# Patient Record
Sex: Male | Born: 1935
Health system: Southern US, Community
[De-identification: ages and names within clinical notes are randomized; demographics above are authoritative.]

## PROBLEM LIST (undated history)

## (undated) ENCOUNTER — Emergency Department (HOSPITAL_BASED_OUTPATIENT_CLINIC_OR_DEPARTMENT_OTHER): Admission: EM | Payer: Medicare PPO | Source: Home / Self Care

## (undated) DIAGNOSIS — E7800 Pure hypercholesterolemia, unspecified: Secondary | ICD-10-CM

## (undated) DIAGNOSIS — M8580 Other specified disorders of bone density and structure, unspecified site: Secondary | ICD-10-CM

## (undated) DIAGNOSIS — D6859 Other primary thrombophilia: Secondary | ICD-10-CM

## (undated) DIAGNOSIS — I4891 Unspecified atrial fibrillation: Secondary | ICD-10-CM

## (undated) DIAGNOSIS — M316 Other giant cell arteritis: Secondary | ICD-10-CM

## (undated) DIAGNOSIS — I712 Thoracic aortic aneurysm, without rupture, unspecified: Secondary | ICD-10-CM

## (undated) DIAGNOSIS — I700 Atherosclerosis of aorta: Secondary | ICD-10-CM

## (undated) HISTORY — PX: APPENDECTOMY: SHX54

## (undated) HISTORY — PX: HERNIA REPAIR: SHX51

## (undated) HISTORY — DX: Thoracic aortic aneurysm, without rupture: I71.2

---

## 1999-06-16 ENCOUNTER — Encounter (INDEPENDENT_AMBULATORY_CARE_PROVIDER_SITE_OTHER): Payer: Self-pay | Admitting: Specialist

## 1999-06-16 ENCOUNTER — Ambulatory Visit (HOSPITAL_COMMUNITY): Admission: RE | Admit: 1999-06-16 | Discharge: 1999-06-16 | Payer: Self-pay | Admitting: Gastroenterology

## 1999-12-22 ENCOUNTER — Emergency Department (HOSPITAL_COMMUNITY): Admission: EM | Admit: 1999-12-22 | Discharge: 1999-12-22 | Payer: Self-pay | Admitting: Emergency Medicine

## 1999-12-22 ENCOUNTER — Encounter: Payer: Self-pay | Admitting: Emergency Medicine

## 2007-11-13 ENCOUNTER — Emergency Department (HOSPITAL_COMMUNITY): Admission: EM | Admit: 2007-11-13 | Discharge: 2007-11-13 | Payer: Self-pay | Admitting: Emergency Medicine

## 2009-05-29 ENCOUNTER — Ambulatory Visit (HOSPITAL_COMMUNITY): Admission: RE | Admit: 2009-05-29 | Discharge: 2009-05-29 | Payer: Self-pay | Admitting: Cardiology

## 2010-09-17 NOTE — Op Note (Signed)
[place]. [facility]  Patient:    Nathan Mitchell, Nathan Mitchell                     MRN: [id number] Proc. Date: 12/22/99 Adm. Date:  [id number] Attending:  Sandi Raveling CC:         Nicki Reaper, M.D.   Operative Report  PREOPERATIVE DIAGNOSIS:  POSTOPERATIVE DIAGNOSIS:  OPERATION PERFORMED:  SURGEON:  Nicki Reaper, M.D.  ASSISTANT:  ANESTHESIA:  INDICATIONS FOR PROCEDURE:   The patient is a 77 year old male who suffered a laceration over the dorsal aspect of his index, middle and ring fingers of his right hand when a piece of pottery broke.  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  DESCRIPTION OF PROCEDURE:  The patient was given metacarpal block with 2% Xylocaine without epinephrine.  10 cc was used.  He was prepped and draped using Betadine solution.  Penrose drain was used for tourniquet control at the base of each finger.  The index was an avulsion of skin.  This was ____________ debridement.  The middle finger was attended to next.  There was a slight abrasion of the extensor tendon which lay in good position with approximately 50 to 60% intact.  This was irrigated.  Skin closed with interrupted 5-0 nylon sutures.  A 3-5 K-wire was placed into the ring finger after irrigation and debridement of the joint of several loose pieces of pottery.  The 3-5 K-wire was inserted pinning the distal interphalangeal joint full extension.  The extensor tendon was then repaired with figure-of-eight 5-0 M[place]il[place] sutures.  The skin was repaired with interrupted 5-0 nylon suture.  A small incision was made on the midlateral line to extend the incision for better visualization.  The entire wound was closed with interrupted 5-0 nylon.  Sterile compressive dressing and splints were applied. The patient tolerated the procedure well and was discharged home to return to the [facility] in one week on  Tylenol 3 and Keflex.  He was given a tetanus prior to discharge. DD:  12/22/99 TD:  12/23/99 Job: [id number]

## 2011-05-25 ENCOUNTER — Other Ambulatory Visit (HOSPITAL_COMMUNITY): Payer: Self-pay | Admitting: Geriatric Medicine

## 2011-05-25 DIAGNOSIS — R0600 Dyspnea, unspecified: Secondary | ICD-10-CM

## 2011-05-31 ENCOUNTER — Ambulatory Visit (HOSPITAL_COMMUNITY)
Admission: RE | Admit: 2011-05-31 | Discharge: 2011-05-31 | Disposition: A | Discharge status: Home / Self Care | Payer: Medicare Other | Source: Ambulatory Visit | Attending: Geriatric Medicine | Admitting: Geriatric Medicine

## 2011-05-31 DIAGNOSIS — R0989 Other specified symptoms and signs involving the circulatory and respiratory systems: Secondary | ICD-10-CM | POA: Insufficient documentation

## 2011-05-31 DIAGNOSIS — R0609 Other forms of dyspnea: Secondary | ICD-10-CM | POA: Insufficient documentation

## 2011-05-31 MED ORDER — ALBUTEROL SULFATE (5 MG/ML) 0.5% IN NEBU
2.5000 mg | INHALATION_SOLUTION | Freq: Once | RESPIRATORY_TRACT | Status: AC
  Administered 2011-05-31: 2.5 mg via RESPIRATORY_TRACT

## 2012-04-29 ENCOUNTER — Emergency Department (HOSPITAL_COMMUNITY): Admission: EM | Admit: 2012-04-29 | Discharge: 2012-04-29 | Payer: BC Managed Care – PPO | Source: Home / Self Care

## 2012-04-29 DIAGNOSIS — H547 Unspecified visual loss: Secondary | ICD-10-CM | POA: Insufficient documentation

## 2012-04-29 NOTE — ED Notes (Signed)
Patient came in today with verbal orders from Dr Eulah Pont at [facility] for a ESR a C-reactive protein.  I called Dr Eulah Pont.  He stated patient did not need to see a physician.  I explained to physician if he could fax me a written order patient could go directly to lab instead of seeing one of our physicians.  Order was faxed to [phone].  I then faxed order to lab at [phone] and sent patient with copy to lab.

## 2012-06-07 DIAGNOSIS — M316 Other giant cell arteritis: Secondary | ICD-10-CM | POA: Insufficient documentation

## 2012-07-03 ENCOUNTER — Other Ambulatory Visit: Payer: Self-pay | Admitting: Geriatric Medicine

## 2012-07-03 ENCOUNTER — Ambulatory Visit
Admission: RE | Admit: 2012-07-03 | Discharge: 2012-07-03 | Disposition: A | Discharge status: Home / Self Care | Payer: Medicare Other | Source: Ambulatory Visit | Attending: Geriatric Medicine | Admitting: Geriatric Medicine

## 2012-07-03 DIAGNOSIS — R609 Edema, unspecified: Secondary | ICD-10-CM

## 2013-01-15 DIAGNOSIS — E785 Hyperlipidemia, unspecified: Secondary | ICD-10-CM | POA: Insufficient documentation

## 2013-05-28 DIAGNOSIS — Z79899 Other long term (current) drug therapy: Secondary | ICD-10-CM | POA: Insufficient documentation

## 2013-06-03 DIAGNOSIS — H26499 Other secondary cataract, unspecified eye: Secondary | ICD-10-CM | POA: Insufficient documentation

## 2013-06-03 DIAGNOSIS — H47013 Ischemic optic neuropathy, bilateral: Secondary | ICD-10-CM | POA: Insufficient documentation

## 2013-07-01 ENCOUNTER — Other Ambulatory Visit (HOSPITAL_COMMUNITY): Payer: Self-pay | Admitting: Geriatric Medicine

## 2013-07-01 ENCOUNTER — Encounter: Payer: Self-pay | Admitting: Cardiovascular Disease

## 2013-07-01 ENCOUNTER — Ambulatory Visit (HOSPITAL_COMMUNITY): Payer: Medicare Other | Attending: Cardiovascular Disease | Admitting: Radiology

## 2013-07-01 DIAGNOSIS — R0602 Shortness of breath: Secondary | ICD-10-CM

## 2013-07-01 DIAGNOSIS — R0609 Other forms of dyspnea: Secondary | ICD-10-CM | POA: Insufficient documentation

## 2013-07-01 DIAGNOSIS — R0989 Other specified symptoms and signs involving the circulatory and respiratory systems: Principal | ICD-10-CM | POA: Insufficient documentation

## 2013-07-01 NOTE — Progress Notes (Signed)
Echocardiogram performed.  

## 2014-02-25 DIAGNOSIS — Z79899 Other long term (current) drug therapy: Secondary | ICD-10-CM | POA: Insufficient documentation

## 2014-07-01 DIAGNOSIS — H02005 Unspecified entropion of left lower eyelid: Secondary | ICD-10-CM | POA: Insufficient documentation

## 2015-06-07 ENCOUNTER — Emergency Department (EMERGENCY_DEPARTMENT_HOSPITAL): Admit: 2015-06-07 | Discharge: 2015-06-07 | Disposition: A | Discharge status: Home / Self Care | Payer: Medicare Other

## 2015-06-07 ENCOUNTER — Emergency Department (HOSPITAL_COMMUNITY): Payer: Medicare Other

## 2015-06-07 ENCOUNTER — Encounter (HOSPITAL_COMMUNITY): Payer: Self-pay | Admitting: Emergency Medicine

## 2015-06-07 ENCOUNTER — Emergency Department (HOSPITAL_COMMUNITY)
Admission: EM | Admit: 2015-06-07 | Discharge: 2015-06-07 | Disposition: A | Discharge status: Home / Self Care | Payer: Medicare Other | Attending: Emergency Medicine | Admitting: Emergency Medicine

## 2015-06-07 DIAGNOSIS — Z79899 Other long term (current) drug therapy: Secondary | ICD-10-CM | POA: Diagnosis not present

## 2015-06-07 DIAGNOSIS — M79609 Pain in unspecified limb: Secondary | ICD-10-CM

## 2015-06-07 DIAGNOSIS — Z87891 Personal history of nicotine dependence: Secondary | ICD-10-CM | POA: Insufficient documentation

## 2015-06-07 DIAGNOSIS — M7989 Other specified soft tissue disorders: Secondary | ICD-10-CM

## 2015-06-07 DIAGNOSIS — M79661 Pain in right lower leg: Secondary | ICD-10-CM | POA: Diagnosis not present

## 2015-06-07 DIAGNOSIS — Z7982 Long term (current) use of aspirin: Secondary | ICD-10-CM | POA: Diagnosis not present

## 2015-06-07 DIAGNOSIS — M316 Other giant cell arteritis: Secondary | ICD-10-CM | POA: Insufficient documentation

## 2015-06-07 DIAGNOSIS — M25561 Pain in right knee: Secondary | ICD-10-CM | POA: Diagnosis present

## 2015-06-07 HISTORY — DX: Other giant cell arteritis: M31.6

## 2015-06-07 MED ORDER — HYDROCODONE-ACETAMINOPHEN 5-325 MG PO TABS: 1.0000 | ORAL_TABLET | Freq: Four times a day (QID) | ORAL | Status: DC | PRN

## 2015-06-07 NOTE — ED Notes (Signed)
Patient transported to X-ray 

## 2015-06-07 NOTE — ED Notes (Signed)
Pt found fully clothes and ready to go upon DC.  Family and Pt refused DC vital signs.

## 2015-06-07 NOTE — ED Notes (Signed)
Bed: WA21 Expected date:  Expected time:  Means of arrival:  Comments: TR 8 

## 2015-06-07 NOTE — ED Notes (Addendum)
Pt with Hx of giant cell arteritis c/o right lower leg pain x several days, worse last night. No SOB, CP. Pt worried about blood clot. Positive Homan's right posterior knee.

## 2015-06-07 NOTE — Discharge Instructions (Signed)
Pain Without a Known Cause °WHAT IS PAIN WITHOUT A KNOWN CAUSE? °Pain can occur in any part of the body and can range from mild to severe. Sometimes no cause can be found for why you are having pain. Some types of pain that can occur without a known cause include:  °· Headache. °· Back pain. °· Abdominal pain. °· Neck pain. °HOW IS PAIN WITHOUT A KNOWN CAUSE DIAGNOSED?  °Your health care provider will try to find the cause of your pain. This may include: °· Physical exam. °· Medical history. °· Blood tests. °· Urine tests. °· X-rays. °If no cause is found, your health care provider may diagnose you with pain without a known cause.  °IS THERE TREATMENT FOR PAIN WITHOUT A CAUSE?  °Treatment depends on the kind of pain you have. Your health care provider may prescribe medicines to help relieve your pain.  °WHAT CAN I DO AT HOME FOR MY PAIN?  °· Take medicines only as directed by your health care provider. °· Stop any activities that cause pain. During periods of severe pain, bed rest may help. °· Try to reduce your stress with activities such as yoga or meditation. Talk to your health care provider for other stress-reducing activity recommendations. °· Exercise regularly, if approved by your health care provider. °· Eat a healthy diet that includes fruits and vegetables. This may improve pain. Talk to your health care provider if you have any questions about your diet. °WHAT IF MY PAIN DOES NOT GET BETTER?  °If you have a painful condition and no reason can be found for the pain or the pain gets worse, it is important to follow up with your health care provider. It may be necessary to repeat tests and look further for a possible cause.  °  °This information is not intended to replace advice given to you by your health care provider. Make sure you discuss any questions you have with your health care provider. °  °Document Released: 01/11/2001 Document Revised: 05/09/2014 Document Reviewed: 09/03/2013 °Elsevier  Interactive Patient Education ©2016 [employer]vier Inc. ° °

## 2015-06-07 NOTE — ED Provider Notes (Signed)
CSN: [id number]     Arrival date & time 06/07/15  1619 History   First MD Initiated Contact with Patient 06/07/15 1809     Chief Complaint  Patient presents with  . Leg Pain     (Consider location/radiation/quality/duration/timing/severity/associated sxs/prior Treatment) Patient is a 82 y.o. male presenting with leg pain. The history is provided by the patient.  Leg Pain Location:  Knee and leg Time since incident:  3 days Injury: no   Leg location:  R lower leg Knee location:  R knee Pain details:    Quality:  Aching, shooting and throbbing   Radiates to:  Does not radiate   Severity:  Moderate   Onset quality:  Gradual   Duration:  3 days   Timing:  Constant   Progression:  Worsening Chronicity:  New Prior injury to area:  No Relieved by:  None tried Worsened by:  Bearing weight Ineffective treatments:  None tried Associated symptoms: no fatigue, no fever, no numbness and no swelling   Risk factors comment:  History of giant cell arteritis. Currently taking methotrexate but no longer on prednisone as of the last month   Past Medical History  Diagnosis Date  . Giant cell arteritis (HCC)    History reviewed. No pertinent past surgical history. History reviewed. No pertinent family history. Social History  Substance Use Topics  . Smoking status: Former Games developer  . Smokeless tobacco: None  . Alcohol Use: 1.8 oz/week    3 Glasses of wine per week    Review of Systems  Constitutional: Negative for fever and fatigue.  All other systems reviewed and are negative.     Allergies  Review of patient's allergies indicates no known allergies.  Home Medications   Prior to Admission medications   Medication Sig Start Date End Date Taking? Authorizing Provider  aspirin EC 81 MG tablet Take 81 mg by mouth daily.   Yes Historical Provider, MD  atorvastatin (LIPITOR) 10 MG tablet Take 10 mg by mouth daily.   Yes Historical Provider, MD  Cholecalciferol (VITAMIN D-1000 MAX  ST) 1000 units tablet Take 1,000 Units by mouth daily.   Yes Historical Provider, MD  cyanocobalamin ([employer] VITAMIN B12) 2000 MCG tablet Take 2,000 mcg by mouth daily.   Yes Historical Provider, MD  escitalopram (LEXAPRO) 20 MG tablet Take 20 mg by mouth daily.   Yes Historical Provider, MD  ferrous sulfate 325 (65 FE) MG tablet Take 325 mg by mouth 3 (three) times a week. Monday, Wednesday, Friday   Yes Historical Provider, MD  folic acid (FOLVITE) 800 MCG tablet Take 400 mcg by mouth daily. 12/02/14  Yes Historical Provider, MD  methotrexate (RHEUMATREX) 2.5 MG tablet Take 20 mg by mouth once a week. Friday 04/24/15  Yes Historical Provider, MD  omeprazole (PRILOSEC) 20 MG capsule Take 20 mg by mouth daily.   Yes Historical Provider, MD  HYDROcodone-acetaminophen (NORCO/VICODIN) 5-325 MG tablet Take 1 tablet by mouth every 6 (six) hours as needed. 06/07/15   Gwyneth Sprout, MD   BP 145/80 mmHg  Pulse 69  Temp(Src) 97.4 F (36.3 C) (Oral)  Resp 16  SpO2 96% Physical Exam  Constitutional: He is oriented to person, place, and time. He appears well-developed and well-nourished. No distress.  HENT:  Head: Normocephalic and atraumatic.  Cardiovascular: Normal rate and intact distal pulses.   Pulmonary/Chest: Effort normal.  Musculoskeletal: Normal range of motion. He exhibits tenderness. He exhibits no edema.       Right knee: He  exhibits normal range of motion, no swelling, no effusion and no erythema.       Legs: Neurological: He is alert and oriented to person, place, and time.  Skin: Skin is warm and dry. No rash noted. No erythema.  Psychiatric: He has a normal mood and affect. His behavior is normal.  Nursing note and vitals reviewed.   ED Course  Procedures (including critical care time) Labs Review Labs Reviewed - No data to display  Imaging Review Dg Knee Complete 4 Views Right  06/07/2015  CLINICAL DATA:  Right knee pain for several days.  Worsening today. EXAM: RIGHT KNEE -  COMPLETE 4+ VIEW COMPARISON:  None. FINDINGS: There is no evidence of fracture, dislocation, or joint effusion. 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 There is peripheral vascular atherosclerotic disease. Soft tissues are unremarkable. IMPRESSION: No acute osseous injury of the right knee. Electronically Signed   By: Elige Ko   On: 06/07/2015 20:16   I have personally reviewed and evaluated these images and lab results as part of my medical decision-making.  PRELIMINARY PRELIMINARY PRELIMINARY PRELIMINARY  Right lower extremity venous duplex completed.   Preliminary report: There is no acute DVT or SVT noted in the right lower extremity. There is a large hypoechoic area noted in the proximal to mid calf, possibly consistent with a muscle tear.   MDM   Final diagnoses:  Right calf pain    Patient presenting with worsening pain in the right proximal calf and knee that started 3 days ago. Patient has a history of giant cell arteritis and is currently on methotrexate. He denies any recent fevers, redness or drainage from the leg. No prior history of DVT or recent immobilization.  On exam patient has no dilated veins or cords palpated. No induration or concern for abscess. Full range of motion of the knee without evidence of a septic joint.  X-rays within normal limits. Venous duplex to rule out DVT is negative for DVT but shows subcutaneous fluid and signs consistent with a muscle tear. Patient cannot recall any injury however he has been on long-term prednisone and may have done something minor that caused the issue.    Gwyneth Sprout, MD 06/07/15 2136

## 2015-06-07 NOTE — Progress Notes (Signed)
VASCULAR LAB PRELIMINARY  PRELIMINARY  PRELIMINARY  PRELIMINARY  Right lower extremity venous duplex completed.    Preliminary report:  There is no acute DVT or SVT noted in the right lower extremity.  There is a large hypoechoic area noted in the proximal to mid calf, possibly consistent with a muscle tear.   Tanessa Tidd, RVT 06/07/2015, 7:22 PM

## 2015-07-28 ENCOUNTER — Other Ambulatory Visit: Payer: Self-pay | Admitting: Geriatric Medicine

## 2015-07-28 ENCOUNTER — Ambulatory Visit
Admission: RE | Admit: 2015-07-28 | Discharge: 2015-07-28 | Disposition: A | Discharge status: Home / Self Care | Payer: Medicare Other | Source: Ambulatory Visit | Attending: Geriatric Medicine | Admitting: Geriatric Medicine

## 2015-07-28 DIAGNOSIS — J440 Chronic obstructive pulmonary disease with acute lower respiratory infection: Principal | ICD-10-CM

## 2015-07-28 DIAGNOSIS — J209 Acute bronchitis, unspecified: Secondary | ICD-10-CM

## 2017-12-16 ENCOUNTER — Other Ambulatory Visit: Payer: Self-pay

## 2017-12-16 ENCOUNTER — Encounter (HOSPITAL_COMMUNITY): Payer: Self-pay | Admitting: Emergency Medicine

## 2017-12-16 ENCOUNTER — Emergency Department (HOSPITAL_COMMUNITY)
Admission: EM | Admit: 2017-12-16 | Discharge: 2017-12-16 | Disposition: A | Discharge status: Home / Self Care | Payer: Medicare Other | Attending: Emergency Medicine | Admitting: Emergency Medicine

## 2017-12-16 DIAGNOSIS — I4891 Unspecified atrial fibrillation: Secondary | ICD-10-CM | POA: Insufficient documentation

## 2017-12-16 DIAGNOSIS — Z87891 Personal history of nicotine dependence: Secondary | ICD-10-CM | POA: Insufficient documentation

## 2017-12-16 DIAGNOSIS — Z79899 Other long term (current) drug therapy: Secondary | ICD-10-CM | POA: Insufficient documentation

## 2017-12-16 DIAGNOSIS — T50901A Poisoning by unspecified drugs, medicaments and biological substances, accidental (unintentional), initial encounter: Secondary | ICD-10-CM

## 2017-12-16 LAB — CBC
HCT: 41.2 % (ref 39.0–52.0)
Hemoglobin: 12.8 g/dL — ABNORMAL LOW (ref 13.0–17.0)
MCH: 30.8 pg (ref 26.0–34.0)
MCHC: 31.1 g/dL (ref 30.0–36.0)
MCV: 99.3 fL (ref 78.0–100.0)
Platelets: 213 10*3/uL (ref 150–400)
RBC: 4.15 MIL/uL — ABNORMAL LOW (ref 4.22–5.81)
RDW: 15.8 % — ABNORMAL HIGH (ref 11.5–15.5)
WBC: 5.7 10*3/uL (ref 4.0–10.5)

## 2017-12-16 LAB — COMPREHENSIVE METABOLIC PANEL
ALT: 16 U/L (ref 0–44)
AST: 15 U/L (ref 15–41)
Albumin: 3.8 g/dL (ref 3.5–5.0)
Alkaline Phosphatase: 44 U/L (ref 38–126)
Anion gap: 6 (ref 5–15)
BUN: 19 mg/dL (ref 8–23)
CO2: 27 mmol/L (ref 22–32)
Calcium: 9 mg/dL (ref 8.9–10.3)
Chloride: 108 mmol/L (ref 98–111)
Creatinine, Ser: 1.38 mg/dL — ABNORMAL HIGH (ref 0.61–1.24)
GFR calc Af Amer: 52 mL/min — ABNORMAL LOW (ref >60)
GFR calc non Af Amer: 45 mL/min — ABNORMAL LOW (ref >60)
Glucose, Bld: 127 mg/dL — ABNORMAL HIGH (ref 70–99)
Potassium: 4.3 mmol/L (ref 3.5–5.1)
Sodium: 141 mmol/L (ref 135–145)
Total Bilirubin: 1 mg/dL (ref 0.3–1.2)
Total Protein: 6.6 g/dL (ref 6.5–8.1)

## 2017-12-16 LAB — CBG MONITORING, ED: Glucose-Capillary: 73 mg/dL (ref 70–99)

## 2017-12-16 LAB — SALICYLATE LEVEL: Salicylate Lvl: <7 mg/dL (ref 2.8–30.0)

## 2017-12-16 LAB — ACETAMINOPHEN LEVEL: Acetaminophen (Tylenol), Serum: <10 ug/mL — ABNORMAL LOW (ref 10–30)

## 2017-12-16 LAB — RAPID URINE DRUG SCREEN, HOSP PERFORMED
Amphetamines: NOT DETECTED
Barbiturates: NOT DETECTED
Benzodiazepines: NOT DETECTED
Cocaine: NOT DETECTED
Opiates: NOT DETECTED
Tetrahydrocannabinol: NOT DETECTED

## 2017-12-16 LAB — ETHANOL: Alcohol, Ethyl (B): <10 mg/dL (ref <10)

## 2017-12-16 LAB — METHOTREXATE: Methotrexate: 1.5

## 2017-12-16 MED ORDER — RIVAROXABAN 20 MG PO TABS: 20.0000 mg | ORAL_TABLET | Freq: Every day | ORAL | 0 refills | Status: DC

## 2017-12-16 MED ORDER — LEUCOVORIN CALCIUM INJECTION 200 MG
50.0000 mg | Freq: Once | INTRAMUSCULAR | Status: AC
  Administered 2017-12-16: 50 mg via INTRAVENOUS
  Filled 2017-12-16: qty 2.5

## 2017-12-16 MED ORDER — SODIUM CHLORIDE 0.9 % IV BOLUS
1000.0000 mL | Freq: Once | INTRAVENOUS | Status: AC
  Administered 2017-12-16: 1000 mL via INTRAVENOUS

## 2017-12-16 MED ORDER — ONDANSETRON 4 MG PO TBDP
4.0000 mg | ORAL_TABLET | Freq: Once | ORAL | Status: AC
  Administered 2017-12-16: 4 mg via ORAL
  Filled 2017-12-16: qty 1

## 2017-12-16 MED ORDER — RIVAROXABAN 20 MG PO TABS
20.0000 mg | ORAL_TABLET | Freq: Once | ORAL | Status: AC
  Administered 2017-12-16: 20 mg via ORAL
  Filled 2017-12-16: qty 1

## 2017-12-16 MED ORDER — LEUCOVORIN CALCIUM INJECTION 350 MG: 100.0000 mg/m2 | Freq: Once | INTRAVENOUS | Status: DC

## 2017-12-16 MED ORDER — CHARCOAL ACTIVATED PO LIQD
50.0000 g | Freq: Once | ORAL | Status: AC
  Administered 2017-12-16: 50 g via ORAL
  Filled 2017-12-16: qty 240

## 2017-12-16 NOTE — ED Triage Notes (Signed)
Poison Control called regarding patient coming in POV after accidentally taking twice as much of his normal weekly methotrexate dose today at 9:15am. They reported that a new health aide filled his medication container, putting 16 tablets instead of 8 tablets per his usual. Their MD recommended EKG and methotrexate level be done as well as need for specific medications. Will consult with provider r/e medications when patient moved to treatment room.

## 2017-12-16 NOTE — ED Notes (Signed)
Gave pt urine specimen pack and he is now gone to restroom to fill

## 2017-12-16 NOTE — ED Notes (Signed)
Pt CBG 73.

## 2017-12-16 NOTE — ED Notes (Signed)
D/c reviewed with patient and daughter. PT verbalized understanding

## 2017-12-16 NOTE — ED Provider Notes (Signed)
[facility]   CSN: [id number] Arrival date & time: 12/16/17  1032     History   Chief Complaint Chief Complaint  Patient presents with  . Drug Overdose    HPI Nathan Mitchell is a 84 y.o. male with history of giant cell arteritis on weekly methotrexate who presents following accidental methotrexate overdose. He and family report that a new tech prepared his medications and her took 16 tablets instead of 8. His wife called poison control who advised EKG and methotrexate level. Patient is asymptomatic. 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  He denies any chest pain, shortness of breath, abdominal pain, nausea, vomiting, urinary symptoms.  HPI  Past Medical History:  Diagnosis Date  . Giant cell arteritis (HCC)     There are no active problems to display for this patient.   History reviewed. No pertinent surgical history.      Home Medications    Prior to Admission medications   Medication Sig Start Date End Date Taking? Authorizing Provider  atorvastatin (LIPITOR) 10 MG tablet Take 10 mg by mouth daily.   Yes [provider]  cholecalciferol (VITAMIN D) 1000 units tablet Take 1,000 Units by mouth daily.   Yes [provider]  escitalopram (LEXAPRO) 20 MG tablet Take 20 mg by mouth daily.   Yes [provider]  ferrous gluconate (FERGON) 240 (27 FE) MG tablet Take 240 mg by mouth every Monday, Wednesday, and Friday.   Yes [provider]  folic acid (FOLVITE) 400 MCG tablet Take 400 mcg by mouth daily.  12/02/14  Yes [provider]  methotrexate (RHEUMATREX) 2.5 MG tablet Take 20 mg by mouth every Saturday.  04/24/15  Yes [provider]  Omeprazole 20 MG TBEC Take 20 mg by mouth daily.    Yes [provider]  vitamin B-12 (CYANOCOBALAMIN) 1000 MCG tablet Take 1,000 mcg by mouth daily.   Yes [provider]  HYDROcodone-acetaminophen  (NORCO/VICODIN) 5-325 MG tablet Take 1 tablet by mouth every 6 (six) hours as needed. Patient not taking: Reported on 12/16/2017 06/07/15   Gwyneth SproutPlunkett, Whitney, MD  rivaroxaban (XARELTO) 20 MG TABS tablet Take 1 tablet (20 mg total) by mouth daily with supper. 12/16/17   Emi HolesLaw, Bion Todorov M, PA-C    Family History No family history on file.  Social History Social History   Tobacco Use  . Smoking status: Former Smoker  Substance Use Topics  . Alcohol use: Yes    Alcohol/week: 3.0 standard drinks    Types: 3 Glasses of wine per week  . Drug use: No     Allergies   Patient has no known allergies.   Review of Systems Review of Systems  Constitutional: Negative for chills and fever.  HENT: Negative for facial swelling and sore throat.   Respiratory: Negative for shortness of breath.   Cardiovascular: Negative for chest pain.  Gastrointestinal: Negative for abdominal pain, nausea and vomiting.  Genitourinary: Negative for dysuria.  Musculoskeletal: Negative for back pain.  Skin: Negative for rash and wound.  Neurological: Negative for headaches.  Psychiatric/Behavioral: The patient is not nervous/anxious.      Physical Exam Updated Vital Signs BP (!) 153/88   Pulse 61   Temp 97.7 F (36.5 C) (Oral)   Resp 19   Ht 6\' 2"  (1.88 m)   Wt 89 kg   SpO2 96%   BMI 25.19 kg/m   Physical Exam  Constitutional: He appears  well-developed and well-nourished. No distress.  HENT:  Head: Normocephalic and atraumatic.  Mouth/Throat: Oropharynx is clear and moist. No oropharyngeal exudate.  Eyes: Pupils are equal, round, and reactive to light. Conjunctivae are normal. Right eye exhibits no discharge. Left eye exhibits no discharge. No scleral icterus.  Neck: Normal range of motion. Neck supple. No thyromegaly present.  Cardiovascular: Normal rate, regular rhythm, normal heart sounds and intact distal pulses. Exam reveals no gallop and no friction rub.  No murmur heard. Pulmonary/Chest:  Effort normal and breath sounds normal. No stridor. No respiratory distress. He has no wheezes. He has no rales.  Abdominal: Soft. Bowel sounds are normal. He exhibits no distension. There is no tenderness. There is no rebound and no guarding.  Musculoskeletal: He exhibits no edema.  Lymphadenopathy:    He has no cervical adenopathy.  Neurological: He is alert. Coordination normal.  Skin: Skin is warm and dry. No rash noted. He is not diaphoretic. No pallor.  Psychiatric: He has a normal mood and affect.  Nursing note and vitals reviewed.    ED Treatments / Results  Labs (all labs ordered are listed, but only abnormal results are displayed) Labs Reviewed  COMPREHENSIVE METABOLIC PANEL - Abnormal; Notable for the following components:      Result Value   Glucose, Bld 127 (*)    Creatinine, Ser 1.38 (*)    GFR calc non Af Amer 45 (*)    GFR calc Af Amer 52 (*)    All other components within normal limits  ACETAMINOPHEN LEVEL - Abnormal; Notable for the following components:   Acetaminophen (Tylenol), Serum <10 (*)    All other components within normal limits  CBC - Abnormal; Notable for the following components:   RBC 4.15 (*)    Hemoglobin 12.8 (*)    RDW 15.8 (*)    All other components within normal limits  ETHANOL  SALICYLATE LEVEL  RAPID URINE DRUG SCREEN, HOSP PERFORMED  METHOTREXATE  CBG MONITORING, ED    EKG EKG Interpretation  Date/Time:  Saturday December 16 2017 13:27:16 EDT Ventricular Rate:  74 PR Interval:    QRS Duration: 158 QT Interval:  482 QTC Calculation: 535 R Axis:   40 Text Interpretation:  Atrial fibrillation Right bundle branch block simialr to earlier today Confirmed by Meridee ScoreButler, Michael [phone]([id number]) on 12/16/2017 1:33:16 PM   Radiology No results found.  Procedures Procedures (including critical care time)  Medications Ordered in ED Medications  charcoal activated (NO SORBITOL) (ACTIDOSE-AQUA) suspension 50 g (50 g Oral Given 12/16/17 1312)    sodium chloride 0.9 % bolus 1,000 mL (0 mLs Intravenous Stopped 12/16/17 1438)  leucovorin calcium injection 50 mg (50 mg Intravenous Given 12/16/17 1408)  ondansetron (ZOFRAN-ODT) disintegrating tablet 4 mg (4 mg Oral Given 12/16/17 1323)  rivaroxaban (XARELTO) tablet 20 mg (20 mg Oral Given 12/16/17 1549)     Initial Impression / Assessment and Plan / ED Course  I have reviewed the triage vital signs and the nursing notes.  Pertinent labs & imaging results that were available during my care of the patient were reviewed by me and considered in my medical decision making (see chart for details).     Patient asymptomatic after accidental methotrexate overdose, taking 40 mg instead of 20mg .  Hemoglobin 12.8.  Creatinine 1.38.  EKG shows atrial fibrillation.  Patient has no known history.  He has no chest pain or shortness of breath.  He has no symptoms and states he feels normal.  Regarding patient's  overdose, I discussed patient case with poison control and pharmacist, Harrold Donath, at length who advised to activated charcoal and leucovorin, as well as basic labs and EKG.  These medications given to the patient.  Methotrexate level is pending and is a send out test and will not return during patient's stay.  Patient will be advised strict return precautions if he develops any GI symptoms or other new symptoms.  Patient advised to follow-up with his doctor on Monday to follow-up with this level and to manage the patient further.  Regarding patient's new atrial fibrillation with chads vasc score of 2, I discussed patient case with cardiologist on-call, Dr. Darl Householder, who advised outpatient follow-up with cardiology and starting the patient on Xarelto 20 mg daily.  Patient and daughter understand and agree with plan.  Patient vitals stable throughout ED course and discharged in satisfactory condition.  Final Clinical Impressions(s) / ED Diagnoses   Final diagnoses:  Accidental drug overdose, initial  encounter  Atrial fibrillation, unspecified type [facility])    ED Discharge Orders         Ordered    rivaroxaban (XARELTO) 20 MG TABS tablet  Daily with supper     12/16/17 [address], PA-C 12/16/17 1556    Terrilee Files, MD 12/16/17 1840

## 2017-12-16 NOTE — Discharge Instructions (Addendum)
Begin taking Xarelto as prescribed until told otherwise by your doctor.  Do not take aspirin or NSAIDs such as ibuprofen, Advil, Naprosyn, Aleve with this medication.  Your atrial fibrillation may resolve on its own.  Please follow-up with your doctor on Monday to follow-up your methotrexate level and dictate your further management.  Please return the emergency department or see her doctor immediately if you develop any new or worsening symptoms.

## 2017-12-16 NOTE — ED Notes (Signed)
Dr. Charm BargesButler made aware of patient and recommendations by poison control.

## 2017-12-16 NOTE — ED Notes (Signed)
Need u/a re-collected per lab.  Had wrong patient label on cup.

## 2017-12-16 NOTE — ED Triage Notes (Signed)
Pt reports taking 16 methotrexate 30 mins ago, typically takes 8 instead of 16, states that someone set his pills out and he did not realize there was double the dose. Denies any symptoms at this time. A/ox4. Poison controlled notified by patient's wife pta, poison controll contacted ED staff and advised pt needs methotrexate level and ekg.

## 2017-12-16 NOTE — ED Notes (Signed)
Pt wt 197.3 Ib

## 2017-12-18 ENCOUNTER — Telehealth (HOSPITAL_COMMUNITY): Payer: Self-pay | Admitting: *Deleted

## 2017-12-18 NOTE — Telephone Encounter (Signed)
Cld to offer pt follow up appt on new onset afib in the ED.  Pt wife stated they will not schedule as of yet because they are seeing Hal [facility] tomorrow and will come if he refers them to us.  She voiced some concern over seeing a NP for the patient instead of a cards MD.  She stated she would be in contact with us tomorrow or someone from [facility] would

## 2017-12-21 ENCOUNTER — Other Ambulatory Visit (HOSPITAL_COMMUNITY): Payer: Self-pay | Admitting: Geriatric Medicine

## 2017-12-21 DIAGNOSIS — I482 Chronic atrial fibrillation, unspecified: Secondary | ICD-10-CM

## 2017-12-22 ENCOUNTER — Other Ambulatory Visit: Payer: Self-pay

## 2017-12-22 ENCOUNTER — Ambulatory Visit (HOSPITAL_COMMUNITY): Payer: Medicare Other | Attending: Cardiology

## 2017-12-22 DIAGNOSIS — I083 Combined rheumatic disorders of mitral, aortic and tricuspid valves: Secondary | ICD-10-CM | POA: Insufficient documentation

## 2017-12-22 DIAGNOSIS — I482 Chronic atrial fibrillation, unspecified: Secondary | ICD-10-CM

## 2018-01-24 ENCOUNTER — Encounter (HOSPITAL_COMMUNITY): Payer: Self-pay | Admitting: Nurse Practitioner

## 2018-01-24 ENCOUNTER — Ambulatory Visit (HOSPITAL_COMMUNITY)
Admission: RE | Admit: 2018-01-24 | Discharge: 2018-01-24 | Disposition: A | Discharge status: Home / Self Care | Payer: Medicare Other | Source: Ambulatory Visit | Attending: Nurse Practitioner | Admitting: Nurse Practitioner

## 2018-01-24 VITALS — BP 126/74 | HR 77 | Ht 74.0 in | Wt 202.0 lb

## 2018-01-24 DIAGNOSIS — I4891 Unspecified atrial fibrillation: Secondary | ICD-10-CM | POA: Insufficient documentation

## 2018-01-24 DIAGNOSIS — Z79899 Other long term (current) drug therapy: Secondary | ICD-10-CM | POA: Insufficient documentation

## 2018-01-24 DIAGNOSIS — I083 Combined rheumatic disorders of mitral, aortic and tricuspid valves: Secondary | ICD-10-CM | POA: Insufficient documentation

## 2018-01-24 DIAGNOSIS — I4819 Other persistent atrial fibrillation: Secondary | ICD-10-CM

## 2018-01-24 DIAGNOSIS — Z7901 Long term (current) use of anticoagulants: Secondary | ICD-10-CM | POA: Diagnosis not present

## 2018-01-24 DIAGNOSIS — I4510 Unspecified right bundle-branch block: Secondary | ICD-10-CM | POA: Diagnosis not present

## 2018-01-24 DIAGNOSIS — I481 Persistent atrial fibrillation: Secondary | ICD-10-CM | POA: Diagnosis not present

## 2018-01-24 DIAGNOSIS — Z87891 Personal history of nicotine dependence: Secondary | ICD-10-CM | POA: Insufficient documentation

## 2018-01-25 NOTE — Progress Notes (Signed)
Primary Care Physician: Merlene Laughter, MD Referring Physician: Same   Nathan Mitchell is a 84 y.o. male with a h/o giant cell arteritis, who was in there recently for accidental overdose of methotrexate and found incidentally to be in rate controlled afib. Pt was started on xarelto 20 mg for chadsvasc score of 2. HE was given charccaol and leucovorin for his accidental overdose with no long term issues.   He is in the afib clinic to further discuss. Pt does  not think he is in afib, states he feels no different that he has in years. Thinks this is a lot to do about nothing. Had a monitor placed by Dr. Larina Bras king which showed persistent afib, reasonable rate control with an average HR of 81 bpm, HR in the range of 48-169 bpm. Dr. Pete Glatter was hesitate of starting rate control, for his slow v response at times and I agree as he is not symptomatic with either end of the spectrum. He states no issues with the start of the blood thinner.   Today, he denies symptoms of palpitations, chest pain, shortness of breath, orthopnea, PND, lower extremity edema, dizziness, presyncope, syncope, or neurologic sequela. The patient is tolerating medications without difficulties and is otherwise without complaint today.   Past Medical History:  Diagnosis Date  . Giant cell arteritis (HCC)    No past surgical history on file.  Current Outpatient Medications  Medication Sig Dispense Refill  . atorvastatin (LIPITOR) 10 MG tablet Take 10 mg by mouth daily.    . cholecalciferol (VITAMIN D) 1000 units tablet Take 1,000 Units by mouth daily.    Marland Kitchen escitalopram (LEXAPRO) 20 MG tablet Take 20 mg by mouth daily.    . ferrous gluconate (FERGON) 240 (27 FE) MG tablet Take 240 mg by mouth every Monday, Wednesday, and Friday.    . folic acid (FOLVITE) 400 MCG tablet Take 400 mcg by mouth daily.     Marland Kitchen HYDROcodone-acetaminophen (NORCO/VICODIN) 5-325 MG tablet Take 1 tablet by mouth every 6 (six) hours as needed. 15  tablet 0  . methotrexate (RHEUMATREX) 2.5 MG tablet Take 20 mg by mouth every Saturday.     . Omeprazole 20 MG TBEC Take 20 mg by mouth daily.     . rivaroxaban (XARELTO) 20 MG TABS tablet Take 1 tablet (20 mg total) by mouth daily with supper. 30 tablet 0  . vitamin B-12 (CYANOCOBALAMIN) 1000 MCG tablet Take 1,000 mcg by mouth daily.     No current facility-administered medications for this encounter.     No Known Allergies  Social History   Socioeconomic History  . Marital status: Married    Spouse name: Not on file  . Number of children: Not on file  . Years of education: Not on file  . Highest education level: Not on file  Occupational History  . Not on file  Social Needs  . Financial resource strain: Not on file  . Food insecurity:    Worry: Not on file    Inability: Not on file  . Transportation needs:    Medical: Not on file    Non-medical: Not on file  Tobacco Use  . Smoking status: Former Games developer  . Smokeless tobacco: Never Used  Substance and Sexual Activity  . Alcohol use: Yes    Alcohol/week: 3.0 standard drinks    Types: 3 Glasses of wine per week  . Drug use: No  . Sexual activity: Not on file  Lifestyle  .  Physical activity:    Days per week: Not on file    Minutes per session: Not on file  . Stress: Not on file  Relationships  . Social connections:    Talks on phone: Not on file    Gets together: Not on file    Attends religious service: Not on file    Active member of club or organization: Not on file    Attends meetings of clubs or organizations: Not on file    Relationship status: Not on file  . Intimate partner violence:    Fear of current or ex partner: Not on file    Emotionally abused: Not on file    Physically abused: Not on file    Forced sexual activity: Not on file  Other Topics Concern  . Not on file  Social History Narrative  . Not on file    No family history on file.  ROS- All systems are reviewed and negative except as per  the HPI above  Physical Exam: Vitals:   01/24/18 0935  BP: 126/74  Pulse: 77  Weight: 91.6 kg  Height: 6\' 2"  (1.88 m)   Wt Readings from Last 3 Encounters:  01/24/18 91.6 kg  12/16/17 89 kg    Labs: Lab Results  Component Value Date   NA 141 12/16/2017   K 4.3 12/16/2017   CL 108 12/16/2017   CO2 27 12/16/2017   GLUCOSE 127 (H) 12/16/2017   BUN 19 12/16/2017   CREATININE 1.38 (H) 12/16/2017   CALCIUM 9.0 12/16/2017   No results found for: INR No results found for: CHOL, HDL, LDLCALC, TRIG   GEN- The patient is well appearing, alert and oriented x 3 today.   Head- normocephalic, atraumatic Eyes-  Sclera clear, conjunctiva pink Ears- hearing intact Oropharynx- clear Neck- supple, no JVP Lymph- no cervical lymphadenopathy Lungs- Clear to ausculation bilaterally, normal work of breathing Heart- Regular rate and rhythm, no murmurs, rubs or gallops, PMI not laterally displaced GI- soft, NT, ND, + BS Extremities- no clubbing, cyanosis, or edema MS- no significant deformity or atrophy Skin- no rash or lesion Psych- euthymic mood, full affect Neuro- strength and sensation are intact  EKG-afib at 77 bpm, RBBB Echo-Study Conclusions  - Left ventricle: The cavity size was normal. There was moderate   concentric hypertrophy. Systolic function was vigorous. The   estimated ejection fraction was in the range of 65% to 70%. Wall   motion was normal; there were no regional wall motion   abnormalities. The study was not technically sufficient to allow   evaluation of LV diastolic dysfunction due to atrial   fibrillation. - Aortic valve: There was moderate regurgitation. - Ascending aorta: The ascending aorta was moderately dilated   measuring 47 mm. - Mitral valve: There was mild regurgitation. - Left atrium: The atrium was moderately dilated. - Right ventricle: The cavity size was normal. Wall thickness was   normal. Systolic function was normal. - Right atrium: The  atrium was moderately dilated. - Tricuspid valve: There was mild regurgitation. - Pulmonary arteries: Systolic pressure was within the normal   range. - Pericardium, extracardiac: There was no pericardial effusion.  Impressions:  - Ascending aorta is moderately dilated measuring 47 mm, previously   45 mm in 07/01/2013.  Monitor reviewed from Dr. Laverle Hobby office  Assessment and Plan: 1. New onset of Afib, but exact onset unknown Pt is completely asymptomatic and does not understand all the fuss about this I do not believe rate  control is needed at this time as pt is rate controlled, not symptomatic and may worsen his occasional bradycardia I do not believe  cardioversion is indicated as he is asymptomatic and with left atrium size of 50 may be very difficult to restore SR long term  2. CHA2DS2VASc score is 2(age) He is appropriately dosed on 20 mg xarelto with a crcl cal at 74.86 Bleeding precautions discussed   I will see him back in 3-4 weeks and repeat a cbc/bmet If he is still asymptomatic and has good rate control, will leave approach  unchanged  but refer him on to establish with  general cardiology for afib as well as abnormalities on echo  Lupita Leash C. [facility] [address] [place], [place] [address] [phone]

## 2018-02-26 ENCOUNTER — Ambulatory Visit (HOSPITAL_COMMUNITY)
Admission: RE | Admit: 2018-02-26 | Discharge: 2018-02-26 | Disposition: A | Discharge status: Home / Self Care | Payer: Medicare Other | Source: Ambulatory Visit | Attending: Nurse Practitioner | Admitting: Nurse Practitioner

## 2018-02-26 ENCOUNTER — Encounter (HOSPITAL_COMMUNITY): Payer: Self-pay | Admitting: Nurse Practitioner

## 2018-02-26 VITALS — BP 120/74 | HR 74 | Ht 74.0 in | Wt 203.0 lb

## 2018-02-26 DIAGNOSIS — Z7901 Long term (current) use of anticoagulants: Secondary | ICD-10-CM | POA: Diagnosis not present

## 2018-02-26 DIAGNOSIS — M316 Other giant cell arteritis: Secondary | ICD-10-CM | POA: Insufficient documentation

## 2018-02-26 DIAGNOSIS — Z87891 Personal history of nicotine dependence: Secondary | ICD-10-CM | POA: Insufficient documentation

## 2018-02-26 DIAGNOSIS — I4891 Unspecified atrial fibrillation: Secondary | ICD-10-CM | POA: Diagnosis not present

## 2018-02-26 DIAGNOSIS — Z79899 Other long term (current) drug therapy: Secondary | ICD-10-CM | POA: Insufficient documentation

## 2018-02-26 DIAGNOSIS — I4811 Longstanding persistent atrial fibrillation: Secondary | ICD-10-CM

## 2018-02-26 DIAGNOSIS — I4819 Other persistent atrial fibrillation: Secondary | ICD-10-CM | POA: Diagnosis not present

## 2018-02-26 LAB — CBC
HCT: 38.5 % — ABNORMAL LOW (ref 39.0–52.0)
Hemoglobin: 12 g/dL — ABNORMAL LOW (ref 13.0–17.0)
MCH: 30.2 pg (ref 26.0–34.0)
MCHC: 31.2 g/dL (ref 30.0–36.0)
MCV: 96.7 fL (ref 80.0–100.0)
Platelets: 215 10*3/uL (ref 150–400)
RBC: 3.98 MIL/uL — ABNORMAL LOW (ref 4.22–5.81)
RDW: 15.3 % (ref 11.5–15.5)
WBC: 5 10*3/uL (ref 4.0–10.5)
nRBC: 0 % (ref 0.0–0.2)

## 2018-02-26 LAB — BASIC METABOLIC PANEL
Anion gap: 6 (ref 5–15)
BUN: 16 mg/dL (ref 8–23)
CO2: 27 mmol/L (ref 22–32)
Calcium: 8.8 mg/dL — ABNORMAL LOW (ref 8.9–10.3)
Chloride: 109 mmol/L (ref 98–111)
Creatinine, Ser: 1.04 mg/dL (ref 0.61–1.24)
GFR calc Af Amer: >60 mL/min (ref >60)
GFR calc non Af Amer: >60 mL/min (ref >60)
Glucose, Bld: 100 mg/dL — ABNORMAL HIGH (ref 70–99)
Potassium: 3.8 mmol/L (ref 3.5–5.1)
Sodium: 142 mmol/L (ref 135–145)

## 2018-02-26 NOTE — Progress Notes (Addendum)
Primary Care Physician: Merlene Laughter, MD Referring Physician: Same   Nathan Mitchell is a 84 y.o. male with a h/o giant cell arteritis, who was in the ER recently for accidental overdose of methotrexate and found incidentally to be in rate controlled afib. Pt was started on xarelto 20 mg for chadsvasc score of 2. He was given charccaol and leucovorin for his accidental overdose with no long term issues.   He is in the afib clinic to further discuss. Pt does  not think he is in afib, states he feels no different that he has in years. Thinks this is a lot to do about nothing. Had a monitor placed by Dr. Larina Bras king which showed persistent afib, reasonable rate control with an average HR of 81 bpm, HR in the range of 48-169 bpm. Dr. Pete Glatter was hesitate of starting rate control, for his slow v response at times and I agree as he is not symptomatic with either end of the spectrum. He states no issues with the start of the blood thinner.   F/u in afib clinic, 10/28. He remains asymptomatic. He is rate controlled. No issues with bleeding since starting anticoagulation. His wife states that he is drinking wine more than she  would like. Advised no  more than 2 drinks a week.  Today, he denies symptoms of palpitations, chest pain, shortness of breath, orthopnea, PND, lower extremity edema, dizziness, presyncope, syncope, or neurologic sequela. The patient is tolerating medications without difficulties and is otherwise without complaint today.   Past Medical History:  Diagnosis Date  . Giant cell arteritis (HCC)    No past surgical history on file.  Current Outpatient Medications  Medication Sig Dispense Refill  . atorvastatin (LIPITOR) 10 MG tablet Take 10 mg by mouth daily.    . cholecalciferol (VITAMIN D) 1000 units tablet Take 1,000 Units by mouth daily.    Marland Kitchen escitalopram (LEXAPRO) 20 MG tablet Take 20 mg by mouth daily.    . ferrous gluconate (FERGON) 240 (27 FE) MG tablet Take 240 mg  by mouth every Monday, Wednesday, and Friday.    . folic acid (FOLVITE) 400 MCG tablet Take 400 mcg by mouth daily.     Marland Kitchen HYDROcodone-acetaminophen (NORCO/VICODIN) 5-325 MG tablet Take 1 tablet by mouth every 6 (six) hours as needed. 15 tablet 0  . methotrexate (RHEUMATREX) 2.5 MG tablet Take 20 mg by mouth every Saturday.     . Omeprazole 20 MG TBEC Take 20 mg by mouth daily.     . rivaroxaban (XARELTO) 20 MG TABS tablet Take 1 tablet (20 mg total) by mouth daily with supper. 30 tablet 0  . vitamin B-12 (CYANOCOBALAMIN) 1000 MCG tablet Take 1,000 mcg by mouth daily.     No current facility-administered medications for this encounter.     No Known Allergies  Social History   Socioeconomic History  . Marital status: Married    Spouse name: Not on file  . Number of children: Not on file  . Years of education: Not on file  . Highest education level: Not on file  Occupational History  . Not on file  Social Needs  . Financial resource strain: Not on file  . Food insecurity:    Worry: Not on file    Inability: Not on file  . Transportation needs:    Medical: Not on file    Non-medical: Not on file  Tobacco Use  . Smoking status: Former Games developer  . Smokeless tobacco: Never  Used  Substance and Sexual Activity  . Alcohol use: Yes    Alcohol/week: 3.0 standard drinks    Types: 3 Glasses of wine per week  . Drug use: No  . Sexual activity: Not on file  Lifestyle  . Physical activity:    Days per week: Not on file    Minutes per session: Not on file  . Stress: Not on file  Relationships  . Social connections:    Talks on phone: Not on file    Gets together: Not on file    Attends religious service: Not on file    Active member of club or organization: Not on file    Attends meetings of clubs or organizations: Not on file    Relationship status: Not on file  . Intimate partner violence:    Fear of current or ex partner: Not on file    Emotionally abused: Not on file     Physically abused: Not on file    Forced sexual activity: Not on file  Other Topics Concern  . Not on file  Social History Narrative  . Not on file    No family history on file.  ROS- All systems are reviewed and negative except as per the HPI above  Physical Exam: Vitals:   02/26/18 1015  BP: 120/74  Pulse: 74  Weight: 92.1 kg  Height: 6\' 2"  (1.88 m)   Wt Readings from Last 3 Encounters:  02/26/18 92.1 kg  01/24/18 91.6 kg  12/16/17 89 kg    Labs: Lab Results  Component Value Date   NA 141 12/16/2017   K 4.3 12/16/2017   CL 108 12/16/2017   CO2 27 12/16/2017   GLUCOSE 127 (H) 12/16/2017   BUN 19 12/16/2017   CREATININE 1.38 (H) 12/16/2017   CALCIUM 9.0 12/16/2017   No results found for: INR No results found for: CHOL, HDL, LDLCALC, TRIG   GEN- The patient is well appearing, alert and oriented x 3 today.   Head- normocephalic, atraumatic Eyes-  Sclera clear, conjunctiva pink Ears- hearing intact Oropharynx- clear Neck- supple, no JVP Lymph- no cervical lymphadenopathy Lungs- Clear to ausculation bilaterally, normal work of breathing Heart- irregular rate and rhythm, no murmurs, rubs or gallops, PMI not laterally displaced GI- soft, NT, ND, + BS Extremities- no clubbing, cyanosis, or edema MS- no significant deformity or atrophy Skin- no rash or lesion Psych- euthymic mood, full affect Neuro- strength and sensation are intact  EKG-afib at 74 bpm, RBBB Echo-Study Conclusions  - Left ventricle: The cavity size was normal. There was moderate   concentric hypertrophy. Systolic function was vigorous. The   estimated ejection fraction was in the range of 65% to 70%. Wall   motion was normal; there were no regional wall motion   abnormalities. The study was not technically sufficient to allow   evaluation of LV diastolic dysfunction due to atrial   fibrillation. - Aortic valve: There was moderate regurgitation. - Ascending aorta: The ascending aorta was  moderately dilated   measuring 47 mm. - Mitral valve: There was mild regurgitation. - Left atrium: The atrium was moderately dilated. - Right ventricle: The cavity size was normal. Wall thickness was   normal. Systolic function was normal. - Right atrium: The atrium was moderately dilated. - Tricuspid valve: There was mild regurgitation. - Pulmonary arteries: Systolic pressure was within the normal   range. - Pericardium, extracardiac: There was no pericardial effusion.  Impressions:  - Ascending aorta is moderately dilated  measuring 47 mm, previously   45 mm in 07/01/2013.  Monitor reviewed from Dr. Laverle Hobby office  Assessment and Plan: 1. New onset of Afib, but exact onset unknown Pt is completely asymptomatic and does not understand all the fuss about this I do not believe rate control is needed at this time as pt is rate controlled, not symptomatic and may worsen his occasional bradycardia I do not believe  cardioversion is indicated as he is asymptomatic and with left atrium size of 50 may be very difficult to restore SR long term  2. CHA2DS2VASc score is 2(age) He is appropriately dosed on 20 mg xarelto with a crcl cal at 74.86 Bleeding precautions discussed  Denies any bleeding issues since starting xarelto Cbc/bmet today  Will refer him on to establish with  general cardiology for afib as well as abnormalities on echo, including ascending aorta dilation  Lupita Leash C. [facility] [address] [place], [place] [address] [phone]

## 2018-03-05 ENCOUNTER — Encounter (HOSPITAL_COMMUNITY): Payer: Self-pay | Admitting: *Deleted

## 2018-03-21 ENCOUNTER — Ambulatory Visit (HOSPITAL_COMMUNITY)
Admission: RE | Admit: 2018-03-21 | Discharge: 2018-03-21 | Disposition: A | Discharge status: Home / Self Care | Payer: Medicare Other | Source: Ambulatory Visit | Attending: Nurse Practitioner | Admitting: Nurse Practitioner

## 2018-03-21 DIAGNOSIS — I4891 Unspecified atrial fibrillation: Secondary | ICD-10-CM | POA: Diagnosis present

## 2018-03-21 LAB — CBC
HCT: 38.9 % — ABNORMAL LOW (ref 39.0–52.0)
Hemoglobin: 11.9 g/dL — ABNORMAL LOW (ref 13.0–17.0)
MCH: 29.8 pg (ref 26.0–34.0)
MCHC: 30.6 g/dL (ref 30.0–36.0)
MCV: 97.5 fL (ref 80.0–100.0)
Platelets: 227 10*3/uL (ref 150–400)
RBC: 3.99 MIL/uL — ABNORMAL LOW (ref 4.22–5.81)
RDW: 14.6 % (ref 11.5–15.5)
WBC: 6.1 10*3/uL (ref 4.0–10.5)
nRBC: 0 % (ref 0.0–0.2)

## 2018-04-20 ENCOUNTER — Other Ambulatory Visit: Payer: Self-pay | Admitting: Geriatric Medicine

## 2018-04-20 DIAGNOSIS — R109 Unspecified abdominal pain: Secondary | ICD-10-CM

## 2018-04-27 ENCOUNTER — Ambulatory Visit
Admission: RE | Admit: 2018-04-27 | Discharge: 2018-04-27 | Disposition: A | Discharge status: Home / Self Care | Payer: Medicare Other | Source: Ambulatory Visit | Attending: Geriatric Medicine | Admitting: Geriatric Medicine

## 2018-04-27 DIAGNOSIS — R109 Unspecified abdominal pain: Secondary | ICD-10-CM

## 2018-04-27 MED ORDER — IOPAMIDOL (ISOVUE-300) INJECTION 61%
100.0000 mL | Freq: Once | INTRAVENOUS | Status: AC | PRN
  Administered 2018-04-27: 100 mL via INTRAVENOUS

## 2018-11-12 ENCOUNTER — Other Ambulatory Visit: Payer: Self-pay | Admitting: Geriatric Medicine

## 2018-11-12 DIAGNOSIS — D7389 Other diseases of spleen: Secondary | ICD-10-CM

## 2018-11-21 ENCOUNTER — Other Ambulatory Visit: Payer: Medicare Other

## 2018-11-26 ENCOUNTER — Ambulatory Visit
Admission: RE | Admit: 2018-11-26 | Discharge: 2018-11-26 | Disposition: A | Discharge status: Home / Self Care | Payer: Medicare Other | Source: Ambulatory Visit | Attending: Geriatric Medicine | Admitting: Geriatric Medicine

## 2018-11-26 DIAGNOSIS — D7389 Other diseases of spleen: Secondary | ICD-10-CM

## 2018-11-26 MED ORDER — IOPAMIDOL (ISOVUE-300) INJECTION 61%
100.0000 mL | Freq: Once | INTRAVENOUS | Status: AC | PRN
  Administered 2018-11-26: 100 mL via INTRAVENOUS

## 2018-11-29 ENCOUNTER — Other Ambulatory Visit: Payer: Self-pay | Admitting: Geriatric Medicine

## 2018-11-29 DIAGNOSIS — R911 Solitary pulmonary nodule: Secondary | ICD-10-CM

## 2018-12-11 ENCOUNTER — Ambulatory Visit
Admission: RE | Admit: 2018-12-11 | Discharge: 2018-12-11 | Disposition: A | Discharge status: Home / Self Care | Payer: Medicare Other | Source: Ambulatory Visit | Attending: Geriatric Medicine | Admitting: Geriatric Medicine

## 2018-12-11 DIAGNOSIS — R911 Solitary pulmonary nodule: Secondary | ICD-10-CM

## 2019-05-18 ENCOUNTER — Ambulatory Visit: Payer: Medicare Other | Attending: Internal Medicine

## 2019-05-18 DIAGNOSIS — Z23 Encounter for immunization: Secondary | ICD-10-CM | POA: Insufficient documentation

## 2019-05-18 NOTE — Progress Notes (Signed)
   Covid-19 Vaccination Clinic  Name:  Nathan Mitchell    MRN: [id number] DOB: 03-22-1931  05/18/2019  [name] was observed post Covid-19 immunization for 15 minutes without incidence. He was provided with Vaccine Information Sheet and instruction to access the V-Safe system.   [name] was instructed to call 911 with any severe reactions post vaccine: Marland Kitchen Difficulty breathing  . Swelling of your face and throat  . A fast heartbeat  . A bad rash all over your body  . Dizziness and weakness    Immunizations Administered    Name Date Dose VIS Date Route   Pfizer COVID-19 Vaccine 05/18/2019 11:46 AM 0.3 mL 04/12/2019 Intramuscular   Manufacturer: [employer], [employer]   Lot: [id number]   NDC: [address]-6

## 2019-06-08 ENCOUNTER — Ambulatory Visit: Payer: Medicare Other | Attending: Internal Medicine

## 2019-06-08 DIAGNOSIS — Z23 Encounter for immunization: Secondary | ICD-10-CM

## 2019-06-08 NOTE — Progress Notes (Signed)
   Covid-19 Vaccination Clinic  Name:  Nathan Mitchell    MRN: [id number] DOB: 1931-03-10  06/08/2019  [name] was observed post Covid-19 immunization for 15 minutes without incidence. He was provided with Vaccine Information Sheet and instruction to access the V-Safe system.   [name] was instructed to call 911 with any severe reactions post vaccine: Marland Kitchen Difficulty breathing  . Swelling of your face and throat  . A fast heartbeat  . A bad rash all over your body  . Dizziness and weakness    Immunizations Administered    Name Date Dose VIS Date Route   Pfizer COVID-19 Vaccine 06/08/2019 11:23 AM 0.3 mL 04/12/2019 Intramuscular   Manufacturer: [employer], [employer]   Lot: [id number]   NDC: [address]-4

## 2019-06-11 ENCOUNTER — Other Ambulatory Visit: Payer: Self-pay | Admitting: Geriatric Medicine

## 2019-06-11 DIAGNOSIS — R911 Solitary pulmonary nodule: Secondary | ICD-10-CM

## 2019-06-21 ENCOUNTER — Other Ambulatory Visit: Payer: Medicare Other

## 2019-06-25 ENCOUNTER — Ambulatory Visit
Admission: RE | Admit: 2019-06-25 | Discharge: 2019-06-25 | Disposition: A | Discharge status: Home / Self Care | Payer: Medicare Other | Source: Ambulatory Visit | Attending: Geriatric Medicine | Admitting: Geriatric Medicine

## 2019-06-25 DIAGNOSIS — R911 Solitary pulmonary nodule: Secondary | ICD-10-CM

## 2019-10-15 DIAGNOSIS — M2550 Pain in unspecified joint: Secondary | ICD-10-CM | POA: Diagnosis not present

## 2019-10-15 DIAGNOSIS — M316 Other giant cell arteritis: Secondary | ICD-10-CM | POA: Diagnosis not present

## 2019-10-15 DIAGNOSIS — Z6826 Body mass index (BMI) 26.0-26.9, adult: Secondary | ICD-10-CM | POA: Diagnosis not present

## 2019-10-15 DIAGNOSIS — E663 Overweight: Secondary | ICD-10-CM | POA: Diagnosis not present

## 2019-10-15 DIAGNOSIS — H542X22 Low vision right eye category 2, low vision left eye category 2: Secondary | ICD-10-CM | POA: Diagnosis not present

## 2019-10-15 DIAGNOSIS — M150 Primary generalized (osteo)arthritis: Secondary | ICD-10-CM | POA: Diagnosis not present

## 2019-12-17 ENCOUNTER — Other Ambulatory Visit: Payer: Self-pay | Admitting: Geriatric Medicine

## 2019-12-17 DIAGNOSIS — I712 Thoracic aortic aneurysm, without rupture, unspecified: Secondary | ICD-10-CM

## 2019-12-17 DIAGNOSIS — R911 Solitary pulmonary nodule: Secondary | ICD-10-CM

## 2019-12-25 ENCOUNTER — Ambulatory Visit
Admission: RE | Admit: 2019-12-25 | Discharge: 2019-12-25 | Disposition: A | Discharge status: Home / Self Care | Payer: Medicare PPO | Source: Ambulatory Visit | Attending: Geriatric Medicine | Admitting: Geriatric Medicine

## 2019-12-25 DIAGNOSIS — I712 Thoracic aortic aneurysm, without rupture, unspecified: Secondary | ICD-10-CM

## 2019-12-25 DIAGNOSIS — R911 Solitary pulmonary nodule: Secondary | ICD-10-CM

## 2019-12-26 DIAGNOSIS — Z85828 Personal history of other malignant neoplasm of skin: Secondary | ICD-10-CM | POA: Diagnosis not present

## 2019-12-26 DIAGNOSIS — D045 Carcinoma in situ of skin of trunk: Secondary | ICD-10-CM | POA: Diagnosis not present

## 2019-12-26 DIAGNOSIS — L570 Actinic keratosis: Secondary | ICD-10-CM | POA: Diagnosis not present

## 2019-12-26 DIAGNOSIS — L821 Other seborrheic keratosis: Secondary | ICD-10-CM | POA: Diagnosis not present

## 2019-12-26 DIAGNOSIS — D0472 Carcinoma in situ of skin of left lower limb, including hip: Secondary | ICD-10-CM | POA: Diagnosis not present

## 2019-12-26 DIAGNOSIS — C44722 Squamous cell carcinoma of skin of right lower limb, including hip: Secondary | ICD-10-CM | POA: Diagnosis not present

## 2019-12-26 DIAGNOSIS — D225 Melanocytic nevi of trunk: Secondary | ICD-10-CM | POA: Diagnosis not present

## 2019-12-26 DIAGNOSIS — L814 Other melanin hyperpigmentation: Secondary | ICD-10-CM | POA: Diagnosis not present

## 2019-12-26 DIAGNOSIS — D1801 Hemangioma of skin and subcutaneous tissue: Secondary | ICD-10-CM | POA: Diagnosis not present

## 2019-12-31 ENCOUNTER — Ambulatory Visit: Payer: Self-pay | Attending: Internal Medicine

## 2019-12-31 DIAGNOSIS — Z23 Encounter for immunization: Secondary | ICD-10-CM

## 2019-12-31 NOTE — Progress Notes (Signed)
   Covid-19 Vaccination Clinic  Name:  WALLY SHEVCHENKO    MRN: [id number] DOB: 10/13/1930  12/31/2019  [name] III was observed post Covid-19 immunization for 15 minutes without incident. He was provided with Vaccine Information Sheet and instruction to access the V-Safe system.   Mr. Carolyn Stare III was instructed to call 911 with any severe reactions post vaccine: Marland Kitchen Difficulty breathing  . Swelling of face and throat  . A fast heartbeat  . A bad rash all over body  . Dizziness and weakness

## 2020-01-15 DIAGNOSIS — M316 Other giant cell arteritis: Secondary | ICD-10-CM | POA: Diagnosis not present

## 2020-01-20 DIAGNOSIS — Z85828 Personal history of other malignant neoplasm of skin: Secondary | ICD-10-CM | POA: Diagnosis not present

## 2020-01-20 DIAGNOSIS — C44729 Squamous cell carcinoma of skin of left lower limb, including hip: Secondary | ICD-10-CM | POA: Diagnosis not present

## 2020-02-14 DIAGNOSIS — I482 Chronic atrial fibrillation, unspecified: Secondary | ICD-10-CM | POA: Diagnosis not present

## 2020-02-14 DIAGNOSIS — Z1389 Encounter for screening for other disorder: Secondary | ICD-10-CM | POA: Diagnosis not present

## 2020-02-14 DIAGNOSIS — Z23 Encounter for immunization: Secondary | ICD-10-CM | POA: Diagnosis not present

## 2020-02-14 DIAGNOSIS — J449 Chronic obstructive pulmonary disease, unspecified: Secondary | ICD-10-CM | POA: Diagnosis not present

## 2020-02-14 DIAGNOSIS — Z0000 Encounter for general adult medical examination without abnormal findings: Secondary | ICD-10-CM | POA: Diagnosis not present

## 2020-02-14 DIAGNOSIS — I351 Nonrheumatic aortic (valve) insufficiency: Secondary | ICD-10-CM | POA: Diagnosis not present

## 2020-02-14 DIAGNOSIS — H539 Unspecified visual disturbance: Secondary | ICD-10-CM | POA: Diagnosis not present

## 2020-02-14 DIAGNOSIS — I77810 Thoracic aortic ectasia: Secondary | ICD-10-CM | POA: Diagnosis not present

## 2020-02-14 DIAGNOSIS — M316 Other giant cell arteritis: Secondary | ICD-10-CM | POA: Diagnosis not present

## 2020-02-14 DIAGNOSIS — E7800 Pure hypercholesterolemia, unspecified: Secondary | ICD-10-CM | POA: Diagnosis not present

## 2020-02-14 DIAGNOSIS — Z79899 Other long term (current) drug therapy: Secondary | ICD-10-CM | POA: Diagnosis not present

## 2020-02-18 DIAGNOSIS — L0889 Other specified local infections of the skin and subcutaneous tissue: Secondary | ICD-10-CM | POA: Diagnosis not present

## 2020-02-19 DIAGNOSIS — I341 Nonrheumatic mitral (valve) prolapse: Secondary | ICD-10-CM | POA: Diagnosis not present

## 2020-02-19 DIAGNOSIS — I351 Nonrheumatic aortic (valve) insufficiency: Secondary | ICD-10-CM | POA: Diagnosis not present

## 2020-03-31 DIAGNOSIS — L570 Actinic keratosis: Secondary | ICD-10-CM | POA: Diagnosis not present

## 2020-03-31 DIAGNOSIS — D1801 Hemangioma of skin and subcutaneous tissue: Secondary | ICD-10-CM | POA: Diagnosis not present

## 2020-03-31 DIAGNOSIS — L820 Inflamed seborrheic keratosis: Secondary | ICD-10-CM | POA: Diagnosis not present

## 2020-03-31 DIAGNOSIS — Z85828 Personal history of other malignant neoplasm of skin: Secondary | ICD-10-CM | POA: Diagnosis not present

## 2020-03-31 DIAGNOSIS — D0461 Carcinoma in situ of skin of right upper limb, including shoulder: Secondary | ICD-10-CM | POA: Diagnosis not present

## 2020-03-31 DIAGNOSIS — L821 Other seborrheic keratosis: Secondary | ICD-10-CM | POA: Diagnosis not present

## 2020-03-31 DIAGNOSIS — L814 Other melanin hyperpigmentation: Secondary | ICD-10-CM | POA: Diagnosis not present

## 2020-03-31 DIAGNOSIS — C44729 Squamous cell carcinoma of skin of left lower limb, including hip: Secondary | ICD-10-CM | POA: Diagnosis not present

## 2020-03-31 DIAGNOSIS — H61021 Chronic perichondritis of right external ear: Secondary | ICD-10-CM | POA: Diagnosis not present

## 2020-03-31 DIAGNOSIS — C44319 Basal cell carcinoma of skin of other parts of face: Secondary | ICD-10-CM | POA: Diagnosis not present

## 2020-04-14 DIAGNOSIS — M2550 Pain in unspecified joint: Secondary | ICD-10-CM | POA: Diagnosis not present

## 2020-04-14 DIAGNOSIS — M316 Other giant cell arteritis: Secondary | ICD-10-CM | POA: Diagnosis not present

## 2020-04-14 DIAGNOSIS — Z79899 Other long term (current) drug therapy: Secondary | ICD-10-CM | POA: Diagnosis not present

## 2020-04-14 DIAGNOSIS — H542X22 Low vision right eye category 2, low vision left eye category 2: Secondary | ICD-10-CM | POA: Diagnosis not present

## 2020-04-14 DIAGNOSIS — E663 Overweight: Secondary | ICD-10-CM | POA: Diagnosis not present

## 2020-04-14 DIAGNOSIS — Z6827 Body mass index (BMI) 27.0-27.9, adult: Secondary | ICD-10-CM | POA: Diagnosis not present

## 2020-04-14 DIAGNOSIS — M150 Primary generalized (osteo)arthritis: Secondary | ICD-10-CM | POA: Diagnosis not present

## 2020-04-14 DIAGNOSIS — R5383 Other fatigue: Secondary | ICD-10-CM | POA: Diagnosis not present

## 2020-04-15 DIAGNOSIS — Z85828 Personal history of other malignant neoplasm of skin: Secondary | ICD-10-CM | POA: Diagnosis not present

## 2020-04-15 DIAGNOSIS — C44319 Basal cell carcinoma of skin of other parts of face: Secondary | ICD-10-CM | POA: Diagnosis not present

## 2020-04-15 DIAGNOSIS — L0889 Other specified local infections of the skin and subcutaneous tissue: Secondary | ICD-10-CM | POA: Diagnosis not present

## 2020-05-14 DIAGNOSIS — J029 Acute pharyngitis, unspecified: Secondary | ICD-10-CM | POA: Diagnosis not present

## 2020-06-29 DIAGNOSIS — D1801 Hemangioma of skin and subcutaneous tissue: Secondary | ICD-10-CM | POA: Diagnosis not present

## 2020-06-29 DIAGNOSIS — L821 Other seborrheic keratosis: Secondary | ICD-10-CM | POA: Diagnosis not present

## 2020-06-29 DIAGNOSIS — L820 Inflamed seborrheic keratosis: Secondary | ICD-10-CM | POA: Diagnosis not present

## 2020-06-29 DIAGNOSIS — Z85828 Personal history of other malignant neoplasm of skin: Secondary | ICD-10-CM | POA: Diagnosis not present

## 2020-06-29 DIAGNOSIS — L570 Actinic keratosis: Secondary | ICD-10-CM | POA: Diagnosis not present

## 2020-06-29 DIAGNOSIS — C44529 Squamous cell carcinoma of skin of other part of trunk: Secondary | ICD-10-CM | POA: Diagnosis not present

## 2020-06-29 DIAGNOSIS — L814 Other melanin hyperpigmentation: Secondary | ICD-10-CM | POA: Diagnosis not present

## 2020-06-29 DIAGNOSIS — D045 Carcinoma in situ of skin of trunk: Secondary | ICD-10-CM | POA: Diagnosis not present

## 2020-07-09 DIAGNOSIS — H47013 Ischemic optic neuropathy, bilateral: Secondary | ICD-10-CM | POA: Diagnosis not present

## 2020-07-09 DIAGNOSIS — H04121 Dry eye syndrome of right lacrimal gland: Secondary | ICD-10-CM | POA: Diagnosis not present

## 2020-07-20 DIAGNOSIS — M316 Other giant cell arteritis: Secondary | ICD-10-CM | POA: Diagnosis not present

## 2020-07-20 DIAGNOSIS — M150 Primary generalized (osteo)arthritis: Secondary | ICD-10-CM | POA: Diagnosis not present

## 2020-07-20 DIAGNOSIS — Z79899 Other long term (current) drug therapy: Secondary | ICD-10-CM | POA: Diagnosis not present

## 2020-07-20 DIAGNOSIS — Z6826 Body mass index (BMI) 26.0-26.9, adult: Secondary | ICD-10-CM | POA: Diagnosis not present

## 2020-07-20 DIAGNOSIS — E663 Overweight: Secondary | ICD-10-CM | POA: Diagnosis not present

## 2020-07-20 DIAGNOSIS — H542X22 Low vision right eye category 2, low vision left eye category 2: Secondary | ICD-10-CM | POA: Diagnosis not present

## 2020-07-20 DIAGNOSIS — M2550 Pain in unspecified joint: Secondary | ICD-10-CM | POA: Diagnosis not present

## 2020-08-11 DIAGNOSIS — R350 Frequency of micturition: Secondary | ICD-10-CM | POA: Diagnosis not present

## 2020-08-11 DIAGNOSIS — R39198 Other difficulties with micturition: Secondary | ICD-10-CM | POA: Diagnosis not present

## 2020-08-31 DIAGNOSIS — M8589 Other specified disorders of bone density and structure, multiple sites: Secondary | ICD-10-CM | POA: Diagnosis not present

## 2020-08-31 DIAGNOSIS — I712 Thoracic aortic aneurysm, without rupture: Secondary | ICD-10-CM | POA: Diagnosis not present

## 2020-08-31 DIAGNOSIS — D6869 Other thrombophilia: Secondary | ICD-10-CM | POA: Diagnosis not present

## 2020-08-31 DIAGNOSIS — Z23 Encounter for immunization: Secondary | ICD-10-CM | POA: Diagnosis not present

## 2020-08-31 DIAGNOSIS — E7800 Pure hypercholesterolemia, unspecified: Secondary | ICD-10-CM | POA: Diagnosis not present

## 2020-08-31 DIAGNOSIS — Z1389 Encounter for screening for other disorder: Secondary | ICD-10-CM | POA: Diagnosis not present

## 2020-08-31 DIAGNOSIS — I700 Atherosclerosis of aorta: Secondary | ICD-10-CM | POA: Diagnosis not present

## 2020-08-31 DIAGNOSIS — I482 Chronic atrial fibrillation, unspecified: Secondary | ICD-10-CM | POA: Diagnosis not present

## 2020-08-31 DIAGNOSIS — M316 Other giant cell arteritis: Secondary | ICD-10-CM | POA: Diagnosis not present

## 2020-08-31 DIAGNOSIS — Z0000 Encounter for general adult medical examination without abnormal findings: Secondary | ICD-10-CM | POA: Diagnosis not present

## 2020-09-05 ENCOUNTER — Emergency Department (HOSPITAL_BASED_OUTPATIENT_CLINIC_OR_DEPARTMENT_OTHER): Payer: Medicare PPO

## 2020-09-05 ENCOUNTER — Emergency Department (HOSPITAL_BASED_OUTPATIENT_CLINIC_OR_DEPARTMENT_OTHER)
Admission: EM | Admit: 2020-09-05 | Discharge: 2020-09-05 | Disposition: A | Discharge status: Home / Self Care | Payer: Medicare PPO | Attending: Emergency Medicine | Admitting: Emergency Medicine

## 2020-09-05 ENCOUNTER — Encounter (HOSPITAL_BASED_OUTPATIENT_CLINIC_OR_DEPARTMENT_OTHER): Payer: Self-pay

## 2020-09-05 ENCOUNTER — Other Ambulatory Visit: Payer: Self-pay

## 2020-09-05 DIAGNOSIS — Z7901 Long term (current) use of anticoagulants: Secondary | ICD-10-CM | POA: Diagnosis not present

## 2020-09-05 DIAGNOSIS — N401 Enlarged prostate with lower urinary tract symptoms: Secondary | ICD-10-CM

## 2020-09-05 DIAGNOSIS — R339 Retention of urine, unspecified: Secondary | ICD-10-CM | POA: Diagnosis not present

## 2020-09-05 DIAGNOSIS — R109 Unspecified abdominal pain: Secondary | ICD-10-CM | POA: Diagnosis not present

## 2020-09-05 DIAGNOSIS — N179 Acute kidney failure, unspecified: Secondary | ICD-10-CM | POA: Diagnosis not present

## 2020-09-05 DIAGNOSIS — I728 Aneurysm of other specified arteries: Secondary | ICD-10-CM | POA: Diagnosis not present

## 2020-09-05 DIAGNOSIS — Z87891 Personal history of nicotine dependence: Secondary | ICD-10-CM | POA: Diagnosis not present

## 2020-09-05 DIAGNOSIS — N3289 Other specified disorders of bladder: Secondary | ICD-10-CM | POA: Diagnosis not present

## 2020-09-05 DIAGNOSIS — R338 Other retention of urine: Secondary | ICD-10-CM

## 2020-09-05 LAB — BASIC METABOLIC PANEL
Anion gap: 10 (ref 5–15)
BUN: 17 mg/dL (ref 8–23)
CO2: 24 mmol/L (ref 22–32)
Calcium: 8.6 mg/dL — ABNORMAL LOW (ref 8.9–10.3)
Chloride: 106 mmol/L (ref 98–111)
Creatinine, Ser: 1.98 mg/dL — ABNORMAL HIGH (ref 0.61–1.24)
GFR, Estimated: 32 mL/min — ABNORMAL LOW (ref >60)
Glucose, Bld: 120 mg/dL — ABNORMAL HIGH (ref 70–99)
Potassium: 4.3 mmol/L (ref 3.5–5.1)
Sodium: 140 mmol/L (ref 135–145)

## 2020-09-05 LAB — CBC WITH DIFFERENTIAL/PLATELET
Abs Immature Granulocytes: 0.01 10*3/uL (ref 0.00–0.07)
Basophils Absolute: 0 10*3/uL (ref 0.0–0.1)
Basophils Relative: 0 %
Eosinophils Absolute: 0 10*3/uL (ref 0.0–0.5)
Eosinophils Relative: 0 %
HCT: 35.5 % — ABNORMAL LOW (ref 39.0–52.0)
Hemoglobin: 11.2 g/dL — ABNORMAL LOW (ref 13.0–17.0)
Immature Granulocytes: 0 %
Lymphocytes Relative: 3 %
Lymphs Abs: 0.3 10*3/uL — ABNORMAL LOW (ref 0.7–4.0)
MCH: 29.9 pg (ref 26.0–34.0)
MCHC: 31.5 g/dL (ref 30.0–36.0)
MCV: 94.9 fL (ref 80.0–100.0)
Monocytes Absolute: 0.9 10*3/uL (ref 0.1–1.0)
Monocytes Relative: 10 %
Neutro Abs: 8.5 10*3/uL — ABNORMAL HIGH (ref 1.7–7.7)
Neutrophils Relative %: 87 %
Platelets: 190 10*3/uL (ref 150–400)
RBC: 3.74 MIL/uL — ABNORMAL LOW (ref 4.22–5.81)
RDW: 16.3 % — ABNORMAL HIGH (ref 11.5–15.5)
WBC: 9.8 10*3/uL (ref 4.0–10.5)
nRBC: 0 % (ref 0.0–0.2)

## 2020-09-05 LAB — URINALYSIS, ROUTINE W REFLEX MICROSCOPIC
Bilirubin Urine: NEGATIVE
Glucose, UA: NEGATIVE mg/dL
Ketones, ur: NEGATIVE mg/dL
Leukocytes,Ua: NEGATIVE
Nitrite: NEGATIVE
RBC / HPF: >50 RBC/hpf — ABNORMAL HIGH (ref 0–5)
Specific Gravity, Urine: 1.009 (ref 1.005–1.030)
pH: 5.5 (ref 5.0–8.0)

## 2020-09-05 NOTE — ED Triage Notes (Signed)
Urinary retention since yesterday.  Recurring problem starting a few days ago.

## 2020-09-05 NOTE — ED Provider Notes (Signed)
[facility] EMERGENCY DEPT Provider Note   CSN: [id number] Arrival date & time: 09/05/20  1045     History Chief Complaint  Patient presents with  . Urinary Retention    Nathan Mitchell is a 87 y.o. male.  Reports he has not urinated in almost 24 hours.  He was having some urinary frequency for which he saw his doctor and was prescribed Flomax.  The history is provided by the patient and the spouse.  Male GU Problem Presenting symptoms: no dysuria   Presenting symptoms comment:  Urinary retention Context: spontaneously   Relieved by:  Nothing Worsened by:  Nothing Ineffective treatments:  None tried Associated symptoms: abdominal pain   Associated symptoms: no fever, no flank pain, no hematuria, no nausea and no vomiting   Risk factors: no recent infection and no urinary catheter        Past Medical History:  Diagnosis Date  . Giant cell arteritis (HCC)     There are no problems to display for this patient.   History reviewed. No pertinent surgical history.     History reviewed. No pertinent family history.  Social History   Tobacco Use  . Smoking status: Former Games developer  . Smokeless tobacco: Never Used  Vaping Use  . Vaping Use: Never used  Substance Use Topics  . Alcohol use: Yes    Alcohol/week: 3.0 standard drinks    Types: 3 Glasses of wine per week  . Drug use: No    Home Medications Prior to Admission medications   Medication Sig Start Date End Date Taking? Authorizing Provider  atorvastatin (LIPITOR) 10 MG tablet Take 10 mg by mouth daily.    [provider]  cholecalciferol (VITAMIN D) 1000 units tablet Take 1,000 Units by mouth daily.    [provider]  escitalopram (LEXAPRO) 20 MG tablet Take 20 mg by mouth daily.    [provider]  ferrous gluconate (FERGON) 240 (27 FE) MG tablet Take 240 mg by mouth every Monday, Wednesday, and Friday.    [provider]  folic acid (FOLVITE) 400 MCG  tablet Take 400 mcg by mouth daily.  12/02/14   [provider]  HYDROcodone-acetaminophen (NORCO/VICODIN) 5-325 MG tablet Take 1 tablet by mouth every 6 (six) hours as needed. 06/07/15   Gwyneth Sprout, MD  methotrexate (RHEUMATREX) 2.5 MG tablet Take 20 mg by mouth every Saturday.  04/24/15   [provider]  Omeprazole 20 MG TBEC Take 20 mg by mouth daily.     [provider]  rivaroxaban (XARELTO) 20 MG TABS tablet Take 1 tablet (20 mg total) by mouth daily with supper. 12/16/17   Law, Waylan Boga, PA-C  vitamin B-12 (CYANOCOBALAMIN) 1000 MCG tablet Take 1,000 mcg by mouth daily.    [provider]    Allergies    Patient has no known allergies.  Review of Systems   Review of Systems  Constitutional: Negative for chills and fever.  HENT: Negative for ear pain and sore throat.   Eyes: Negative for pain and visual disturbance.  Respiratory: Negative for cough and shortness of breath.   Cardiovascular: Negative for chest pain and palpitations.  Gastrointestinal: Positive for abdominal pain. Negative for nausea and vomiting.  Genitourinary: Negative for dysuria, flank pain and hematuria.  Musculoskeletal: Negative for arthralgias and back pain.  Skin: Negative for color change and rash.  Neurological: Negative for seizures and syncope.  All other systems reviewed and are negative.   Physical Exam  Updated Vital Signs BP (!) 189/104 (BP Location: Right Arm)   Pulse 82   Temp 98.1 F (36.7 C) (Oral)   Resp 18   Ht 6\' 2"  (1.88 m)   Wt 92 kg   SpO2 98%   BMI 26.04 kg/m   Physical Exam Vitals and nursing note reviewed.  Constitutional:      Appearance: Normal appearance.  HENT:     Head: Normocephalic and atraumatic.  Eyes:     Conjunctiva/sclera: Conjunctivae normal.  Pulmonary:     Effort: Pulmonary effort is normal. No respiratory distress.  Abdominal:     General: There is no distension.     Palpations: Abdomen is soft.      Tenderness: There is no abdominal tenderness. There is no right CVA tenderness or left CVA tenderness.  Musculoskeletal:        General: No deformity. Normal range of motion.     Cervical back: Normal range of motion.  Skin:    General: Skin is warm and dry.  Neurological:     General: No focal deficit present.     Mental Status: He is alert and oriented to person, place, and time. Mental status is at baseline.  Psychiatric:        Mood and Affect: Mood normal.     ED Results / Procedures / Treatments   Labs (all labs ordered are listed, but only abnormal results are displayed) Labs Reviewed  BASIC METABOLIC PANEL - Abnormal; Notable for the following components:      Result Value   Glucose, Bld 120 (*)    Creatinine, Ser 1.98 (*)    Calcium 8.6 (*)    GFR, Estimated 32 (*)    All other components within normal limits  CBC WITH DIFFERENTIAL/PLATELET - Abnormal; Notable for the following components:   RBC 3.74 (*)    Hemoglobin 11.2 (*)    HCT 35.5 (*)    RDW 16.3 (*)    Neutro Abs 8.5 (*)    Lymphs Abs 0.3 (*)    All other components within normal limits  URINALYSIS, ROUTINE W REFLEX MICROSCOPIC - Abnormal; Notable for the following components:   Color, Urine ORANGE (*)    APPearance HAZY (*)    Hgb urine dipstick LARGE (*)    Protein, ur TRACE (*)    RBC / HPF >50 (*)    Bacteria, UA RARE (*)    Crystals PRESENT (*)    All other components within normal limits  URINE CULTURE    EKG None  Radiology CT Renal Stone Study  Result Date: 09/05/2020 CLINICAL DATA:  Difficulty urinating since yesterday. EXAM: CT ABDOMEN AND PELVIS WITHOUT CONTRAST TECHNIQUE: Multidetector CT imaging of the abdomen and pelvis was performed following the standard protocol without IV contrast. COMPARISON:  Abdomen CT dated 11/26/2018 and abdomen and pelvis CT dated 04/27/2018. FINDINGS: Lower chest: Stable enlarged heart. Clear lung bases. The previously demonstrated patchy nodular appearance  to the in the left lower lobe is not currently included. Stable large hiatal hernia. Hepatobiliary: No focal liver abnormality is seen. No gallstones, gallbladder wall thickening, or biliary dilatation. Pancreas: Unremarkable. No pancreatic ductal dilatation or surrounding inflammatory changes. Spleen: Normal in size without focal abnormality. Adrenals/Urinary Tract: Stable low density right adrenal nodule. Normal appearing left adrenal gland. Unremarkable kidneys and ureters. Foley catheter in the urinary bladder with a small amount of air and urine in the bladder. Moderate diffuse bladder wall thickening, similar to that seen on 04/27/2018. No  urinary tract calculi seen. Stomach/Bowel: Large hiatal hernia. Multiple sigmoid and descending colon diverticula. No evidence of diverticulitis or appendicitis. Unremarkable small bowel. Vascular/Lymphatic: Atheromatous arterial calcifications. 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 No enlarged lymph nodes. Reproductive: Moderately enlarged prostate gland protruding into the base of the urinary bladder. Other: Small umbilical and left inguinal hernias containing fat. There is also a small portion of a small bowel loop extending into the umbilical hernia without signs of obstruction. Musculoskeletal: Lumbar and lower thoracic spine degenerative changes. IMPRESSION: 1. No urinary tract calculi or obstruction. 2. Moderate prostatic hypertrophy. 3. Moderate chronic bladder wall thickening, compatible with chronic bladder outlet obstruction by the enlarged prostate gland. 4. Stable large hiatal hernia. 5. Sigmoid and descending colon diverticulosis. 6. Stable right adrenal adenoma. 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 Electronically Signed   By: Beckie Salts M.D.   On: 09/05/2020 11:57    Procedures Procedures   Medications Ordered in ED Medications - No data to display  ED Course  I have reviewed the triage vital signs and the nursing  notes.  Pertinent labs & imaging results that were available during my care of the patient were reviewed by me and considered in my medical decision making (see chart for details).    MDM Rules/Calculators/A&P                          Reather Converse Mitchell presented with urinary retention.  Foley catheter was placed with good emptying of his bladder.  Urinalysis is somewhat equivocal, and urine culture is pending.  Antibiotics will be held until culture results are back.  It looks like his obstructive uropathy has created some mild acute kidney injury.  This should resolve with full emptying of his bladder.  He will be discharged home with his Foley catheter and recommendations to follow-up immediately with urology.  CT scan did not reveal other reasons for obstruction aside from prostatic hypertrophy. Final Clinical Impression(s) / ED Diagnoses Final diagnoses:  Urinary retention due to benign prostatic hyperplasia  Acute kidney injury [facility])    Rx / DC Orders ED Discharge Orders    None       Koleen Distance, MD 09/05/20 1239

## 2020-09-06 LAB — URINE CULTURE: Culture: NO GROWTH

## 2020-09-08 ENCOUNTER — Other Ambulatory Visit: Payer: Self-pay

## 2020-09-08 ENCOUNTER — Emergency Department (HOSPITAL_BASED_OUTPATIENT_CLINIC_OR_DEPARTMENT_OTHER)
Admission: EM | Admit: 2020-09-08 | Discharge: 2020-09-08 | Disposition: A | Discharge status: Home / Self Care | Payer: Medicare PPO | Attending: Emergency Medicine | Admitting: Emergency Medicine

## 2020-09-08 ENCOUNTER — Encounter (HOSPITAL_BASED_OUTPATIENT_CLINIC_OR_DEPARTMENT_OTHER): Payer: Self-pay | Admitting: Emergency Medicine

## 2020-09-08 DIAGNOSIS — T839XXA Unspecified complication of genitourinary prosthetic device, implant and graft, initial encounter: Secondary | ICD-10-CM

## 2020-09-08 DIAGNOSIS — Z87891 Personal history of nicotine dependence: Secondary | ICD-10-CM | POA: Insufficient documentation

## 2020-09-08 DIAGNOSIS — Z7901 Long term (current) use of anticoagulants: Secondary | ICD-10-CM | POA: Diagnosis not present

## 2020-09-08 DIAGNOSIS — R339 Retention of urine, unspecified: Secondary | ICD-10-CM | POA: Diagnosis present

## 2020-09-08 DIAGNOSIS — T83098A Other mechanical complication of other indwelling urethral catheter, initial encounter: Secondary | ICD-10-CM | POA: Diagnosis not present

## 2020-09-08 NOTE — ED Notes (Signed)
Patient verbalizes understanding of discharge instructions. Opportunity for questioning and answers were provided. Armband removed by staff, pt discharged from ED.  

## 2020-09-08 NOTE — ED Provider Notes (Signed)
[facility]ENTER [facility] EMERGENCY DEPT Provider Note   CSN: [id number] Arrival date & time: 09/08/20  8416     History Chief Complaint  Patient presents with  . foley cath leak    Nathan Mitchell is a 87 y.o. male.  He was seen 3 days ago for urinary retention and had a Foley catheter placed.  He is brought in by his wife for evaluation of leaking from the Foley bag that began last evening.  He denies any abdominal pain.  No fevers or chills.  No vomiting or diarrhea.  The history is provided by the patient.  Male GU Problem Presenting symptoms comment:  Leaking catheter Context: spontaneously   Relieved by:  Nothing Worsened by:  Nothing Ineffective treatments:  None tried Associated symptoms: urinary retention   Associated symptoms: no abdominal pain, no fever, no hematuria, no nausea and no vomiting   Risk factors: urinary catheter        Past Medical History:  Diagnosis Date  . Giant cell arteritis (HCC)     There are no problems to display for this patient.   History reviewed. No pertinent surgical history.     No family history on file.  Social History   Tobacco Use  . Smoking status: Former Games developer  . Smokeless tobacco: Never Used  Vaping Use  . Vaping Use: Never used  Substance Use Topics  . Alcohol use: Yes    Alcohol/week: 3.0 standard drinks    Types: 3 Glasses of wine per week  . Drug use: No    Home Medications Prior to Admission medications   Medication Sig Start Date End Date Taking? Authorizing Provider  atorvastatin (LIPITOR) 10 MG tablet Take 10 mg by mouth daily.    [provider]  cholecalciferol (VITAMIN D) 1000 units tablet Take 1,000 Units by mouth daily.    [provider]  escitalopram (LEXAPRO) 20 MG tablet Take 20 mg by mouth daily.    [provider]  ferrous gluconate (FERGON) 240 (27 FE) MG tablet Take 240 mg by mouth every Monday, Wednesday, and Friday.    [provider]  folic  acid (FOLVITE) 400 MCG tablet Take 400 mcg by mouth daily.  12/02/14   [provider]  HYDROcodone-acetaminophen (NORCO/VICODIN) 5-325 MG tablet Take 1 tablet by mouth every 6 (six) hours as needed. 06/07/15   Gwyneth Sprout, MD  methotrexate (RHEUMATREX) 2.5 MG tablet Take 20 mg by mouth every Saturday.  04/24/15   [provider]  Omeprazole 20 MG TBEC Take 20 mg by mouth daily.     [provider]  rivaroxaban (XARELTO) 20 MG TABS tablet Take 1 tablet (20 mg total) by mouth daily with supper. 12/16/17   Law, Waylan Boga, PA-C  vitamin B-12 (CYANOCOBALAMIN) 1000 MCG tablet Take 1,000 mcg by mouth daily.    [provider]    Allergies    Patient has no known allergies.  Review of Systems   Review of Systems  Constitutional: Negative for fever.  Gastrointestinal: Negative for abdominal pain, nausea and vomiting.  Genitourinary: Negative for hematuria.    Physical Exam Updated Vital Signs BP (!) 141/90 (BP Location: Right Arm)   Pulse 87   Temp 98.1 F (36.7 C) (Oral)   Resp 20   SpO2 99%   Physical Exam Vitals and nursing note reviewed.  Constitutional:      Appearance: Normal appearance. He is well-developed.  HENT:     Head: Normocephalic and atraumatic.  Eyes:     Conjunctiva/sclera: Conjunctivae normal.  Pulmonary:     Effort: Pulmonary effort is normal.  Genitourinary:    Comments: Uncircumcised male with urinary catheter in place. Musculoskeletal:     Cervical back: Neck supple.  Skin:    General: Skin is warm and dry.  Neurological:     General: No focal deficit present.     Mental Status: He is alert.     GCS: GCS eye subscore is 4. GCS verbal subscore is 5. GCS motor subscore is 6.     ED Results / Procedures / Treatments   Labs (all labs ordered are listed, but only abnormal results are displayed) Labs Reviewed - No data to display  EKG None  Radiology No results found.  Procedures Procedures   Medications  Ordered in ED Medications - No data to display  ED Course  I have reviewed the triage vital signs and the nursing notes.  Pertinent labs & imaging results that were available during my care of the patient were reviewed by me and considered in my medical decision making (see chart for details).  Clinical Course as of 09/08/20 1720  Tue Sep 08, 2020  1032 Nursing was able to change out his Foley leg bag with improvement in urine flow. [MB]    Clinical Course User Index [MB] Terrilee Files, MD   MDM Rules/Calculators/A&P                           Final Clinical Impression(s) / ED Diagnoses Final diagnoses:  Foley catheter problem, initial encounter [facility])    Rx / DC Orders ED Discharge Orders    None       Terrilee Files, MD 09/08/20 1720

## 2020-09-08 NOTE — ED Triage Notes (Signed)
Foley catheter leak x 1 day. cathetre placed on 09/05/2020. denies pain.

## 2020-09-11 ENCOUNTER — Encounter (HOSPITAL_BASED_OUTPATIENT_CLINIC_OR_DEPARTMENT_OTHER): Payer: Self-pay | Admitting: Emergency Medicine

## 2020-09-11 ENCOUNTER — Other Ambulatory Visit: Payer: Self-pay

## 2020-09-11 ENCOUNTER — Emergency Department (HOSPITAL_BASED_OUTPATIENT_CLINIC_OR_DEPARTMENT_OTHER)
Admission: EM | Admit: 2020-09-11 | Discharge: 2020-09-11 | Disposition: A | Discharge status: Home / Self Care | Payer: Medicare PPO | Attending: Emergency Medicine | Admitting: Emergency Medicine

## 2020-09-11 DIAGNOSIS — R338 Other retention of urine: Secondary | ICD-10-CM | POA: Insufficient documentation

## 2020-09-11 DIAGNOSIS — T83038A Leakage of other indwelling urethral catheter, initial encounter: Secondary | ICD-10-CM | POA: Diagnosis not present

## 2020-09-11 DIAGNOSIS — Z87891 Personal history of nicotine dependence: Secondary | ICD-10-CM | POA: Diagnosis not present

## 2020-09-11 DIAGNOSIS — Y846 Urinary catheterization as the cause of abnormal reaction of the patient, or of later complication, without mention of misadventure at the time of the procedure: Secondary | ICD-10-CM | POA: Insufficient documentation

## 2020-09-11 DIAGNOSIS — T839XXA Unspecified complication of genitourinary prosthetic device, implant and graft, initial encounter: Secondary | ICD-10-CM

## 2020-09-11 DIAGNOSIS — T83098A Other mechanical complication of other indwelling urethral catheter, initial encounter: Secondary | ICD-10-CM | POA: Diagnosis not present

## 2020-09-11 NOTE — ED Triage Notes (Signed)
Pt had foley placed last Saturday for urinary retention and was to follow up with urology. Urology cant see patient for another week. Pt states his leg bag is leaking. Pt denies any urine leakage around penis/foley,denies any pain or other symptoms.

## 2020-09-11 NOTE — ED Notes (Signed)
Extensive teaching regarding foley care, foley bag care/chainging foley bags. Teach back from patient and pts wife.

## 2020-09-11 NOTE — ED Provider Notes (Signed)
[facility] EMERGENCY DEPT Provider Note   CSN: [id number] Arrival date & time: 09/11/20  1536     History Chief Complaint  Patient presents with  . urethral catheter leaking    Urethral catheter cleaning    Nathan Mitchell is a 87 y.o. male.  Patient is a 87 year old male who complains of leaking from his Foley catheter bag.  He had a Foley catheter placed due to urinary retention 6 days ago.  He came back 3 days ago because his leg bag was leaking.  This was replaced.  He says that he noticed last night and today gets leaking again.  There is some concern that the bag is getting to full and leaking due to that.  He says it mostly leaks at night.  He does not have any pain to his penis.  No abdominal pain.  No fevers.  No vomiting.  [facility] appointment [facility] days.        Past Medical History:  Diagnosis Date  . Giant cell arteritis (HCC)     There are no problems to display for this patient.   History reviewed. No pertinent surgical history.     No family history on file.  Social History   Tobacco Use  . Smoking status: Former Games developer  . Smokeless tobacco: Never Used  Vaping Use  . Vaping Use: Never used  Substance Use Topics  . Alcohol use: Yes    Alcohol/week: 3.0 standard drinks    Types: 3 Glasses of wine per week  . Drug use: No    Home Medications Prior to Admission medications   Medication Sig Start Date End Date Taking? Authorizing Provider  atorvastatin (LIPITOR) 10 MG tablet Take 10 mg by mouth daily.   Yes [provider]  cholecalciferol (VITAMIN D) 1000 units tablet Take 1,000 Units by mouth daily.   Yes [provider]  escitalopram (LEXAPRO) 20 MG tablet Take 20 mg by mouth daily.   Yes [provider]  ferrous gluconate (FERGON) 240 (27 FE) MG tablet Take 240 mg by mouth every Monday, Wednesday, and Friday.   Yes [provider]  folic acid (FOLVITE) 400 MCG tablet  Take 400 mcg by mouth daily.  12/02/14  Yes [provider]  HYDROcodone-acetaminophen (NORCO/VICODIN) 5-325 MG tablet Take 1 tablet by mouth every 6 (six) hours as needed. 06/07/15  Yes Gwyneth Sprout, MD  methotrexate (RHEUMATREX) 2.5 MG tablet Take 20 mg by mouth every Saturday.  04/24/15  Yes [provider]  Omeprazole 20 MG TBEC Take 20 mg by mouth daily.    Yes [provider]  rivaroxaban (XARELTO) 20 MG TABS tablet Take 1 tablet (20 mg total) by mouth daily with supper. 12/16/17  Yes Law, Waylan Boga, PA-C  tamsulosin (FLOMAX) 0.4 MG CAPS capsule Take 0.8 mg by mouth daily after supper.   Yes [provider]  vitamin B-12 (CYANOCOBALAMIN) 1000 MCG tablet Take 1,000 mcg by mouth daily.   Yes [provider]    Allergies    Patient has no known allergies.  Review of Systems   Review of Systems  Constitutional: Negative for chills, diaphoresis, fatigue and fever.  HENT: Negative for congestion, rhinorrhea and sneezing.   Eyes: Negative.   Respiratory: Negative for cough, chest tightness and shortness of breath.   Cardiovascular: Negative for chest pain and leg swelling.  Gastrointestinal: Negative for abdominal pain, blood in stool, diarrhea, nausea and vomiting.  Genitourinary:  Negative for difficulty urinating, flank pain, frequency and hematuria.       Leakage from Foley catheter bag  Musculoskeletal: Negative for arthralgias and back pain.  Skin: Negative for rash.  Neurological: Negative for dizziness, speech difficulty, weakness, numbness and headaches.    Physical Exam Updated Vital Signs BP 135/90   Pulse 72   SpO2 98%   Physical Exam Constitutional:      Appearance: He is well-developed.  HENT:     Head: Normocephalic and atraumatic.  Eyes:     Pupils: Pupils are equal, round, and reactive to light.  Cardiovascular:     Rate and Rhythm: Normal rate.  Pulmonary:     Effort: Pulmonary effort is normal.  Abdominal:      General: Bowel sounds are normal.     Palpations: Abdomen is soft.     Tenderness: There is no abdominal tenderness. There is no guarding or rebound.  Genitourinary:    Comments: Normal-appearing external male uncircumcised genitalia.  Foley catheter is in place.  I do not visualize any leakage around the urethral meatus. Musculoskeletal:        General: Normal range of motion.     Cervical back: Normal range of motion and neck supple.  Lymphadenopathy:     Cervical: No cervical adenopathy.  Skin:    General: Skin is warm and dry.     Findings: No rash.  Neurological:     Mental Status: He is alert and oriented to person, place, and time.     ED Results / Procedures / Treatments   Labs (all labs ordered are listed, but only abnormal results are displayed) Labs Reviewed - No data to display  EKG None  Radiology No results found.  Procedures Procedures   Medications Ordered in ED Medications - No data to display  ED Course  I have reviewed the triage vital signs and the nursing notes.  Pertinent labs & imaging results that were available during my care of the patient were reviewed by me and considered in my medical decision making (see chart for details).    MDM Rules/Calculators/A&P                          Patient's bag was changed out to a regular size Foley catheter bag.  He will follow-up with urology as previously planned.  Return precautions were given. Final Clinical Impression(s) / ED Diagnoses Final diagnoses:  Problem with Foley catheter, initial encounter [facility])    Rx / DC Orders ED Discharge Orders    None       Rolan Bucco, MD 09/11/20 1620

## 2020-09-16 DIAGNOSIS — N401 Enlarged prostate with lower urinary tract symptoms: Secondary | ICD-10-CM | POA: Diagnosis not present

## 2020-09-16 DIAGNOSIS — R338 Other retention of urine: Secondary | ICD-10-CM | POA: Diagnosis not present

## 2020-09-24 DIAGNOSIS — R338 Other retention of urine: Secondary | ICD-10-CM | POA: Diagnosis not present

## 2020-10-01 DIAGNOSIS — R338 Other retention of urine: Secondary | ICD-10-CM | POA: Diagnosis not present

## 2020-10-01 DIAGNOSIS — N401 Enlarged prostate with lower urinary tract symptoms: Secondary | ICD-10-CM | POA: Diagnosis not present

## 2020-10-05 ENCOUNTER — Encounter: Payer: Self-pay | Admitting: Neurology

## 2020-10-05 DIAGNOSIS — H539 Unspecified visual disturbance: Secondary | ICD-10-CM | POA: Diagnosis not present

## 2020-10-05 DIAGNOSIS — M316 Other giant cell arteritis: Secondary | ICD-10-CM | POA: Diagnosis not present

## 2020-10-05 DIAGNOSIS — R269 Unspecified abnormalities of gait and mobility: Secondary | ICD-10-CM | POA: Diagnosis not present

## 2020-10-05 DIAGNOSIS — J449 Chronic obstructive pulmonary disease, unspecified: Secondary | ICD-10-CM | POA: Diagnosis not present

## 2020-10-05 DIAGNOSIS — Z978 Presence of other specified devices: Secondary | ICD-10-CM | POA: Diagnosis not present

## 2020-10-05 DIAGNOSIS — I482 Chronic atrial fibrillation, unspecified: Secondary | ICD-10-CM | POA: Diagnosis not present

## 2020-10-05 DIAGNOSIS — Z79899 Other long term (current) drug therapy: Secondary | ICD-10-CM | POA: Diagnosis not present

## 2020-10-05 DIAGNOSIS — D649 Anemia, unspecified: Secondary | ICD-10-CM | POA: Diagnosis not present

## 2020-10-05 DIAGNOSIS — G629 Polyneuropathy, unspecified: Secondary | ICD-10-CM | POA: Diagnosis not present

## 2020-10-07 ENCOUNTER — Emergency Department (HOSPITAL_COMMUNITY): Payer: Medicare PPO

## 2020-10-07 ENCOUNTER — Inpatient Hospital Stay (HOSPITAL_COMMUNITY): Payer: Medicare PPO

## 2020-10-07 ENCOUNTER — Inpatient Hospital Stay (HOSPITAL_COMMUNITY)
Admission: EM | Admit: 2020-10-07 | Discharge: 2020-10-13 | DRG: 955 | Disposition: A | Discharge status: Rehabilitation Facility | Payer: Medicare PPO | Attending: Surgery | Admitting: Surgery

## 2020-10-07 ENCOUNTER — Encounter (HOSPITAL_COMMUNITY): Payer: Self-pay | Admitting: Emergency Medicine

## 2020-10-07 DIAGNOSIS — Y92009 Unspecified place in unspecified non-institutional (private) residence as the place of occurrence of the external cause: Secondary | ICD-10-CM

## 2020-10-07 DIAGNOSIS — Z7901 Long term (current) use of anticoagulants: Secondary | ICD-10-CM

## 2020-10-07 DIAGNOSIS — F32A Depression, unspecified: Secondary | ICD-10-CM | POA: Diagnosis not present

## 2020-10-07 DIAGNOSIS — Z79899 Other long term (current) drug therapy: Secondary | ICD-10-CM | POA: Diagnosis not present

## 2020-10-07 DIAGNOSIS — W19XXXA Unspecified fall, initial encounter: Secondary | ICD-10-CM

## 2020-10-07 DIAGNOSIS — K449 Diaphragmatic hernia without obstruction or gangrene: Secondary | ICD-10-CM | POA: Diagnosis not present

## 2020-10-07 DIAGNOSIS — R0902 Hypoxemia: Secondary | ICD-10-CM | POA: Diagnosis not present

## 2020-10-07 DIAGNOSIS — I712 Thoracic aortic aneurysm, without rupture, unspecified: Secondary | ICD-10-CM | POA: Insufficient documentation

## 2020-10-07 DIAGNOSIS — G629 Polyneuropathy, unspecified: Secondary | ICD-10-CM | POA: Insufficient documentation

## 2020-10-07 DIAGNOSIS — I4891 Unspecified atrial fibrillation: Secondary | ICD-10-CM | POA: Diagnosis not present

## 2020-10-07 DIAGNOSIS — S728X1A Other fracture of right femur, initial encounter for closed fracture: Secondary | ICD-10-CM | POA: Diagnosis not present

## 2020-10-07 DIAGNOSIS — S065XAA Traumatic subdural hemorrhage with loss of consciousness status unknown, initial encounter: Secondary | ICD-10-CM | POA: Diagnosis present

## 2020-10-07 DIAGNOSIS — K219 Gastro-esophageal reflux disease without esophagitis: Secondary | ICD-10-CM | POA: Insufficient documentation

## 2020-10-07 DIAGNOSIS — D62 Acute posthemorrhagic anemia: Secondary | ICD-10-CM | POA: Diagnosis present

## 2020-10-07 DIAGNOSIS — S0101XA Laceration without foreign body of scalp, initial encounter: Secondary | ICD-10-CM | POA: Diagnosis present

## 2020-10-07 DIAGNOSIS — R918 Other nonspecific abnormal finding of lung field: Secondary | ICD-10-CM | POA: Diagnosis not present

## 2020-10-07 DIAGNOSIS — S06A0XA Traumatic brain compression without herniation, initial encounter: Secondary | ICD-10-CM | POA: Diagnosis not present

## 2020-10-07 DIAGNOSIS — S72001A Fracture of unspecified part of neck of right femur, initial encounter for closed fracture: Secondary | ICD-10-CM | POA: Diagnosis present

## 2020-10-07 DIAGNOSIS — Z20822 Contact with and (suspected) exposure to covid-19: Secondary | ICD-10-CM | POA: Diagnosis not present

## 2020-10-07 DIAGNOSIS — M47814 Spondylosis without myelopathy or radiculopathy, thoracic region: Secondary | ICD-10-CM | POA: Diagnosis not present

## 2020-10-07 DIAGNOSIS — I77810 Thoracic aortic ectasia: Secondary | ICD-10-CM | POA: Insufficient documentation

## 2020-10-07 DIAGNOSIS — I959 Hypotension, unspecified: Secondary | ICD-10-CM | POA: Diagnosis not present

## 2020-10-07 DIAGNOSIS — Z9181 History of falling: Secondary | ICD-10-CM | POA: Diagnosis not present

## 2020-10-07 DIAGNOSIS — W07XXXA Fall from chair, initial encounter: Secondary | ICD-10-CM | POA: Diagnosis present

## 2020-10-07 DIAGNOSIS — H548 Legal blindness, as defined in USA: Secondary | ICD-10-CM | POA: Diagnosis present

## 2020-10-07 DIAGNOSIS — S0990XA Unspecified injury of head, initial encounter: Secondary | ICD-10-CM | POA: Diagnosis not present

## 2020-10-07 DIAGNOSIS — S72141A Displaced intertrochanteric fracture of right femur, initial encounter for closed fracture: Secondary | ICD-10-CM | POA: Diagnosis present

## 2020-10-07 DIAGNOSIS — J329 Chronic sinusitis, unspecified: Secondary | ICD-10-CM | POA: Diagnosis not present

## 2020-10-07 DIAGNOSIS — R410 Disorientation, unspecified: Secondary | ICD-10-CM | POA: Diagnosis present

## 2020-10-07 DIAGNOSIS — G8911 Acute pain due to trauma: Secondary | ICD-10-CM

## 2020-10-07 DIAGNOSIS — S065X9A Traumatic subdural hemorrhage with loss of consciousness of unspecified duration, initial encounter: Principal | ICD-10-CM | POA: Diagnosis present

## 2020-10-07 DIAGNOSIS — S069X3S Unspecified intracranial injury with loss of consciousness of 1 hour to 5 hours 59 minutes, sequela: Secondary | ICD-10-CM | POA: Diagnosis not present

## 2020-10-07 DIAGNOSIS — D6859 Other primary thrombophilia: Secondary | ICD-10-CM | POA: Insufficient documentation

## 2020-10-07 DIAGNOSIS — R58 Hemorrhage, not elsewhere classified: Secondary | ICD-10-CM | POA: Diagnosis not present

## 2020-10-07 DIAGNOSIS — R339 Retention of urine, unspecified: Secondary | ICD-10-CM | POA: Diagnosis not present

## 2020-10-07 DIAGNOSIS — M5030 Other cervical disc degeneration, unspecified cervical region: Secondary | ICD-10-CM | POA: Diagnosis not present

## 2020-10-07 DIAGNOSIS — D6832 Hemorrhagic disorder due to extrinsic circulating anticoagulants: Secondary | ICD-10-CM | POA: Diagnosis not present

## 2020-10-07 DIAGNOSIS — S069X2S Unspecified intracranial injury with loss of consciousness of 31 minutes to 59 minutes, sequela: Secondary | ICD-10-CM | POA: Diagnosis not present

## 2020-10-07 DIAGNOSIS — Z419 Encounter for procedure for purposes other than remedying health state, unspecified: Secondary | ICD-10-CM

## 2020-10-07 DIAGNOSIS — J90 Pleural effusion, not elsewhere classified: Secondary | ICD-10-CM | POA: Diagnosis not present

## 2020-10-07 DIAGNOSIS — F325 Major depressive disorder, single episode, in full remission: Secondary | ICD-10-CM | POA: Insufficient documentation

## 2020-10-07 DIAGNOSIS — S069X0D Unspecified intracranial injury without loss of consciousness, subsequent encounter: Secondary | ICD-10-CM | POA: Diagnosis not present

## 2020-10-07 DIAGNOSIS — M545 Low back pain, unspecified: Secondary | ICD-10-CM | POA: Diagnosis not present

## 2020-10-07 DIAGNOSIS — I739 Peripheral vascular disease, unspecified: Secondary | ICD-10-CM | POA: Diagnosis not present

## 2020-10-07 DIAGNOSIS — K5901 Slow transit constipation: Secondary | ICD-10-CM | POA: Diagnosis not present

## 2020-10-07 DIAGNOSIS — S22049A Unspecified fracture of fourth thoracic vertebra, initial encounter for closed fracture: Secondary | ICD-10-CM | POA: Diagnosis not present

## 2020-10-07 DIAGNOSIS — I700 Atherosclerosis of aorta: Secondary | ICD-10-CM | POA: Insufficient documentation

## 2020-10-07 DIAGNOSIS — S06360A Traumatic hemorrhage of cerebrum, unspecified, without loss of consciousness, initial encounter: Secondary | ICD-10-CM | POA: Diagnosis not present

## 2020-10-07 DIAGNOSIS — E7800 Pure hypercholesterolemia, unspecified: Secondary | ICD-10-CM | POA: Diagnosis not present

## 2020-10-07 DIAGNOSIS — R269 Unspecified abnormalities of gait and mobility: Secondary | ICD-10-CM | POA: Insufficient documentation

## 2020-10-07 DIAGNOSIS — I6200 Nontraumatic subdural hemorrhage, unspecified: Secondary | ICD-10-CM | POA: Diagnosis not present

## 2020-10-07 DIAGNOSIS — S065X9D Traumatic subdural hemorrhage with loss of consciousness of unspecified duration, subsequent encounter: Secondary | ICD-10-CM | POA: Diagnosis not present

## 2020-10-07 DIAGNOSIS — I351 Nonrheumatic aortic (valve) insufficiency: Secondary | ICD-10-CM | POA: Insufficient documentation

## 2020-10-07 DIAGNOSIS — S199XXA Unspecified injury of neck, initial encounter: Secondary | ICD-10-CM | POA: Diagnosis not present

## 2020-10-07 DIAGNOSIS — S7291XD Unspecified fracture of right femur, subsequent encounter for closed fracture with routine healing: Secondary | ICD-10-CM | POA: Diagnosis not present

## 2020-10-07 DIAGNOSIS — I629 Nontraumatic intracranial hemorrhage, unspecified: Secondary | ICD-10-CM

## 2020-10-07 DIAGNOSIS — Z87891 Personal history of nicotine dependence: Secondary | ICD-10-CM

## 2020-10-07 DIAGNOSIS — M316 Other giant cell arteritis: Secondary | ICD-10-CM | POA: Diagnosis not present

## 2020-10-07 DIAGNOSIS — R414 Neurologic neglect syndrome: Secondary | ICD-10-CM | POA: Diagnosis not present

## 2020-10-07 DIAGNOSIS — M4314 Spondylolisthesis, thoracic region: Secondary | ICD-10-CM | POA: Diagnosis not present

## 2020-10-07 DIAGNOSIS — E8809 Other disorders of plasma-protein metabolism, not elsewhere classified: Secondary | ICD-10-CM | POA: Diagnosis not present

## 2020-10-07 DIAGNOSIS — J449 Chronic obstructive pulmonary disease, unspecified: Secondary | ICD-10-CM | POA: Diagnosis not present

## 2020-10-07 DIAGNOSIS — S065X0A Traumatic subdural hemorrhage without loss of consciousness, initial encounter: Secondary | ICD-10-CM | POA: Diagnosis not present

## 2020-10-07 DIAGNOSIS — M47812 Spondylosis without myelopathy or radiculopathy, cervical region: Secondary | ICD-10-CM | POA: Diagnosis not present

## 2020-10-07 DIAGNOSIS — R911 Solitary pulmonary nodule: Secondary | ICD-10-CM | POA: Insufficient documentation

## 2020-10-07 DIAGNOSIS — M4134 Thoracogenic scoliosis, thoracic region: Secondary | ICD-10-CM | POA: Diagnosis not present

## 2020-10-07 DIAGNOSIS — I480 Paroxysmal atrial fibrillation: Secondary | ICD-10-CM | POA: Diagnosis present

## 2020-10-07 DIAGNOSIS — S069X9A Unspecified intracranial injury with loss of consciousness of unspecified duration, initial encounter: Secondary | ICD-10-CM | POA: Diagnosis not present

## 2020-10-07 DIAGNOSIS — K5900 Constipation, unspecified: Secondary | ICD-10-CM | POA: Diagnosis not present

## 2020-10-07 DIAGNOSIS — S069X0S Unspecified intracranial injury without loss of consciousness, sequela: Secondary | ICD-10-CM | POA: Diagnosis not present

## 2020-10-07 HISTORY — DX: Unspecified atrial fibrillation: I48.91

## 2020-10-07 HISTORY — DX: Other primary thrombophilia: D68.59

## 2020-10-07 HISTORY — DX: Thoracic aortic aneurysm, without rupture, unspecified: I71.20

## 2020-10-07 HISTORY — DX: Pure hypercholesterolemia, unspecified: E78.00

## 2020-10-07 HISTORY — DX: Other specified disorders of bone density and structure, unspecified site: M85.80

## 2020-10-07 HISTORY — DX: Atherosclerosis of aorta: I70.0

## 2020-10-07 LAB — BASIC METABOLIC PANEL
Anion gap: 7 (ref 5–15)
BUN: 23 mg/dL (ref 8–23)
CO2: 24 mmol/L (ref 22–32)
Calcium: 8.5 mg/dL — ABNORMAL LOW (ref 8.9–10.3)
Chloride: 108 mmol/L (ref 98–111)
Creatinine, Ser: 1.04 mg/dL (ref 0.61–1.24)
GFR, Estimated: >60 mL/min (ref >60)
Glucose, Bld: 113 mg/dL — ABNORMAL HIGH (ref 70–99)
Potassium: 4.5 mmol/L (ref 3.5–5.1)
Sodium: 139 mmol/L (ref 135–145)

## 2020-10-07 LAB — CBC WITH DIFFERENTIAL/PLATELET
Abs Immature Granulocytes: 0.04 10*3/uL (ref 0.00–0.07)
Basophils Absolute: 0 10*3/uL (ref 0.0–0.1)
Basophils Relative: 0 %
Eosinophils Absolute: 0.1 10*3/uL (ref 0.0–0.5)
Eosinophils Relative: 1 %
HCT: 31.6 % — ABNORMAL LOW (ref 39.0–52.0)
Hemoglobin: 9.7 g/dL — ABNORMAL LOW (ref 13.0–17.0)
Immature Granulocytes: 0 %
Lymphocytes Relative: 7 %
Lymphs Abs: 0.7 10*3/uL (ref 0.7–4.0)
MCH: 29.7 pg (ref 26.0–34.0)
MCHC: 30.7 g/dL (ref 30.0–36.0)
MCV: 96.6 fL (ref 80.0–100.0)
Monocytes Absolute: 0.5 10*3/uL (ref 0.1–1.0)
Monocytes Relative: 5 %
Neutro Abs: 8.2 10*3/uL — ABNORMAL HIGH (ref 1.7–7.7)
Neutrophils Relative %: 87 %
Platelets: 232 10*3/uL (ref 150–400)
RBC: 3.27 MIL/uL — ABNORMAL LOW (ref 4.22–5.81)
RDW: 15.9 % — ABNORMAL HIGH (ref 11.5–15.5)
WBC: 9.6 10*3/uL (ref 4.0–10.5)
nRBC: 0 % (ref 0.0–0.2)

## 2020-10-07 LAB — RESP PANEL BY RT-PCR (FLU A&B, COVID) ARPGX2
Influenza A by PCR: NEGATIVE
Influenza B by PCR: NEGATIVE
SARS Coronavirus 2 by RT PCR: NEGATIVE

## 2020-10-07 LAB — TYPE AND SCREEN
ABO/RH(D): A NEG
Antibody Screen: NEGATIVE

## 2020-10-07 MED ORDER — FOLIC ACID 1 MG PO TABS
500.0000 ug | ORAL_TABLET | Freq: Every day | ORAL | Status: DC
  Administered 2020-10-08 – 2020-10-13 (×6): 0.5 mg via ORAL
  Filled 2020-10-07 (×7): qty 1

## 2020-10-07 MED ORDER — FENTANYL CITRATE (PF) 100 MCG/2ML IJ SOLN
100.0000 ug | Freq: Once | INTRAMUSCULAR | Status: AC
  Administered 2020-10-07: 100 ug via INTRAVENOUS
  Filled 2020-10-07: qty 2

## 2020-10-07 MED ORDER — ATORVASTATIN CALCIUM 10 MG PO TABS
10.0000 mg | ORAL_TABLET | Freq: Every day | ORAL | Status: DC
  Administered 2020-10-08 – 2020-10-13 (×6): 10 mg via ORAL
  Filled 2020-10-07 (×6): qty 1

## 2020-10-07 MED ORDER — ESCITALOPRAM OXALATE 10 MG PO TABS
20.0000 mg | ORAL_TABLET | Freq: Every day | ORAL | Status: DC
  Administered 2020-10-08 – 2020-10-13 (×6): 20 mg via ORAL
  Filled 2020-10-07 (×6): qty 2

## 2020-10-07 MED ORDER — ONDANSETRON HCL 4 MG PO TABS: 4.0000 mg | ORAL_TABLET | Freq: Four times a day (QID) | ORAL | Status: DC | PRN

## 2020-10-07 MED ORDER — HYDROMORPHONE HCL 1 MG/ML IJ SOLN
0.5000 mg | INTRAMUSCULAR | Status: DC | PRN
  Administered 2020-10-07: 0.5 mg via INTRAVENOUS
  Filled 2020-10-07 (×2): qty 1

## 2020-10-07 MED ORDER — PROTHROMBIN COMPLEX CONC HUMAN 1000 UNITS IV KIT
4200.0000 [IU] | PACK | Status: AC
  Administered 2020-10-08: 4200 [IU] via INTRAVENOUS
  Filled 2020-10-07: qty 4200

## 2020-10-07 MED ORDER — OXYCODONE HCL 5 MG PO TABS
5.0000 mg | ORAL_TABLET | ORAL | Status: DC | PRN
  Administered 2020-10-08: 5 mg via ORAL
  Filled 2020-10-07: qty 1

## 2020-10-07 MED ORDER — HEPARIN SODIUM (PORCINE) 5000 UNIT/ML IJ SOLN: 5000.0000 [IU] | Freq: Three times a day (TID) | INTRAMUSCULAR | Status: DC

## 2020-10-07 MED ORDER — TAMSULOSIN HCL 0.4 MG PO CAPS
0.8000 mg | ORAL_CAPSULE | Freq: Every day | ORAL | Status: DC
  Administered 2020-10-09 – 2020-10-12 (×4): 0.8 mg via ORAL
  Filled 2020-10-07 (×4): qty 2

## 2020-10-07 MED ORDER — PANTOPRAZOLE SODIUM 40 MG PO TBEC
40.0000 mg | DELAYED_RELEASE_TABLET | Freq: Every day | ORAL | Status: DC
  Administered 2020-10-08 – 2020-10-13 (×6): 40 mg via ORAL
  Filled 2020-10-07 (×6): qty 1

## 2020-10-07 MED ORDER — SODIUM CHLORIDE 0.9 % IV SOLN: INTRAVENOUS | Status: DC

## 2020-10-07 MED ORDER — ACETAMINOPHEN 325 MG PO TABS
650.0000 mg | ORAL_TABLET | Freq: Four times a day (QID) | ORAL | Status: DC | PRN
  Administered 2020-10-08 – 2020-10-12 (×3): 650 mg via ORAL
  Filled 2020-10-07 (×5): qty 2

## 2020-10-07 MED ORDER — ONDANSETRON HCL 4 MG/2ML IJ SOLN
4.0000 mg | Freq: Four times a day (QID) | INTRAMUSCULAR | Status: DC | PRN
  Administered 2020-10-08 – 2020-10-09 (×2): 4 mg via INTRAVENOUS
  Filled 2020-10-07 (×2): qty 2

## 2020-10-07 MED ORDER — FENTANYL CITRATE (PF) 100 MCG/2ML IJ SOLN
50.0000 ug | Freq: Once | INTRAMUSCULAR | Status: AC
  Administered 2020-10-07: 50 ug via INTRAVENOUS
  Filled 2020-10-07: qty 2

## 2020-10-07 MED ORDER — ACETAMINOPHEN 650 MG RE SUPP: 650.0000 mg | Freq: Four times a day (QID) | RECTAL | Status: DC | PRN

## 2020-10-07 MED ORDER — POLYETHYLENE GLYCOL 3350 17 G PO PACK
17.0000 g | PACK | Freq: Every day | ORAL | Status: DC | PRN
  Administered 2020-10-10 – 2020-10-12 (×2): 17 g via ORAL
  Filled 2020-10-07 (×2): qty 1

## 2020-10-07 NOTE — Progress Notes (Signed)
Patient was seen an evaluated at bedside. Upon initial consult from ED, I rec'd CT head due to patient's age, known head trauma, and anticoagulated state. CT head shows large subdural, and neurosurgery was consulted by EDP. They recommend admission to ICU for close monitoring.   Brief history - Patient is nearly completely blind and experienced a mechanical fall when trying to to get out of a chair earlier today. It was an unwitnessed fall, but he did have a bleeding head wound. Patient denies LOC. He had sudden pain and was unable to get up on his own. ED workup revealed intertrochanteric proximal femur fracture on right. Ortho was consulted and rec'd transfer to [facility] for expedited surgical intervention. Head wound was repaired in ED. Patient denies any change in hearing, he has 20% of his vision in his right eye, and denies any change in that. Patient has equal sensation and strength in his upper extremities. Face is symmetric. Language is normal. 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 Sensation above the ankle is equal in the BL lower extremities. Patient is alert and oriented x 3. Wife is at bedside - we discussed transfer to [facility], and the fact that he will need evaluation with neuro surgery. Patient and wife had all their questions answered, and I attempted to talk to son by phone as well. Son, was not available to answer call.   Thank you for involving [facility] hospitalists in the care of this patient. Given admission to ICU, [facility] hospitalists will sign off for now. Please reach out if re-consult is needed.

## 2020-10-07 NOTE — Consult Note (Signed)
Neurosurgery Consultation  Reason for Consult: Subdural hematoma Referring Physician: Freida Busman  CC: R hip pain  HPI: This is a 87 y.o. man that presents after a mechanical fall. He has a history of GCA w/ resulting blindness, has an indwelling foley that his wife suspects he tripped over. Since the fall, he has been worse than his cognitive baseline, wife describes him as 'goofy' as well as severe right hip pain. He denies any headaches / N / V, he was on rivaroxaban for PAF, which was reversed w/ [facility] prior to transfer.   ROS: A 14 point ROS was performed and is negative except as noted in the HPI.   PMHx:  Past Medical History:  Diagnosis Date   Aortic atherosclerosis (HCC)    Atrial fibrillation (HCC)    Giant cell arteritis (HCC)    Osteopenia    Pure hypercholesterolemia    Thoracic aortic aneurysm (HCC)    Thrombophilia (HCC)    FamHx: No family history on file. SocHx:  reports that he has quit smoking. He has never used smokeless tobacco. He reports current alcohol use of about 3.0 standard drinks of alcohol per week. He reports that he does not use drugs.  Exam: Vital signs in last 24 hours: Temp:  [98.1 F (36.7 C)-99.5 F (37.5 C)] 99.5 F (37.5 C) (06/09 0418) Pulse Rate:  [74-123] 100 (06/09 0800) Resp:  [10-25] 15 (06/09 0800) BP: (84-155)/(66-141) 109/76 (06/09 0800) SpO2:  [93 %-100 %] 99 % (06/09 0800) Weight:  [78.1 kg] 78.1 kg (06/09 0418) General: Awake, alert, cooperative, lying in bed in NAD Head: Normocephalic, some cutaneous stigmata of cranial injury HEENT: Neck supple Pulmonary: breathing room air comfortably, no evidence of increased work of breathing Psych: affect flat Abdomen: S NT ND Extremities: Warm and well perfused x4, RLE externally rotated and painful Neuro: Aox1 (2017 and someone's house), PERRL, EOMI, FS Strength 5/5 on L, 4/5 in RUE w/ +RUE pronator drift, RLE pain limited but FC in RLE, stocking > glove diffuse symmetric  numbness   Assessment and Plan: 87 y.o. man s/p fall w/ SDH, hip frx. CTH personally reviewed, which shows a large mixed density left SDH of roughly 2.5cm thickness and 5-14mm of midline shift. Exam notable for some right sided upper extremity weakness and +pronator drift.  -Given the stable CTH, okay to go to the OR today for the fracture repair. Given the size of the subdural and his R sided weakness, will need a burr hole drainage of the subdural, which I think is better to be done tomorrow. Discussed w/ the patient's wife that we'll lose his exam during the procedure, but that the hip needs to be fixed. Doing the burr hole second will give a little bit more time for the rivaroxaban to clear. Additionally, much more likely to have symptomatic expansion / re-hemorrhage in the post-op period than preop period so I think doing the burr hole second is probably best. -please call with any concerns or questions  Jadene Pierini, MD 10/08/20 8:45 AM [facility]

## 2020-10-07 NOTE — Progress Notes (Signed)
Patient ID: Nathan Mitchell, male   DOB: Nov 12, 1930, 87 y.o.   MRN: [id number] I was called by Dr. Effie Shy to evaluate and treat this nice gentleman's right intertrochanteric hip fracture.  This is per request of the patient's family.  Unfortunately, my schedule is full in terms of not being able to expedite surgery on his right hip by me.  The first time I would be able to get him on my schedule at [facility] long would be late Friday toward the late afternoon or early evening.  I have talked to my partner Dr. Roda Shutters who has agreed to manage this patient's fracture from an orthopedic standpoint.  He has the potential of getting him on the schedule at [facility] tomorrow afternoon which would be better for the patient.  I have requested that Dr. Effie Shy facilitate transfer of this patient to the hospitalist service at [facility] in anticipation of surgery tomorrow.

## 2020-10-07 NOTE — ED Provider Notes (Addendum)
[facility]-EMERGENCY DEPT Provider Note   CSN: [id number] Arrival date & time: 10/07/20  1655     History Chief Complaint  Patient presents with  . Fall  . Hip Pain    [facility] is a 87 y.o. male.  HPI Patient presents by EMS for evaluation of right hip pain.  He states that he "tripped over my feet."  This occurred when he was at home.  His wife was with him and summoned EMS.  He was unable to get up because of right hip pain.  He bumped his head, but denies headache or neck pain.  He denies chest or back pain.  There was no other prodrome.  He denies recent illnesses.  Patient cannot give full history, level 5 caveat    Past Medical History:  Diagnosis Date  . Giant cell arteritis [facility])     Patient Active Problem List   Diagnosis Date Noted  . Closed right hip fracture (HCC) 10/07/2020  . Abnormal gait 10/07/2020  . Aortic regurgitation 10/07/2020  . Aortic root dilatation (HCC) 10/07/2020  . Atrial fibrillation (HCC) 10/07/2020  . Chronic obstructive pulmonary disease, unspecified (HCC) 10/07/2020  . Gastroesophageal reflux disease 10/07/2020  . Hardening of the aorta (main artery of the heart) (HCC) 10/07/2020  . Major depression in remission (HCC) 10/07/2020  . Peripheral neuropathy 10/07/2020  . Pure hypercholesterolemia 10/07/2020  . Solitary pulmonary nodule 10/07/2020  . Thoracic aortic aneurysm without rupture (HCC) 10/07/2020  . Thrombophilia (HCC) 10/07/2020  . Entropion of left lower eyelid 07/01/2014  . High risk medication use 02/25/2014  . 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 Posterior capsular opacification 06/03/2013  . Encounter for long-term (current) use of other medications 05/28/2013  . Dyslipidemia 01/15/2013  . Other giant cell arteritis (HCC) 06/07/2012  . Vision loss 04/29/2012    History reviewed. No pertinent surgical history.     No family history on  file.  Social History   Tobacco Use  . Smoking status: Former Games developer  . Smokeless tobacco: Never Used  Vaping Use  . Vaping Use: Never used  Substance Use Topics  . Alcohol use: Yes    Alcohol/week: 3.0 standard drinks    Types: 3 Glasses of wine per week  . Drug use: No    Home Medications Prior to Admission medications   Medication Sig Start Date End Date Taking? Authorizing Provider  atorvastatin (LIPITOR) 10 MG tablet Take 10 mg by mouth daily.    [provider]  cholecalciferol (VITAMIN D) 1000 units tablet Take 1,000 Units by mouth daily.    [provider]  escitalopram (LEXAPRO) 20 MG tablet Take 20 mg by mouth daily.    [provider]  ferrous gluconate (FERGON) 240 (27 FE) MG tablet Take 240 mg by mouth every Monday, Wednesday, and Friday.    [provider]  folic acid (FOLVITE) 400 MCG tablet Take 400 mcg by mouth daily.  12/02/14   [provider]  HYDROcodone-acetaminophen (NORCO/VICODIN) 5-325 MG tablet Take 1 tablet by mouth every 6 (six) hours as needed. 06/07/15   Gwyneth Sprout, MD  methotrexate (RHEUMATREX) 2.5 MG tablet Take 20 mg by mouth every Saturday.  04/24/15   [provider]  Omeprazole 20 MG TBEC Take 20 mg by mouth daily.     [provider]  rivaroxaban (XARELTO) 20 MG TABS tablet Take 1 tablet (20 mg total) by mouth daily with supper. 12/16/17  Law, Waylan Boga, PA-C  tamsulosin (FLOMAX) 0.4 MG CAPS capsule Take 0.8 mg by mouth daily after supper.    [provider]  vitamin B-12 (CYANOCOBALAMIN) 1000 MCG tablet Take 1,000 mcg by mouth daily.    [provider]    Allergies    Patient has no known allergies.  Review of Systems   Review of Systems  Unable to perform ROS: Mental status change    Physical Exam Updated Vital Signs BP 122/78   Pulse (!) 104   Temp 98.2 F (36.8 C) (Oral)   Resp 16   SpO2 98%   Physical Exam Vitals and nursing note reviewed.   Constitutional:      General: He is in acute distress.     Appearance: He is well-developed. He is not ill-appearing, toxic-appearing or diaphoretic.  HENT:     Head: Normocephalic.     Comments: Small midfrontal laceration, bleeding slightly.  No associated crepitation or deformity.    Right Ear: External ear normal.     Left Ear: External ear normal.     Mouth/Throat:     Mouth: Mucous membranes are moist.     Pharynx: No oropharyngeal exudate or posterior oropharyngeal erythema.  Eyes:     Conjunctiva/sclera: Conjunctivae normal.     Pupils: Pupils are equal, round, and reactive to light.  Neck:     Trachea: Phonation normal.  Cardiovascular:     Rate and Rhythm: Normal rate and regular rhythm.     Heart sounds: Normal heart sounds.  Pulmonary:     Effort: Pulmonary effort is normal. No respiratory distress.     Breath sounds: Normal breath sounds. No stridor.  Abdominal:     General: There is no distension.     Palpations: Abdomen is soft. There is no mass.     Tenderness: There is no abdominal tenderness.  Musculoskeletal:     Cervical back: Normal range of motion and neck supple.     Comments: Right leg is shortened, and externally rotated.  Right hip is tender to palpation.  No other extremity abnormality.  Skin:    General: Skin is warm and dry.  Neurological:     Mental Status: He is alert and oriented to person, place, and time.     Cranial Nerves: No cranial nerve deficit.     Sensory: No sensory deficit.     Motor: No abnormal muscle tone.     Coordination: Coordination normal.     Comments: No dysarthria or aphasia.  Psychiatric:        Mood and Affect: Mood normal.        Behavior: Behavior normal.        Thought Content: Thought content normal.        Judgment: Judgment normal.     ED Results / Procedures / Treatments   Labs (all labs ordered are listed, but only abnormal results are displayed) Labs Reviewed  CBC WITH DIFFERENTIAL/PLATELET -  Abnormal; Notable for the following components:      Result Value   RBC 3.27 (*)    Hemoglobin 9.7 (*)    HCT 31.6 (*)    RDW 15.9 (*)    Neutro Abs 8.2 (*)    All other components within normal limits  BASIC METABOLIC PANEL - Abnormal; Notable for the following components:   Glucose, Bld 113 (*)    Calcium 8.5 (*)    All other components within normal limits  URINALYSIS, ROUTINE W REFLEX MICROSCOPIC -  Abnormal; Notable for the following components:   APPearance HAZY (*)    Hgb urine dipstick LARGE (*)    Ketones, ur 20 (*)    Leukocytes,Ua LARGE (*)    RBC / HPF >50 (*)    Bacteria, UA RARE (*)    All other components within normal limits  RESP PANEL BY RT-PCR (FLU A&B, COVID) ARPGX2  MAGNESIUM  COMPREHENSIVE METABOLIC PANEL  CBC  CBC  PROTIME-INR  CBC  TYPE AND SCREEN  ABO/RH    EKG EKG Interpretation  Date/Time:  Wednesday October 07 2020 19:06:00 EDT Ventricular Rate:  84 PR Interval:    QRS Duration: 144 QT Interval:  436 QTC Calculation: 516 R Axis:   -29 Text Interpretation: Atrial fibrillation Right bundle branch block since last tracing no significant change Confirmed by Mancel Bale [phone]) on 10/08/2020 12:26:59 AM   Radiology DG Chest 1 View  Result Date: 10/07/2020 CLINICAL DATA:  Right hip fracture. EXAM: CHEST  1 VIEW COMPARISON:  July 28, 2015. FINDINGS: The heart size and mediastinal contours are within normal limits. Both lungs are clear. Moderate size hiatal hernia is noted. The visualized skeletal structures are unremarkable. IMPRESSION: No acute cardiopulmonary abnormality seen. Moderate size hiatal hernia. Aortic Atherosclerosis (ICD10-I70.0). Electronically Signed   By: Lupita Raider M.D.   On: 10/07/2020 18:52   CT Head Wo Contrast  Result Date: 10/07/2020 CLINICAL DATA:  Fall, head injury, cervical spine injury, chronic anticoagulation EXAM: CT HEAD WITHOUT CONTRAST CT CERVICAL SPINE WITHOUT CONTRAST TECHNIQUE: Multidetector CT imaging of the  head and cervical spine was performed following the standard protocol without intravenous contrast. Multiplanar CT image reconstructions of the cervical spine were also generated. COMPARISON:  None. FINDINGS: CT HEAD FINDINGS Brain: A large left subdural hematoma overlies the left cerebral convexity with mixed attenuation in keeping with hemorrhage of varying attenuation. This measures up to 26 mm in thickness on coronal image # 48/5. There is significant mass effect upon the left cerebral hemisphere. There is resultant effacement of the overlying sulci and 7 mm left-to-right midline shift. The left lateral ventricle is mildly effaced. The right lateral ventricle is of normal size there is no evidence of entrapment. The suprasellar cistern is preserved. There is moderate parenchymal volume loss commensurate with the patient's age. Remote lacunar infarct within the left thalamus. Mild periventricular white matter changes are present likely reflecting the sequela of small vessel ischemia. Ventricular size is normal. Cerebellum is unremarkable. Vascular: No hyperdense vessel or unexpected calcification. Skull: Intact Sinuses/Orbits: Moderate bubbly secretions within the right frontal sinus. No air-fluid levels. Remaining paranasal sinuses are clear. The orbits are unremarkable. Other: Mastoid air cells and middle ear cavities are clear. Mild frontal scalp soft tissue swelling noted at the vertex. CT CERVICAL SPINE FINDINGS Alignment: Normal cervical lordosis. 1-2 mm anterolisthesis of C5 upon C6 is likely degenerative in nature. Skull base and vertebrae: The craniocervical alignment is normal. Advanced degenerative changes are seen at the atlantodental articulation. No superimposed widening. No acute fracture of the cervical spine. Vertebral body height has been preserved. Soft tissues and spinal canal: No prevertebral fluid or swelling. No visible canal hematoma. Disc levels: There is degenerative calcification of the  intervertebral discs throughout the cervical spine in keeping with changes of mild degenerative disc disease. Intervertebral disc height has been preserved. The prevertebral soft tissues are not thickened on sagittal reformats. Review of the axial images demonstrates multilevel uncovertebral and facet arthrosis resulting in multilevel moderate to severe neuroforaminal narrowing, most  severe bilaterally at C3-4 and C4-5. Upper chest: Patchy ground-glass infiltrate within the lung apices is not fully assessed on this examination, but was better assessed on prior examination of 12/25/2019. Other: Extensive atherosclerotic calcification is noted within the carotid bifurcations bilaterally. IMPRESSION: Large left subdural hematoma overlying the left cerebral convexity demonstrating significant mass effect upon the left cerebral hemisphere with effacement of the left lateral ventricle, effacement of the overlying sulci, and resultant 7 mm left-to-right midline shift. Heterogeneous attenuation suggests hemorrhage at multiple points in time, as can be seen with chronic anticoagulation. Mild senescent change. Remote lacunar infarct within the left thalamus. No acute fracture or listhesis of the cervical spine. Multilevel degenerative disc and degenerative joint disease resulting in moderate to severe neuroforaminal narrowing at C3-4 and C4-5. Extensive atherosclerotic calcification within the carotid bifurcations bilaterally. The degree of stenosis would be better assessed with dedicated sonography if indicated. Patchy ground-glass pulmonary infiltrate within the lung apices bilaterally, not fully assessed on this exam. This was better assessed on CT examination of 12/25/2019. These results were called by telephone at the time of interpretation on 10/07/2020 at 9:43 pm to provider [facility] , who verbally acknowledged these results. Electronically Signed   By: Helyn Numbers MD   On: 10/07/2020 21:43   CT Cervical Spine  Wo Contrast  Result Date: 10/07/2020 CLINICAL DATA:  Fall, head injury, cervical spine injury, chronic anticoagulation EXAM: CT HEAD WITHOUT CONTRAST CT CERVICAL SPINE WITHOUT CONTRAST TECHNIQUE: Multidetector CT imaging of the head and cervical spine was performed following the standard protocol without intravenous contrast. Multiplanar CT image reconstructions of the cervical spine were also generated. COMPARISON:  None. FINDINGS: CT HEAD FINDINGS Brain: A large left subdural hematoma overlies the left cerebral convexity with mixed attenuation in keeping with hemorrhage of varying attenuation. This measures up to 26 mm in thickness on coronal image # 48/5. There is significant mass effect upon the left cerebral hemisphere. There is resultant effacement of the overlying sulci and 7 mm left-to-right midline shift. The left lateral ventricle is mildly effaced. The right lateral ventricle is of normal size there is no evidence of entrapment. The suprasellar cistern is preserved. There is moderate parenchymal volume loss commensurate with the patient's age. Remote lacunar infarct within the left thalamus. Mild periventricular white matter changes are present likely reflecting the sequela of small vessel ischemia. Ventricular size is normal. Cerebellum is unremarkable. Vascular: No hyperdense vessel or unexpected calcification. Skull: Intact Sinuses/Orbits: Moderate bubbly secretions within the right frontal sinus. No air-fluid levels. Remaining paranasal sinuses are clear. The orbits are unremarkable. Other: Mastoid air cells and middle ear cavities are clear. Mild frontal scalp soft tissue swelling noted at the vertex. CT CERVICAL SPINE FINDINGS Alignment: Normal cervical lordosis. 1-2 mm anterolisthesis of C5 upon C6 is likely degenerative in nature. Skull base and vertebrae: The craniocervical alignment is normal. Advanced degenerative changes are seen at the atlantodental articulation. No superimposed widening. No  acute fracture of the cervical spine. Vertebral body height has been preserved. Soft tissues and spinal canal: No prevertebral fluid or swelling. No visible canal hematoma. Disc levels: There is degenerative calcification of the intervertebral discs throughout the cervical spine in keeping with changes of mild degenerative disc disease. Intervertebral disc height has been preserved. The prevertebral soft tissues are not thickened on sagittal reformats. Review of the axial images demonstrates multilevel uncovertebral and facet arthrosis resulting in multilevel moderate to severe neuroforaminal narrowing, most severe bilaterally at C3-4 and C4-5. Upper chest: Patchy ground-glass  infiltrate within the lung apices is not fully assessed on this examination, but was better assessed on prior examination of 12/25/2019. Other: Extensive atherosclerotic calcification is noted within the carotid bifurcations bilaterally. IMPRESSION: Large left subdural hematoma overlying the left cerebral convexity demonstrating significant mass effect upon the left cerebral hemisphere with effacement of the left lateral ventricle, effacement of the overlying sulci, and resultant 7 mm left-to-right midline shift. Heterogeneous attenuation suggests hemorrhage at multiple points in time, as can be seen with chronic anticoagulation. Mild senescent change. Remote lacunar infarct within the left thalamus. No acute fracture or listhesis of the cervical spine. Multilevel degenerative disc and degenerative joint disease resulting in moderate to severe neuroforaminal narrowing at C3-4 and C4-5. Extensive atherosclerotic calcification within the carotid bifurcations bilaterally. The degree of stenosis would be better assessed with dedicated sonography if indicated. Patchy ground-glass pulmonary infiltrate within the lung apices bilaterally, not fully assessed on this exam. This was better assessed on CT examination of 12/25/2019. These results were  called by telephone at the time of interpretation on 10/07/2020 at 9:43 pm to provider [facility] , who verbally acknowledged these results. Electronically Signed   By: Helyn Numbers MD   On: 10/07/2020 21:43   DG HIP UNILAT WITH PELVIS 2-3 VIEWS RIGHT  Result Date: 10/07/2020 CLINICAL DATA:  Right hip deformity after fall. EXAM: DG HIP (WITH OR WITHOUT PELVIS) 2-3V RIGHT COMPARISON:  None. FINDINGS: Severely comminuted and displaced fracture is seen involving the intertrochanteric region of proximal right femur. IMPRESSION: Severely comminuted and displaced intertrochanteric fracture of proximal right femur. Electronically Signed   By: Lupita Raider M.D.   On: 10/07/2020 18:51    Procedures .Marland KitchenLaceration Repair  Date/Time: 10/07/2020 11:31 PM Performed by: Mancel Bale, MD Authorized by: Mancel Bale, MD   Consent:    Consent obtained:  Verbal   Consent given by:  Patient   Risks discussed:  Infection, pain and poor cosmetic result Universal protocol:    Immediately prior to procedure, a time out was called: yes     Patient identity confirmed:  Verbally with patient Anesthesia:    Anesthesia method:  None Laceration details:    Location:  Scalp   Length (cm):  3   Depth (mm):  8 Exploration:    Limited defect created (wound extended): no     Hemostasis achieved with:  Direct pressure Treatment:    Area cleansed with:  Saline   Amount of cleaning:  Standard   Debridement:  None Skin repair:    Repair method:  Tissue adhesive Approximation:    Approximation:  Loose Post-procedure details:    Dressing:  Open (no dressing)   Procedure completion:  Tolerated well, no immediate complications  .Critical Care Performed by: Mancel Bale, MD Authorized by: Mancel Bale, MD   Critical care provider statement:    Critical care time (minutes):  130   Critical care start time:  10/07/2020 5:29 PM   Critical care end time:  10/07/2020 11:37 PM   Critical care time was  exclusive of:  Separately billable procedures and treating other patients   Critical care was necessary to treat or prevent imminent or life-threatening deterioration of the following conditions:  Trauma   Critical care was time spent personally by me on the following activities:  Blood draw for specimens, development of treatment plan with patient or surrogate, discussions with consultants, evaluation of patient's response to treatment, examination of patient, obtaining history from patient or surrogate, ordering and performing treatments  and interventions, ordering and review of laboratory studies, pulse oximetry, re-evaluation of patient's condition, review of old charts and ordering and review of radiographic studies     Medications Ordered in ED Medications  0.9 %  sodium chloride infusion ( Intravenous New Bag/Given 10/07/20 1804)  atorvastatin (LIPITOR) tablet 10 mg (has no administration in time range)  escitalopram (LEXAPRO) tablet 20 mg (has no administration in time range)  pantoprazole (PROTONIX) EC tablet 40 mg (has no administration in time range)  tamsulosin (FLOMAX) capsule 0.8 mg (has no administration in time range)  folic acid (FOLVITE) tablet 400 mcg (has no administration in time range)  acetaminophen (TYLENOL) tablet 650 mg (has no administration in time range)    Or  acetaminophen (TYLENOL) suppository 650 mg (has no administration in time range)  oxyCODONE (Oxy IR/ROXICODONE) immediate release tablet 5 mg (5 mg Oral Given 10/08/20 0016)  HYDROmorphone (DILAUDID) injection 0.5 mg (0.5 mg Intravenous Given 10/07/20 2239)  ondansetron (ZOFRAN) tablet 4 mg (has no administration in time range)    Or  ondansetron (ZOFRAN) injection 4 mg (has no administration in time range)  polyethylene glycol (MIRALAX / GLYCOLAX) packet 17 g (has no administration in time range)  Prothrombin Complex Conc Human IVPB 4,200 Units (4,200 Units Intravenous New Bag/Given 10/08/20 0011)  fentaNYL  (SUBLIMAZE) injection 50 mcg (50 mcg Intravenous Given 10/07/20 1849)  fentaNYL (SUBLIMAZE) injection 100 mcg (100 mcg Intravenous Given 10/07/20 1954)    ED Course  I have reviewed the triage vital signs and the nursing notes.  Pertinent labs & imaging results that were available during my care of the patient were reviewed by me and considered in my medical decision making (see chart for details).  Clinical Course as of 10/08/20 0027  Wed Oct 07, 2020  2151 Discussed head CT results with radiologist.  Patient has mixed attenuation subdural, with shift.  Note that he is anticoagulated with Xarelto. [EW]  2157 Last dose of Xarelto was between 10 PM and 11 PM last night.  Kcentra ordered to reverse Xarelto anticoagulation. [EW]  2215 Case discussed with Dr. Freida BusmanAllen, intensivist[facility].  She request that the [facility], where she will see him and arrange for admission. [EW]  2334 I discussed the case with Dr. Audley HoseHong, emergency physician at [facility], who understands the patient will be coming there to see Dr. Freida BusmanAllen, traumatologist. [EW]    Clinical Course User Index [EW] Mancel BaleWentz, Gurpreet Mariani, MD   MDM Rules/Calculators/A&P                           Patient Vitals for the past 24 hrs:  BP Temp Temp src Pulse Resp SpO2  10/07/20 2330 122/78 -- -- (!) 104 16 98 %  10/07/20 2200 (!) 106/95 -- -- 92 20 99 %  10/07/20 2030 (!) 155/141 -- -- 92 15 100 %  10/07/20 2001 (!) 142/77 -- -- 87 14 100 %  10/07/20 2000 (!) 142/77 -- -- 87 12 100 %  10/07/20 1945 -- -- -- 88 15 100 %  10/07/20 1930 (!) 136/97 -- -- 90 12 100 %  10/07/20 1922 (!) 129/91 -- -- 93 18 100 %  10/07/20 1915 -- -- -- 81 13 93 %  10/07/20 1900 -- -- -- (!) 103 (!) 25 100 %  10/07/20 1850 (!) 153/76 -- -- 84 10 95 %  10/07/20 1845 -- -- -- 74 --  99 %  10/07/20 1801 135/66 -- -- 84 17 97 %  10/07/20 1800 -- -- -- 83 17 98 %  10/07/20 1745 -- -- -- 83 19 100 %  10/07/20  1730 (!) 140/110 -- -- 79 17 98 %  10/07/20 1715 -- -- -- 78 10 95 %  10/07/20 1710 -- 98.2 F (36.8 C) Oral 86 -- 99 %  10/07/20 1705 (!) 152/71 -- -- 84 16 97 %    9:57 PM Reevaluation with update and discussion. After initial assessment and treatment, an updated evaluation reveals he remains stable without additional complaints.  Findings discussed and questions answered. Mancel Bale   Medical Decision Making:  This patient is presenting for evaluation of mechanical fall with right hip fracture, which does require a range of treatment options, and is a complaint that involves a high risk of morbidity and mortality. The differential diagnoses include contusion, fracture, dislocation. I decided to review old records, and in summary elderly male, at home today when he tripped, causing a fall, whereupon he injured his right hip.  I do not require additional historical information from anyone.  Clinical Laboratory Tests Ordered, included CBC and Metabolic panel. Review indicates normal except hemoglobin low, glucose high, calcium low. Radiologic Tests Ordered, included pelvis and right hip, CT head and cervical spine.  I independently Visualized: Radiograph images, which show inotrope fracture, right hip, subdural hematoma with shift   Critical Interventions-clinical evaluation, laboratory testing, radiography, medication treatment, observation, discussion with hospitalist, discussion with neurosurgery, discussed with neurosurgery, discussed with intensivist to arrange admission.  Arranges made to transfer to [facility], emergency department to see the traumatologist.  After These Interventions, the Patient was reevaluated and was found with right hip fracture and intracranial bleeding, on Xarelto.  He requires transfer to [facility] to be admitted to the neurologic ICU by trauma service.  Patient remained stable in the ED, with supportive measures, including IV fluids, narcotic  analgesia, and Kcentra to reverse Xarelto.  Patient is critically ill and requires close management.  CRITICAL CARE-yes Performed by: Mancel Bale  Nursing Notes Reviewed/ Care Coordinated Applicable Imaging Reviewed Interpretation of Laboratory Data incorporated into ED treatment   Plan transfer for admission    Final Clinical Impression(s) / ED Diagnoses Final diagnoses:  Closed fracture of right hip, initial encounter (HCC)  Intracranial bleeding (HCC)  Current use of long term anticoagulation  Laceration of scalp, initial encounter    Rx / DC Orders ED Discharge Orders    None       Mancel Bale, MD 10/07/20 1610    Mancel Bale, MD 10/08/20 0028

## 2020-10-07 NOTE — ED Triage Notes (Signed)
Pt BIB EMS from home c/o mechanical fall with right hip injury. Pt did hit head on chair during the fall. Denies LOC. Pt on blood thinners. Deformity to right hip, also shortening and outward roationg of right leg. 18G LH. 100 mcg fentanyl given PTA.

## 2020-10-08 ENCOUNTER — Inpatient Hospital Stay (HOSPITAL_COMMUNITY): Payer: Medicare PPO

## 2020-10-08 ENCOUNTER — Inpatient Hospital Stay (HOSPITAL_COMMUNITY): Payer: Medicare PPO | Admitting: Certified Registered Nurse Anesthetist

## 2020-10-08 ENCOUNTER — Encounter (HOSPITAL_COMMUNITY): Payer: Self-pay

## 2020-10-08 ENCOUNTER — Other Ambulatory Visit: Payer: Self-pay

## 2020-10-08 ENCOUNTER — Encounter (HOSPITAL_COMMUNITY): Admission: EM | Disposition: A | Discharge status: Rehabilitation Facility | Payer: Self-pay | Source: Home / Self Care

## 2020-10-08 DIAGNOSIS — S065XAA Traumatic subdural hemorrhage with loss of consciousness status unknown, initial encounter: Secondary | ICD-10-CM | POA: Diagnosis present

## 2020-10-08 DIAGNOSIS — G8911 Acute pain due to trauma: Secondary | ICD-10-CM

## 2020-10-08 DIAGNOSIS — S72001A Fracture of unspecified part of neck of right femur, initial encounter for closed fracture: Secondary | ICD-10-CM

## 2020-10-08 HISTORY — PX: CRANIOTOMY: SHX93

## 2020-10-08 HISTORY — PX: INTRAMEDULLARY (IM) NAIL INTERTROCHANTERIC: SHX5875

## 2020-10-08 LAB — URINALYSIS, ROUTINE W REFLEX MICROSCOPIC
Bilirubin Urine: NEGATIVE
Glucose, UA: NEGATIVE mg/dL
Ketones, ur: 20 mg/dL — AB
Nitrite: NEGATIVE
Protein, ur: NEGATIVE mg/dL
RBC / HPF: >50 RBC/hpf — ABNORMAL HIGH (ref 0–5)
Specific Gravity, Urine: 1.021 (ref 1.005–1.030)
pH: 5 (ref 5.0–8.0)

## 2020-10-08 LAB — MRSA PCR SCREENING: MRSA by PCR: NEGATIVE

## 2020-10-08 SURGERY — FIXATION, FRACTURE, INTERTROCHANTERIC, WITH INTRAMEDULLARY ROD
Anesthesia: General | Site: Hip | Laterality: Right

## 2020-10-08 MED ORDER — OXYCODONE HCL 5 MG PO TABS
5.0000 mg | ORAL_TABLET | ORAL | Status: DC | PRN
Start: 2020-10-08 — End: 2020-10-13
  Administered 2020-10-09 – 2020-10-13 (×10): 5 mg via ORAL
  Filled 2020-10-08 (×10): qty 1

## 2020-10-08 MED ORDER — AMISULPRIDE (ANTIEMETIC) 5 MG/2ML IV SOLN: 10.0000 mg | Freq: Once | INTRAVENOUS | Status: DC | PRN

## 2020-10-08 MED ORDER — BACITRACIN ZINC 500 UNIT/GM EX OINT
TOPICAL_OINTMENT | CUTANEOUS | Status: AC
  Filled 2020-10-08: qty 28.35

## 2020-10-08 MED ORDER — ORAL CARE MOUTH RINSE: 15.0000 mL | Freq: Once | OROMUCOSAL | Status: AC

## 2020-10-08 MED ORDER — LACTATED RINGERS IV SOLN: INTRAVENOUS | Status: DC | PRN

## 2020-10-08 MED ORDER — SUGAMMADEX SODIUM 200 MG/2ML IV SOLN
INTRAVENOUS | Status: DC | PRN
  Administered 2020-10-08: 300 mg via INTRAVENOUS

## 2020-10-08 MED ORDER — POVIDONE-IODINE 10 % EX SWAB
2.0000 "application " | Freq: Once | CUTANEOUS | Status: AC
  Administered 2020-10-08: 2 via TOPICAL

## 2020-10-08 MED ORDER — METHOCARBAMOL 500 MG PO TABS
500.0000 mg | ORAL_TABLET | Freq: Three times a day (TID) | ORAL | Status: DC | PRN
  Administered 2020-10-08 – 2020-10-12 (×8): 500 mg via ORAL
  Filled 2020-10-08 (×8): qty 1

## 2020-10-08 MED ORDER — 0.9 % SODIUM CHLORIDE (POUR BTL) OPTIME
TOPICAL | Status: DC | PRN
  Administered 2020-10-08 (×3): 1000 mL

## 2020-10-08 MED ORDER — CHLORHEXIDINE GLUCONATE 4 % EX LIQD
60.0000 mL | Freq: Once | CUTANEOUS | Status: AC
  Administered 2020-10-08: 4 via TOPICAL
  Filled 2020-10-08: qty 60

## 2020-10-08 MED ORDER — ACETAMINOPHEN 10 MG/ML IV SOLN: 1000.0000 mg | Freq: Once | INTRAVENOUS | Status: DC | PRN

## 2020-10-08 MED ORDER — ROCURONIUM BROMIDE 10 MG/ML (PF) SYRINGE
PREFILLED_SYRINGE | INTRAVENOUS | Status: DC | PRN
  Administered 2020-10-08: 10 mg via INTRAVENOUS
  Administered 2020-10-08: 50 mg via INTRAVENOUS

## 2020-10-08 MED ORDER — PHENYLEPHRINE 40 MCG/ML (10ML) SYRINGE FOR IV PUSH (FOR BLOOD PRESSURE SUPPORT)
PREFILLED_SYRINGE | INTRAVENOUS | Status: DC | PRN
  Administered 2020-10-08: 80 ug via INTRAVENOUS

## 2020-10-08 MED ORDER — FENTANYL CITRATE (PF) 100 MCG/2ML IJ SOLN
25.0000 ug | INTRAMUSCULAR | Status: DC | PRN
  Administered 2020-10-08 (×2): 50 ug via INTRAVENOUS

## 2020-10-08 MED ORDER — SODIUM CHLORIDE 0.9 % IV SOLN: INTRAVENOUS | Status: DC | PRN

## 2020-10-08 MED ORDER — LIDOCAINE-EPINEPHRINE 1 %-1:100000 IJ SOLN
INTRAMUSCULAR | Status: DC | PRN
  Administered 2020-10-08: 3 mL

## 2020-10-08 MED ORDER — ROCURONIUM BROMIDE 10 MG/ML (PF) SYRINGE
PREFILLED_SYRINGE | INTRAVENOUS | Status: AC
  Filled 2020-10-08: qty 10

## 2020-10-08 MED ORDER — ONDANSETRON HCL 4 MG/2ML IJ SOLN: 4.0000 mg | Freq: Once | INTRAMUSCULAR | Status: DC | PRN

## 2020-10-08 MED ORDER — METHOCARBAMOL 1000 MG/10ML IJ SOLN
500.0000 mg | Freq: Once | INTRAVENOUS | Status: AC
  Administered 2020-10-08: 500 mg via INTRAVENOUS
  Filled 2020-10-08: qty 5

## 2020-10-08 MED ORDER — LEVETIRACETAM IN NACL 500 MG/100ML IV SOLN
500.0000 mg | Freq: Two times a day (BID) | INTRAVENOUS | Status: DC
  Administered 2020-10-08 – 2020-10-13 (×11): 500 mg via INTRAVENOUS
  Filled 2020-10-08 (×11): qty 100

## 2020-10-08 MED ORDER — LIDOCAINE 2% (20 MG/ML) 5 ML SYRINGE
INTRAMUSCULAR | Status: DC | PRN
  Administered 2020-10-08: 60 mg via INTRAVENOUS

## 2020-10-08 MED ORDER — PROPOFOL 10 MG/ML IV BOLUS
INTRAVENOUS | Status: DC | PRN
  Administered 2020-10-08: 50 mg via INTRAVENOUS

## 2020-10-08 MED ORDER — CEFAZOLIN SODIUM-DEXTROSE 2-4 GM/100ML-% IV SOLN
2.0000 g | INTRAVENOUS | Status: AC
  Administered 2020-10-08: 2 g via INTRAVENOUS
  Filled 2020-10-08 (×2): qty 100

## 2020-10-08 MED ORDER — CHLORHEXIDINE GLUCONATE 0.12 % MT SOLN
OROMUCOSAL | Status: AC
  Administered 2020-10-08: 15 mL
  Filled 2020-10-08: qty 15

## 2020-10-08 MED ORDER — THROMBIN 5000 UNITS EX SOLR: OROMUCOSAL | Status: DC | PRN

## 2020-10-08 MED ORDER — CHLORHEXIDINE GLUCONATE CLOTH 2 % EX PADS
6.0000 | MEDICATED_PAD | Freq: Every day | CUTANEOUS | Status: DC
  Administered 2020-10-08 – 2020-10-13 (×6): 6 via TOPICAL

## 2020-10-08 MED ORDER — LIDOCAINE HCL (PF) 2 % IJ SOLN
INTRAMUSCULAR | Status: AC
  Filled 2020-10-08: qty 5

## 2020-10-08 MED ORDER — ALBUMIN HUMAN 5 % IV SOLN: INTRAVENOUS | Status: DC | PRN

## 2020-10-08 MED ORDER — LABETALOL HCL 5 MG/ML IV SOLN
INTRAVENOUS | Status: DC | PRN
  Administered 2020-10-08: 10 mg via INTRAVENOUS

## 2020-10-08 MED ORDER — CHLORHEXIDINE GLUCONATE 0.12 % MT SOLN: 15.0000 mL | Freq: Once | OROMUCOSAL | Status: AC

## 2020-10-08 MED ORDER — HYDROMORPHONE HCL 1 MG/ML IJ SOLN
0.5000 mg | INTRAMUSCULAR | Status: DC | PRN
  Administered 2020-10-08 – 2020-10-09 (×7): 0.5 mg via INTRAVENOUS
  Filled 2020-10-08 (×6): qty 1

## 2020-10-08 MED ORDER — FENTANYL CITRATE (PF) 100 MCG/2ML IJ SOLN
INTRAMUSCULAR | Status: AC
  Filled 2020-10-08: qty 2

## 2020-10-08 MED ORDER — FENTANYL CITRATE (PF) 250 MCG/5ML IJ SOLN
INTRAMUSCULAR | Status: AC
  Filled 2020-10-08: qty 5

## 2020-10-08 MED ORDER — THROMBIN (RECOMBINANT) 5000 UNITS EX SOLR
CUTANEOUS | Status: AC
  Filled 2020-10-08: qty 5000

## 2020-10-08 MED ORDER — PROPOFOL 10 MG/ML IV BOLUS
INTRAVENOUS | Status: AC
  Filled 2020-10-08: qty 20

## 2020-10-08 MED ORDER — LIDOCAINE-EPINEPHRINE 1 %-1:100000 IJ SOLN
INTRAMUSCULAR | Status: AC
  Filled 2020-10-08: qty 1

## 2020-10-08 MED ORDER — FENTANYL CITRATE (PF) 250 MCG/5ML IJ SOLN
INTRAMUSCULAR | Status: DC | PRN
  Administered 2020-10-08: 150 ug via INTRAVENOUS

## 2020-10-08 MED ORDER — LACTATED RINGERS IV SOLN: INTRAVENOUS | Status: DC

## 2020-10-08 SURGICAL SUPPLY — 101 items
ADH SKN CLS APL DERMABOND .7 (GAUZE/BANDAGES/DRESSINGS) ×2
BIT DRILL INTERTAN LAG SCREW (BIT) ×2 IMPLANT
BIT DRILL SHORT 4.0 (BIT) IMPLANT
BLADE CLIPPER SURG (BLADE) ×4 IMPLANT
BNDG COHESIVE 4X5 TAN STRL (GAUZE/BANDAGES/DRESSINGS) ×4 IMPLANT
BNDG COHESIVE 6X5 TAN STRL LF (GAUZE/BANDAGES/DRESSINGS) ×4 IMPLANT
BNDG GAUZE ELAST 4 BULKY (GAUZE/BANDAGES/DRESSINGS) ×4 IMPLANT
BUR ACORN 9.0 PRECISION (BURR) ×3 IMPLANT
BUR ACORN 9.0MM PRECISION (BURR) ×1
BUR MATCHSTICK NEURO 3.0 LAGG (BURR) IMPLANT
BUR SPIRAL ROUTER 2.3 (BUR) ×2 IMPLANT
BUR SPIRAL ROUTER 2.3MM (BUR)
CANISTER SUCT 3000ML PPV (MISCELLANEOUS) ×4 IMPLANT
CATH VENTRIC 35X38 W/TROCAR LG (CATHETERS) ×2 IMPLANT
CLIP VESOCCLUDE MED 6/CT (CLIP) IMPLANT
COVER PERINEAL POST (MISCELLANEOUS) ×4 IMPLANT
COVER SURGICAL LIGHT HANDLE (MISCELLANEOUS) ×6 IMPLANT
COVER WAND RF STERILE (DRAPES) ×6 IMPLANT
DERMABOND ADVANCED (GAUZE/BANDAGES/DRESSINGS) ×2
DERMABOND ADVANCED .7 DNX12 (GAUZE/BANDAGES/DRESSINGS) IMPLANT
DRAPE NEUROLOGICAL W/INCISE (DRAPES) ×4 IMPLANT
DRAPE SHEET LG 3/4 BI-LAMINATE (DRAPES) ×4 IMPLANT
DRAPE STERI IOBAN 125X83 (DRAPES) ×4 IMPLANT
DRAPE SURG 17X23 STRL (DRAPES) IMPLANT
DRAPE WARM FLUID 44X44 (DRAPES) ×4 IMPLANT
DRESSING MEPILEX FLEX 4X4 (GAUZE/BANDAGES/DRESSINGS) IMPLANT
DRESSING PEEL AND PLC PRVNA 13 (GAUZE/BANDAGES/DRESSINGS) IMPLANT
DRILL BIT SHORT 4.0 (BIT)
DRSG MEPILEX BORDER 4X4 (GAUZE/BANDAGES/DRESSINGS) ×6 IMPLANT
DRSG MEPILEX FLEX 4X4 (GAUZE/BANDAGES/DRESSINGS) ×8
DRSG PEEL AND PLACE PREVENA 13 (GAUZE/BANDAGES/DRESSINGS) ×4
DURAPREP 26ML APPLICATOR (WOUND CARE) ×4 IMPLANT
DURAPREP 6ML APPLICATOR 50/CS (WOUND CARE) ×4 IMPLANT
ELECT REM PT RETURN 9FT ADLT (ELECTROSURGICAL) ×8
ELECTRODE REM PT RTRN 9FT ADLT (ELECTROSURGICAL) ×4 IMPLANT
EVACUATOR 1/8 PVC DRAIN (DRAIN) IMPLANT
EVACUATOR SILICONE 100CC (DRAIN) IMPLANT
GAUZE 4X4 16PLY RFD (DISPOSABLE) IMPLANT
GAUZE SPONGE 4X4 12PLY STRL (GAUZE/BANDAGES/DRESSINGS) ×4 IMPLANT
GLOVE BIOGEL PI IND STRL 7.5 (GLOVE) ×4 IMPLANT
GLOVE BIOGEL PI INDICATOR 7.5 (GLOVE) ×4
GLOVE ECLIPSE 7.0 STRL STRAW (GLOVE) ×4 IMPLANT
GLOVE ECLIPSE 7.5 STRL STRAW (GLOVE) ×8 IMPLANT
GLOVE EXAM NITRILE LRG STRL (GLOVE) IMPLANT
GLOVE EXAM NITRILE XL STR (GLOVE) IMPLANT
GLOVE EXAM NITRILE XS STR PU (GLOVE) IMPLANT
GLOVE SKINSENSE NS SZ7.5 (GLOVE) ×4
GLOVE SKINSENSE STRL SZ7.5 (GLOVE) ×4 IMPLANT
GLOVE SURG UNDER POLY LF SZ7 (GLOVE) ×4 IMPLANT
GOWN STRL REIN XL XLG (GOWN DISPOSABLE) ×6 IMPLANT
GOWN STRL REUS W/ TWL LRG LVL3 (GOWN DISPOSABLE) ×4 IMPLANT
GOWN STRL REUS W/ TWL XL LVL3 (GOWN DISPOSABLE) IMPLANT
GOWN STRL REUS W/TWL 2XL LVL3 (GOWN DISPOSABLE) IMPLANT
GOWN STRL REUS W/TWL LRG LVL3 (GOWN DISPOSABLE) ×8
GOWN STRL REUS W/TWL XL LVL3 (GOWN DISPOSABLE)
GUIDE PIN 3.2X343 (PIN) ×4
GUIDE PIN 3.2X343MM (PIN) ×8
HEMOSTAT POWDER KIT SURGIFOAM (HEMOSTASIS) ×4 IMPLANT
HEMOSTAT SURGICEL 2X14 (HEMOSTASIS) IMPLANT
KIT BASIN OR (CUSTOM PROCEDURE TRAY) ×8 IMPLANT
KIT DRAIN CSF ACCUDRAIN (MISCELLANEOUS) ×2 IMPLANT
KIT DRSG PREVENA PLUS 7DAY 125 (MISCELLANEOUS) ×2 IMPLANT
KIT TURNOVER KIT B (KITS) ×8 IMPLANT
MANIFOLD NEPTUNE II (INSTRUMENTS) ×4 IMPLANT
NAIL TRIGEN INTERT 11.5X40-125 (Nail) ×2 IMPLANT
NEEDLE HYPO 22GX1.5 SAFETY (NEEDLE) ×4 IMPLANT
NS IRRIG 1000ML POUR BTL (IV SOLUTION) ×10 IMPLANT
PACK CRANIOTOMY CUSTOM (CUSTOM PROCEDURE TRAY) ×4 IMPLANT
PACK GENERAL/GYN (CUSTOM PROCEDURE TRAY) ×4 IMPLANT
PAD ARMBOARD 7.5X6 YLW CONV (MISCELLANEOUS) ×8 IMPLANT
PATTIES SURGICAL .5 X.5 (GAUZE/BANDAGES/DRESSINGS) IMPLANT
PATTIES SURGICAL .5 X3 (DISPOSABLE) IMPLANT
PATTIES SURGICAL 1X1 (DISPOSABLE) IMPLANT
PIN GUIDE 3.2X343MM (PIN) IMPLANT
SCREW LAG COMPR KIT 110/105 (Screw) ×2 IMPLANT
SPONGE NEURO XRAY DETECT 1X3 (DISPOSABLE) IMPLANT
SPONGE SURGIFOAM ABS GEL 100 (HEMOSTASIS) ×2 IMPLANT
STAPLER VISISTAT 35W (STAPLE) ×8 IMPLANT
STOCKINETTE 6  STRL (DRAPES) ×4
STOCKINETTE 6 STRL (DRAPES) ×2 IMPLANT
SUT ETHILON 2 0 FS 18 (SUTURE) ×6 IMPLANT
SUT ETHILON 3 0 FSL (SUTURE) IMPLANT
SUT ETHILON 3 0 PS 1 (SUTURE) IMPLANT
SUT MON AB 3-0 SH 27 (SUTURE)
SUT MON AB 3-0 SH27 (SUTURE) IMPLANT
SUT NURALON 4 0 TR CR/8 (SUTURE) ×8 IMPLANT
SUT VIC AB 0 CT1 18XCR BRD8 (SUTURE) ×4 IMPLANT
SUT VIC AB 0 CT1 27 (SUTURE) ×4
SUT VIC AB 0 CT1 27XBRD ANBCTR (SUTURE) ×2 IMPLANT
SUT VIC AB 0 CT1 8-18 (SUTURE) ×4
SUT VIC AB 2-0 CP2 18 (SUTURE) ×2 IMPLANT
SUT VIC AB 2-0 CT1 27 (SUTURE) ×8
SUT VIC AB 2-0 CT1 TAPERPNT 27 (SUTURE) ×2 IMPLANT
SUT VIC AB 3-0 SH 8-18 (SUTURE) ×6 IMPLANT
TOWEL GREEN STERILE (TOWEL DISPOSABLE) ×8 IMPLANT
TOWEL GREEN STERILE FF (TOWEL DISPOSABLE) ×8 IMPLANT
TRAY FOLEY MTR SLVR 16FR STAT (SET/KITS/TRAYS/PACK) ×2 IMPLANT
TUBE CONNECTING 12'X1/4 (SUCTIONS)
TUBE CONNECTING 12X1/4 (SUCTIONS) ×2 IMPLANT
UNDERPAD 30X36 HEAVY ABSORB (UNDERPADS AND DIAPERS) ×2 IMPLANT
WATER STERILE IRR 1000ML POUR (IV SOLUTION) ×8 IMPLANT

## 2020-10-08 NOTE — Transfer of Care (Signed)
Immediate Anesthesia Transfer of Care Note  Patient: Nathan Mitchell  Procedure(s) Performed: RIGHT INTRAMEDULLARY (IM) NAIL INTERTROCHANTRIC WITH PREVENA WOUND VAC APPLICATION (Right: Hip) LEFT BURR HOLE DRAINAGE OF SUBDURAL HEMATOMA (Left: Head)  Patient Location: PACU  Anesthesia Type:General  Level of Consciousness: awake and alert   Airway & Oxygen Therapy: Patient Spontanous Breathing and Patient connected to nasal cannula oxygen  Post-op Assessment: Report given to RN and Post -op Vital signs reviewed and stable  Post vital signs: Reviewed and stable  Last Vitals:  Vitals Value Taken Time  BP 149/94 10/08/20 2010  Temp    Pulse 63 10/08/20 2012  Resp 25 10/08/20 2012  SpO2 74 % 10/08/20 2012  Vitals shown include unvalidated device data.  Last Pain:  Vitals:   10/08/20 1602  TempSrc: Oral  PainSc:       Patients Stated Pain Goal: 3 (10/08/20 0800)  Complications: No notable events documented.

## 2020-10-08 NOTE — Op Note (Signed)
PATIENT: Nathan Mitchell  DAY OF SURGERY: 10/08/20   PRE-OPERATIVE DIAGNOSIS:  Left subdural hematoma   POST-OPERATIVE DIAGNOSIS:  Left subdural hematoma   PROCEDURE:  Left burr hole drainage of subdural hematoma   SURGEON:  Surgeon(s) and Role:    Jadene Pierini, MD - Primary   ANESTHESIA: ETGA   BRIEF HISTORY: This is an 87 year old man who presented with after a fall with a right hip fracture and left subdural hematoma with altered mental status and left upper extremity weakness. Lower extremity exam was obviously limited by the patient's fracture. I therefore recommended drainage of the subdural hematoma. This was discussed with the patient and his wife as well as risks, benefits, and alternatives and they wished to proceed with surgery.   OPERATIVE DETAIL: The patient was taken to the operating room and placed on the OR table in the supine position. A formal time out was performed with two patient identifiers and confirmed the operative site. Anesthesia had already been induced by the anesthesia team for the patient's fracture repair. The operative site had been marked in preop, hair was clipped with surgical clippers, the area was then prepped and draped in a sterile fashion.   A linear incision was placed on the left side of the head. A retractor was placed and a burr hole was made with a high speed drill. The dura was opened and coagulated with return of dark blood under moderate pressure. This was then copiously irrigated until clear irrigation was obtained. A ventricular catheter was placed into the subdural space, tunneled, and secured with suture then connected to a closed drainage system. The wound was copiously irrigated, hemostasis was confirmed, all instrument and sponge counts were correct, the incision was then closed in layers. The patient was then returned to anesthesia for emergence. No apparent complications at the completion of the procedure.   EBL:  63mL    DRAINS: Ventricular catheter in the subdural space   SPECIMENS: none   Jadene Pierini, MD 10/08/20 6:01 PM

## 2020-10-08 NOTE — TOC CAGE-AID Note (Signed)
Transition of Care [facility] - CAGE-AID Screening   Patient Details  Name: Nathan Mitchell MRN: [id number] Date of Birth: 12-24-1930  Transition of [facility]) CM/SW Contact:    Glennon Mac, RN Phone Number: 10/08/2020, 3:15 PM   Clinical Narrative: Pt s/p fall with hip fx and TBI.  Pt disortiented, unable to participate in SA assessment.    CAGE-AID Screening: Substance Abuse Screening unable to be completed due to: : Patient unable to participate (Patient with TBI; disoriented)              Quintella Baton, RN, BSN  Trauma/Neuro ICU Case Manager [phone]

## 2020-10-08 NOTE — Anesthesia Preprocedure Evaluation (Signed)
Anesthesia Evaluation  Patient identified by MRN, date of birth, ID band Patient confused    Reviewed: Allergy & Precautions, NPO status , Patient's Chart, lab work & pertinent test results  History of Anesthesia Complications Negative for: history of anesthetic complications  Airway Mallampati: II  TM Distance: >3 FB Neck ROM: Full    Dental no notable dental hx. (+) Dental Advisory Given   Pulmonary COPD, former smoker,    Pulmonary exam normal        Cardiovascular negative cardio ROS Normal cardiovascular exam     Neuro/Psych PSYCHIATRIC DISORDERS Depression negative neurological ROS     GI/Hepatic Neg liver ROS, GERD  ,  Endo/Other  negative endocrine ROS  Renal/GU negative Renal ROS     Musculoskeletal negative musculoskeletal ROS (+)   Abdominal   Peds  Hematology negative hematology ROS (+)   Anesthesia Other Findings   Reproductive/Obstetrics                             Anesthesia Physical Anesthesia Plan  ASA: 4  Anesthesia Plan: General   Post-op Pain Management:    Induction: Intravenous  PONV Risk Score and Plan: 2 and Ondansetron, Treatment may vary due to age or medical condition and Dexamethasone  Airway Management Planned: Oral ETT  Additional Equipment:   Intra-op Plan:   Post-operative Plan: Possible Post-op intubation/ventilation  Informed Consent: I have reviewed the patients History and Physical, chart, labs and discussed the procedure including the risks, benefits and alternatives for the proposed anesthesia with the patient or authorized representative who has indicated his/her understanding and acceptance.     Dental advisory given and Consent reviewed with POA  Plan Discussed with: Anesthesiologist, CRNA and Surgeon  Anesthesia Plan Comments:         Anesthesia Quick Evaluation

## 2020-10-08 NOTE — Consult Note (Signed)
Reason for Consult:Right hip fx Referring Physician: Sophronia Simas Time called: 0730 Time at bedside: 0857   Nathan Mitchell is an 87 y.o. male.  HPI: Nathan Mitchell was getting up from a table and somehow tripped and fell. His wife thinks he may have tripped over his indwelling catheter that he's only had for 6 weeks or so. He had immediate right hip pain and could not get up. He was brought to the ED where x-rays showed a right hip fx and orthopedic surgery was consulted. He also had a SDH on Xarelto. He was reversed and NS recommended primary treatment of the hip fx.  Past Medical History:  Diagnosis Date   Aortic atherosclerosis (HCC)    Atrial fibrillation (HCC)    Giant cell arteritis (HCC)    Osteopenia    Pure hypercholesterolemia    Thoracic aortic aneurysm (HCC)    Thrombophilia (HCC)     Past Surgical History:  Procedure Laterality Date   APPENDECTOMY     HERNIA REPAIR      No family history on file.  Social History:  reports that he has quit smoking. He has never used smokeless tobacco. He reports current alcohol use of about 3.0 standard drinks of alcohol per week. He reports that he does not use drugs.  Allergies:  Allergies  Allergen Reactions   Tiotropium Bromide Monohydrate     Other reaction(s): ineffecticve   Umeclidinium-Vilanterol     Other reaction(s): ineffective    Medications: I have reviewed the patient's current medications.  Results for orders placed or performed during the hospital encounter of 10/07/20 (from the past 48 hour(s))  CBC WITH DIFFERENTIAL     Status: Abnormal   Collection Time: 10/07/20  5:55 PM  Result Value Ref Range   WBC 9.6 4.0 - 10.5 K/uL   RBC 3.27 (L) 4.22 - 5.81 MIL/uL   Hemoglobin 9.7 (L) 13.0 - 17.0 g/dL   HCT 45.6 (L) 25.6 - 38.9 %   MCV 96.6 80.0 - 100.0 fL   MCH 29.7 26.0 - 34.0 pg   MCHC 30.7 30.0 - 36.0 g/dL   RDW 37.3 (H) 42.8 - 76.8 %   Platelets 232 150 - 400 K/uL   nRBC 0.0 0.0 - 0.2 %   Neutrophils  Relative % 87 %   Neutro Abs 8.2 (H) 1.7 - 7.7 K/uL   Lymphocytes Relative 7 %   Lymphs Abs 0.7 0.7 - 4.0 K/uL   Monocytes Relative 5 %   Monocytes Absolute 0.5 0.1 - 1.0 K/uL   Eosinophils Relative 1 %   Eosinophils Absolute 0.1 0.0 - 0.5 K/uL   Basophils Relative 0 %   Basophils Absolute 0.0 0.0 - 0.1 K/uL   Immature Granulocytes 0 %   Abs Immature Granulocytes 0.04 0.00 - 0.07 K/uL    Comment: Performed at [facility], [address] [place], [place] [address]  Type and screen [facility]     Status: None   Collection Time: 10/07/20  5:55 PM  Result Value Ref Range   ABO/RH(D) A NEG    Antibody Screen NEG    Sample Expiration      10/10/2020,2359 Performed at [facility], [address] [place], [place] [address]   Basic metabolic panel     Status: Abnormal   Collection Time: 10/07/20  5:55 PM  Result Value Ref Range   Sodium 139 135 - 145 mmol/L   Potassium 4.5 3.5 - 5.1  mmol/L   Chloride 108 98 - 111 mmol/L   CO2 24 22 - 32 mmol/L   Glucose, Bld 113 (H) 70 - 99 mg/dL    Comment: Glucose reference range applies only to samples taken after fasting for at least 8 hours.   BUN 23 8 - 23 mg/dL   Creatinine, Ser 3.55 0.61 - 1.24 mg/dL   Calcium 8.5 (L) 8.9 - 10.3 mg/dL   GFR, Estimated >97 >41 mL/min    Comment: (NOTE) Calculated using the CKD-EPI Creatinine Equation (2021)    Anion gap 7 5 - 15    Comment: Performed at [facility], [address] [place], [place] [address]  Resp Panel by RT-PCR (Flu A&B, Covid) Nasopharyngeal Swab     Status: None   Collection Time: 10/07/20  6:50 PM   Specimen: Nasopharyngeal Swab; Nasopharyngeal(NP) swabs in vial transport medium  Result Value Ref Range   SARS Coronavirus 2 by RT PCR NEGATIVE NEGATIVE    Comment: (NOTE) SARS-CoV-2 target nucleic acids are NOT DETECTED.  The SARS-CoV-2 RNA is generally detectable in upper respiratory specimens during  the acute phase of infection. The lowest concentration of SARS-CoV-2 viral copies this assay can detect is 138 copies/mL. A negative result does not preclude SARS-Cov-2 infection and should not be used as the sole basis for treatment or other patient management decisions. A negative result may occur with  improper specimen collection/handling, submission of specimen other than nasopharyngeal swab, presence of viral mutation(s) within the areas targeted by this assay, and inadequate number of viral copies(<138 copies/mL). A negative result must be combined with clinical observations, patient history, and epidemiological information. The expected result is Negative.  Fact Sheet for Patients:  BloggerCourse.com  Fact Sheet for Healthcare Providers:  SeriousBroker.it  This test is no t yet approved or cleared by the [place] FDA and  has been authorized for detection and/or diagnosis of SARS-CoV-2 by FDA under an Emergency Use Authorization (EUA). This EUA will remain  in effect (meaning this test can be used) for the duration of the COVID-19 declaration under Section 564(b)(1) of the Act, 21 U.S.C.section 360bbb-3(b)(1), unless the authorization is terminated  or revoked sooner.       Influenza A by PCR NEGATIVE NEGATIVE   Influenza B by PCR NEGATIVE NEGATIVE    Comment: (NOTE) The Xpert Xpress SARS-CoV-2/FLU/RSV plus assay is intended as an aid in the diagnosis of influenza from Nasopharyngeal swab specimens and should not be used as a sole basis for treatment. Nasal washings and aspirates are unacceptable for Xpert Xpress SARS-CoV-2/FLU/RSV testing.  Fact Sheet for Patients: BloggerCourse.com  Fact Sheet for Healthcare Providers: SeriousBroker.it  This test is not yet approved or cleared by the [place] FDA and has been authorized for detection and/or diagnosis of  SARS-CoV-2 by FDA under an Emergency Use Authorization (EUA). This EUA will remain in effect (meaning this test can be used) for the duration of the COVID-19 declaration under Section 564(b)(1) of the Act, 21 U.S.C. section 360bbb-3(b)(1), unless the authorization is terminated or revoked.  Performed at [facility], [address] [place], [place] [address]   Urinalysis, Routine w reflex microscopic Urine, Clean Catch     Status: Abnormal   Collection Time: 10/08/20 12:13 AM  Result Value Ref Range   Color, Urine YELLOW YELLOW   APPearance HAZY (A) CLEAR   Specific Gravity, Urine 1.021 1.005 - 1.030   pH 5.0 5.0 - 8.0   Glucose, UA NEGATIVE  NEGATIVE mg/dL   Hgb urine dipstick LARGE (A) NEGATIVE   Bilirubin Urine NEGATIVE NEGATIVE   Ketones, ur 20 (A) NEGATIVE mg/dL   Protein, ur NEGATIVE NEGATIVE mg/dL   Nitrite NEGATIVE NEGATIVE   Leukocytes,Ua LARGE (A) NEGATIVE   RBC / HPF >50 (H) 0 - 5 RBC/hpf   WBC, UA 21-50 0 - 5 WBC/hpf   Bacteria, UA RARE (A) NONE SEEN   Mucus PRESENT    Hyaline Casts, UA PRESENT     Comment: Performed at [facility], [address] [place], [place] [address]  MRSA PCR Screening     Status: None   Collection Time: 10/08/20  4:13 AM   Specimen: Nasal Mucosa; Nasopharyngeal  Result Value Ref Range   MRSA by PCR NEGATIVE NEGATIVE    Comment:        The GeneXpert MRSA Assay (FDA approved for NASAL specimens only), is one component of a comprehensive MRSA colonization surveillance program. It is not intended to diagnose MRSA infection nor to guide or monitor treatment for MRSA infections. Performed at [facility], [address] [place], [place] [address]   Type and screen [facility]     Status: None   Collection Time: 10/08/20  4:47 AM  Result Value Ref Range   ABO/RH(D) A NEG    Antibody Screen NEG    Sample Expiration      10/11/2020,2359 Performed at [facility], [address] [place], [place] [address]     DG Chest 1 View  Result Date: 10/07/2020 CLINICAL DATA:  Right hip fracture. EXAM: CHEST  1 VIEW COMPARISON:  July 28, 2015. FINDINGS: The heart size and mediastinal contours are within normal limits. Both lungs are clear. Moderate size hiatal hernia is noted. The visualized skeletal structures are unremarkable. IMPRESSION: No acute cardiopulmonary abnormality seen. Moderate size hiatal hernia. Aortic Atherosclerosis (ICD10-I70.0). Electronically Signed   By: Lupita RaiderJames  Green Jr M.D.   On: 10/07/2020 18:52   CT HEAD WO CONTRAST  Result Date: 10/08/2020 CLINICAL DATA:  Subdural hematoma EXAM: CT HEAD WITHOUT CONTRAST TECHNIQUE: Contiguous axial images were obtained from the base of the skull through the vertex without intravenous contrast. COMPARISON:  October 07, 2020 FINDINGS: Brain: The mixed attenuation left-sided subdural hematoma overlying the left convexity with involvement of portions of the frontal, temporal, and parietal lobes is again noted. This subdural hematoma has maximum thickness in the posterior left parietal region of 2.8 cm, stable. There is 8 mm of midline shift toward the right, stable by remeasurement. No new extra-axial fluid collection. There is relative effacement of the left lateral ventricle, stable. There is midline shift the level of the third ventricle, stable. Fourth ventricle is midline. There is underlying age related volume loss. There is no intra-axial mass or intra-axial hemorrhage. There is mild small vessel disease in the centra semiovale bilaterally. There is evidence of a prior small infarct in the left thalamus. No acute infarct is demonstrated on this study. Vascular: No hyperdense vessel. There is calcification in each carotid siphon region. Skull: Bony calvarium appears intact. Sinuses/Orbits: There is mucosal thickening in several ethmoid air cells. There is ill-defined somewhat bubbly appearing opacity in the right frontal  sinus, stable. Orbits appear symmetric bilaterally. Other: Mastoid air cells clear. IMPRESSION: Mixed attenuation left-sided convexity subdural hematoma measuring up to 2.8 cm in thickness, most pronounced in the left posterior parietal region. No new extra-axial fluid. 8 mm of midline shift toward the  right, essentially stable, with relative effacement of the left lateral ventricle. Fourth ventricle remains midline. No intra-axial mass or hemorrhage. There is underlying age related volume loss with mild periventricular small vessel disease. Prior small infarct left thalamus. No acute infarct is demonstrable. There are foci of arterial vascular calcification. There are foci of paranasal sinus disease. Electronically Signed   By: Bretta Bang Mitchell M.D.   On: 10/08/2020 08:07   CT Head Wo Contrast  Result Date: 10/07/2020 CLINICAL DATA:  Fall, head injury, cervical spine injury, chronic anticoagulation EXAM: CT HEAD WITHOUT CONTRAST CT CERVICAL SPINE WITHOUT CONTRAST TECHNIQUE: Multidetector CT imaging of the head and cervical spine was performed following the standard protocol without intravenous contrast. Multiplanar CT image reconstructions of the cervical spine were also generated. COMPARISON:  None. FINDINGS: CT HEAD FINDINGS Brain: A large left subdural hematoma overlies the left cerebral convexity with mixed attenuation in keeping with hemorrhage of varying attenuation. This measures up to 26 mm in thickness on coronal image # 48/5. There is significant mass effect upon the left cerebral hemisphere. There is resultant effacement of the overlying sulci and 7 mm left-to-right midline shift. The left lateral ventricle is mildly effaced. The right lateral ventricle is of normal size there is no evidence of entrapment. The suprasellar cistern is preserved. There is moderate parenchymal volume loss commensurate with the patient's age. Remote lacunar infarct within the left thalamus. Mild periventricular white  matter changes are present likely reflecting the sequela of small vessel ischemia. Ventricular size is normal. Cerebellum is unremarkable. Vascular: No hyperdense vessel or unexpected calcification. Skull: Intact Sinuses/Orbits: Moderate bubbly secretions within the right frontal sinus. No air-fluid levels. Remaining paranasal sinuses are clear. The orbits are unremarkable. Other: Mastoid air cells and middle ear cavities are clear. Mild frontal scalp soft tissue swelling noted at the vertex. CT CERVICAL SPINE FINDINGS Alignment: Normal cervical lordosis. 1-2 mm anterolisthesis of C5 upon C6 is likely degenerative in nature. Skull base and vertebrae: The craniocervical alignment is normal. Advanced degenerative changes are seen at the atlantodental articulation. No superimposed widening. No acute fracture of the cervical spine. Vertebral body height has been preserved. Soft tissues and spinal canal: No prevertebral fluid or swelling. No visible canal hematoma. Disc levels: There is degenerative calcification of the intervertebral discs throughout the cervical spine in keeping with changes of mild degenerative disc disease. Intervertebral disc height has been preserved. The prevertebral soft tissues are not thickened on sagittal reformats. Review of the axial images demonstrates multilevel uncovertebral and facet arthrosis resulting in multilevel moderate to severe neuroforaminal narrowing, most severe bilaterally at C3-4 and C4-5. Upper chest: Patchy ground-glass infiltrate within the lung apices is not fully assessed on this examination, but was better assessed on prior examination of 12/25/2019. Other: Extensive atherosclerotic calcification is noted within the carotid bifurcations bilaterally. IMPRESSION: Large left subdural hematoma overlying the left cerebral convexity demonstrating significant mass effect upon the left cerebral hemisphere with effacement of the left lateral ventricle, effacement of the  overlying sulci, and resultant 7 mm left-to-right midline shift. Heterogeneous attenuation suggests hemorrhage at multiple points in time, as can be seen with chronic anticoagulation. Mild senescent change. Remote lacunar infarct within the left thalamus. No acute fracture or listhesis of the cervical spine. Multilevel degenerative disc and degenerative joint disease resulting in moderate to severe neuroforaminal narrowing at C3-4 and C4-5. Extensive atherosclerotic calcification within the carotid bifurcations bilaterally. The degree of stenosis would be better assessed with dedicated sonography if indicated. Patchy ground-glass pulmonary  infiltrate within the lung apices bilaterally, not fully assessed on this exam. This was better assessed on CT examination of 12/25/2019. These results were called by telephone at the time of interpretation on 10/07/2020 at 9:43 pm to provider [facility] , who verbally acknowledged these results. Electronically Signed   By: Helyn Numbers MD   On: 10/07/2020 21:43   CT Cervical Spine Wo Contrast  Result Date: 10/07/2020 CLINICAL DATA:  Fall, head injury, cervical spine injury, chronic anticoagulation EXAM: CT HEAD WITHOUT CONTRAST CT CERVICAL SPINE WITHOUT CONTRAST TECHNIQUE: Multidetector CT imaging of the head and cervical spine was performed following the standard protocol without intravenous contrast. Multiplanar CT image reconstructions of the cervical spine were also generated. COMPARISON:  None. FINDINGS: CT HEAD FINDINGS Brain: A large left subdural hematoma overlies the left cerebral convexity with mixed attenuation in keeping with hemorrhage of varying attenuation. This measures up to 26 mm in thickness on coronal image # 48/5. There is significant mass effect upon the left cerebral hemisphere. There is resultant effacement of the overlying sulci and 7 mm left-to-right midline shift. The left lateral ventricle is mildly effaced. The right lateral ventricle is of  normal size there is no evidence of entrapment. The suprasellar cistern is preserved. There is moderate parenchymal volume loss commensurate with the patient's age. Remote lacunar infarct within the left thalamus. Mild periventricular white matter changes are present likely reflecting the sequela of small vessel ischemia. Ventricular size is normal. Cerebellum is unremarkable. Vascular: No hyperdense vessel or unexpected calcification. Skull: Intact Sinuses/Orbits: Moderate bubbly secretions within the right frontal sinus. No air-fluid levels. Remaining paranasal sinuses are clear. The orbits are unremarkable. Other: Mastoid air cells and middle ear cavities are clear. Mild frontal scalp soft tissue swelling noted at the vertex. CT CERVICAL SPINE FINDINGS Alignment: Normal cervical lordosis. 1-2 mm anterolisthesis of C5 upon C6 is likely degenerative in nature. Skull base and vertebrae: The craniocervical alignment is normal. Advanced degenerative changes are seen at the atlantodental articulation. No superimposed widening. No acute fracture of the cervical spine. Vertebral body height has been preserved. Soft tissues and spinal canal: No prevertebral fluid or swelling. No visible canal hematoma. Disc levels: There is degenerative calcification of the intervertebral discs throughout the cervical spine in keeping with changes of mild degenerative disc disease. Intervertebral disc height has been preserved. The prevertebral soft tissues are not thickened on sagittal reformats. Review of the axial images demonstrates multilevel uncovertebral and facet arthrosis resulting in multilevel moderate to severe neuroforaminal narrowing, most severe bilaterally at C3-4 and C4-5. Upper chest: Patchy ground-glass infiltrate within the lung apices is not fully assessed on this examination, but was better assessed on prior examination of 12/25/2019. Other: Extensive atherosclerotic calcification is noted within the carotid  bifurcations bilaterally. IMPRESSION: Large left subdural hematoma overlying the left cerebral convexity demonstrating significant mass effect upon the left cerebral hemisphere with effacement of the left lateral ventricle, effacement of the overlying sulci, and resultant 7 mm left-to-right midline shift. Heterogeneous attenuation suggests hemorrhage at multiple points in time, as can be seen with chronic anticoagulation. Mild senescent change. Remote lacunar infarct within the left thalamus. No acute fracture or listhesis of the cervical spine. Multilevel degenerative disc and degenerative joint disease resulting in moderate to severe neuroforaminal narrowing at C3-4 and C4-5. Extensive atherosclerotic calcification within the carotid bifurcations bilaterally. The degree of stenosis would be better assessed with dedicated sonography if indicated. Patchy ground-glass pulmonary infiltrate within the lung apices bilaterally, not fully assessed on  this exam. This was better assessed on CT examination of 12/25/2019. These results were called by telephone at the time of interpretation on 10/07/2020 at 9:43 pm to provider [facility] , who verbally acknowledged these results. Electronically Signed   By: Helyn Numbers MD   On: 10/07/2020 21:43   DG HIP UNILAT WITH PELVIS 2-3 VIEWS RIGHT  Result Date: 10/07/2020 CLINICAL DATA:  Right hip deformity after fall. EXAM: DG HIP (WITH OR WITHOUT PELVIS) 2-3V RIGHT COMPARISON:  None. FINDINGS: Severely comminuted and displaced fracture is seen involving the intertrochanteric region of proximal right femur. IMPRESSION: Severely comminuted and displaced intertrochanteric fracture of proximal right femur. Electronically Signed   By: Lupita Raider M.D.   On: 10/07/2020 18:51    Review of Systems  HENT:  Negative for ear discharge, ear pain, hearing loss and tinnitus.   Eyes:  Negative for photophobia and pain.  Respiratory:  Negative for cough and shortness of breath.    Cardiovascular:  Negative for chest pain.  Gastrointestinal:  Negative for abdominal pain, nausea and vomiting.  Genitourinary:  Negative for dysuria, flank pain, frequency and urgency.  Musculoskeletal:  Positive for arthralgias (Right hip). Negative for back pain, myalgias and neck pain.  Neurological:  Negative for dizziness and headaches.  Hematological:  Does not bruise/bleed easily.  Psychiatric/Behavioral:  The patient is not nervous/anxious.   Blood pressure 109/76, pulse 100, temperature 99.5 F (37.5 C), temperature source Oral, resp. rate 15, height  (1.854 m), weight 78.1 kg, SpO2 99 %. Physical Exam Constitutional:      General: He is not in acute distress.    Appearance: He is well-developed. He is not diaphoretic.  HENT:     Head: Normocephalic and atraumatic.  Eyes:     General: No scleral icterus.       Right eye: No discharge.        Left eye: No discharge.     Conjunctiva/sclera: Conjunctivae normal.  Cardiovascular:     Rate and Rhythm: Normal rate and regular rhythm.  Pulmonary:     Effort: Pulmonary effort is normal. No respiratory distress.  Musculoskeletal:     Cervical back: Normal range of motion.  Skin:    General: Skin is warm and dry.  Neurological:     Mental Status: He is alert.  Psychiatric:        Behavior: Behavior normal.    Assessment/Plan: Right hip fx -- Plan IMN today by Dr. Roda Shutters. Please keep NPO. SDH -- per NS, plan for burr hole tomorrow Multiple medical problems including afib on Xarelto, GCA w/blindness, GERD, depression, and urinary retention -- per trauma service    Freeman Caldron, PA-C Orthopedic Surgery [phone]  10/08/2020, 9:16 AM

## 2020-10-08 NOTE — Anesthesia Postprocedure Evaluation (Signed)
Anesthesia Post Note  Patient: Nathan Mitchell  Procedure(s) Performed: RIGHT INTRAMEDULLARY (IM) NAIL INTERTROCHANTRIC WITH PREVENA WOUND VAC APPLICATION (Right: Hip) LEFT BURR HOLE DRAINAGE OF SUBDURAL HEMATOMA (Left: Head)     Patient location during evaluation: PACU Anesthesia Type: General Level of consciousness: awake Pain management: pain level controlled Vital Signs Assessment: post-procedure vital signs reviewed and stable Respiratory status: spontaneous breathing, nonlabored ventilation, respiratory function stable and patient connected to nasal cannula oxygen Cardiovascular status: blood pressure returned to baseline and stable Postop Assessment: no apparent nausea or vomiting Anesthetic complications: no   No notable events documented.  Last Vitals:  Vitals:   10/08/20 2100 10/08/20 2200  BP: (!) 132/107 (!) 132/59  Pulse: 98 82  Resp: 17 16  Temp: 37.5 C   SpO2: 95% 100%    Last Pain:  Vitals:   10/08/20 2149  TempSrc:   PainSc: Asleep    LLE Motor Response: Purposeful movement (10/08/20 2200)   RLE Motor Response: Purposeful movement (10/08/20 2200)        Arelyn Gauer P Earnie Bechard

## 2020-10-08 NOTE — ED Provider Notes (Addendum)
I assumed care of this patient.  Please see previous provider note for further details of Hx, PE.  Briefly patient is a 87 y.o. male who presented as a transfer [facility] after a fall resulting in subdural hematoma and a hip fracture.  Patient is anticoagulated on Xarelto.  He was given Kcentra prior to being transferred.  On arrival patient is hemodynamically stable with appropriate blood pressures.  Provided with additional pain medicine and muscle relaxers. Consulted trauma service for admission.  .Critical Care  Date/Time: 10/08/2020 2:33 AM Performed by: Nira Conn, MD Authorized by: Nira Conn, MD   Critical care provider statement:    Critical care time (minutes):  30   Critical care was necessary to treat or prevent imminent or life-threatening deterioration of the following conditions:  Trauma and CNS failure or compromise   Critical care was time spent personally by me on the following activities:  Discussions with consultants, evaluation of patient's response to treatment, examination of patient, ordering and performing treatments and interventions, ordering and review of laboratory studies, ordering and review of radiographic studies, pulse oximetry, re-evaluation of patient's condition, obtaining history from patient or surrogate and review of old charts   Care discussed with: admitting provider          Meaghan Whistler, Amadeo Garnet, MD 10/08/20 0234

## 2020-10-08 NOTE — Op Note (Addendum)
Date of Surgery: 10/08/2020  INDICATIONS: Mr. Carolyn Stare III is a 87 y.o.-year-old male who sustained a right hip fracture. The risks and benefits of the procedure discussed with the Patient prior to the procedure and all questions were answered; consent was obtained.  PREOPERATIVE DIAGNOSIS: right intertrochanteric hip fracture   POSTOPERATIVE DIAGNOSIS: Same   PROCEDURE:  Open treatment of intertrochanteric fracture with intramedullary implant. CPT [phone]  Application of incisional wound vac.    SURGEON: N. Glee Arvin, M.D.   ASSIST: Oneal Grout, [place]  ANESTHESIA: general   IV FLUIDS AND URINE: See anesthesia record   ESTIMATED BLOOD LOSS: 150 cc  IMPLANTS: [employer] InterTAN 11.5 x 40, 110/105 compression screws  DRAINS: None.   COMPLICATIONS: see description of procedure.   DESCRIPTION OF PROCEDURE: The patient was brought to the operating room and placed supine on the operating table. The patient's leg had been signed prior to the procedure. The patient had the anesthesia placed by the anesthesiologist. The prep verification and incision time-outs were performed to confirm that this was the correct patient, site, side and location. The patient had an SCD on the opposite lower extremity. The patient did receive antibiotics prior to the incision and was re-dosed during the procedure as needed at indicated intervals. The patient was positioned on the fracture table with the table in traction and internal rotation to reduce the hip. The well leg was placed in a scissor position and all bony prominences were well-padded. The patient had the lower extremity prepped and draped in the standard surgical fashion. The incision was made 4 finger breadths superior to the greater trochanter. A second incision was made lateral to the proximal femur in order to insert a cobb elevator along the proximal segment to reduce it back to the femoral shaft.  A guide pin was inserted into the  tip of the greater trochanter under fluoroscopic guidance. An opening reamer was used to gain access to the femoral canal. The nail length was measured and inserted down the femoral canal to its proper depth. The appropriate version of insertion for the lag screw was found under fluoroscopy. A pin was inserted up the femoral neck through the jig. Then, a second antirotation pin was inserted inferior to the first pin. The length of the lag screw was then measured. The lag screw was inserted as near to center-center in the head as possible. The antirotation pin was then taken out and an interdigitating compression screw was placed in its place. The leg was taken out of traction, then the interdigitating compression screw was used to compress across the fracture. Compression was visualized on serial xrays.  Because the lateral cortex was robust and that the fracture was of a stable pattern, I did not place a distal interlocking screw.  The wound was copiously irrigated with saline and the subcutaneous layer closed with 2.0 vicryl and the skin was reapproximated with 2.0 nylon and an incisional wound vac was placed over the lateral hip incision.  The hip was taken through a range of motion at the end of the case under fluoroscopic imaging to visualize the approach-withdraw phenomenon and confirm implant length in the head. The patient was then awakened from anesthesia and taken to the recovery room in stable condition. All counts were correct at the end of the case.   Tessa Lerner was necessary for opening, closing, retracting, limb positioning and overall facilitation and completion of the surgery.  POSTOPERATIVE PLAN: The patient will be  weight bearing as tolerated and will return in 2 weeks for staple removal and the patient will receive DVT prophylaxis based on other medications, activity level, and risk ratio of bleeding to thrombosis.   Mayra Reel, MD [facility] 6:51 PM

## 2020-10-08 NOTE — ED Notes (Signed)
Back from downtime : patient resting with no distress, respirations unlabored , IV site intact , Foley catheter unremarkable .

## 2020-10-08 NOTE — Progress Notes (Addendum)
Patient ID: Nathan Mitchell, male   DOB: March 28, 1931, 87 y.o.   MRN: [id number] Follow up - Trauma Critical Care  Patient Details:    Nathan Mitchell is an 87 y.o. male.  Lines/tubes : Urethral Catheter Double-lumen;Straight-tip 16 Fr. (Active)  Indication for Insertion or Continuance of Catheter Chronic catheter use 10/08/20 0653  Site Assessment Clean;Intact 10/08/20 0653  Catheter Maintenance Bag below level of bladder;Catheter secured;Drainage bag/tubing not touching floor;No dependent loops 10/08/20 0653  Collection Container Standard drainage bag 10/08/20 0653  Securement Method Securing device (Describe) 10/08/20 0653  Urinary Catheter Interventions (if applicable) Unclamped 10/08/20 0653  Output (mL) 300 mL 10/08/20 0600    Microbiology/Sepsis markers: Results for orders placed or performed during the hospital encounter of 10/07/20  Resp Panel by RT-PCR (Flu A&B, Covid) Nasopharyngeal Swab     Status: None   Collection Time: 10/07/20  6:50 PM   Specimen: Nasopharyngeal Swab; Nasopharyngeal(NP) swabs in vial transport medium  Result Value Ref Range Status   SARS Coronavirus 2 by RT PCR NEGATIVE NEGATIVE Final    Comment: (NOTE) SARS-CoV-2 target nucleic acids are NOT DETECTED.  The SARS-CoV-2 RNA is generally detectable in upper respiratory specimens during the acute phase of infection. The lowest concentration of SARS-CoV-2 viral copies this assay can detect is 138 copies/mL. A negative result does not preclude SARS-Cov-2 infection and should not be used as the sole basis for treatment or other patient management decisions. A negative result may occur with  improper specimen collection/handling, submission of specimen other than nasopharyngeal swab, presence of viral mutation(s) within the areas targeted by this assay, and inadequate number of viral copies(<138 copies/mL). A negative result must be combined with clinical observations, patient history, and  epidemiological information. The expected result is Negative.  Fact Sheet for Patients:  BloggerCourse.com  Fact Sheet for Healthcare Providers:  SeriousBroker.it  This test is no t yet approved or cleared by the [place] FDA and  has been authorized for detection and/or diagnosis of SARS-CoV-2 by FDA under an Emergency Use Authorization (EUA). This EUA will remain  in effect (meaning this test can be used) for the duration of the COVID-19 declaration under Section 564(b)(1) of the Act, 21 U.S.C.section 360bbb-3(b)(1), unless the authorization is terminated  or revoked sooner.       Influenza A by PCR NEGATIVE NEGATIVE Final   Influenza B by PCR NEGATIVE NEGATIVE Final    Comment: (NOTE) The Xpert Xpress SARS-CoV-2/FLU/RSV plus assay is intended as an aid in the diagnosis of influenza from Nasopharyngeal swab specimens and should not be used as a sole basis for treatment. Nasal washings and aspirates are unacceptable for Xpert Xpress SARS-CoV-2/FLU/RSV testing.  Fact Sheet for Patients: BloggerCourse.com  Fact Sheet for Healthcare Providers: SeriousBroker.it  This test is not yet approved or cleared by the [place] FDA and has been authorized for detection and/or diagnosis of SARS-CoV-2 by FDA under an Emergency Use Authorization (EUA). This EUA will remain in effect (meaning this test can be used) for the duration of the COVID-19 declaration under Section 564(b)(1) of the Act, 21 U.S.C. section 360bbb-3(b)(1), unless the authorization is terminated or revoked.  Performed at [facility], [address], [place], [place] [address]   MRSA PCR Screening     Status: None   Collection Time: 10/08/20  4:13 AM   Specimen: Nasal Mucosa; Nasopharyngeal  Result Value Ref Range Status   MRSA by PCR NEGATIVE NEGATIVE Final    Comment:  The  GeneXpert MRSA Assay (FDA approved for NASAL specimens only), is one component of a comprehensive MRSA colonization surveillance program. It is not intended to diagnose MRSA infection nor to guide or monitor treatment for MRSA infections. Performed at [facility], [address], [place], [place] [address]     Anti-infectives:  Anti-infectives (From admission, onward)    None       Consults: Treatment Team:  Tarry Kos, MD Jadene Pierini, MD    Studies:    Events:  Subjective:    Overnight Issues:   Objective:  Vital signs for last 24 hours: Temp:  [98.1 F (36.7 C)-99.5 F (37.5 C)] 99.5 F (37.5 C) (06/09 0418) Pulse Rate:  [74-123] 123 (06/09 0700) Resp:  [10-25] 24 (06/09 0700) BP: (84-155)/(66-141) 140/86 (06/09 0700) SpO2:  [93 %-100 %] 100 % (06/09 0700) Weight:  [78.1 kg] 78.1 kg (06/09 0418)  Hemodynamic parameters for last 24 hours:    Intake/Output from previous day: 06/08 0701 - 06/09 0700 In: 1084.9 [I.V.:1080.3; IV Piggyback:4.6] Out: 300 [Urine:300]  Intake/Output this shift: No intake/output data recorded.  Vent settings for last 24 hours:    Physical Exam:  General: no respiratory distress Neuro: disoriented but f/c HEENT/Neck: no JVD Resp: clear to auscultation bilaterally CVS: IRR IRR 100 GI: soft, nontender, BS WNL, no r/g Extremities:   tender deformity R hip  Results for orders placed or performed during the hospital encounter of 10/07/20 (from the past 24 hour(s))  CBC WITH DIFFERENTIAL     Status: Abnormal   Collection Time: 10/07/20  5:55 PM  Result Value Ref Range   WBC 9.6 4.0 - 10.5 K/uL   RBC 3.27 (L) 4.22 - 5.81 MIL/uL   Hemoglobin 9.7 (L) 13.0 - 17.0 g/dL   HCT 97.9 (L) 89.2 - 11.9 %   MCV 96.6 80.0 - 100.0 fL   MCH 29.7 26.0 - 34.0 pg   MCHC 30.7 30.0 - 36.0 g/dL   RDW 41.7 (H) 40.8 - 14.4 %   Platelets 232 150 - 400 K/uL   nRBC 0.0 0.0 - 0.2 %   Neutrophils Relative % 87 %   Neutro  Abs 8.2 (H) 1.7 - 7.7 K/uL   Lymphocytes Relative 7 %   Lymphs Abs 0.7 0.7 - 4.0 K/uL   Monocytes Relative 5 %   Monocytes Absolute 0.5 0.1 - 1.0 K/uL   Eosinophils Relative 1 %   Eosinophils Absolute 0.1 0.0 - 0.5 K/uL   Basophils Relative 0 %   Basophils Absolute 0.0 0.0 - 0.1 K/uL   Immature Granulocytes 0 %   Abs Immature Granulocytes 0.04 0.00 - 0.07 K/uL  Type and screen [facility]     Status: None   Collection Time: 10/07/20  5:55 PM  Result Value Ref Range   ABO/RH(D) A NEG    Antibody Screen NEG    Sample Expiration      10/10/2020,2359 Performed at [facility], [address], [place], [place] [address]   Basic metabolic panel     Status: Abnormal   Collection Time: 10/07/20  5:55 PM  Result Value Ref Range   Sodium 139 135 - 145 mmol/L   Potassium 4.5 3.5 - 5.1 mmol/L   Chloride 108 98 - 111 mmol/L   CO2 24 22 - 32 mmol/L   Glucose, Bld 113 (H) 70 - 99 mg/dL   BUN 23 8 - 23 mg/dL   Creatinine, Ser 3.14  0.61 - 1.24 mg/dL   Calcium 8.5 (L) 8.9 - 10.3 mg/dL   GFR, Estimated >28 >36 mL/min   Anion gap 7 5 - 15  Resp Panel by RT-PCR (Flu A&B, Covid) Nasopharyngeal Swab     Status: None   Collection Time: 10/07/20  6:50 PM   Specimen: Nasopharyngeal Swab; Nasopharyngeal(NP) swabs in vial transport medium  Result Value Ref Range   SARS Coronavirus 2 by RT PCR NEGATIVE NEGATIVE   Influenza A by PCR NEGATIVE NEGATIVE   Influenza B by PCR NEGATIVE NEGATIVE  Urinalysis, Routine w reflex microscopic Urine, Clean Catch     Status: Abnormal   Collection Time: 10/08/20 12:13 AM  Result Value Ref Range   Color, Urine YELLOW YELLOW   APPearance HAZY (A) CLEAR   Specific Gravity, Urine 1.021 1.005 - 1.030   pH 5.0 5.0 - 8.0   Glucose, UA NEGATIVE NEGATIVE mg/dL   Hgb urine dipstick LARGE (A) NEGATIVE   Bilirubin Urine NEGATIVE NEGATIVE   Ketones, ur 20 (A) NEGATIVE mg/dL   Protein, ur NEGATIVE NEGATIVE mg/dL   Nitrite NEGATIVE  NEGATIVE   Leukocytes,Ua LARGE (A) NEGATIVE   RBC / HPF >50 (H) 0 - 5 RBC/hpf   WBC, UA 21-50 0 - 5 WBC/hpf   Bacteria, UA RARE (A) NONE SEEN   Mucus PRESENT    Hyaline Casts, UA PRESENT   MRSA PCR Screening     Status: None   Collection Time: 10/08/20  4:13 AM   Specimen: Nasal Mucosa; Nasopharyngeal  Result Value Ref Range   MRSA by PCR NEGATIVE NEGATIVE  Type and screen [facility]     Status: None   Collection Time: 10/08/20  4:47 AM  Result Value Ref Range   ABO/RH(D) A NEG    Antibody Screen NEG    Sample Expiration      10/11/2020,2359 Performed at [facility], [address] [place], [place] [address]     Assessment & Plan: Present on Admission:  Closed right hip fracture (HCC)  Subdural hematoma (HCC)    LOS: 1 day   Additional comments:I reviewed the patient's new clinical lab test results. And CT head Fall out of chair TBI/R SDH - Dr. Maurice Small to consult, repeat CT head done this AM, Keppra for 7d, disoriented on exam, plan TBI team therapies post-op Anticoagulation with Xarelto - reversed with Kcentra, hold R hip FX - per Dr. Magnus Ivan FEN - IVF, NPO for possible surgery Chronic urinary retention - continue home foley, flomax VTE - PAS only Dispo - ICU, OR I spoke with his wife at the bedside. Critical Care Total Time*: 34 Minutes  Violeta Gelinas, MD, MPH, FACS Trauma & General Surgery Use [email or web address] to contact on call provider  10/08/2020  *Care during the described time interval was provided by me. I have reviewed this patient's available data, including medical history, events of note, physical examination and test results as part of my evaluation.

## 2020-10-08 NOTE — H&P (Signed)
Nathan Mitchell 08-18-1930  [id number].    Chief Complaint/Reason for Consult: multisystem trauma  HPI:  Nathan Mitchell is an 87 yo male who presented to the [facility] ED after a fall yesterday afternoon. He was sitting in a chair and tripped while trying to stand up. He has an indwelling Foley and his wife thinks he may have tripped over the tubing. He hit his head on the floor but denies loss of consciousness. On arrival to the ED his chief complaint was hip pain. Plain films showed an intertrochanteric fracture of the right femur. CT head/C spine showed a left subdural hematoma. Ortho and neurosurgery have both been consulted, and the patient was transferred to the [facility] ED for trauma evaluation and ICU admission. The patient remains hemodynamically stable and alert. He is on Xarelto at home for a history of a-fib. [facility] was given at [facility] prior to transfer. He is on methotrexate weekly for treatment of giant cell arteritis. He and his wife deny a history of frequent falls at home.  ROS: Review of Systems  Constitutional:  Negative for chills and fever.  Respiratory:  Negative for shortness of breath.   Cardiovascular:  Negative for chest pain.  Gastrointestinal:  Negative for abdominal pain.  Genitourinary:        Urinary retention, indwelling foley in place  Musculoskeletal:  Positive for falls.  Neurological:  Negative for loss of consciousness and weakness.   No family history on file.  Past Medical History:  Diagnosis Date   Atrial fibrillation (HCC)    Giant cell arteritis (HCC)     Past Surgical History:  Procedure Laterality Date   APPENDECTOMY     HERNIA REPAIR      Social History:  reports that he has quit smoking. He has never used smokeless tobacco. He reports current alcohol use of about 3.0 standard drinks of alcohol per week. He reports that he does not use drugs.  Allergies:  Allergies  Allergen Reactions   Tiotropium Bromide Monohydrate     Other reaction(s):  ineffecticve   Umeclidinium-Vilanterol     Other reaction(s): ineffective    Medications Prior to Admission  Medication Sig Dispense Refill   acetaminophen (TYLENOL) 500 MG tablet Take 500 mg by mouth every 6 (six) hours as needed for moderate pain.     cholecalciferol (VITAMIN D) 1000 units tablet Take 1,000 Units by mouth daily.     CRANBERRY EXTRACT PO Take 1 capsule by mouth at bedtime.     escitalopram (LEXAPRO) 20 MG tablet Take 20 mg by mouth daily.     ferrous gluconate (FERGON) 240 (27 FE) MG tablet Take 240 mg by mouth every Monday, Wednesday, and Friday.     folic acid (FOLVITE) 1 MG tablet Take 1 mg by mouth daily.     methotrexate (RHEUMATREX) 2.5 MG tablet Take 20 mg by mouth every Friday.     Omeprazole 20 MG TBEC Take 20 mg by mouth daily.      rivaroxaban (XARELTO) 20 MG TABS tablet Take 1 tablet (20 mg total) by mouth daily with supper. 30 tablet 0   tamsulosin (FLOMAX) 0.4 MG CAPS capsule Take 0.8 mg by mouth daily.     vitamin B-12 (CYANOCOBALAMIN) 1000 MCG tablet Take 1,000 mcg by mouth daily.     famotidine (PEPCID) 40 MG tablet Take 40 mg by mouth daily as needed.     HYDROcodone-acetaminophen (NORCO/VICODIN) 5-325 MG tablet Take 1 tablet by mouth every 6 (  six) hours as needed. (Patient taking differently: Take 1 tablet by mouth every 6 (six) hours as needed for moderate pain.) 15 tablet 0     Physical Exam: Blood pressure (!) 144/111, pulse 93, temperature 99.5 F (37.5 C), temperature source Oral, resp. rate 19, height 6\' 1"  (1.854 m), weight 78.1 kg, SpO2 100 %. General: resting comfortably, appears stated age, no apparent distress Neurological: alert and oriented, no focal deficits, cranial nerves grossly in tact HEENT: normocephalic, no scleral icterus, pupils equal. Superficial abrasion on scalp, no active bleeding. CV: regular rate and rhythm, extremities warm and well-perfused Respiratory: normal work of breathing, symmetric chest wall expansion, no chest  wall deformities or ecchymoses. Abdomen: soft, nondistended, nontender to deep palpation. No masses or organomegaly. Extremities: warm and well-perfused, right leg externally rotated Psychiatric: normal mood and affect Skin: warm and dry, no jaundice, no rashes  Results for orders placed or performed during the hospital encounter of 10/07/20 (from the past 48 hour(s))  CBC WITH DIFFERENTIAL     Status: Abnormal   Collection Time: 10/07/20  5:55 PM  Result Value Ref Range   WBC 9.6 4.0 - 10.5 K/uL   RBC 3.27 (L) 4.22 - 5.81 MIL/uL   Hemoglobin 9.7 (L) 13.0 - 17.0 g/dL   HCT 12/07/20 (L) 27.2 - 53.6 %   MCV 96.6 80.0 - 100.0 fL   MCH 29.7 26.0 - 34.0 pg   MCHC 30.7 30.0 - 36.0 g/dL   RDW 64.4 (H) 03.4 - 74.2 %   Platelets 232 150 - 400 K/uL   nRBC 0.0 0.0 - 0.2 %   Neutrophils Relative % 87 %   Neutro Abs 8.2 (H) 1.7 - 7.7 K/uL   Lymphocytes Relative 7 %   Lymphs Abs 0.7 0.7 - 4.0 K/uL   Monocytes Relative 5 %   Monocytes Absolute 0.5 0.1 - 1.0 K/uL   Eosinophils Relative 1 %   Eosinophils Absolute 0.1 0.0 - 0.5 K/uL   Basophils Relative 0 %   Basophils Absolute 0.0 0.0 - 0.1 K/uL   Immature Granulocytes 0 %   Abs Immature Granulocytes 0.04 0.00 - 0.07 K/uL    Comment: Performed at [facility], [address], [place], [place]  Type and screen [facility]     Status: None   Collection Time: 10/07/20  5:55 PM  Result Value Ref Range   ABO/RH(D) A NEG    Antibody Screen NEG    Sample Expiration      10/10/2020,2359 Performed at [facility], [address] [place], [place]   Basic metabolic panel     Status: Abnormal   Collection Time: 10/07/20  5:55 PM  Result Value Ref Range   Sodium 139 135 - 145 mmol/L   Potassium 4.5 3.5 - 5.1 mmol/L   Chloride 108 98 - 111 mmol/L   CO2 24 22 - 32 mmol/L   Glucose, Bld 113 (H) 70 - 99 mg/dL    Comment: Glucose reference range applies only to samples taken  after fasting for at least 8 hours.   BUN 23 8 - 23 mg/dL   Creatinine, Ser 12/07/20 0.61 - 1.24 mg/dL   Calcium 8.5 (L) 8.9 - 10.3 mg/dL   GFR, Estimated 9.51 >88 mL/min    Comment: (NOTE) Calculated using the CKD-EPI Creatinine Equation (2021)    Anion gap 7 5 - 15    Comment: Performed at [facility], [address]  [place], [place] [address]  Resp Panel by RT-PCR (Flu A&B, Covid) Nasopharyngeal Swab     Status: None   Collection Time: 10/07/20  6:50 PM   Specimen: Nasopharyngeal Swab; Nasopharyngeal(NP) swabs in vial transport medium  Result Value Ref Range   SARS Coronavirus 2 by RT PCR NEGATIVE NEGATIVE    Comment: (NOTE) SARS-CoV-2 target nucleic acids are NOT DETECTED.  The SARS-CoV-2 RNA is generally detectable in upper respiratory specimens during the acute phase of infection. The lowest concentration of SARS-CoV-2 viral copies this assay can detect is 138 copies/mL. A negative result does not preclude SARS-Cov-2 infection and should not be used as the sole basis for treatment or other patient management decisions. A negative result may occur with  improper specimen collection/handling, submission of specimen other than nasopharyngeal swab, presence of viral mutation(s) within the areas targeted by this assay, and inadequate number of viral copies(<138 copies/mL). A negative result must be combined with clinical observations, patient history, and epidemiological information. The expected result is Negative.  Fact Sheet for Patients:  BloggerCourse.com  Fact Sheet for Healthcare Providers:  SeriousBroker.it  This test is no t yet approved or cleared by the [place] FDA and  has been authorized for detection and/or diagnosis of SARS-CoV-2 by FDA under an Emergency Use Authorization (EUA). This EUA will remain  in effect (meaning this test can be used) for the duration of the COVID-19 declaration  under Section 564(b)(1) of the Act, 21 U.S.C.section 360bbb-3(b)(1), unless the authorization is terminated  or revoked sooner.       Influenza A by PCR NEGATIVE NEGATIVE   Influenza B by PCR NEGATIVE NEGATIVE    Comment: (NOTE) The Xpert Xpress SARS-CoV-2/FLU/RSV plus assay is intended as an aid in the diagnosis of influenza from Nasopharyngeal swab specimens and should not be used as a sole basis for treatment. Nasal washings and aspirates are unacceptable for Xpert Xpress SARS-CoV-2/FLU/RSV testing.  Fact Sheet for Patients: BloggerCourse.com  Fact Sheet for Healthcare Providers: SeriousBroker.it  This test is not yet approved or cleared by the [place] FDA and has been authorized for detection and/or diagnosis of SARS-CoV-2 by FDA under an Emergency Use Authorization (EUA). This EUA will remain in effect (meaning this test can be used) for the duration of the COVID-19 declaration under Section 564(b)(1) of the Act, 21 U.S.C. section 360bbb-3(b)(1), unless the authorization is terminated or revoked.  Performed at [facility], [address], [place], [place] [address]   Urinalysis, Routine w reflex microscopic Urine, Clean Catch     Status: Abnormal   Collection Time: 10/08/20 12:13 AM  Result Value Ref Range   Color, Urine YELLOW YELLOW   APPearance HAZY (A) CLEAR   Specific Gravity, Urine 1.021 1.005 - 1.030   pH 5.0 5.0 - 8.0   Glucose, UA NEGATIVE NEGATIVE mg/dL   Hgb urine dipstick LARGE (A) NEGATIVE   Bilirubin Urine NEGATIVE NEGATIVE   Ketones, ur 20 (A) NEGATIVE mg/dL   Protein, ur NEGATIVE NEGATIVE mg/dL   Nitrite NEGATIVE NEGATIVE   Leukocytes,Ua LARGE (A) NEGATIVE   RBC / HPF >50 (H) 0 - 5 RBC/hpf   WBC, UA 21-50 0 - 5 WBC/hpf   Bacteria, UA RARE (A) NONE SEEN   Mucus PRESENT    Hyaline Casts, UA PRESENT     Comment: Performed at [facility], [address] [place], [place] [address]   DG Chest 1 View  Result Date: 10/07/2020 CLINICAL DATA:  Right  hip fracture. EXAM: CHEST  1 VIEW COMPARISON:  July 28, 2015. FINDINGS: The heart size and mediastinal contours are within normal limits. Both lungs are clear. Moderate size hiatal hernia is noted. The visualized skeletal structures are unremarkable. IMPRESSION: No acute cardiopulmonary abnormality seen. Moderate size hiatal hernia. Aortic Atherosclerosis (ICD10-I70.0). Electronically Signed   By: Lupita Raider M.D.   On: 10/07/2020 18:52   CT Head Wo Contrast  Result Date: 10/07/2020 CLINICAL DATA:  Fall, head injury, cervical spine injury, chronic anticoagulation EXAM: CT HEAD WITHOUT CONTRAST CT CERVICAL SPINE WITHOUT CONTRAST TECHNIQUE: Multidetector CT imaging of the head and cervical spine was performed following the standard protocol without intravenous contrast. Multiplanar CT image reconstructions of the cervical spine were also generated. COMPARISON:  None. FINDINGS: CT HEAD FINDINGS Brain: A large left subdural hematoma overlies the left cerebral convexity with mixed attenuation in keeping with hemorrhage of varying attenuation. This measures up to 26 mm in thickness on coronal image # 48/5. There is significant mass effect upon the left cerebral hemisphere. There is resultant effacement of the overlying sulci and 7 mm left-to-right midline shift. The left lateral ventricle is mildly effaced. The right lateral ventricle is of normal size there is no evidence of entrapment. The suprasellar cistern is preserved. There is moderate parenchymal volume loss commensurate with the patient's age. Remote lacunar infarct within the left thalamus. Mild periventricular white matter changes are present likely reflecting the sequela of small vessel ischemia. Ventricular size is normal. Cerebellum is unremarkable. Vascular: No hyperdense vessel or unexpected calcification. Skull: Intact Sinuses/Orbits: Moderate bubbly  secretions within the right frontal sinus. No air-fluid levels. Remaining paranasal sinuses are clear. The orbits are unremarkable. Other: Mastoid air cells and middle ear cavities are clear. Mild frontal scalp soft tissue swelling noted at the vertex. CT CERVICAL SPINE FINDINGS Alignment: Normal cervical lordosis. 1-2 mm anterolisthesis of C5 upon C6 is likely degenerative in nature. Skull base and vertebrae: The craniocervical alignment is normal. Advanced degenerative changes are seen at the atlantodental articulation. No superimposed widening. No acute fracture of the cervical spine. Vertebral body height has been preserved. Soft tissues and spinal canal: No prevertebral fluid or swelling. No visible canal hematoma. Disc levels: There is degenerative calcification of the intervertebral discs throughout the cervical spine in keeping with changes of mild degenerative disc disease. Intervertebral disc height has been preserved. The prevertebral soft tissues are not thickened on sagittal reformats. Review of the axial images demonstrates multilevel uncovertebral and facet arthrosis resulting in multilevel moderate to severe neuroforaminal narrowing, most severe bilaterally at C3-4 and C4-5. Upper chest: Patchy ground-glass infiltrate within the lung apices is not fully assessed on this examination, but was better assessed on prior examination of 12/25/2019. Other: Extensive atherosclerotic calcification is noted within the carotid bifurcations bilaterally. IMPRESSION: Large left subdural hematoma overlying the left cerebral convexity demonstrating significant mass effect upon the left cerebral hemisphere with effacement of the left lateral ventricle, effacement of the overlying sulci, and resultant 7 mm left-to-right midline shift. Heterogeneous attenuation suggests hemorrhage at multiple points in time, as can be seen with chronic anticoagulation. Mild senescent change. Remote lacunar infarct within the left  thalamus. No acute fracture or listhesis of the cervical spine. Multilevel degenerative disc and degenerative joint disease resulting in moderate to severe neuroforaminal narrowing at C3-4 and C4-5. Extensive atherosclerotic calcification within the carotid bifurcations bilaterally. The degree of stenosis would be better assessed with dedicated sonography if indicated. Patchy ground-glass pulmonary infiltrate within the lung apices  bilaterally, not fully assessed on this exam. This was better assessed on CT examination of 12/25/2019. These results were called by telephone at the time of interpretation on 10/07/2020 at 9:43 pm to provider [facility] , who verbally acknowledged these results. Electronically Signed   By: Helyn Numbers MD   On: 10/07/2020 21:43   CT Cervical Spine Wo Contrast  Result Date: 10/07/2020 CLINICAL DATA:  Fall, head injury, cervical spine injury, chronic anticoagulation EXAM: CT HEAD WITHOUT CONTRAST CT CERVICAL SPINE WITHOUT CONTRAST TECHNIQUE: Multidetector CT imaging of the head and cervical spine was performed following the standard protocol without intravenous contrast. Multiplanar CT image reconstructions of the cervical spine were also generated. COMPARISON:  None. FINDINGS: CT HEAD FINDINGS Brain: A large left subdural hematoma overlies the left cerebral convexity with mixed attenuation in keeping with hemorrhage of varying attenuation. This measures up to 26 mm in thickness on coronal image # 48/5. There is significant mass effect upon the left cerebral hemisphere. There is resultant effacement of the overlying sulci and 7 mm left-to-right midline shift. The left lateral ventricle is mildly effaced. The right lateral ventricle is of normal size there is no evidence of entrapment. The suprasellar cistern is preserved. There is moderate parenchymal volume loss commensurate with the patient's age. Remote lacunar infarct within the left thalamus. Mild periventricular white matter  changes are present likely reflecting the sequela of small vessel ischemia. Ventricular size is normal. Cerebellum is unremarkable. Vascular: No hyperdense vessel or unexpected calcification. Skull: Intact Sinuses/Orbits: Moderate bubbly secretions within the right frontal sinus. No air-fluid levels. Remaining paranasal sinuses are clear. The orbits are unremarkable. Other: Mastoid air cells and middle ear cavities are clear. Mild frontal scalp soft tissue swelling noted at the vertex. CT CERVICAL SPINE FINDINGS Alignment: Normal cervical lordosis. 1-2 mm anterolisthesis of C5 upon C6 is likely degenerative in nature. Skull base and vertebrae: The craniocervical alignment is normal. Advanced degenerative changes are seen at the atlantodental articulation. No superimposed widening. No acute fracture of the cervical spine. Vertebral body height has been preserved. Soft tissues and spinal canal: No prevertebral fluid or swelling. No visible canal hematoma. Disc levels: There is degenerative calcification of the intervertebral discs throughout the cervical spine in keeping with changes of mild degenerative disc disease. Intervertebral disc height has been preserved. The prevertebral soft tissues are not thickened on sagittal reformats. Review of the axial images demonstrates multilevel uncovertebral and facet arthrosis resulting in multilevel moderate to severe neuroforaminal narrowing, most severe bilaterally at C3-4 and C4-5. Upper chest: Patchy ground-glass infiltrate within the lung apices is not fully assessed on this examination, but was better assessed on prior examination of 12/25/2019. Other: Extensive atherosclerotic calcification is noted within the carotid bifurcations bilaterally. IMPRESSION: Large left subdural hematoma overlying the left cerebral convexity demonstrating significant mass effect upon the left cerebral hemisphere with effacement of the left lateral ventricle, effacement of the overlying  sulci, and resultant 7 mm left-to-right midline shift. Heterogeneous attenuation suggests hemorrhage at multiple points in time, as can be seen with chronic anticoagulation. Mild senescent change. Remote lacunar infarct within the left thalamus. No acute fracture or listhesis of the cervical spine. Multilevel degenerative disc and degenerative joint disease resulting in moderate to severe neuroforaminal narrowing at C3-4 and C4-5. Extensive atherosclerotic calcification within the carotid bifurcations bilaterally. The degree of stenosis would be better assessed with dedicated sonography if indicated. Patchy ground-glass pulmonary infiltrate within the lung apices bilaterally, not fully assessed on this exam. This was better  assessed on CT examination of 12/25/2019. These results were called by telephone at the time of interpretation on 10/07/2020 at 9:43 pm to provider [facility] , who verbally acknowledged these results. Electronically Signed   By: Helyn NumbersAshesh  Parikh MD   On: 10/07/2020 21:43   DG HIP UNILAT WITH PELVIS 2-3 VIEWS RIGHT  Result Date: 10/07/2020 CLINICAL DATA:  Right hip deformity after fall. EXAM: DG HIP (WITH OR WITHOUT PELVIS) 2-3V RIGHT COMPARISON:  None. FINDINGS: Severely comminuted and displaced fracture is seen involving the intertrochanteric region of proximal right femur. IMPRESSION: Severely comminuted and displaced intertrochanteric fracture of proximal right femur. Electronically Signed   By: Lupita RaiderJames  Green Jr M.D.   On: 10/07/2020 18:51      Assessment/Plan 87 yo male s/p fall at home on Xarelto. Left subdural hematoma R intertrochanteric femur fracture  - Neurosurgery consulted (Dr. Maurice Smallstergard) - recommend repeat head CT in am prior to undergoing surgery for femur fracture - Ortho consulted, tentatively planning for operative fixation of hip fracture today, pending results of repeat head CT. - NPO for potential procedure, maintenance IV fluids - Multimodal pain control -  Xarelto on hold - VTE: SCDs, hold chemical DVT ppx in setting of intracranial bleeding - Admit to trauma service, ICU for neuro checks  Sophronia SimasShelby Izella Ybanez, MD [facility], Hepatobiliary and Pancreatic Surgery 10/08/20 4:28 AM

## 2020-10-09 LAB — CBC
HCT: 20.6 % — ABNORMAL LOW (ref 39.0–52.0)
HCT: 23.2 % — ABNORMAL LOW (ref 39.0–52.0)
Hemoglobin: 6.3 g/dL — CL (ref 13.0–17.0)
Hemoglobin: 7.4 g/dL — ABNORMAL LOW (ref 13.0–17.0)
MCH: 29.9 pg (ref 26.0–34.0)
MCH: 30.2 pg (ref 26.0–34.0)
MCHC: 30.6 g/dL (ref 30.0–36.0)
MCHC: 31.9 g/dL (ref 30.0–36.0)
MCV: 97.6 fL (ref 80.0–100.0)
MCV: 94.7 fL (ref 80.0–100.0)
Platelets: 224 10*3/uL (ref 150–400)
Platelets: 224 10*3/uL (ref 150–400)
RBC: 2.11 MIL/uL — ABNORMAL LOW (ref 4.22–5.81)
RBC: 2.45 MIL/uL — ABNORMAL LOW (ref 4.22–5.81)
RDW: 15.9 % — ABNORMAL HIGH (ref 11.5–15.5)
RDW: 16.6 % — ABNORMAL HIGH (ref 11.5–15.5)
WBC: 11.6 10*3/uL — ABNORMAL HIGH (ref 4.0–10.5)
WBC: 12.8 10*3/uL — ABNORMAL HIGH (ref 4.0–10.5)
nRBC: 0 % (ref 0.0–0.2)
nRBC: 0 % (ref 0.0–0.2)

## 2020-10-09 LAB — BASIC METABOLIC PANEL
Anion gap: 5 (ref 5–15)
BUN: 21 mg/dL (ref 8–23)
CO2: 25 mmol/L (ref 22–32)
Calcium: 8 mg/dL — ABNORMAL LOW (ref 8.9–10.3)
Chloride: 108 mmol/L (ref 98–111)
Creatinine, Ser: 1.09 mg/dL (ref 0.61–1.24)
GFR, Estimated: >60 mL/min (ref >60)
Glucose, Bld: 118 mg/dL — ABNORMAL HIGH (ref 70–99)
Potassium: 4.2 mmol/L (ref 3.5–5.1)
Sodium: 138 mmol/L (ref 135–145)

## 2020-10-09 LAB — PREPARE RBC (CROSSMATCH)

## 2020-10-09 MED ORDER — ORAL CARE MOUTH RINSE
15.0000 mL | Freq: Two times a day (BID) | OROMUCOSAL | Status: DC
  Administered 2020-10-09 – 2020-10-13 (×8): 15 mL via OROMUCOSAL

## 2020-10-09 MED ORDER — SODIUM CHLORIDE 0.9% IV SOLUTION: Freq: Once | INTRAVENOUS | Status: AC

## 2020-10-09 NOTE — Evaluation (Signed)
Occupational Therapy Evaluation Patient Details Name: Nathan Mitchell MRN: [id number] DOB: Feb 11, 1931 Today's Date: 10/09/2020    History of Present Illness 87 yo male presenting 6/8 after sustaining a fall at home (suspect the pt tripped over foley tubing) with c/o R hip pain. Imaging revealed R hip fx and L SDH.  s/p IM nail of R femur and burr hole placement on 6/9.  PMH includes: afib, giant cell arteritis osteopenia thoracic aortic aneurysm former smoker   Clinical Impression   Patient is s/p IM nail R femure and L SDH with burr hole  surgery resulting in functional limitations due to the deficits listed below (see OT problem list). Pt currently requires total +2 max (A) with egress exit from bed with RW to achieve static standing. Pt with pain limiting progression at this time. Pt with cognitive deficits noted.  Patient will benefit from skilled OT acutely to increase independence and safety with ADLS to allow discharge CIR.     Follow Up Recommendations  CIR    Equipment Recommendations  3 in 1 bedside commode;Wheelchair (measurements OT);Wheelchair cushion (measurements OT);Hospital bed    Recommendations for Other Services Rehab consult     Precautions / Restrictions Precautions Precautions: Fall Precaution Comments: ventric (drain to gravity, does not need to be clamped), foley, would vac R LE Restrictions Weight Bearing Restrictions: Yes RLE Weight Bearing: Weight bearing as tolerated      Mobility Bed Mobility Overal bed mobility: Needs Assistance             General bed mobility comments: use of bed egress to chair to complete bed mobility at eval due to limited length of ventricular tubing and pt pain levels. modA to pull    Transfers Overall transfer level: Needs assistance Equipment used: Rolling walker (2 wheeled) Transfers: Sit to/from Stand Sit to Stand: +2 physical assistance;Max assist         General transfer comment: counting to help  sequence pt readiness for task. pt needing cues for hand placement to prevent holding bed rail to transfer to RW in standing. pt with weight shift toward L LE to avoid R LE in standing    Balance Overall balance assessment: Needs assistance         Standing balance support: Bilateral upper extremity supported;During functional activity Standing balance-Leahy Scale: Poor Standing balance comment: depending on RW and therapist                           ADL either performed or assessed with clinical judgement   ADL Overall ADL's : Needs assistance/impaired Eating/Feeding: Minimal assistance;Bed level           Lower Body Bathing: Total assistance       Lower Body Dressing: Total assistance   Toilet Transfer: +2 for physical assistance;Maximal assistance             General ADL Comments: pt with short cord for the EVD so exiting the bed in egress used to help achieve standing.     Vision Baseline Vision/History: Legally blind Additional Comments: sees out R eye only 20% per wife. pt reports seeing gray and white and shadows. pt only able to make out a outline of therapist but no featuers     Perception     Praxis      Pertinent Vitals/Pain Pain Assessment: Faces Faces Pain Scale: Hurts whole lot Pain Location: R thigh/hip with movement Pain Descriptors / Indicators: Discomfort;Grimacing;Sharp;Shooting;Sore (  yelling) Pain Intervention(s): Monitored during session;Premedicated before session;Repositioned (4 out 10 supine at the end of session declines medication from RN when asked)     Hand Dominance Right   Extremity/Trunk Assessment Upper Extremity Assessment Upper Extremity Assessment: Overall WFL for tasks assessed   Lower Extremity Assessment Lower Extremity Assessment: Defer to PT evaluation RLE Deficits / Details: limited by significant pain in thigh/hip from fx. able to activate quad in gravity minimized position, full ROM at ankle. hx of  neuropathy in bilateral LE RLE: Unable to fully assess due to pain RLE Sensation: history of peripheral neuropathy LLE Deficits / Details: history of neruopathy to mid-calf bilaterally, able to complete full ROM at ankle, knee, and hip against gravity LLE Sensation: history of peripheral neuropathy LLE Coordination: decreased fine motor;decreased gross motor   Cervical / Trunk Assessment Cervical / Trunk Assessment: Kyphotic   Communication Communication Communication: No difficulties   Cognition Arousal/Alertness: Awake/alert Behavior During Therapy: WFL for tasks assessed/performed;Anxious (some anxiety related to movement) Overall Cognitive Status: Impaired/Different from baseline Area of Impairment: Awareness;Problem solving;Safety/judgement;Following commands                       Following Commands: Follows multi-step commands inconsistently Safety/Judgement: Decreased awareness of safety;Decreased awareness of deficits Awareness: Emergent Problem Solving: Slow processing;Difficulty sequencing General Comments: Pt asked to release and pulling. pt reaching for things asked to avoid. pt with visual deficits that adds to the cognitive deficits   General Comments  wound vac on R, requires [place] 2 L    Exercises Exercises: Other exercises Other Exercises Other Exercises: educated on R knee flexion extension and bil ankle pumps this session   Shoulder Instructions      Home Living Family/patient expects to be discharged to:: Private residence Living Arrangements: Spouse/significant other Available Help at Discharge: Family;Available 24 hours/day (lives with wife, 3 daughters locally) Type of Home: House Home Access: Level entry     Home Layout: Two level;Able to live on main level with bedroom/bathroom     Bathroom Shower/Tub: Producer, television/film/video: Standard     Home Equipment: Cane - single point;Shower seat;Grab bars - tub/shower;Grab bars -  toilet   Additional Comments: has 4 children in addition to wife (A)      Prior Functioning/Environment Level of Independence: Needs assistance  Gait / Transfers Assistance Needed: per family, should be using, cane but pt states he does not need AD and therefore does not use ADL's / Homemaking Assistance Needed: assist recently due to catheter            OT Problem List: Decreased strength;Decreased activity tolerance;Impaired balance (sitting and/or standing);Decreased range of motion;Impaired vision/perception;Decreased coordination;Decreased cognition;Decreased safety awareness;Decreased knowledge of precautions;Decreased knowledge of use of DME or AE;Cardiopulmonary status limiting activity;Impaired sensation;Increased edema;Pain      OT Treatment/Interventions: Self-care/ADL training;Therapeutic exercise;Neuromuscular education;Energy conservation;DME and/or AE instruction;Manual therapy;Modalities;Therapeutic activities;Cognitive remediation/compensation;Visual/perceptual remediation/compensation;Patient/family education;Balance training    OT Goals(Current goals can be found in the care plan section) Acute Rehab OT Goals Patient Stated Goal: to get him higher level before going home OT Goal Formulation: With patient/family Time For Goal Achievement: 10/23/20 Potential to Achieve Goals: Good  OT Frequency: Min 2X/week   Barriers to D/C: Other (comment) (wife able to give supervision (A))          Co-evaluation PT/OT/SLP Co-Evaluation/Treatment: Yes Reason for Co-Treatment: Complexity of the patient's impairments (multi-system involvement);Necessary to address cognition/behavior during functional activity;For patient/therapist safety;To address functional/ADL transfers PT  goals addressed during session: Mobility/safety with mobility;Balance;Strengthening/ROM;Proper use of DME OT goals addressed during session: ADL's and self-care;Proper use of Adaptive equipment and  DME;Strengthening/ROM      AM-PAC OT "6 Clicks" Daily Activity     Outcome Measure Help from another person eating meals?: A Little Help from another person taking care of personal grooming?: A Little Help from another person toileting, which includes using toliet, bedpan, or urinal?: A Lot Help from another person bathing (including washing, rinsing, drying)?: A Lot Help from another person to put on and taking off regular upper body clothing?: A Lot Help from another person to put on and taking off regular lower body clothing?: A Lot 6 Click Score: 14   End of Session Equipment Utilized During Treatment: Gait belt;Rolling walker;Oxygen Nurse Communication: Mobility status;Precautions  Activity Tolerance: Patient limited by pain Patient left: in bed;with call bell/phone within reach;with bed alarm set;with family/visitor present;with SCD's reapplied;with restraints reapplied  OT Visit Diagnosis: Unsteadiness on feet (R26.81);Muscle weakness (generalized) (M62.81);Pain Pain - Right/Left: Right Pain - part of body: Hip                Time: 1211-1238 OT Time Calculation (min): 27 min Charges:  OT General Charges $OT Visit: 1 Visit OT Evaluation $OT Eval Moderate Complexity: 1 Mod   Brynn, OTR/L  Acute Rehabilitation Services Pager: [phone] Office: [phone] .   Mateo Flow 10/09/2020, 2:49 PM

## 2020-10-09 NOTE — Progress Notes (Signed)
Neurosurgery Service Progress Note  Subjective: No acute events overnight, c/o hip pain, no headaches / N / V   Objective: Vitals:   10/09/20 0400 10/09/20 0530 10/09/20 0545 10/09/20 0612  BP: (!) 106/53 105/79 (!) 109/97 137/75  Pulse: 97 90 97 94  Resp: 18 18 18 18   Temp: 97.8 F (36.6 C) 97.8 F (36.6 C) 97.8 F (36.6 C)   TempSrc: Axillary Axillary    SpO2: 100% 99%  100%  Weight:      Height:        Physical Exam: Aox1 (2019, complaining he can't hail a cab), PERRL, gaze conjugate, Fcx4 but limited in casted RLE, mild drift in RUE  Assessment & Plan: 87 y.o. man s/p burr hole evacuation of SDH, exam improved post-op, likely with superimposed delirium.  -drain put out 80cc overnight, can d/c drain tomorrow if output continues to decline -hold SQH until 6/11 AFTER drain has been removed  8/11  10/09/20 7:55 AM

## 2020-10-09 NOTE — Evaluation (Signed)
Physical Therapy Evaluation Patient Details Name: Nathan Mitchell MRN: [id number] DOB: [birth year] Today's Date: 10/09/2020   History of Present Illness  87 yo male presenting 6/8 after sustaining a fall at home (suspect the pt tripped over foley tubing) with c/o R hip pain. Imaging revealed R hip fx and L SDH.  s/p IM nail of R femur and burr hole placement on 6/9. PMH includes: afib, giant cell arteritis osteopenia thoracic aortic aneurysm former smoker   Clinical Impression  Pt in bed upon arrival of PT, agreeable to evaluation at this time. Prior to admission the pt was mobilizing without assist but with use of cane intermittently in the home. The pt has recently had increased assist needed for ADLs due to foley, but reports he was previously independent. The pt now presents with limitations in functional mobility, strength, power, stability, and cognition due to above dx and resulting pain, and will continue to benefit from skilled PT to address these deficits. The pt was able to complete sit-stand transfer with maxA of 2 and max cues for hand positioning/sequencing to use RW. He is very internally distracted by pain, and requires max simple, multimodal cues to complete tasks at this time. The pt was able to demo good functional use of LLE, but full assessment of RLE limited by pain. The pt will continue to benefit from skilled PT acutely and following d/c to maximize return to independence prior to eventual return home.      Follow Up Recommendations CIR    Equipment Recommendations   (defer to post acute)    Recommendations for Other Services Rehab consult     Precautions / Restrictions Precautions Precautions: Fall Precaution Comments: ventric (drain to gravity, does not need to be clamped), foley Restrictions Weight Bearing Restrictions: Yes RLE Weight Bearing: Weight bearing as tolerated      Mobility  Bed Mobility Overal bed mobility: Needs Assistance              General bed mobility comments: use of bed egress to chair to complete bed mobility at eval due to limited length of ventricular tubing and pt pain levels. modA to pull trunk forward initially, pt able to assist some with use of bed raisl    Transfers Overall transfer level: Needs assistance Equipment used: Rolling walker (2 wheeled) Transfers: Sit to/from Stand Sit to Stand: +2 physical assistance;Max assist         General transfer comment: counting to help sequence pt readiness for task. pt needing cues for hand placement to prevent holding bed rail to transfer to RW in standing. pt with weight shift toward L LE to avoid R LE in standing  Ambulation/Gait             General Gait Details: deferred due to pt pain        Balance Overall balance assessment: Needs assistance Sitting-balance support: Bilateral upper extremity supported Sitting balance-Leahy Scale: Poor     Standing balance support: Bilateral upper extremity supported;During functional activity Standing balance-Leahy Scale: Poor Standing balance comment: depending on RW and therapist                             Pertinent Vitals/Pain Pain Assessment: Faces Faces Pain Scale: Hurts whole lot Pain Location: R thigh/hip with movement Pain Descriptors / Indicators: Discomfort;Grimacing;Sharp;Shooting;Sore (yelling) Pain Intervention(s): Monitored during session;Limited activity within patient's tolerance;Repositioned    Home Living Family/patient expects to be discharged to::  Private residence Living Arrangements: Spouse/significant other Available Help at Discharge: Family;Available 24 hours/day (lives with wife, 3 daughters locally) Type of Home: House Home Access: Level entry     Home Layout: Two level;Able to live on main level with bedroom/bathroom Home Equipment: Gilmer Mor - single point;Shower seat;Grab bars - tub/shower;Grab bars - toilet Additional Comments: has 4 children in addition to  wife (A)    Prior Function Level of Independence: Needs assistance   Gait / Transfers Assistance Needed: per family, should be using, cane but pt states he does not need AD and therefore does not use  ADL's / Homemaking Assistance Needed: assist reccently due to catheter        Hand Dominance   Dominant Hand: Right    Extremity/Trunk Assessment   Upper Extremity Assessment Upper Extremity Assessment: Defer to OT evaluation    Lower Extremity Assessment Lower Extremity Assessment: RLE deficits/detail;LLE deficits/detail RLE Deficits / Details: limited by significant pain in thigh/hip from fx. able to activate quad in gravity minimized position, full ROM at ankle. hx of neuropathy in bilateral LE RLE: Unable to fully assess due to pain RLE Sensation: history of peripheral neuropathy LLE Deficits / Details: history of neruopathy to mid-calf bilaterally, able to complete full ROM at ankle, knee, and hip against gravity LLE Sensation: history of peripheral neuropathy LLE Coordination: decreased fine motor;decreased gross motor    Cervical / Trunk Assessment Cervical / Trunk Assessment: Kyphotic  Communication   Communication: No difficulties  Cognition Arousal/Alertness: Awake/alert Behavior During Therapy: WFL for tasks assessed/performed;Anxious (some anxiety related to movement) Overall Cognitive Status: Impaired/Different from baseline Area of Impairment: Awareness;Problem solving;Safety/judgement;Following commands                       Following Commands: Follows multi-step commands inconsistently Safety/Judgement: Decreased awareness of safety;Decreased awareness of deficits Awareness: Emergent Problem Solving: Slow processing;Difficulty sequencing General Comments: Pt asked to release and pulling. pt reaching for things asked to avoid. pt with visual deficits that adds to the cognitive deficits      General Comments General comments (skin integrity, edema,  etc.): wound vac on R, requires [place] 2 L    Exercises Other Exercises Other Exercises: educated on R knee flexion extension and bil ankle pumps this session   Assessment/Plan    PT Assessment Patient needs continued PT services  PT Problem List Decreased strength;Decreased range of motion;Decreased activity tolerance;Decreased balance;Decreased mobility;Decreased cognition;Pain       PT Treatment Interventions DME instruction;Gait training;Stair training;Functional mobility training;Therapeutic activities;Therapeutic exercise;Balance training;Cognitive remediation;Patient/family education    PT Goals (Current goals can be found in the Care Plan section)  Acute Rehab PT Goals Patient Stated Goal: to get him higher level before going home PT Goal Formulation: With patient Time For Goal Achievement: 10/23/20 Potential to Achieve Goals: Good    Frequency Min 4X/week   Barriers to discharge        Co-evaluation PT/OT/SLP Co-Evaluation/Treatment: Yes Reason for Co-Treatment: Complexity of the patient's impairments (multi-system involvement);Necessary to address cognition/behavior during functional activity;For patient/therapist safety;To address functional/ADL transfers PT goals addressed during session: Mobility/safety with mobility;Balance;Strengthening/ROM;Proper use of DME OT goals addressed during session: ADL's and self-care;Proper use of Adaptive equipment and DME;Strengthening/ROM       AM-PAC PT "6 Clicks" Mobility  Outcome Measure Help needed turning from your back to your side while in a flat bed without using bedrails?: Total Help needed moving from lying on your back to sitting on the side of a  flat bed without using bedrails?: A Lot Help needed moving to and from a bed to a chair (including a wheelchair)?: A Lot Help needed standing up from a chair using your arms (e.g., wheelchair or bedside chair)?: A Lot Help needed to walk in hospital room?: Total Help needed  climbing 3-5 steps with a railing? : Total 6 Click Score: 9    End of Session Equipment Utilized During Treatment: Gait belt;Oxygen Activity Tolerance: Patient tolerated treatment well;Patient limited by pain Patient left: in bed;with call bell/phone within reach;with bed alarm set;with family/visitor present Nurse Communication: Mobility status PT Visit Diagnosis: Unsteadiness on feet (R26.81);Other abnormalities of gait and mobility (R26.89);Pain Pain - Right/Left: Right Pain - part of body: Hip    Time: 1206-1234 PT Time Calculation (min) (ACUTE ONLY): 28 min   Charges:   PT Evaluation $PT Eval Moderate Complexity: 1 Mod          Rolm Baptise, PT, DPT   Acute Rehabilitation Department Pager #: [phone]  Gaetana Michaelis 10/09/2020, 3:44 PM

## 2020-10-09 NOTE — Progress Notes (Signed)
Subjective: 1 Day Post-Op Procedure(s) (LRB): RIGHT INTRAMEDULLARY (IM) NAIL INTERTROCHANTRIC WITH PREVENA WOUND VAC APPLICATION (Right) LEFT BURR HOLE DRAINAGE OF SUBDURAL HEMATOMA (Left) Patient reports pain as moderate.  Seems slightly confused this am.   Objective: Vital signs in last 24 hours: Temp:  [97.8 F (36.6 C)-99.5 F (37.5 C)] 97.8 F (36.6 C) (06/10 0545) Pulse Rate:  [80-160] 94 (06/10 0612) Resp:  [13-21] 18 (06/10 0612) BP: (101-149)/(51-107) 137/75 (06/10 0612) SpO2:  [93 %-100 %] 100 % (06/10 0612)  Intake/Output from previous day: 06/09 0701 - 06/10 0700 In: 2525.4 [I.V.:2085.8; IV Piggyback:439.7] Out: 1180 [Urine:900; Drains:80; Blood:200] Intake/Output this shift: Total I/O In: 253.3 [Blood:253.3] Out: -   Recent Labs    10/07/20 1755 10/09/20 0259  HGB 9.7* 6.3*   Recent Labs    10/07/20 1755 10/09/20 0259  WBC 9.6 11.6*  RBC 3.27* 2.11*  HCT 31.6* 20.6*  PLT 232 224   Recent Labs    10/07/20 1755 10/09/20 0259  NA 139 138  K 4.5 4.2  CL 108 108  CO2 24 25  BUN 23 21  CREATININE 1.04 1.09  GLUCOSE 113* 118*  CALCIUM 8.5* 8.0*   No results for input(s): LABPT, INR in the last 72 hours.  Neurovascular intact Sensation intact distally Intact pulses distally Dorsiflexion/Plantar flexion intact Incision: scant drainage No cellulitis present Compartment soft   Assessment/Plan: 1 Day Post-Op Procedure(s) (LRB): RIGHT INTRAMEDULLARY (IM) NAIL INTERTROCHANTRIC WITH PREVENA WOUND VAC APPLICATION (Right) LEFT BURR HOLE DRAINAGE OF SUBDURAL HEMATOMA (Left) Up with therapy Wbat RLE ABLA- being transfused this am Ok to restart xarelto from ortho standpoint (obviously needs to wait for neuro clearance) Continue with prevena wound vac Dry dressing change prn D/c dispo per primary team F/u with Dr. Roda Shutters 2 weeks po      Cristie Hem 10/09/2020, 8:18 AM

## 2020-10-09 NOTE — Progress Notes (Signed)
Inpatient Rehab Admissions Coordinator Note:   Per therapy recommendations, pt was screened for CIR candidacy by Estill Dooms, PT, DPT.  At this time we are recommending a CIR consult and I will place an order per our protocol.  Please contact me with questions.   Estill Dooms, PT, DPT [phone] 10/09/20 3:52 PM

## 2020-10-09 NOTE — Progress Notes (Signed)
Patient ID: Nathan Mitchell, male   DOB: 05-06-1930, 87 y.o.   MRN: [id number] Follow up - Trauma Critical Care  Patient Details:    Nathan Mitchell is an 87 y.o. male.  Lines/tubes : Negative Pressure Wound Therapy Hip Right (Active)  Site / Wound Assessment Dressing in place / Unable to assess 10/08/20 2126  Cycle Continuous 10/08/20 2126  Target Pressure (mmHg) 125 10/08/20 2126  Machine plugged into wall outlet (NOT bed outlet) Yes 10/08/20 2126  Dressing Status Intact 10/08/20 2126  Drainage Amount None 10/08/20 2126     Urethral Catheter Double-lumen;Straight-tip 16 Fr. (Active)  Indication for Insertion or Continuance of Catheter Chronic catheter use 10/09/20 0400  Site Assessment Clean;Intact 10/09/20 0400  Catheter Maintenance Bag below level of bladder;Drainage bag/tubing not touching floor;No dependent loops 10/09/20 0400  Collection Container Standard drainage bag 10/09/20 0400  Securement Method Securing device (Describe) 10/09/20 0400  Urinary Catheter Interventions (if applicable) Unclamped 10/08/20 2020  Output (mL) 250 mL 10/08/20 1539     ICP/Ventriculostomy Ventricular drainage catheter Left Parietal region (Active)  Drain Status Open 10/09/20 0600  Status Open to continuous drainage 10/09/20 0600  CSF Color Other (Comment) 10/08/20 2020  Site Assessment Clean;Dry 10/09/20 0600  Dressing Status Clean;Dry;Intact 10/09/20 0600  Output (mL) 80 mL 10/09/20 0606    Microbiology/Sepsis markers: Results for orders placed or performed during the hospital encounter of 10/07/20  Resp Panel by RT-PCR (Flu A&B, Covid) Nasopharyngeal Swab     Status: None   Collection Time: 10/07/20  6:50 PM   Specimen: Nasopharyngeal Swab; Nasopharyngeal(NP) swabs in vial transport medium  Result Value Ref Range Status   SARS Coronavirus 2 by RT PCR NEGATIVE NEGATIVE Final    Comment: (NOTE) SARS-CoV-2 target nucleic acids are NOT DETECTED.  The SARS-CoV-2 RNA is generally  detectable in upper respiratory specimens during the acute phase of infection. The lowest concentration of SARS-CoV-2 viral copies this assay can detect is 138 copies/mL. A negative result does not preclude SARS-Cov-2 infection and should not be used as the sole basis for treatment or other patient management decisions. A negative result may occur with  improper specimen collection/handling, submission of specimen other than nasopharyngeal swab, presence of viral mutation(s) within the areas targeted by this assay, and inadequate number of viral copies(<138 copies/mL). A negative result must be combined with clinical observations, patient history, and epidemiological information. The expected result is Negative.  Fact Sheet for Patients:  BloggerCourse.com  Fact Sheet for Healthcare Providers:  SeriousBroker.it  This test is no t yet approved or cleared by the [place] FDA and  has been authorized for detection and/or diagnosis of SARS-CoV-2 by FDA under an Emergency Use Authorization (EUA). This EUA will remain  in effect (meaning this test can be used) for the duration of the COVID-19 declaration under Section 564(b)(1) of the Act, 21 U.S.C.section 360bbb-3(b)(1), unless the authorization is terminated  or revoked sooner.       Influenza A by PCR NEGATIVE NEGATIVE Final   Influenza B by PCR NEGATIVE NEGATIVE Final    Comment: (NOTE) The Xpert Xpress SARS-CoV-2/FLU/RSV plus assay is intended as an aid in the diagnosis of influenza from Nasopharyngeal swab specimens and should not be used as a sole basis for treatment. Nasal washings and aspirates are unacceptable for Xpert Xpress SARS-CoV-2/FLU/RSV testing.  Fact Sheet for Patients: BloggerCourse.com  Fact Sheet for Healthcare Providers: SeriousBroker.it  This test is not yet approved or cleared by the [place]  FDA  and has been authorized for detection and/or diagnosis of SARS-CoV-2 by FDA under an Emergency Use Authorization (EUA). This EUA will remain in effect (meaning this test can be used) for the duration of the COVID-19 declaration under Section 564(b)(1) of the Act, 21 U.S.C. section 360bbb-3(b)(1), unless the authorization is terminated or revoked.  Performed at [facility], [address], [place], [place] [address]   MRSA PCR Screening     Status: None   Collection Time: 10/08/20  4:13 AM   Specimen: Nasal Mucosa; Nasopharyngeal  Result Value Ref Range Status   MRSA by PCR NEGATIVE NEGATIVE Final    Comment:        The GeneXpert MRSA Assay (FDA approved for NASAL specimens only), is one component of a comprehensive MRSA colonization surveillance program. It is not intended to diagnose MRSA infection nor to guide or monitor treatment for MRSA infections. Performed at [facility], [address] [place], [place] [address]     Anti-infectives:  Anti-infectives (From admission, onward)    Start     Dose/Rate Route Frequency Ordered Stop   10/08/20 1400  ceFAZolin (ANCEF) IVPB 2g/100 mL premix        2 g 200 mL/hr over 30 Minutes Intravenous On call to O.R. 10/08/20 0955 10/08/20 1810       Best Practice/Protocols:  VTE Prophylaxis: Mechanical .  Consults: Treatment Team:  Tarry Kos, MD Jadene Pierini, MD    Studies:    Events:  Subjective:    Overnight Issues:   Objective:  Vital signs for last 24 hours: Temp:  [97.8 F (36.6 C)-99.5 F (37.5 C)] 97.8 F (36.6 C) (06/10 0545) Pulse Rate:  [80-160] 94 (06/10 0612) Resp:  [13-21] 18 (06/10 0612) BP: (101-149)/(51-107) 137/75 (06/10 0612) SpO2:  [93 %-100 %] 100 % (06/10 0612)  Hemodynamic parameters for last 24 hours:    Intake/Output from previous day: 06/09 0701 - 06/10 0700 In: 2525.4 [I.V.:2085.8; IV Piggyback:439.7] Out: 1180 [Urine:900; Drains:80;  Blood:200]  Intake/Output this shift: Total I/O In: 253.3 [Blood:253.3] Out: -   Vent settings for last 24 hours:    Physical Exam:  General: no respiratory distress Neuro: alert and oriented to state and city, not to year, F/C HEENT/Neck: drain from burr hole Resp: clear to auscultation bilaterally CVS: IRR GI: soft, nontender, BS WNL, no r/g Extremities: calves soft  Results for orders placed or performed during the hospital encounter of 10/07/20 (from the past 24 hour(s))  CBC     Status: Abnormal   Collection Time: 10/09/20  2:59 AM  Result Value Ref Range   WBC 11.6 (H) 4.0 - 10.5 K/uL   RBC 2.11 (L) 4.22 - 5.81 MIL/uL   Hemoglobin 6.3 (LL) 13.0 - 17.0 g/dL   HCT 74.9 (L) 44.9 - 67.5 %   MCV 97.6 80.0 - 100.0 fL   MCH 29.9 26.0 - 34.0 pg   MCHC 30.6 30.0 - 36.0 g/dL   RDW 91.6 (H) 38.4 - 66.5 %   Platelets 224 150 - 400 K/uL   nRBC 0.0 0.0 - 0.2 %  Basic metabolic panel     Status: Abnormal   Collection Time: 10/09/20  2:59 AM  Result Value Ref Range   Sodium 138 135 - 145 mmol/L   Potassium 4.2 3.5 - 5.1 mmol/L   Chloride 108 98 - 111 mmol/L   CO2 25 22 - 32 mmol/L   Glucose, Bld 118 (H) 70 -  99 mg/dL   BUN 21 8 - 23 mg/dL   Creatinine, Ser 6.71 0.61 - 1.24 mg/dL   Calcium 8.0 (L) 8.9 - 10.3 mg/dL   GFR, Estimated >24 >58 mL/min   Anion gap 5 5 - 15  Prepare RBC (crossmatch)     Status: None   Collection Time: 10/09/20  5:29 AM  Result Value Ref Range   Order Confirmation      ORDER PROCESSED BY BLOOD BANK Performed at [facility], [address] [place], [place] [address]     Assessment & Plan: Present on Admission:  Closed right hip fracture (HCC)  Subdural hematoma (HCC)    LOS: 2 days   Additional comments:I reviewed the patient's new clinical lab test results. / Fall out of chair TBI/R SDH - S/P burr hole evac by Dr. Maurice Small 6/9, has drain in place, Keppra for 7d, exam improved some, plan TBI team therapies Anticoagulation with  Xarelto - reversed with Kcentra, hold R hip FX - S/P ORIF by Dr. Roda Shutters 6/9, WBAT ABL anemia - post-op, got 1u PRBC earlier this AM, CBC to F/U FEN - IVF, try clears Chronic urinary retention - continue home foley, flomax VTE - PAS only, no LMWH until drain out per Dr. Varney Daily - ICU  Critical Care Total Time*: 37 Minutes  Violeta Gelinas, MD, MPH, FACS Trauma & General Surgery Use [email or web address] to contact on call provider  10/09/2020  *Care during the described time interval was provided by me. I have reviewed this patient's available data, including medical history, events of note, physical examination and test results as part of my evaluation.

## 2020-10-09 NOTE — Discharge Instructions (Signed)
° ° °  1. Change dressings as needed °2. May shower but keep incisions covered and dry °3. Take lovenox to prevent blood clots °4. Take stool softeners as needed °5. Take pain meds as needed ° °

## 2020-10-09 NOTE — Progress Notes (Signed)
Patient ID: Nathan Mitchell, male   DOB: Jun 21, 1930, 87 y.o.   MRN: [id number] I met with his wife and daughter at the bedside for a clinical update.  Georganna Skeans, MD, MPH, FACS Please use [email or web address] to contact on call provider

## 2020-10-10 LAB — BASIC METABOLIC PANEL
Anion gap: 9 (ref 5–15)
BUN: 19 mg/dL (ref 8–23)
CO2: 24 mmol/L (ref 22–32)
Calcium: 8 mg/dL — ABNORMAL LOW (ref 8.9–10.3)
Chloride: 104 mmol/L (ref 98–111)
Creatinine, Ser: 1.05 mg/dL (ref 0.61–1.24)
GFR, Estimated: >60 mL/min (ref >60)
Glucose, Bld: 120 mg/dL — ABNORMAL HIGH (ref 70–99)
Potassium: 4 mmol/L (ref 3.5–5.1)
Sodium: 137 mmol/L (ref 135–145)

## 2020-10-10 LAB — CBC
HCT: 21.8 % — ABNORMAL LOW (ref 39.0–52.0)
Hemoglobin: 7 g/dL — ABNORMAL LOW (ref 13.0–17.0)
MCH: 30.3 pg (ref 26.0–34.0)
MCHC: 32.1 g/dL (ref 30.0–36.0)
MCV: 94.4 fL (ref 80.0–100.0)
Platelets: 206 10*3/uL (ref 150–400)
RBC: 2.31 MIL/uL — ABNORMAL LOW (ref 4.22–5.81)
RDW: 16.7 % — ABNORMAL HIGH (ref 11.5–15.5)
WBC: 12.9 10*3/uL — ABNORMAL HIGH (ref 4.0–10.5)
nRBC: 0.2 % (ref 0.0–0.2)

## 2020-10-10 MED ORDER — ENOXAPARIN SODIUM 40 MG/0.4ML IJ SOSY
40.0000 mg | PREFILLED_SYRINGE | Freq: Every day | INTRAMUSCULAR | Status: DC
  Administered 2020-10-10 – 2020-10-11 (×2): 40 mg via SUBCUTANEOUS
  Filled 2020-10-10 (×2): qty 0.4

## 2020-10-10 NOTE — Progress Notes (Signed)
Inpatient Rehab Admissions Coordinator:   I met with pt. And wife at bedside to discuss potential CIR admit. Pt. Remains confused and is not quite medically ready for CIR at this time, but I will follow for potential admit pending medial readiness, insurance auth, and bed availability.   Clemens Catholic, [place], [place] Admissions Coordinator  [phone] ([place]) [phone] (office)

## 2020-10-10 NOTE — Progress Notes (Signed)
Physical Therapy Treatment Patient Details Name: Nathan Mitchell MRN: [id number] DOB: [birth year] Today's Date: 10/10/2020    History of Present Illness 87 yo male presenting 6/8 after sustaining a fall at home (suspect the pt tripped over foley tubing) with c/o R hip pain. Imaging revealed R hip fx and L SDH.  s/p IM nail of R femur and burr hole placement on 6/9. PMH includes: afib, giant cell arteritis osteopenia thoracic aortic aneurysm former smoker    PT Comments    Began session with lower extremity warm-up, AAROM at R leg, to improve pain, decrease stiffness, and prepare for mobility. Focused on coming to stand from bed egress position with modAx2 and performing pre-gait training x2 bouts. Pt performed lateral weight shifting with bil UE support and modAx2 but was unable to clear either foot to march in place without either knee buckling. Pt with poor insight into his deficits and importance of mobility. Will continue to follow acutely. Current recommendations remain appropriate.   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 modA to pull trunk forward initially, pt able to assist some with use of bed rails    Transfers Overall transfer level: Needs assistance Equipment used: 2 person hand held assist Transfers: Sit to/from Stand Sit to Stand: +2 physical  assistance;Mod assist         General transfer comment: Pt pushing up from rails first rep and then holding onto arms of therapist and tech for second rep from egress bed position, modAx2 to power up and steady with bil knee block.  Ambulation/Gait Ambulation/Gait assistance: Mod assist;Max assist;+2 physical assistance   Assistive device: 2 person hand held assist       General Gait Details: Pre-gait training, cuing pt to shift weight laterally between feet first standing bout modAx2 and then attempt to march in place 2nd bout maxAx2 as pt's knees would buckle and needed blocking. Pt then became agitated and therefore ended session per pt request.   Stairs             Wheelchair Mobility    Modified Rankin (Stroke Patients Only) Modified Rankin (Stroke Patients Only) Pre-Morbid Rankin Score: Moderate disability Modified Rankin: Severe disability     Balance Overall balance assessment: Needs assistance Sitting-balance support: Bilateral upper extremity supported Sitting balance-Leahy Scale: Poor Sitting balance - Comments: Bil UEs on rails of bed to sit in egress position.   Standing balance support: Bilateral upper extremity supported;During functional activity Standing balance-Leahy Scale: Poor Standing balance comment: depending on UE support and therapist                            Cognition Arousal/Alertness: Awake/alert Behavior During Therapy: Anxious;Agitated;Impulsive (some anxiety related to movement) Overall Cognitive Status: Impaired/Different from baseline Area of Impairment: Awareness;Problem solving;Safety/judgement;Following  commands                       Following Commands: Follows multi-step commands inconsistently;Follows multi-step commands with increased time Safety/Judgement: Decreased awareness of safety;Decreased awareness of deficits Awareness: Emergent Problem Solving: Slow processing;Difficulty sequencing;Requires  verbal cues;Requires tactile cues General Comments: RN notified PT that 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 Pt needing multi-modal cues and increased time to process as pt displays some anxiety with mobility. Pt able to initiate tasks but becomes resistive when he experiences pain or anxiety with mobility. Pt with decreased deficits and safety awareness, reaching for a pole on wheels for stability despite max cues to hold onto more steady therapist. Pt became agitated with increased mobility thus ended session per pt request.      Exercises General Exercises - Lower Extremity Ankle Circles/Pumps: Both;Supine;Seated;20 reps Quad Sets: Both;Other reps (comment);Supine;AAROM;AROM (x3, AAROM R leg) Short Arc Quad: Both;10 reps;Seated;AROM Heel Slides: AAROM;AROM;Both;Other reps (comment);Supine (x3 reps, AAROM R leg) Straight Leg Raises: Both;5 reps;AROM;AAROM;Supine (AAROM R leg)    General Comments General comments (skin integrity, edema, etc.): HR up to 144 bpm with standing, returned to sit with it improving to 110-120s      Pertinent Vitals/Pain Pain Assessment: Faces Pain Score: 5  Faces Pain Scale: Hurts whole lot Pain Location: R thigh/hip with movement Pain Descriptors / Indicators: Discomfort;Grimacing;Guarding;Moaning (yelling) Pain Intervention(s): Monitored during session;Limited activity within patient's tolerance;Premedicated before session;Repositioned    Home Living     Available Help at Discharge: Family;Available 24 hours/day Type of Home: House              Prior Function            PT Goals (current goals can now be found in the care plan section) Acute Rehab PT Goals Patient Stated Goal: to eventually walk again PT Goal Formulation: With patient Time For Goal Achievement: 10/23/20 Potential to Achieve Goals: Good Progress towards PT goals: Progressing toward goals    Frequency    Min 4X/week      PT Plan Current plan remains appropriate     Co-evaluation              AM-PAC PT "6 Clicks" Mobility   Outcome Measure  Help needed turning from your back to your side while in a flat bed without using bedrails?: Total Help needed moving from lying on your back to sitting on the side of a flat bed without using bedrails?: A Lot Help needed moving to and from a bed to a chair (including a wheelchair)?: A Lot Help needed standing up from a chair using your arms (e.g., wheelchair or bedside chair)?: A Lot Help needed to walk in hospital room?: Total Help needed climbing 3-5 steps with a railing? : Total 6 Click Score: 9    End of Session Equipment Utilized During Treatment: Gait belt Activity Tolerance: Patient limited by pain;Other (comment);Treatment limited secondary to agitation (limited by anxiety) Patient left: in bed;with call bell/phone within reach;with bed alarm set;with restraints reapplied;with SCD's reapplied Nurse Communication: Mobility status;Other (comment) PT Visit Diagnosis: Unsteadiness on feet (R26.81);Other abnormalities of gait and mobility (R26.89);Pain;Muscle weakness (generalized) (M62.81);Difficulty in walking, not elsewhere classified (R26.2) Pain - Right/Left: Right Pain - part of body: Hip     Time: 1208-1232 PT Time Calculation (min) (ACUTE ONLY): 24 min  Charges:  $Gait Training: 8-22 mins $Therapeutic Exercise: 8-22 mins  Raymond Gurney, PT, DPT Acute Rehabilitation Services  Pager: [phone] Office: [phone]    Jewel Baize 10/10/2020, 1:11 PM

## 2020-10-10 NOTE — Evaluation (Signed)
Speech Language Pathology Evaluation Patient Details Name: Nathan Mitchell MRN: [id number] DOB: 1930/07/28 Today's Date: 10/10/2020 Time: 2440-1027 SLP Time Calculation (min) (ACUTE ONLY): 28 min  Problem List:  Patient Active Problem List   Diagnosis Date Noted   Subdural hematoma (HCC) 10/08/2020   Acute pain due to trauma 10/08/2020   Closed right hip fracture (HCC) 10/07/2020   Abnormal gait 10/07/2020   Aortic regurgitation 10/07/2020   Aortic root dilatation (HCC) 10/07/2020   Atrial fibrillation (HCC) 10/07/2020   Chronic obstructive pulmonary disease, unspecified (HCC) 10/07/2020   Gastroesophageal reflux disease 10/07/2020   Hardening of the aorta (main artery of the heart) (HCC) 10/07/2020   Major depression in remission (HCC) 10/07/2020   Peripheral neuropathy 10/07/2020   Pure hypercholesterolemia 10/07/2020   Solitary pulmonary nodule 10/07/2020   Thoracic aortic aneurysm without rupture (HCC) 10/07/2020   Thrombophilia (HCC) 10/07/2020   Entropion of left lower eyelid 07/01/2014   High risk medication use 02/25/2014   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 is an 87 yo male who presented to the [facility] ED after a fall on 10/07/20. He was sitting in a chair and tripped while trying to stand up. He has an indwelling Foley and his wife  thinks he may have tripped over the tubing. He hit his head on the floor but denies loss of consciousness. On arrival to the ED his chief complaint was hip pain. Plain films showed an intertrochanteric fracture of the right femur. CT head/C spine showed a left subdural hematoma.   Assessment / Plan / Recommendation Clinical Impression  [name] presents with severe cognitive-linguistic impairment and mild dysarthria in setting of L subdural hematoma. Deficits were in the areas of memory, orientation, divergent naming, attention, speech articulation, and visual processing. Pt with baseline visual impairment. IADLs are handled by family at baseline, so suspect some level of baseline cognitive deficits. Language should be further evaluated, as nonsensical speech was observed; difficult to discern if this was related to cognition or language, given severity of cognitive impairment. See scores below.  [facility] Mental Status Examination: Orientation: 1/3 (when asked where we were he said "mixed field" but when asked the state he said "[place]" ?? Language impairment) Coding 5 words: WNL Calculations: 1/3 Recalling 5 words: 0/5 Divergent Naming: 0/3 Mental manipulation/attention: 1/2 Visual Processing: deferred given baseline impairments; unable to see anything on sheet  Paragraph Memory: 6/8  Total: 9/24 (usually out of 30, but -6 for deferred visual components) consistent with severe cognitive impairment    SLP Assessment  SLP Recommendation/Assessment: Patient needs continued Speech Lanaguage Pathology Services SLP Visit Diagnosis: Cognitive communication deficit (R41.841);Aphasia (R47.01)    Follow Up Recommendations  Inpatient Rehab    Frequency and Duration min 2x/week  1 week  SLP Evaluation Cognition  Overall Cognitive Status: Impaired/Different from baseline Arousal/Alertness: Awake/alert Orientation Level: Oriented to person Memory: Impaired Memory  Impairment: Retrieval deficit;Decreased short term memory Decreased Short Term Memory: Verbal basic;Functional basic Awareness: Impaired Awareness Impairment: Emergent impairment;Intellectual impairment Problem Solving: Impaired Problem Solving Impairment: Verbal basic;Functional basic Executive Function: Reasoning;Decision Making Reasoning: Impaired Reasoning Impairment: Verbal basic;Functional basic Decision Making: Impaired Decision Making Impairment: Verbal basic;Functional basic Safety/Judgment: Impaired       Comprehension  Auditory Comprehension Yes/No Questions: Impaired Complex Questions: 25-49% accurate Commands: Impaired One Step Basic Commands: 25-49% accurate Two Step Basic Commands: 0-24% accurate Conversation: Simple Interfering Components: Visual impairments;Hearing EffectiveTechniques: Increased volume;Slowed speech;Visual/Gestural cues Visual Recognition/Discrimination Discrimination: Exceptions to [facility] Reading Comprehension Reading Status: Not tested    Expression Expression Primary Mode of Expression: Verbal Verbal Expression Overall Verbal Expression: Impaired Initiation: No impairment Automatic Speech: Name;Social Response Level of Generative/Spontaneous Verbalization: Sentence Repetition: No impairment Naming: Impairment Responsive: 76-100% accurate Confrontation: Not tested Divergent: 0-24% accurate Verbal Errors: Confabulation;Language of confusion Pragmatics: No impairment   Oral / Motor  Oral Motor/Sensory Function Overall Oral Motor/Sensory Function: Within functional limits Motor Speech Overall Motor Speech: Appears within functional limits for tasks assessed Respiration: Within functional limits Phonation: Normal Resonance: Within functional limits Articulation: Impaired Level of Impairment: Phrase Intelligibility: Intelligible Motor Planning: Witnin functional limits            Zinia Innocent P. Elaina Cara, M.S., CCC-SLP Speech-Language  Pathologist Acute Rehabilitation Services Pager: [phone]            Susanne Borders Mickel Schreur 10/10/2020, 10:54 AM

## 2020-10-10 NOTE — Progress Notes (Signed)
2 Days Post-Op   Subjective/Chief Complaint: Pt with no acute changes   Objective: Vital signs in last 24 hours: Temp:  [98.1 F (36.7 C)-100.2 F (37.9 C)] 98.2 F (36.8 C) (06/11 0400) Pulse Rate:  [89-110] 110 (06/11 0800) Resp:  [13-25] 20 (06/11 0800) BP: (87-125)/(49-87) 110/61 (06/11 0800) SpO2:  [93 %-100 %] 100 % (06/11 0800) Last BM Date:  (PTA)  Intake/Output from previous day: 06/10 0701 - 06/11 0700 In: 923.5 [I.V.:219.5; Blood:253.3; IV Piggyback:450.6] Out: 1129 [Urine:1100; Drains:29] Intake/Output this shift: Total I/O In: 10 [I.V.:10] Out: -   Physical Exam:  General: no respiratory distress Neuro: alert and oriented to state and city, not to year, F/C HEENT/Neck: drain from burr hole Resp: clear to auscultation bilaterally CVS: IRR GI: soft, nontender, BS WNL, no r/g Extremities: calves soft  Lab Results:  Recent Labs    10/09/20 0957 10/10/20 0256  WBC 12.8* 12.9*  HGB 7.4* 7.0*  HCT 23.2* 21.8*  PLT 224 206   BMET Recent Labs    10/09/20 0259 10/10/20 0256  NA 138 137  K 4.2 4.0  CL 108 104  CO2 25 24  GLUCOSE 118* 120*  BUN 21 19  CREATININE 1.09 1.05  CALCIUM 8.0* 8.0*   PT/INR No results for input(s): LABPROT, INR in the last 72 hours. ABG No results for input(s): PHART, HCO3 in the last 72 hours.  Invalid input(s): PCO2, PO2  Studies/Results: DG C-Arm 1-60 Min  Result Date: 10/08/2020 CLINICAL DATA:  Right hip ORIF, intraoperative examination EXAM: RIGHT FEMUR 2 VIEWS; DG C-ARM 1-60 MIN COMPARISON:  None. FINDINGS: Four fluoroscopic intraoperative radiographs of the right hip demonstrate ORIF utilizing a long-stem gamma nail device. Fracture fragments are in anatomic alignment. Femoral head is seated within the right acetabulum. No unexpected fracture or dislocation on this limited examination. Fluoroscopy time time: 1 minutes 30 seconds Dose: 23.73 mGy Images: 4 IMPRESSION: Right hip ORIF.  No unexpected fracture or  dislocation. Electronically Signed   By: Helyn Numbers MD   On: 10/08/2020 21:05   DG FEMUR, MIN 2 VIEWS RIGHT  Result Date: 10/08/2020 CLINICAL DATA:  Right hip ORIF, intraoperative examination EXAM: RIGHT FEMUR 2 VIEWS; DG C-ARM 1-60 MIN COMPARISON:  None. FINDINGS: Four fluoroscopic intraoperative radiographs of the right hip demonstrate ORIF utilizing a long-stem gamma nail device. Fracture fragments are in anatomic alignment. Femoral head is seated within the right acetabulum. No unexpected fracture or dislocation on this limited examination. Fluoroscopy time time: 1 minutes 30 seconds Dose: 23.73 mGy Images: 4 IMPRESSION: Right hip ORIF.  No unexpected fracture or dislocation. Electronically Signed   By: Helyn Numbers MD   On: 10/08/2020 21:05    Anti-infectives: Anti-infectives (From admission, onward)    Start     Dose/Rate Route Frequency Ordered Stop   10/08/20 1400  ceFAZolin (ANCEF) IVPB 2g/100 mL premix        2 g 200 mL/hr over 30 Minutes Intravenous On call to O.R. 10/08/20 0955 10/08/20 1810       Assessment/Plan: Fall out of chair TBI/R SDH - S/P burr hole evac by Dr. Maurice Small 6/9, has drain in place, Keppra for 7d, exam improved some, plan TBI team therapies Anticoagulation with Xarelto - reversed with Kcentra, hold R hip FX - S/P ORIF by Dr. Roda Shutters 6/9, WBAT ABL anemia - post-op, recheck in AM Sun FEN - IVF, adv to FLD Chronic urinary retention - continue home foley, flomax VTE - PAS only, no LMWH until drain  out per Dr. Varney Daily - ICU   Critical Care Total Time*: 30 Minutes  LOS: 3 days    Axel Filler 10/10/2020

## 2020-10-10 NOTE — Progress Notes (Signed)
Overall doing well.  Patient wide-awake.  His speech is clear.  His content is reasonably appropriate.  His motor examination is 5/5 bilaterally.  There is no evidence of pronator drift.  Dressing is dry.  Drain output is about 80 cc overnight.  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  Plan head CT scan in morning.  If this looks good we will pull the drain.  Patient okay to be started on Lovenox.

## 2020-10-11 LAB — BASIC METABOLIC PANEL
Anion gap: 5 (ref 5–15)
BUN: 19 mg/dL (ref 8–23)
CO2: 26 mmol/L (ref 22–32)
Calcium: 8.1 mg/dL — ABNORMAL LOW (ref 8.9–10.3)
Chloride: 108 mmol/L (ref 98–111)
Creatinine, Ser: 0.9 mg/dL (ref 0.61–1.24)
GFR, Estimated: >60 mL/min (ref >60)
Glucose, Bld: 123 mg/dL — ABNORMAL HIGH (ref 70–99)
Potassium: 3.7 mmol/L (ref 3.5–5.1)
Sodium: 139 mmol/L (ref 135–145)

## 2020-10-11 LAB — CBC
HCT: 21.9 % — ABNORMAL LOW (ref 39.0–52.0)
Hemoglobin: 6.8 g/dL — CL (ref 13.0–17.0)
MCH: 29.4 pg (ref 26.0–34.0)
MCHC: 31.1 g/dL (ref 30.0–36.0)
MCV: 94.8 fL (ref 80.0–100.0)
Platelets: 236 10*3/uL (ref 150–400)
RBC: 2.31 MIL/uL — ABNORMAL LOW (ref 4.22–5.81)
RDW: 16.3 % — ABNORMAL HIGH (ref 11.5–15.5)
WBC: 11.7 10*3/uL — ABNORMAL HIGH (ref 4.0–10.5)
nRBC: 0 % (ref 0.0–0.2)

## 2020-10-11 LAB — HEMOGLOBIN AND HEMATOCRIT, BLOOD
HCT: 22.7 % — ABNORMAL LOW (ref 39.0–52.0)
Hemoglobin: 7.2 g/dL — ABNORMAL LOW (ref 13.0–17.0)

## 2020-10-11 LAB — PREPARE RBC (CROSSMATCH)

## 2020-10-11 MED ORDER — SODIUM CHLORIDE 0.9% IV SOLUTION: Freq: Once | INTRAVENOUS | Status: AC

## 2020-10-11 NOTE — Progress Notes (Signed)
3 Days Post-Op   Subjective/Chief Complaint: PT with no acute issues    Objective: Vital signs in last 24 hours: Temp:  [98.1 F (36.7 C)-98.8 F (37.1 C)] 98.4 F (36.9 C) (06/12 0415) Pulse Rate:  [90-117] 100 (06/12 0600) Resp:  [15-27] 20 (06/12 0600) BP: (93-128)/(58-91) 115/67 (06/12 0600) SpO2:  [89 %-100 %] 98 % (06/12 0600) Last BM Date:  (pta)  Intake/Output from previous day: 06/11 0701 - 06/12 0700 In: 830 [I.V.:480; Blood:250; IV Piggyback:100] Out: 686 [Urine:675; Drains:11] Intake/Output this shift: No intake/output data recorded.  Physical Exam:  General: no respiratory distress Neuro: alert and oriented wanes on and off HEENT/Neck: drain from burr hole Resp: clear to auscultation bilaterally CVS: IRR GI: soft, nontender, BS WNL, no r/g Extremities: calves soft   Lab Results:  Recent Labs    10/10/20 0256 10/11/20 0049 10/11/20 0632  WBC 12.9* 11.7*  --   HGB 7.0* 6.8* 7.2*  HCT 21.8* 21.9* 22.7*  PLT 206 236  --    BMET Recent Labs    10/10/20 0256 10/11/20 0049  NA 137 139  K 4.0 3.7  CL 104 108  CO2 24 26  GLUCOSE 120* 123*  BUN 19 19  CREATININE 1.05 0.90  CALCIUM 8.0* 8.1*   PT/INR No results for input(s): LABPROT, INR in the last 72 hours. ABG No results for input(s): PHART, HCO3 in the last 72 hours.  Invalid input(s): PCO2, PO2  Studies/Results: No results found.  Anti-infectives: Anti-infectives (From admission, onward)    Start     Dose/Rate Route Frequency Ordered Stop   10/08/20 1400  ceFAZolin (ANCEF) IVPB 2g/100 mL premix        2 g 200 mL/hr over 30 Minutes Intravenous On call to O.R. 10/08/20 0955 10/08/20 1810       Assessment/Plan: Fall out of chair TBI/R SDH - S/P burr hole evac by Dr. Maurice Small 6/9, has drain in place, Keppra for 7d, exam improved some, plan TBI team therapies, Drain out per NSR soon Anticoagulation with Xarelto - reversed with Kcentra, hold R hip FX - S/P ORIF by Dr. Roda Shutters 6/9,  WBAT ABL anemia - post-op, Trx 1U PRBC Sun AM FEN - IVF, adv to FLD Chronic urinary retention - continue home foley, flomax VTE - PAS only, OK for lovenox per Dr. Wynell Balloon - ICU   Critical Care Total Time*: 30 Minutes   LOS: 4 days    Axel Filler 10/11/2020

## 2020-10-11 NOTE — Progress Notes (Signed)
Overall doing well.  Denies headache.  Intermittently confused.  No weakness.  Speech fluent.  Patient afebrile.  Vital signs are stable.  Strength 5/5 bilaterally.  No pronator drift.  Wound clean and dry.  There is been some intermittent drainage around the subdural drain tube.  This was removed today.  Staples were applied.  Overall doing well.  Begin to mobilize when okay with orthopedics.

## 2020-10-11 NOTE — PMR Pre-admission (Signed)
PMR Admission Coordinator Pre-Admission Assessment  Patient: Nathan Mitchell is an 87 y.o., male MRN: [id number] DOB: 07-Mar-1931 Height: 6' 1" (185.4 cm) Weight: 78.1 kg  Insurance Information HMO:     PPO: yes     PCP:      IPA:      80/20:      OTHER:  PRIMARY: [employer]      Policy#:[id number]    Subscriber: Pt CM Name: Jeanella Craze      Phone#: [phone]   Fax#: [phone]  Pre-Cert#:[id number] Phone #: [phone]      Name: Charm Barges is CM. I received authorization from Dawson Bills with [facility] Medicare on 6/14 for admission on 6/14 for 5 days with updates due 10/18/20. Eff Date: 05/03/2019- still active Deductible: does not have OOP Max: $4,000 ($498.71 met) CIR: $160/day co-pay with a max co-pay of $1,600/admission (10 days) SNF: 100% coverage Outpatient:  $20/visit co-pay Home Health:  100% coverage DME: 80% coverage; 20% co-insurance  SECONDARY: none      Policy#:      Phone#:    Development worker, community:       Phone#:   The Therapist, art Information Summary" for patients in Inpatient Rehabilitation Facilities with attached "Privacy Act [place] Records" was provided and verbally reviewed with: Patient  Emergency Contact Information Contact Information     Name Relation Home Work Mobile   Robison,Katherine [place]) [place] [phone]  [phone]   Jens Som Daughter   [phone]   Alphia Kava Daughter   [phone]   Vickii Penna Daughter   [phone]      Current Medical History  Patient Admitting Diagnosis: L SDH and R hip fx  HPI: This is a 87 y.o. man that presented to ED 10/08/2020 after a mechanical fall. He has a history of GCA w/ resulting blindness, has an indwelling foley that his wife suspects he tripped over. Since the fall, he has been worse than his cognitive baseline, wife describes him as 'goofy' as well as severe right hip pain. He denies any headaches / N / V, he was  on rivaroxaban for PAF, which was reversed w/ [facility] prior to transfer. Imaging revealed R hip fx and L SDH.  s/p IM nail of R femur and burr hole placement on 6/9. CIR was consulted to assist in return to PLOF.  Patient's medical record from [facility] has been reviewed by the rehabilitation admission coordinator and physician.  Past Medical History  Past Medical History:  Diagnosis Date   Aortic atherosclerosis (HCC)    Atrial fibrillation ([place])    Giant cell arteritis ([place])    Osteopenia    Pure hypercholesterolemia    Thoracic aortic aneurysm ([place])    Thrombophilia ([place])     Family History   family history is not on file.  Prior Rehab/Hospitalizations Has the patient had prior rehab or hospitalizations prior to admission? Yes  Has the patient had major surgery during 100 days prior to admission? Yes   Current Medications  Current Facility-Administered Medications:    0.9 %  sodium chloride infusion, , Intravenous, Continuous, Georganna Skeans, MD, Last Rate: 10 mL/hr at 10/11/20 0600, Infusion Verify at 10/11/20 0600   acetaminophen (TYLENOL) tablet 650 mg, 650 mg, Oral, Q6H PRN, 650 mg at 10/08/20 0540 **OR** acetaminophen (TYLENOL) suppository 650 mg, 650 mg, Rectal, Q6H PRN, Leandrew Koyanagi, MD   atorvastatin (LIPITOR) tablet 10 mg, 10 mg, Oral, Daily, Xu, Naiping M,  MD, 10 mg at 10/11/20 1114   Chlorhexidine Gluconate Cloth 2 % PADS 6 each, 6 each, Topical, Daily, Leandrew Koyanagi, MD, 6 each at 10/10/20 0907   enoxaparin (LOVENOX) injection 40 mg, 40 mg, Subcutaneous, Daily, Ralene Ok, MD, 40 mg at 10/11/20 1120   escitalopram (LEXAPRO) tablet 20 mg, 20 mg, Oral, Daily, Leandrew Koyanagi, MD, 20 mg at 53/61/44 3154   folic acid (FOLVITE) tablet 0.5 mg, 500 mcg, Oral, Daily, Erlinda Hong, Naiping M, MD, 0.5 mg at 10/11/20 1114   HYDROmorphone (DILAUDID) injection 0.5 mg, 0.5 mg, Intravenous, Q4H PRN, Leandrew Koyanagi, MD, 0.5 mg at 10/09/20 2347   levETIRAcetam (KEPPRA) IVPB  500 mg/100 mL premix, 500 mg, Intravenous, Q12H, Leandrew Koyanagi, MD, Last Rate: 400 mL/hr at 10/11/20 1121, 500 mg at 10/11/20 1121   MEDLINE mouth rinse, 15 mL, Mouth Rinse, BID, Georganna Skeans, MD, 15 mL at 10/10/20 2117   methocarbamol (ROBAXIN) tablet 500 mg, 500 mg, Oral, Q8H PRN, Leandrew Koyanagi, MD, 500 mg at 10/11/20 1113   ondansetron (ZOFRAN) tablet 4 mg, 4 mg, Oral, Q6H PRN **OR** ondansetron (ZOFRAN) injection 4 mg, 4 mg, Intravenous, Q6H PRN, Leandrew Koyanagi, MD, 4 mg at 10/09/20 0086   oxyCODONE (Oxy IR/ROXICODONE) immediate release tablet 5 mg, 5 mg, Oral, Q4H PRN, Leandrew Koyanagi, MD, 5 mg at 10/11/20 0248   pantoprazole (PROTONIX) EC tablet 40 mg, 40 mg, Oral, Daily, Leandrew Koyanagi, MD, 40 mg at 10/11/20 1114   polyethylene glycol (MIRALAX / GLYCOLAX) packet 17 g, 17 g, Oral, Daily PRN, Leandrew Koyanagi, MD, 17 g at 10/10/20 0932   tamsulosin (FLOMAX) capsule 0.8 mg, 0.8 mg, Oral, QPC supper, Erlinda Hong, Naiping M, MD, 0.8 mg at 10/10/20 1718  Patients Current Diet:  Diet Order             Diet full liquid Room service appropriate? Yes; Fluid consistency: Thin  Diet effective now                   Precautions / Restrictions Precautions Precautions: Fall Precaution Comments: ventric (drain to gravity, does not need to be clamped), foley, bil mittens, blind L eye ~20% R eye, wound vac R hip Restrictions Weight Bearing Restrictions: Yes RLE Weight Bearing: Weight bearing as tolerated   Has the patient had 2 or more falls or a fall with injury in the past year? Yes  Prior Activity Level Limited Community (1-2x/wk): Pt. went out for appointments  Prior Functional Level Self Care: Did the patient need help bathing, dressing, using the toilet or eating? Needed some help  Indoor Mobility: Did the patient need assistance with walking from room to room (with or without device)? Independent  Stairs: Did the patient need assistance with internal or external stairs (with or without device)?  Needed some help  Functional Cognition: Did the patient need help planning regular tasks such as shopping or remembering to take medications? Dependent  Home Assistive Devices / Equipment Home Assistive Devices/Equipment: Other (Comment) (foley catheter per pt's wife) Home Equipment: Cane - single point, Shower seat, Grab bars - tub/shower, Grab bars - toilet  Prior Device Use: Indicate devices/aids used by the patient prior to current illness, exacerbation or injury? None of the above  Current Functional Level Cognition  Arousal/Alertness: Awake/alert Overall Cognitive Status: Impaired/Different from baseline Orientation Level: Oriented to person, Oriented to place, Oriented to situation, Disoriented to time Following Commands: Follows multi-step commands inconsistently, Follows multi-step commands with increased time  Safety/Judgement: Decreased awareness of safety, Decreased awareness of deficits General Comments: RN notified PT that 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 Pt needing multi-modal cues and increased time to process as pt displays some anxiety with mobility. Pt able to initiate tasks but becomes resistive when he experiences pain or anxiety with mobility. Pt with decreased deficits and safety awareness, reaching for a pole on wheels for stability despite max cues to hold onto more steady therapist. Pt became agitated with increased mobility thus ended session per pt request. Memory: Impaired Memory Impairment: Retrieval deficit, Decreased short term memory Decreased Short Term Memory: Verbal basic, Functional basic Awareness: Impaired Awareness Impairment: Emergent impairment, Intellectual impairment Problem Solving: Impaired Problem Solving Impairment: Verbal basic, Functional basic Executive Function: Reasoning, Decision Making Reasoning: Impaired Reasoning Impairment: Verbal basic, Functional basic Decision Making: Impaired Decision Making Impairment: Verbal basic,  Functional basic Safety/Judgment: Impaired    Extremity Assessment (includes Sensation/Coordination)  Upper Extremity Assessment: Defer to OT evaluation  Lower Extremity Assessment: RLE deficits/detail, LLE deficits/detail RLE Deficits / Details: limited by significant pain in thigh/hip from fx. able to activate quad in gravity minimized position, full ROM at ankle. hx of neuropathy in bilateral LE RLE: Unable to fully assess due to pain RLE Sensation: history of peripheral neuropathy LLE Deficits / Details: history of neruopathy to mid-calf bilaterally, able to complete full ROM at ankle, knee, and hip against gravity LLE Sensation: history of peripheral neuropathy LLE Coordination: decreased fine motor, decreased gross motor    ADLs  Overall ADL's : Needs assistance/impaired Eating/Feeding: Minimal assistance, Bed level Lower Body Bathing: Total assistance Lower Body Dressing: Total assistance Toilet Transfer: +2 for physical assistance, Maximal assistance General ADL Comments: pt with short cord for the EVD so exiting the bed in egress used to help achieve standing.    Mobility  Overal bed mobility: Needs Assistance General bed mobility comments: use of bed egress to chair to complete bed mobility due to limited length of ventricular tubing and pt pain levels. modA to pull trunk forward initially, pt able to assist some with use of bed rails    Transfers  Overall transfer level: Needs assistance Equipment used: 2 person hand held assist Transfers: Sit to/from Stand Sit to Stand: +2 physical assistance, Mod assist General transfer comment: Pt pushing up from rails first rep and then holding onto arms of therapist and tech for second rep from egress bed position, modAx2 to power up and steady with bil knee block.    Ambulation / Gait / Stairs / Wheelchair Mobility  Ambulation/Gait Ambulation/Gait assistance: Mod assist, Max assist, +2 physical assistance Assistive device: 2  person hand held assist General Gait Details: Pre-gait training, cuing pt to shift weight laterally between feet first standing bout modAx2 and then attempt to march in place 2nd bout maxAx2 as pt's knees would buckle and needed blocking. Pt then became agitated and therefore ended session per pt request.    Posture / Balance Dynamic Sitting Balance Sitting balance - Comments: Bil UEs on rails of bed to sit in egress position. Balance Overall balance assessment: Needs assistance Sitting-balance support: Bilateral upper extremity supported Sitting balance-Leahy Scale: Poor Sitting balance - Comments: Bil UEs on rails of bed to sit in egress position. Standing balance support: Bilateral upper extremity supported, During functional activity Standing balance-Leahy Scale: Poor Standing balance comment: depending on UE support and therapist    Special needs/care consideration Skin: surgical incision to head, abrasion to R hip and groin  and Special service needs: Blind at baseline   Previous Home Environment (from acute therapy documentation) Living Arrangements: Spouse/significant other  Lives With: Spouse Available Help at Discharge: Family, Available 24 hours/day Type of Home: House Home Layout: Two level, Able to live on main level with bedroom/bathroom Home Access: Level entry Bathroom Shower/Tub: Multimedia programmer: [place]: Yes Type of Home Care Services: [place] (if known): private Additional Comments: has 4 children in addition to wife (A)  Discharge Living Setting Plans for Discharge Living Setting: Patient's home Type of Home at Discharge: House Discharge Home Layout: Two level, Able to live on main level with bedroom/bathroom Discharge Home Access: Level entry Discharge Bathroom Shower/Tub: Walk-in shower Discharge Bathroom Toilet: Standard Discharge Bathroom Accessibility: Yes How Accessible: Accessible via  wheelchair, Accessible via walker Does the patient have any problems obtaining your medications?: No  Social/Family/Support Systems Patient Roles: Spouse Contact Information: [phone] Anticipated Caregiver: Neal Dy Anticipated Caregiver's Contact Information: [phone] Ability/Limitations of Caregiver: Can provide supervision-min A, has3 daughters who can assist and an hire help Caregiver Availability: 24/7 Discharge Plan Discussed with Primary Caregiver: Yes Is Caregiver In Agreement with Plan?: Yes Does Caregiver/Family have Issues with Lodging/Transportation while Pt is in Rehab?: No  Goals Patient/Family Goal for Rehab: PT/OT/SLP mod A Expected length of stay: 24-27 days Pt/Family Agrees to Admission and willing to participate: Yes Program Orientation Provided & Reviewed with Pt/Caregiver Including Roles  & Responsibilities: Yes  Decrease burden of Care through IP rehab admission: Specialzed equipment needs, Diet advancement, Decrease number of caregivers, Bowel and bladder program, and Patient/family education  Possible need for SNF placement upon discharge: not anticipated  Patient Condition: I have reviewed medical records from [facility], spoken with CM, and patient and spouse. I met with patient at the bedside for inpatient rehabilitation assessment.  Patient will benefit from ongoing PT, OT, and SLP, can actively participate in 3 hours of therapy a day 5 days of the week, and can make measurable gains during the admission.  Patient will also benefit from the coordinated team approach during an Inpatient Acute Rehabilitation admission.  The patient will receive intensive therapy as well as Rehabilitation physician, nursing, social worker, and care management interventions.  Due to bladder management, bowel management, safety, skin/wound care, disease management, medication administration, pain management, and patient education the patient requires  24 hour a day rehabilitation nursing.  The patient is currently mod with mobility and basic ADLs.  Discharge setting and therapy post discharge at home with home health is anticipated.  Patient has agreed to participate in the Acute Inpatient Rehabilitation Program and will admit today.  Preadmission Screen Completed By:  Genella Mech, 10/11/2020 12:27 PM ______________________________________________________________________   Discussed status with Dr. Ranell Patrick on 10/13/2020  at 8 and received approval for admission today.  Admission Coordinator:  Genella Mech, CCC-SLP, time 270 /Date 10/13/2020.   Assessment/Plan: Diagnosis: Closed right hip fracture Does the need for close, 24 hr/day Medical supervision in concert with the patient's rehab needs make it unreasonable for this patient to be served in a less intensive setting? Yes Co-Morbidities requiring supervision/potential complications: Subdural hematoma, acute pain due to trauma, abnormal gait, aortic regurgitation, aortic root dilation Due to bladder management, bowel management, safety, skin/wound care, disease management, medication administration, pain management, and patient education, does the patient require 24 hr/day rehab nursing? Yes Does the patient require coordinated care of a physician, rehab nurse, PT, OT, and SLP to  address physical and functional deficits in the context of the above medical diagnosis(es)? Yes Addressing deficits in the following areas: balance, endurance, locomotion, strength, transferring, bowel/bladder control, bathing, dressing, feeding, grooming, toileting, cognition, and psychosocial support Can the patient actively participate in an intensive therapy program of at least 3 hrs of therapy 5 days a week? Yes The potential for patient to make measurable gains while on inpatient rehab is excellent Anticipated functional outcomes upon discharge from inpatient rehab: min assist PT, min assist OT, supervision  SLP Estimated rehab length of stay to reach the above functional goals is: 2-3 weeks Anticipated discharge destination: Home 10. Overall Rehab/Functional Prognosis: good   MD Signature: Leeroy Cha, MD

## 2020-10-11 NOTE — Progress Notes (Signed)
Patient is stable in the ICU.  No events.  Continues to be a little confused.  The surgical bandages are all clean dry and intact.  Incisional VAC is intact.  Acute blood loss anemia transfuse as indicated.  Mobilize with physical therapy when able.  Anticipate will need discharge to SNF or CIR.  Stable from orthopedic standpoint.

## 2020-10-11 NOTE — Progress Notes (Signed)
Date and time results received: 10/11/20 2:42 AM   Test: Hgb  Critical Value: 6.8  Name of Provider Notified: Dr Dwain Sarna  Orders Received? Or Actions Taken?:  Give one unit of blood

## 2020-10-12 ENCOUNTER — Inpatient Hospital Stay (HOSPITAL_COMMUNITY): Payer: Medicare PPO

## 2020-10-12 ENCOUNTER — Encounter (HOSPITAL_COMMUNITY): Payer: Self-pay | Admitting: Orthopaedic Surgery

## 2020-10-12 LAB — BASIC METABOLIC PANEL
Anion gap: 7 (ref 5–15)
BUN: 21 mg/dL (ref 8–23)
CO2: 24 mmol/L (ref 22–32)
Calcium: 8 mg/dL — ABNORMAL LOW (ref 8.9–10.3)
Chloride: 107 mmol/L (ref 98–111)
Creatinine, Ser: 0.93 mg/dL (ref 0.61–1.24)
GFR, Estimated: >60 mL/min (ref >60)
Glucose, Bld: 116 mg/dL — ABNORMAL HIGH (ref 70–99)
Potassium: 3.8 mmol/L (ref 3.5–5.1)
Sodium: 138 mmol/L (ref 135–145)

## 2020-10-12 LAB — BPAM RBC
Blood Product Expiration Date: 202206152359
Blood Product Expiration Date: 202206292359
ISSUE DATE / TIME: 202206100541
ISSUE DATE / TIME: 202206120251
Unit Type and Rh: 600
Unit Type and Rh: 600

## 2020-10-12 LAB — CBC
HCT: 22.6 % — ABNORMAL LOW (ref 39.0–52.0)
Hemoglobin: 7.3 g/dL — ABNORMAL LOW (ref 13.0–17.0)
MCH: 29.8 pg (ref 26.0–34.0)
MCHC: 32.3 g/dL (ref 30.0–36.0)
MCV: 92.2 fL (ref 80.0–100.0)
Platelets: 241 10*3/uL (ref 150–400)
RBC: 2.45 MIL/uL — ABNORMAL LOW (ref 4.22–5.81)
RDW: 15.9 % — ABNORMAL HIGH (ref 11.5–15.5)
WBC: 9.9 10*3/uL (ref 4.0–10.5)
nRBC: 0 % (ref 0.0–0.2)

## 2020-10-12 LAB — TYPE AND SCREEN
ABO/RH(D): A NEG
Antibody Screen: NEGATIVE
Unit division: 0
Unit division: 0

## 2020-10-12 MED ORDER — ENOXAPARIN SODIUM 30 MG/0.3ML IJ SOSY
30.0000 mg | PREFILLED_SYRINGE | Freq: Two times a day (BID) | INTRAMUSCULAR | Status: DC
  Administered 2020-10-12 – 2020-10-13 (×3): 30 mg via SUBCUTANEOUS
  Filled 2020-10-12 (×3): qty 0.3

## 2020-10-12 NOTE — Progress Notes (Signed)
Occupational Therapy Treatment Patient Details Name: Nathan Mitchell MRN: [id number] DOB: 05/08/1930 Today's Date: 10/12/2020    History of present illness 87 yo male presenting 6/8 after sustaining a fall at home (suspect the pt tripped over foley tubing) with c/o R hip pain. Imaging revealed R hip fx and L SDH.  s/p IM nail of R femur and burr hole placement on 6/9. PMH includes: afib, giant cell arteritis osteopenia thoracic aortic aneurysm former smoker   OT comments  Pt progressed with therapy to static standing in RW and stedy this session with c/o back pain through out session. Pt with extensive bruising and hip surgery on R side where pt expressed most discomfort. Pt needs hand over hand for hand placement due to visual deficits this session. Pt with wife and daughter present during session. Wife educated that CIR requires a sequence of approvals prior to admission. Wife expressed pt has been accepted to CIR and asking if he will go by Wednesday and concerns for urine out put. Wife encouraged to talk with CIR coordinators regarding acceptance, bed availability and follow up needs will be addressed by transitional care either in acute/ inpatient. Recommendation for CIR.    Follow Up Recommendations  CIR    Equipment Recommendations  3 in 1 bedside commode;Wheelchair (measurements OT);Wheelchair cushion (measurements OT);Hospital bed    Recommendations for Other Services Rehab consult    Precautions / Restrictions Precautions Precautions: Fall Precaution Comments: foley Restrictions Weight Bearing Restrictions: Yes RLE Weight Bearing: Weight bearing as tolerated LLE Weight Bearing: Weight bearing as tolerated       Mobility Bed Mobility Overal bed mobility: Needs Assistance Bed Mobility: Rolling;Supine to Sit;Sit to Supine Rolling: Mod assist   Supine to sit: +2 for physical assistance;Max assist;HOB elevated Sit to supine: Max assist;+2 for physical assistance    General bed mobility comments: attempting to exit the bed with egress and bed does not have the ability to progress to floor completely. Pt then helicoptered to EOB L side with HOB elevated and pulling at bed rail on L side. pt static sitting min to max (A). pt returning to bed with initiation of head to pillow and total (A) for bil LE onto bed surface. pt with (A) to control rolling onto back. pt total+2 total (A) to scoot to [facility]    Transfers                      Balance Overall balance assessment: Needs assistance Sitting-balance support: Bilateral upper extremity supported;Feet supported Sitting balance-Leahy Scale: Poor Sitting balance - Comments: posterior bias and shifting off R hip   Standing balance support: Bilateral upper extremity supported;During functional activity Standing balance-Leahy Scale: Poor Standing balance comment: reliant on therapist and BIL UE on RW or stedy                           ADL either performed or assessed with clinical judgement   ADL Overall ADL's : Needs assistance/impaired                         Toilet Transfer: +2 for physical assistance;Maximal assistance Toilet Transfer Details (indicate cue type and reason): elevated surface to achieve standing in RW then stedy. pt needs vcs for visual attention           General ADL Comments: pt progressed to static standing >1 minute in RW this session. pt  requires stedy to continue to progress oob. pt expressed fatigue so allowed to return to bed to sleep     Vision   Additional Comments: sees out R eye only 20% per wife. pt reports seeing gray and white and shadows. pt only able to make out a outline of therapist but no featuers   Perception     Praxis      Cognition Arousal/Alertness: Awake/alert Behavior During Therapy: Flat affect Overall Cognitive Status: Impaired/Different from baseline Area of Impairment: Safety/judgement                        Following Commands: Follows one step commands inconsistently;Follows one step commands with increased time Safety/Judgement: Decreased awareness of safety;Decreased awareness of deficits Awareness: Emergent Problem Solving: Slow processing;Difficulty sequencing General Comments: Pt requesting to remain in bed and needed redirection to progress to OOB attempts. pt shows decrease awareness to R hip this session and very fatigued        Exercises     Shoulder Instructions       General Comments VSS montiored BP throughout session without changes    Pertinent Vitals/ Pain       Pain Assessment: Faces Faces Pain Scale: Hurts whole lot Pain Location: back Pain Descriptors / Indicators: Discomfort;Grimacing;Guarding;Moaning Pain Intervention(s): Monitored during session;Premedicated before session;Repositioned  Home Living                                          Prior Functioning/Environment              Frequency  Min 2X/week        Progress Toward Goals  OT Goals(current goals can now be found in the care plan section)  Progress towards OT goals: Progressing toward goals  Acute Rehab OT Goals Patient Stated Goal: wife wants him to get as much therapy as possible OT Goal Formulation: With patient/family Time For Goal Achievement: 10/23/20 Potential to Achieve Goals: Good ADL Goals Pt Will Transfer to Toilet: with +2 assist;with mod assist;stand pivot transfer;bedside commode Additional ADL Goal #1: pt will complete bed mobility total +2 mod (A) as precursor to adls Additional ADL Goal #2: pt will complete 2 step command 50% of session  Plan Discharge plan remains appropriate    Co-evaluation    PT/OT/SLP Co-Evaluation/Treatment: Yes Reason for Co-Treatment: Necessary to address cognition/behavior during functional activity;For patient/therapist safety;To address functional/ADL transfers   OT goals addressed during session: ADL's and  self-care;Proper use of Adaptive equipment and DME;Strengthening/ROM      AM-PAC OT "6 Clicks" Daily Activity     Outcome Measure   Help from another person eating meals?: A Little Help from another person taking care of personal grooming?: A Little Help from another person toileting, which includes using toliet, bedpan, or urinal?: A Lot Help from another person bathing (including washing, rinsing, drying)?: A Lot Help from another person to put on and taking off regular upper body clothing?: A Lot Help from another person to put on and taking off regular lower body clothing?: A Lot 6 Click Score: 14    End of Session Equipment Utilized During Treatment: Gait belt;Rolling walker  OT Visit Diagnosis: Unsteadiness on feet (R26.81);Muscle weakness (generalized) (M62.81);Pain Pain - Right/Left: Right Pain - part of body: Hip   Activity Tolerance Patient tolerated treatment well   Patient Left in bed;with call bell/phone  within reach;with bed alarm set;with family/visitor present;with SCD's reapplied   Nurse Communication Mobility status;Precautions;Weight bearing status        Time: 0160-1093 OT Time Calculation (min): 38 min  Charges: OT General Charges $OT Visit: 1 Visit OT Treatments $Self Care/Home Management : 23-37 mins   Brynn, OTR/L  Acute Rehabilitation Services Pager: [phone] Office: [phone] .    Mateo Flow 10/12/2020, 1:14 PM

## 2020-10-12 NOTE — Progress Notes (Signed)
   Trauma/Critical Care Follow Up Note  Subjective:    Overnight Issues:   Objective:  Vital signs for last 24 hours: Temp:  [98.2 F (36.8 C)-99 F (37.2 C)] 98.6 F (37 C) (06/13 0800) Pulse Rate:  [86-187] 187 (06/13 0800) Resp:  [14-31] 21 (06/13 0800) BP: (81-143)/(49-121) 110/85 (06/13 0800) SpO2:  [78 %-100 %] 78 % (06/13 0800)  Hemodynamic parameters for last 24 hours:    Intake/Output from previous day: 06/12 0701 - 06/13 0700 In: 830.4 [P.O.:480; I.V.:250; IV Piggyback:100.4] Out: 495 [Urine:493; Drains:2]  Intake/Output this shift: Total I/O In: 10 [I.V.:10] Out: -   Vent settings for last 24 hours:    Physical Exam:  Gen: comfortable, no distress Neuro: non-focal exam, but confused HEENT: PERRL Neck: supple CV: RRR Pulm: unlabored breathing Abd: soft, NT GU: clear yellow urine, chronic foley Extr: wwp, no edema   Results for orders placed or performed during the hospital encounter of 10/07/20 (from the past 24 hour(s))  CBC     Status: Abnormal   Collection Time: 10/12/20 12:54 AM  Result Value Ref Range   WBC 9.9 4.0 - 10.5 K/uL   RBC 2.45 (L) 4.22 - 5.81 MIL/uL   Hemoglobin 7.3 (L) 13.0 - 17.0 g/dL   HCT 16.0 (L) 73.7 - 10.6 %   MCV 92.2 80.0 - 100.0 fL   MCH 29.8 26.0 - 34.0 pg   MCHC 32.3 30.0 - 36.0 g/dL   RDW 26.9 (H) 48.5 - 46.2 %   Platelets 241 150 - 400 K/uL   nRBC 0.0 0.0 - 0.2 %  Basic metabolic panel     Status: Abnormal   Collection Time: 10/12/20 12:54 AM  Result Value Ref Range   Sodium 138 135 - 145 mmol/L   Potassium 3.8 3.5 - 5.1 mmol/L   Chloride 107 98 - 111 mmol/L   CO2 24 22 - 32 mmol/L   Glucose, Bld 116 (H) 70 - 99 mg/dL   BUN 21 8 - 23 mg/dL   Creatinine, Ser 7.03 0.61 - 1.24 mg/dL   Calcium 8.0 (L) 8.9 - 10.3 mg/dL   GFR, Estimated >50 >09 mL/min   Anion gap 7 5 - 15    Assessment & Plan:  Present on Admission:  Closed right hip fracture (HCC)  Subdural hematoma (HCC)    LOS: 5 days   Additional  comments:I reviewed the patient's new clinical lab test results.   and I reviewed the patients new imaging test results.    Fall out of chair  TBI/R SDH - S/P burr hole evac by Dr. Maurice Small 6/9, drain removed, Keppra for 7d, exam improved some, TBI therapies Anticoagulation with Xarelto for AF - reversed with Kcentra, hold. Recommend o/p f/u with cardiology re: permanent d/c. R hip FX - S/P ORIF by Dr. Roda Shutters 6/9, WBAT ABL anemia - post-op, 1u pRBC 6/12, inappropriate response, but no sources of bleeding and asymptomatic, cont to monitor FEN - soft Chronic urinary retention - continue home foley, flomax VTE - PAS, LMWH Dispo - 4NP  Diamantina Monks, MD Trauma & General Surgery Please use [email or web address] to contact on call provider  10/12/2020  *Care during the described time interval was provided by me. I have reviewed this patient's available data, including medical history, events of note, physical examination and test results as part of my evaluation.

## 2020-10-12 NOTE — Progress Notes (Signed)
Inpatient Rehab Admissions Coordinator:    Per trauma MD, Pt. Medically ready for CIR. I await updated therapy notes so that I can open a case with CIR. PT/OT aware.   Megan Salon, MS, CCC-SLP Rehab Admissions Coordinator  [phone] (celll) [phone] (office)

## 2020-10-12 NOTE — Progress Notes (Signed)
Notified of back pain with PT earlier today. Patient re-examined. Stable bruising to R hip, point tenderness of mid-back. Patient states this pain is not new, daughter at bedside states it is. Given his confusion and TBI, will proceed with CT T/L spine.   Nathan Monks, MD General and Trauma Surgery [facility] Surgery

## 2020-10-12 NOTE — Progress Notes (Signed)
Physical Therapy Treatment Patient Details Name: Nathan Mitchell MRN: [id number] DOB: Mar 02, 1931 Today's Date: 10/12/2020    History of Present Illness 87 yo male presenting 6/8 after sustaining a fall at home (suspect the pt tripped over foley tubing) with c/o R hip pain. Imaging revealed R hip fx and L SDH.  s/p IM nail of R femur and burr hole placement on 6/9. PMH includes: afib, giant cell arteritis osteopenia thoracic aortic aneurysm former smoker    PT Comments    The pt was seen by PT/OT in conjunction to improve safety with progression of OOB mobility and tolerance. The pt was able to come to sitting without use of chair egress this session, but required max-totalA of 2 to complete helicopter transfer, and then maxA of 2 to stand at EOB and maintain static standing due to strong L lateral lean with to avoid WB on RLE. The pt was unable to manage steps this session, but was able to complete multiple sit-stand attempts with max encouragement and various equipment.  Continue to recommend CIR level therapies.   Follow Up Recommendations  CIR     Equipment Recommendations   (defer to post acute)    Recommendations for Other Services       Precautions / Restrictions Precautions Precautions: Fall Precaution Comments: foley Restrictions Weight Bearing Restrictions: Yes RLE Weight Bearing: Weight bearing as tolerated LLE Weight Bearing: Weight bearing as tolerated    Mobility  Bed Mobility Overal bed mobility: Needs Assistance Bed Mobility: Rolling;Supine to Sit;Sit to Supine Rolling: Mod assist   Supine to sit: +2 for physical assistance;Max assist;HOB elevated Sit to supine: Max assist;+2 for physical assistance   General bed mobility comments: attempting to exit the bed with egress and bed does not have the ability to progress to floor completely. Pt then helicoptered to EOB L side with HOB elevated and pulling at bed rail on L side. pt static sitting min to max (A).  pt returning to bed with initiation of head to pillow and total (A) for bil LE onto bed surface. pt with (A) to control rolling onto back. pt total+2 total (A) to scoot to [facility]    Transfers Overall transfer level: Needs assistance Equipment used: Rolling walker (2 wheeled);Ambulation equipment used Transfers: Sit to/from Stand Sit to Stand: +2 physical assistance;Mod assist;Max assist         General transfer comment: pt powering up to stand with mod/maxA of 2 with use of RW. maxA in standing due to strong L lateral lean/pushing. pt able to tolerate for 30 sec. modA to power up to steady         Balance Overall balance assessment: Needs assistance Sitting-balance support: Bilateral upper extremity supported;Feet supported Sitting balance-Leahy Scale: Poor Sitting balance - Comments: posterior bias and shifting off R hip   Standing balance support: Bilateral upper extremity supported;During functional activity Standing balance-Leahy Scale: Poor Standing balance comment: reliant on therapist and BIL UE on RW or stedy                            Cognition Arousal/Alertness: Lethargic;Awake/alert Behavior During Therapy: Flat affect Overall Cognitive Status: Impaired/Different from baseline Area of Impairment: Safety/judgement;Following commands;Problem solving                       Following Commands: Follows one step commands inconsistently;Follows one step commands with increased time Safety/Judgement: Decreased awareness of safety;Decreased awareness of  deficits Awareness: Emergent Problem Solving: Slow processing;Difficulty sequencing General Comments: Pt requesting to remain in bed and needed redirection to progress to OOB attempts. pt shows decrease awareness to R hip this session and very fatigued      Exercises      General Comments General comments (skin integrity, edema, etc.): VSS through session, pt with more eyes closed this session, but able  to follow commands and voice desires. BP remained stable      Pertinent Vitals/Pain Pain Assessment: Faces Faces Pain Scale: Hurts whole lot Pain Location: back Pain Descriptors / Indicators: Discomfort;Grimacing;Guarding;Moaning Pain Intervention(s): Limited activity within patient's tolerance;Monitored during session;Repositioned     PT Goals (current goals can now be found in the care plan section) Acute Rehab PT Goals Patient Stated Goal: wife wants him to get as much therapy as possible PT Goal Formulation: With patient Time For Goal Achievement: 10/23/20 Potential to Achieve Goals: Good Progress towards PT goals: Progressing toward goals    Frequency    Min 4X/week      PT Plan Current plan remains appropriate    Co-evaluation PT/OT/SLP Co-Evaluation/Treatment: Yes Reason for Co-Treatment: Complexity of the patient's impairments (multi-system involvement);Necessary to address cognition/behavior during functional activity;For patient/therapist safety;To address functional/ADL transfers PT goals addressed during session: Mobility/safety with mobility;Balance;Strengthening/ROM OT goals addressed during session: ADL's and self-care;Proper use of Adaptive equipment and DME;Strengthening/ROM      AM-PAC PT "6 Clicks" Mobility   Outcome Measure  Help needed turning from your back to your side while in a flat bed without using bedrails?: Total Help needed moving from lying on your back to sitting on the side of a flat bed without using bedrails?: A Lot Help needed moving to and from a bed to a chair (including a wheelchair)?: A Lot Help needed standing up from a chair using your arms (e.g., wheelchair or bedside chair)?: A Lot Help needed to walk in hospital room?: Total Help needed climbing 3-5 steps with a railing? : Total 6 Click Score: 9    End of Session Equipment Utilized During Treatment: Gait belt Activity Tolerance: Patient limited by pain;Patient limited by  fatigue Patient left: in bed;with call bell/phone within reach;with bed alarm set;with family/visitor present (chair position) Nurse Communication: Mobility status;Other (comment) PT Visit Diagnosis: Unsteadiness on feet (R26.81);Other abnormalities of gait and mobility (R26.89);Pain;Muscle weakness (generalized) (M62.81);Difficulty in walking, not elsewhere classified (R26.2) Pain - Right/Left: Right Pain - part of body: Hip (and back)     Time: 6195-0932 PT Time Calculation (min) (ACUTE ONLY): 39 min  Charges:  $Therapeutic Activity: 8-22 mins                     Rolm Baptise, PT, DPT   Acute Rehabilitation Department Pager #: [phone]   Gaetana Michaelis 10/12/2020, 1:32 PM

## 2020-10-13 ENCOUNTER — Other Ambulatory Visit: Payer: Self-pay

## 2020-10-13 ENCOUNTER — Encounter (HOSPITAL_COMMUNITY): Payer: Self-pay | Admitting: Physical Medicine & Rehabilitation

## 2020-10-13 ENCOUNTER — Inpatient Hospital Stay (HOSPITAL_COMMUNITY): Payer: Medicare PPO

## 2020-10-13 ENCOUNTER — Inpatient Hospital Stay (HOSPITAL_COMMUNITY)
Admission: RE | Admit: 2020-10-13 | Discharge: 2020-11-04 | DRG: 945 | Disposition: A | Discharge status: Home Health | Payer: Medicare PPO | Source: Intra-hospital | Attending: Physical Medicine & Rehabilitation | Admitting: Physical Medicine & Rehabilitation

## 2020-10-13 ENCOUNTER — Encounter (HOSPITAL_COMMUNITY): Payer: Self-pay

## 2020-10-13 DIAGNOSIS — S065X9D Traumatic subdural hemorrhage with loss of consciousness of unspecified duration, subsequent encounter: Secondary | ICD-10-CM | POA: Diagnosis not present

## 2020-10-13 DIAGNOSIS — Z79899 Other long term (current) drug therapy: Secondary | ICD-10-CM

## 2020-10-13 DIAGNOSIS — Z7901 Long term (current) use of anticoagulants: Secondary | ICD-10-CM

## 2020-10-13 DIAGNOSIS — J449 Chronic obstructive pulmonary disease, unspecified: Secondary | ICD-10-CM | POA: Diagnosis present

## 2020-10-13 DIAGNOSIS — S069X2S Unspecified intracranial injury with loss of consciousness of 31 minutes to 59 minutes, sequela: Secondary | ICD-10-CM | POA: Diagnosis not present

## 2020-10-13 DIAGNOSIS — N320 Bladder-neck obstruction: Secondary | ICD-10-CM | POA: Diagnosis present

## 2020-10-13 DIAGNOSIS — E8809 Other disorders of plasma-protein metabolism, not elsewhere classified: Secondary | ICD-10-CM | POA: Diagnosis present

## 2020-10-13 DIAGNOSIS — S065X9A Traumatic subdural hemorrhage with loss of consciousness of unspecified duration, initial encounter: Secondary | ICD-10-CM | POA: Diagnosis not present

## 2020-10-13 DIAGNOSIS — Z87891 Personal history of nicotine dependence: Secondary | ICD-10-CM | POA: Diagnosis not present

## 2020-10-13 DIAGNOSIS — S069X3S Unspecified intracranial injury with loss of consciousness of 1 hour to 5 hours 59 minutes, sequela: Secondary | ICD-10-CM | POA: Diagnosis not present

## 2020-10-13 DIAGNOSIS — S065XAA Traumatic subdural hemorrhage with loss of consciousness status unknown, initial encounter: Secondary | ICD-10-CM | POA: Diagnosis present

## 2020-10-13 DIAGNOSIS — H547 Unspecified visual loss: Secondary | ICD-10-CM | POA: Diagnosis present

## 2020-10-13 DIAGNOSIS — I700 Atherosclerosis of aorta: Secondary | ICD-10-CM | POA: Diagnosis not present

## 2020-10-13 DIAGNOSIS — R339 Retention of urine, unspecified: Secondary | ICD-10-CM | POA: Diagnosis not present

## 2020-10-13 DIAGNOSIS — R269 Unspecified abnormalities of gait and mobility: Secondary | ICD-10-CM | POA: Diagnosis present

## 2020-10-13 DIAGNOSIS — M4854XD Collapsed vertebra, not elsewhere classified, thoracic region, subsequent encounter for fracture with routine healing: Secondary | ICD-10-CM | POA: Diagnosis present

## 2020-10-13 DIAGNOSIS — K5900 Constipation, unspecified: Secondary | ICD-10-CM | POA: Diagnosis present

## 2020-10-13 DIAGNOSIS — D72829 Elevated white blood cell count, unspecified: Secondary | ICD-10-CM | POA: Diagnosis present

## 2020-10-13 DIAGNOSIS — E538 Deficiency of other specified B group vitamins: Secondary | ICD-10-CM | POA: Diagnosis present

## 2020-10-13 DIAGNOSIS — I712 Thoracic aortic aneurysm, without rupture: Secondary | ICD-10-CM | POA: Diagnosis present

## 2020-10-13 DIAGNOSIS — I4891 Unspecified atrial fibrillation: Secondary | ICD-10-CM | POA: Diagnosis present

## 2020-10-13 DIAGNOSIS — R29810 Facial weakness: Secondary | ICD-10-CM | POA: Diagnosis present

## 2020-10-13 DIAGNOSIS — S069X9A Unspecified intracranial injury with loss of consciousness of unspecified duration, initial encounter: Secondary | ICD-10-CM

## 2020-10-13 DIAGNOSIS — S72141A Displaced intertrochanteric fracture of right femur, initial encounter for closed fracture: Secondary | ICD-10-CM | POA: Diagnosis not present

## 2020-10-13 DIAGNOSIS — R414 Neurologic neglect syndrome: Secondary | ICD-10-CM | POA: Diagnosis not present

## 2020-10-13 DIAGNOSIS — W010XXD Fall on same level from slipping, tripping and stumbling without subsequent striking against object, subsequent encounter: Secondary | ICD-10-CM | POA: Diagnosis present

## 2020-10-13 DIAGNOSIS — M316 Other giant cell arteritis: Secondary | ICD-10-CM | POA: Diagnosis not present

## 2020-10-13 DIAGNOSIS — S069X0D Unspecified intracranial injury without loss of consciousness, subsequent encounter: Secondary | ICD-10-CM | POA: Diagnosis not present

## 2020-10-13 DIAGNOSIS — Z888 Allergy status to other drugs, medicaments and biological substances status: Secondary | ICD-10-CM | POA: Diagnosis not present

## 2020-10-13 DIAGNOSIS — S72141D Displaced intertrochanteric fracture of right femur, subsequent encounter for closed fracture with routine healing: Secondary | ICD-10-CM | POA: Diagnosis not present

## 2020-10-13 DIAGNOSIS — S7001XD Contusion of right hip, subsequent encounter: Secondary | ICD-10-CM | POA: Diagnosis not present

## 2020-10-13 DIAGNOSIS — S069XAA Unspecified intracranial injury with loss of consciousness status unknown, initial encounter: Secondary | ICD-10-CM | POA: Diagnosis present

## 2020-10-13 DIAGNOSIS — S72001A Fracture of unspecified part of neck of right femur, initial encounter for closed fracture: Secondary | ICD-10-CM | POA: Diagnosis present

## 2020-10-13 DIAGNOSIS — D62 Acute posthemorrhagic anemia: Secondary | ICD-10-CM | POA: Diagnosis not present

## 2020-10-13 DIAGNOSIS — I959 Hypotension, unspecified: Secondary | ICD-10-CM | POA: Diagnosis not present

## 2020-10-13 DIAGNOSIS — S069X0S Unspecified intracranial injury without loss of consciousness, sequela: Secondary | ICD-10-CM | POA: Diagnosis not present

## 2020-10-13 DIAGNOSIS — T148XXA Other injury of unspecified body region, initial encounter: Secondary | ICD-10-CM

## 2020-10-13 DIAGNOSIS — K5901 Slow transit constipation: Secondary | ICD-10-CM | POA: Diagnosis not present

## 2020-10-13 MED ORDER — ACETAMINOPHEN 325 MG PO TABS
650.0000 mg | ORAL_TABLET | Freq: Three times a day (TID) | ORAL | Status: DC
  Administered 2020-10-13 – 2020-11-04 (×65): 650 mg via ORAL
  Filled 2020-10-13 (×66): qty 2

## 2020-10-13 MED ORDER — PANTOPRAZOLE SODIUM 40 MG PO TBEC
40.0000 mg | DELAYED_RELEASE_TABLET | Freq: Every day | ORAL | Status: DC
  Administered 2020-10-14 – 2020-11-04 (×22): 40 mg via ORAL
  Filled 2020-10-13 (×23): qty 1

## 2020-10-13 MED ORDER — FLEET ENEMA 7-19 GM/118ML RE ENEM: 1.0000 | ENEMA | Freq: Once | RECTAL | Status: DC | PRN

## 2020-10-13 MED ORDER — ALUM & MAG HYDROXIDE-SIMETH 200-200-20 MG/5ML PO SUSP: 30.0000 mL | ORAL | Status: DC | PRN

## 2020-10-13 MED ORDER — POLYETHYLENE GLYCOL 3350 17 G PO PACK: 17.0000 g | PACK | Freq: Every day | ORAL | Status: DC | PRN

## 2020-10-13 MED ORDER — POLYETHYLENE GLYCOL 3350 17 G PO PACK: 17.0000 g | PACK | Freq: Every day | ORAL | Status: DC

## 2020-10-13 MED ORDER — ESCITALOPRAM OXALATE 10 MG PO TABS
20.0000 mg | ORAL_TABLET | Freq: Every day | ORAL | Status: DC
  Administered 2020-10-14 – 2020-11-04 (×22): 20 mg via ORAL
  Filled 2020-10-13 (×22): qty 2

## 2020-10-13 MED ORDER — DOCUSATE SODIUM 100 MG PO CAPS
100.0000 mg | ORAL_CAPSULE | Freq: Two times a day (BID) | ORAL | Status: DC
  Administered 2020-10-13 – 2020-11-04 (×43): 100 mg via ORAL
  Filled 2020-10-13 (×47): qty 1

## 2020-10-13 MED ORDER — ORAL CARE MOUTH RINSE
15.0000 mL | Freq: Two times a day (BID) | OROMUCOSAL | Status: DC
  Administered 2020-10-13 – 2020-11-04 (×32): 15 mL via OROMUCOSAL

## 2020-10-13 MED ORDER — TAMSULOSIN HCL 0.4 MG PO CAPS
0.8000 mg | ORAL_CAPSULE | Freq: Every day | ORAL | Status: DC
  Administered 2020-10-13 – 2020-11-03 (×22): 0.8 mg via ORAL
  Filled 2020-10-13 (×23): qty 2

## 2020-10-13 MED ORDER — DOCUSATE SODIUM 100 MG PO CAPS
100.0000 mg | ORAL_CAPSULE | Freq: Two times a day (BID) | ORAL | Status: DC
  Administered 2020-10-13: 100 mg via ORAL
  Filled 2020-10-13: qty 1

## 2020-10-13 MED ORDER — HYDROMORPHONE HCL 1 MG/ML IJ SOLN: 0.5000 mg | Freq: Four times a day (QID) | INTRAMUSCULAR | Status: DC | PRN

## 2020-10-13 MED ORDER — DIPHENHYDRAMINE HCL 12.5 MG/5ML PO ELIX: 12.5000 mg | ORAL_SOLUTION | Freq: Four times a day (QID) | ORAL | Status: DC | PRN

## 2020-10-13 MED ORDER — ACETAMINOPHEN 325 MG PO TABS: 325.0000 mg | ORAL_TABLET | ORAL | Status: DC | PRN

## 2020-10-13 MED ORDER — ENOXAPARIN SODIUM 30 MG/0.3ML IJ SOSY
30.0000 mg | PREFILLED_SYRINGE | Freq: Two times a day (BID) | INTRAMUSCULAR | Status: DC
  Administered 2020-10-13 – 2020-11-04 (×44): 30 mg via SUBCUTANEOUS
  Filled 2020-10-13 (×44): qty 0.3

## 2020-10-13 MED ORDER — PROCHLORPERAZINE MALEATE 5 MG PO TABS: 5.0000 mg | ORAL_TABLET | Freq: Four times a day (QID) | ORAL | Status: DC | PRN

## 2020-10-13 MED ORDER — BISACODYL 10 MG RE SUPP: 10.0000 mg | Freq: Every day | RECTAL | Status: DC | PRN

## 2020-10-13 MED ORDER — PROCHLORPERAZINE 25 MG RE SUPP: 12.5000 mg | Freq: Four times a day (QID) | RECTAL | Status: DC | PRN

## 2020-10-13 MED ORDER — OXYCODONE HCL 5 MG PO TABS
5.0000 mg | ORAL_TABLET | ORAL | Status: DC | PRN
  Administered 2020-10-15 – 2020-10-29 (×11): 5 mg via ORAL
  Filled 2020-10-13 (×12): qty 1

## 2020-10-13 MED ORDER — GUAIFENESIN-DM 100-10 MG/5ML PO SYRP
5.0000 mL | ORAL_SOLUTION | Freq: Four times a day (QID) | ORAL | Status: DC | PRN
  Administered 2020-10-14: 10 mL via ORAL
  Filled 2020-10-13: qty 10

## 2020-10-13 MED ORDER — FOLIC ACID 1 MG PO TABS
500.0000 ug | ORAL_TABLET | Freq: Every day | ORAL | Status: DC
  Administered 2020-10-14 – 2020-10-19 (×6): 0.5 mg via ORAL
  Filled 2020-10-13 (×6): qty 1

## 2020-10-13 MED ORDER — ENOXAPARIN SODIUM 40 MG/0.4ML IJ SOSY: 40.0000 mg | PREFILLED_SYRINGE | INTRAMUSCULAR | Status: DC

## 2020-10-13 MED ORDER — METHOCARBAMOL 500 MG PO TABS
500.0000 mg | ORAL_TABLET | Freq: Three times a day (TID) | ORAL | Status: DC | PRN
  Administered 2020-10-14 – 2020-10-29 (×5): 500 mg via ORAL
  Filled 2020-10-13 (×5): qty 1

## 2020-10-13 MED ORDER — POLYETHYLENE GLYCOL 3350 17 G PO PACK
17.0000 g | PACK | Freq: Two times a day (BID) | ORAL | Status: DC
  Administered 2020-10-13 – 2020-11-03 (×29): 17 g via ORAL
  Filled 2020-10-13 (×44): qty 1

## 2020-10-13 MED ORDER — PROCHLORPERAZINE EDISYLATE 10 MG/2ML IJ SOLN: 5.0000 mg | Freq: Four times a day (QID) | INTRAMUSCULAR | Status: DC | PRN

## 2020-10-13 MED ORDER — LEVETIRACETAM 500 MG PO TABS
500.0000 mg | ORAL_TABLET | Freq: Two times a day (BID) | ORAL | Status: AC
  Administered 2020-10-13 – 2020-10-14 (×3): 500 mg via ORAL
  Filled 2020-10-13 (×2): qty 1

## 2020-10-13 MED ORDER — LEVETIRACETAM 500 MG PO TABS: 500.0000 mg | ORAL_TABLET | Freq: Two times a day (BID) | ORAL | Status: DC

## 2020-10-13 NOTE — Progress Notes (Signed)
Per telemetry, pt had 3 beat run of V-tach; stripped saved

## 2020-10-13 NOTE — H&P (Signed)
Physical Medicine and Rehabilitation Admission H&P    Chief Complaint  Patient presents with   Functional deficits due to fall   TBI/hip fracture.     HPI:  Nathan Mitchell. Roxan Hockey III is an 87 year old male with history of CAF,- on Xarelto giant cell arteritis, thoracic aneurysm, recent BOO (foley placed 5/22) who was admitted on 10/08/20 after a tripping over his foley catheter and falling while getting up from a table at home. He was found to have large left SDH overlying left cerebral convexity with significant mass effect upon left hemisphere with effacement of left lateral ventricle and 7 mm left to right midline shift as well as severely comminuted and displaced proximal right IT femur fracture. Xarelto reversed with Kcentra and he was taken to OR for left burr hole evacuation of SDH with placement of ventriculostomy drain by Dr. Maurice Small as well as ORIF right hip by Dr. Roda Shutters. Post op WBAT and continues to have issues with confusion. Subdural drain removed on 06/12 and hs reported issues with back pain.  He has received 2 units PRBC for ABLA.   CT thoracolumbar spine done 06/13 (due to extensive bruising and pain with activity) and revealed recent T4 compression fracture with severe spinal stenosis L3/4 and asymmetric patchy pulmonary opacities RLL and RUE question atelectasis v/s PNA.  He continues on Keppra for seizure prophylaxis.   Dr. Maurice Small reviewed back films and felt no intervention or brace needed.  Therapy ongoing and patient limited by confusion, cognitive deficits, premorbid visual deficits, delayed processing. Wife at bedside. Difficulty elevating right leg due to pain.     Review of Systems  Constitutional:  Negative for chills and fever.  HENT:  Negative for hearing loss and tinnitus.   Eyes:        Blind in left eye and has 20% peripheral vision on the right.  Respiratory:  Negative for cough and shortness of breath.   Cardiovascular:  Positive for leg swelling. Negative  for chest pain and palpitations.  Gastrointestinal:  Positive for constipation. Negative for abdominal pain, heartburn, nausea and vomiting.  Genitourinary:  Negative for dysuria.       Nocturia -used to get up every 2 hours. Has had frequency for sometime before BOO.  Musculoskeletal:  Positive for back pain, joint pain and myalgias.  Skin:  Negative for rash.  Neurological:  Positive for weakness (RLE limited by pain inhibition).  Psychiatric/Behavioral:  The patient is not nervous/anxious.     Past Medical History:  Diagnosis Date   Aortic atherosclerosis (HCC)    Atrial fibrillation (HCC)    Giant cell arteritis (HCC)    Osteopenia    Pure hypercholesterolemia    Thoracic aortic aneurysm (HCC)    Thrombophilia (HCC)     Past Surgical History:  Procedure Laterality Date   APPENDECTOMY     CRANIOTOMY Left 10/08/2020   Procedure: LEFT BURR HOLE DRAINAGE OF SUBDURAL HEMATOMA;  Surgeon: Jadene Pierini, MD;  Location: [facility] OR;  Service: Neurosurgery;  Laterality: Left;   HERNIA REPAIR     INTRAMEDULLARY (IM) NAIL INTERTROCHANTERIC Right 10/08/2020   Procedure: RIGHT INTRAMEDULLARY (IM) NAIL INTERTROCHANTRIC WITH PREVENA WOUND VAC APPLICATION;  Surgeon: Tarry Kos, MD;  Location: [facility] OR;  Service: Orthopedics;  Laterality: Right;    Family History  Problem Relation Age of Onset   Parkinson's disease Father    Cancer Brother    Stroke Brother      Social History: Married. Independent without  AD. 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 He  reports that he has quit smoking. He has never used smokeless tobacco. He reports current alcohol use of about 3.0 standard drinks of alcohol per week but has not had any ETOH since foley placement.  He reports that he does not use drugs.   Allergies  Allergen Reactions   Tiotropium Bromide Monohydrate     Other reaction(s): ineffecticve   Umeclidinium-Vilanterol     Other reaction(s): ineffective    Medications  Prior to Admission  Medication Sig Dispense Refill   acetaminophen (TYLENOL) 500 MG tablet Take 500 mg by mouth every 6 (six) hours as needed for moderate pain.     cholecalciferol (VITAMIN D) 1000 units tablet Take 1,000 Units by mouth daily.     CRANBERRY EXTRACT PO Take 1 capsule by mouth at bedtime.     escitalopram (LEXAPRO) 20 MG tablet Take 20 mg by mouth daily.     famotidine (PEPCID) 40 MG tablet Take 40 mg by mouth daily as needed.     ferrous gluconate (FERGON) 240 (27 FE) MG tablet Take 240 mg by mouth every Monday, Wednesday, and Friday.     folic acid (FOLVITE) 1 MG tablet Take 1 mg by mouth daily.     HYDROcodone-acetaminophen (NORCO/VICODIN) 5-325 MG tablet Take 1 tablet by mouth every 6 (six) hours as needed. (Patient taking differently: Take 1 tablet by mouth every 6 (six) hours as needed for moderate pain.) 15 tablet 0   methotrexate (RHEUMATREX) 2.5 MG tablet Take 20 mg by mouth every Friday.     Omeprazole 20 MG TBEC Take 20 mg by mouth daily.      rivaroxaban (XARELTO) 20 MG TABS tablet Take 1 tablet (20 mg total) by mouth daily with supper. 30 tablet 0   tamsulosin (FLOMAX) 0.4 MG CAPS capsule Take 0.8 mg by mouth daily.     vitamin B-12 (CYANOCOBALAMIN) 1000 MCG tablet Take 1,000 mcg by mouth daily.      Drug Regimen Review  Drug regimen was reviewed and remains appropriate with no significant issues identified  Home: Home Living Family/patient expects to be discharged to:: Private residence Living Arrangements: Spouse/significant other   Functional History:    Functional Status:  Mobility:          ADL:    Cognition: Cognition Orientation Level: Oriented X4     Blood pressure 124/76, pulse 89, temperature 98.2 F (36.8 C), temperature source Oral, resp. rate 19, height  (1.854 m), weight 86.6 kg, SpO2 92 %. Physical Exam Constitutional:      Comments: WNWD. Does not attempt to make eye contact. Speech clear.  Wife at bedside HENT:      Head:     Comments: Well healed incision mid scalp. Small stapled incision left scalp (site of prior IVC?)    Mouth/Throat:     Mouth: Mucous membranes are dry.  Cardiovascular:     Rate and Rhythm: Rhythm irregular.  Pulmonary:     Comments: Audible wheezing at times. Oxygen saturation down to mid 80's on he monitor a couple of times Abdominal:     General: There is distension.     Tenderness: There is no abdominal tenderness.     Comments: High pitched sounds  Musculoskeletal:     Comments: Wound VAC in place on right hip. Two small incisions on right hip/thigh with foam dressing that were soaked with serosanguinous drainage. Moderate edema left thigh  with yellow tinged bruising. Right flank to lower spine with diffuse purple bruise.   Neurological:     Mental Status: He is alert and oriented to person, place, and time.     Comments: He was able to state age, DOB, city, [place] but thought that it was "April 22nd". Mild right facial weakness. He is able to follow simple motor commands. Right gaze preference with left neglect.  5/5 strength except for RLE limited by pain but with 5/5 DF and PF.   Results for orders placed or performed during the hospital encounter of 10/07/20 (from the past 48 hour(s))  CBC     Status: Abnormal   Collection Time: 10/12/20 12:54 AM  Result Value Ref Range   WBC 9.9 4.0 - 10.5 K/uL   RBC 2.45 (L) 4.22 - 5.81 MIL/uL   Hemoglobin 7.3 (L) 13.0 - 17.0 g/dL   HCT 62.2 (L) 29.7 - 98.9 %   MCV 92.2 80.0 - 100.0 fL   MCH 29.8 26.0 - 34.0 pg   MCHC 32.3 30.0 - 36.0 g/dL   RDW 21.1 (H) 94.1 - 74.0 %   Platelets 241 150 - 400 K/uL   nRBC 0.0 0.0 - 0.2 %    Comment: Performed at [facility], [address], [place], [place] [address]  Basic metabolic panel     Status: Abnormal   Collection Time: 10/12/20 12:54 AM  Result Value Ref Range   Sodium 138 135 - 145 mmol/L   Potassium 3.8 3.5 - 5.1 mmol/L   Chloride 107 98 - 111 mmol/L   CO2 24 22 - 32 mmol/L    Glucose, Bld 116 (H) 70 - 99 mg/dL    Comment: Glucose reference range applies only to samples taken after fasting for at least 8 hours.   BUN 21 8 - 23 mg/dL   Creatinine, Ser 1.85 0.61 - 1.24 mg/dL   Calcium 8.0 (L) 8.9 - 10.3 mg/dL   GFR, Estimated >63 >14 mL/min    Comment: (NOTE) Calculated using the CKD-EPI Creatinine Equation (2021)    Anion gap 7 5 - 15    Comment: Performed at [facility], [address] [place], [place] [address]   CT THORACIC SPINE WO CONTRAST  Result Date: 10/12/2020 CLINICAL DATA:  Mid back pain.  Lumbar radiculopathy. EXAM: CT THORACIC AND LUMBAR SPINE WITHOUT CONTRAST TECHNIQUE: Multidetector CT imaging of the thoracic and lumbar spine was performed without contrast. Multiplanar CT image reconstructions were also generated. COMPARISON:  CT abdomen and pelvis 09/05/2020.  CT chest 12/25/2019. FINDINGS: CT THORACIC SPINE FINDINGS Alignment: Mild thoracic dextroscoliosis. Trace retrolisthesis of T12 on L1. Vertebrae: A T4 compression fracture is new from the 2021 chest CT and appears recent/unhealed with 20% central vertebral body height loss and no significant retropulsion or posterior element involvement. The bones are diffusely osteopenic. There is a hemangioma in the T2 vertebral body. Paraspinal and other soft tissues: Moderately large sliding hiatal hernia. Aortic and coronary atherosclerosis. Partially visualized aneurysmal dilatation of the aortic arch measuring 4.8 cm in diameter, similar to the prior chest CT. Small bilateral pleural effusions. Asymmetric patchy opacity posteriorly in the right lower lobe and right upper lobe with assessment limited by respiratory motion artifact and small field of view. Disc levels: Mild for age thoracic disc degeneration. Asymmetrically advanced right-sided facet arthrosis in the upper thoracic and lower cervical spine. No evidence of high-grade spinal canal or neural foraminal stenosis. CT LUMBAR SPINE FINDINGS  Segmentation: 5  lumbar type vertebrae. Alignment: Normal. Vertebrae: No acute fracture or suspicious osseous lesion. Similar appearance of multiple Schmorl's nodes. Partial fusion across the L4-5 disc space. Paraspinal and other soft tissues: Aortic atherosclerosis. Unchanged small right adrenal adenoma. Mildly increased size of multiple left para-aortic lymph nodes measuring up to 1.2 cm in short axis and of left common and external iliac lymph nodes measuring up to 1.4 cm in short axis. Disc levels: Disc bulging and posterior element hypertrophy result in severe spinal stenosis at L3-4, moderate to severe spinal stenosis at L2-3, and mild spinal stenosis at L4-5. There is mild-to-moderate multilevel neural foraminal stenosis. IMPRESSION: 1. Recent T4 compression fracture with mild height loss. 2. Lumbar disc and facet degeneration most notable at L3-4 where there is severe spinal stenosis. 3. Small bilateral pleural effusions. 4. Asymmetric patchy pulmonary opacity posteriorly in the right lower lobe and right upper lobe which may reflect atelectasis or pneumonia. 5. Mildly increased left-sided retroperitoneal lymphadenopathy, nonspecific. 6. Aortic Atherosclerosis (ICD10-I70.0). Electronically Signed   By: Sebastian Ache M.D.   On: 10/12/2020 17:26   CT LUMBAR SPINE WO CONTRAST  Result Date: 10/12/2020 CLINICAL DATA:  Mid back pain.  Lumbar radiculopathy. EXAM: CT THORACIC AND LUMBAR SPINE WITHOUT CONTRAST TECHNIQUE: Multidetector CT imaging of the thoracic and lumbar spine was performed without contrast. Multiplanar CT image reconstructions were also generated. COMPARISON:  CT abdomen and pelvis 09/05/2020.  CT chest 12/25/2019. FINDINGS: CT THORACIC SPINE FINDINGS Alignment: Mild thoracic dextroscoliosis. Trace retrolisthesis of T12 on L1. Vertebrae: A T4 compression fracture is new from the 2021 chest CT and appears recent/unhealed with 20% central vertebral body height loss and no significant retropulsion  or posterior element involvement. The bones are diffusely osteopenic. There is a hemangioma in the T2 vertebral body. Paraspinal and other soft tissues: Moderately large sliding hiatal hernia. Aortic and coronary atherosclerosis. Partially visualized aneurysmal dilatation of the aortic arch measuring 4.8 cm in diameter, similar to the prior chest CT. Small bilateral pleural effusions. Asymmetric patchy opacity posteriorly in the right lower lobe and right upper lobe with assessment limited by respiratory motion artifact and small field of view. Disc levels: Mild for age thoracic disc degeneration. Asymmetrically advanced right-sided facet arthrosis in the upper thoracic and lower cervical spine. No evidence of high-grade spinal canal or neural foraminal stenosis. CT LUMBAR SPINE FINDINGS Segmentation: 5 lumbar type vertebrae. Alignment: Normal. Vertebrae: No acute fracture or suspicious osseous lesion. Similar appearance of multiple Schmorl's nodes. Partial fusion across the L4-5 disc space. Paraspinal and other soft tissues: Aortic atherosclerosis. Unchanged small right adrenal adenoma. Mildly increased size of multiple left para-aortic lymph nodes measuring up to 1.2 cm in short axis and of left common and external iliac lymph nodes measuring up to 1.4 cm in short axis. Disc levels: Disc bulging and posterior element hypertrophy result in severe spinal stenosis at L3-4, moderate to severe spinal stenosis at L2-3, and mild spinal stenosis at L4-5. There is mild-to-moderate multilevel neural foraminal stenosis. IMPRESSION: 1. Recent T4 compression fracture with mild height loss. 2. Lumbar disc and facet degeneration most notable at L3-4 where there is severe spinal stenosis. 3. Small bilateral pleural effusions. 4. Asymmetric patchy pulmonary opacity posteriorly in the right lower lobe and right upper lobe which may reflect atelectasis or pneumonia. 5. Mildly increased left-sided retroperitoneal lymphadenopathy,  nonspecific. 6. Aortic Atherosclerosis (ICD10-I70.0). Electronically Signed   By: Sebastian Ache M.D.   On: 10/12/2020 17:26   DG Abd Portable 1V  Result Date: 10/13/2020 CLINICAL DATA:  Constipation EXAM: PORTABLE ABDOMEN - 1 VIEW COMPARISON:  CT abdomen and pelvis Sep 05, 2020 FINDINGS: Mild stool volume in colon. No bowel dilatation or air-fluid level to suggest bowel obstruction. No free air. Moderate hiatal hernia. Postoperative change proximal right femur. Lung bases clear. IMPRESSION: Mild stool volume. No bowel obstruction or free air. Moderate hiatal hernia. Electronically Signed   By: Bretta BangWilliam  Woodruff III M.D.   On: 10/13/2020 18:56       Medical Problem List and Plan: 1.  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  Antithrombotics: -DVT/anticoagulation:  Pharmaceutical: Lovenox bid  -antiplatelet therapy: N/A 3. Pain Management: Schedule tylenol 650mg  TID. LFTs reviewed and wnl. Continue oxycodone/robaxin prn 4. Mood: LCSW to follow for evaluation and support.   -antipsychotic agents: N/A 5. Neuropsych: This patient is not fully capable of making decisions on his own behalf.  --Today is the first day that he has been clear and not confused per reports.  6. Skin/Wound Care: Routine presssure relief measures 7. Fluids/Electrolytes/Nutrition: Appetite has been good per wife.  --Monitor I/O. Check CMET in am.  8. Right femur fracture: WBAT. Prevena VAC in place for 3 more days  --Continue to monitor wound drainage 9. L-SDH: On Keppr X 7 days for seizure prophylaxis. Continue until 6/16  10 Giant cell arteritis with visual loss: Was on methotrexate weekly.  --Followed by Dr. Lennette BihariHawke PTA.  11. ONG:[id number]:Monitor HR tid--incidental finding years ago and has been controlled.   --To follow up with Card? PCP regarding d/c of Xarelto per notes.  12. Constipation: Has not had BM since admission--eating well, no abdominal pain  --will  order KUB. Start miralax daily--give two doses today. Continue Colace 100mg  BID.  13. Urinary retention: Has failed one voiding trial by Dr. Cardell PeachGay.  --plans for ultrasound of bladder/prostate? 06/24?  --Continue Flomax at bedtime.   14. Leucocytosis: Resolving without signs of infection  --encourage pulmonary toilet for likely Right atelectasis.  15. ABLA: Continue to monitor H/H with serial checks and monitor for signs of bleeding. --Monitor hematoma right hip./flank.   --recheck CBC in am. 16. H/o COPD: Stable. Did not like trial of MDI per wife.    I have personally performed a face to face diagnostic evaluation, including, but not limited to relevant history and physical exam findings, of this patient and developed relevant assessment and plan.  Additionally, I have reviewed and concur with the physician assistant's documentation above.  Jacquelynn CreePamela S Love, PA-C   Horton ChinKrutika P Ilia Engelbert, MD 10/13/2020

## 2020-10-13 NOTE — Progress Notes (Signed)
[facility] Progress Note  5 Days Post-Op  Subjective: CC-  Wife at bedside. Only complaining of some right hip pain. Otherwise denies pain. Denies back pain. Slept well last night. Ate 100% of his breakfast, unsure when last BM was. Denies abdominal pain, nausea, vomiting.  Objective: Vital signs in last 24 hours: Temp:  [97.8 F (36.6 C)-98.3 F (36.8 C)] 97.9 F (36.6 C) (06/14 0747) Pulse Rate:  [78-98] 88 (06/14 0747) Resp:  [18-20] 18 (06/14 0747) BP: (125-148)/(46-84) 141/64 (06/14 0747) SpO2:  [92 %-99 %] 96 % (06/14 0747) Last BM Date:  (PTA)  Intake/Output from previous day: 06/13 0701 - 06/14 0700 In: 987 [P.O.:777; I.V.:110; IV Piggyback:100] Out: 1450 [Urine:1450] Intake/Output this shift: No intake/output data recorded.  PE: Gen:  Alert, NAD, pleasant HEENT: EOM's intact, pupils equal and round Card:  irregular, palpable pedal pulses Pulm:  CTAB, no W/R/R, rate and effort normal Abd: Soft, NT/ND, +BS Ext:  diffuse edema noted to right hip/flank. Prevena back to right hip incision. Calves soft and nontender bilaterally without ttp Spine: T and L spine nontender Skin: no rashes noted, warm and dry  Lab Results:  Recent Labs    10/11/20 0049 10/11/20 0632 10/12/20 0054  WBC 11.7*  --  9.9  HGB 6.8* 7.2* 7.3*  HCT 21.9* 22.7* 22.6*  PLT 236  --  241   BMET Recent Labs    10/11/20 0049 10/12/20 0054  NA 139 138  K 3.7 3.8  CL 108 107  CO2 26 24  GLUCOSE 123* 116*  BUN 19 21  CREATININE 0.90 0.93  CALCIUM 8.1* 8.0*   PT/INR No results for input(s): LABPROT, INR in the last 72 hours. CMP     Component Value Date/Time   NA 138 10/12/2020 0054   K 3.8 10/12/2020 0054   CL 107 10/12/2020 0054   CO2 24 10/12/2020 0054   GLUCOSE 116 (H) 10/12/2020 0054   BUN 21 10/12/2020 0054   CREATININE 0.93 10/12/2020 0054   CALCIUM 8.0 (L) 10/12/2020 0054   PROT 6.6 12/16/2017 1051   ALBUMIN 3.8 12/16/2017 1051   AST 15 12/16/2017 1051    ALT 16 12/16/2017 1051   ALKPHOS 44 12/16/2017 1051   BILITOT 1.0 12/16/2017 1051   GFRNONAA >60 10/12/2020 0054   GFRAA >60 02/26/2018 1032   Lipase  No results found for: LIPASE     Studies/Results: CT THORACIC SPINE WO CONTRAST  Result Date: 10/12/2020 CLINICAL DATA:  Mid back pain.  Lumbar radiculopathy. EXAM: CT THORACIC AND LUMBAR SPINE WITHOUT CONTRAST TECHNIQUE: Multidetector CT imaging of the thoracic and lumbar spine was performed without contrast. Multiplanar CT image reconstructions were also generated. COMPARISON:  CT abdomen and pelvis 09/05/2020.  CT chest 12/25/2019. FINDINGS: CT THORACIC SPINE FINDINGS Alignment: Mild thoracic dextroscoliosis. Trace retrolisthesis of T12 on L1. Vertebrae: A T4 compression fracture is new from the 2021 chest CT and appears recent/unhealed with 20% central vertebral body height loss and no significant retropulsion or posterior element involvement. The bones are diffusely osteopenic. There is a hemangioma in the T2 vertebral body. Paraspinal and other soft tissues: Moderately large sliding hiatal hernia. Aortic and coronary atherosclerosis. Partially visualized aneurysmal dilatation of the aortic arch measuring 4.8 cm in diameter, similar to the prior chest CT. Small bilateral pleural effusions. Asymmetric patchy opacity posteriorly in the right lower lobe and right upper lobe with assessment limited by respiratory motion artifact and small field of view. Disc levels: Mild for age thoracic disc  degeneration. Asymmetrically advanced right-sided facet arthrosis in the upper thoracic and lower cervical spine. No evidence of high-grade spinal canal or neural foraminal stenosis. CT LUMBAR SPINE FINDINGS Segmentation: 5 lumbar type vertebrae. Alignment: Normal. Vertebrae: No acute fracture or suspicious osseous lesion. Similar appearance of multiple Schmorl's nodes. Partial fusion across the L4-5 disc space. Paraspinal and other soft tissues: Aortic  atherosclerosis. Unchanged small right adrenal adenoma. Mildly increased size of multiple left para-aortic lymph nodes measuring up to 1.2 cm in short axis and of left common and external iliac lymph nodes measuring up to 1.4 cm in short axis. Disc levels: Disc bulging and posterior element hypertrophy result in severe spinal stenosis at L3-4, moderate to severe spinal stenosis at L2-3, and mild spinal stenosis at L4-5. There is mild-to-moderate multilevel neural foraminal stenosis. IMPRESSION: 1. Recent T4 compression fracture with mild height loss. 2. Lumbar disc and facet degeneration most notable at L3-4 where there is severe spinal stenosis. 3. Small bilateral pleural effusions. 4. Asymmetric patchy pulmonary opacity posteriorly in the right lower lobe and right upper lobe which may reflect atelectasis or pneumonia. 5. Mildly increased left-sided retroperitoneal lymphadenopathy, nonspecific. 6. Aortic Atherosclerosis (ICD10-I70.0). Electronically Signed   By: Sebastian Ache M.D.   On: 10/12/2020 17:26   CT LUMBAR SPINE WO CONTRAST  Result Date: 10/12/2020 CLINICAL DATA:  Mid back pain.  Lumbar radiculopathy. EXAM: CT THORACIC AND LUMBAR SPINE WITHOUT CONTRAST TECHNIQUE: Multidetector CT imaging of the thoracic and lumbar spine was performed without contrast. Multiplanar CT image reconstructions were also generated. COMPARISON:  CT abdomen and pelvis 09/05/2020.  CT chest 12/25/2019. FINDINGS: CT THORACIC SPINE FINDINGS Alignment: Mild thoracic dextroscoliosis. Trace retrolisthesis of T12 on L1. Vertebrae: A T4 compression fracture is new from the 2021 chest CT and appears recent/unhealed with 20% central vertebral body height loss and no significant retropulsion or posterior element involvement. The bones are diffusely osteopenic. There is a hemangioma in the T2 vertebral body. Paraspinal and other soft tissues: Moderately large sliding hiatal hernia. Aortic and coronary atherosclerosis. Partially visualized  aneurysmal dilatation of the aortic arch measuring 4.8 cm in diameter, similar to the prior chest CT. Small bilateral pleural effusions. Asymmetric patchy opacity posteriorly in the right lower lobe and right upper lobe with assessment limited by respiratory motion artifact and small field of view. Disc levels: Mild for age thoracic disc degeneration. Asymmetrically advanced right-sided facet arthrosis in the upper thoracic and lower cervical spine. No evidence of high-grade spinal canal or neural foraminal stenosis. CT LUMBAR SPINE FINDINGS Segmentation: 5 lumbar type vertebrae. Alignment: Normal. Vertebrae: No acute fracture or suspicious osseous lesion. Similar appearance of multiple Schmorl's nodes. Partial fusion across the L4-5 disc space. Paraspinal and other soft tissues: Aortic atherosclerosis. Unchanged small right adrenal adenoma. Mildly increased size of multiple left para-aortic lymph nodes measuring up to 1.2 cm in short axis and of left common and external iliac lymph nodes measuring up to 1.4 cm in short axis. Disc levels: Disc bulging and posterior element hypertrophy result in severe spinal stenosis at L3-4, moderate to severe spinal stenosis at L2-3, and mild spinal stenosis at L4-5. There is mild-to-moderate multilevel neural foraminal stenosis. IMPRESSION: 1. Recent T4 compression fracture with mild height loss. 2. Lumbar disc and facet degeneration most notable at L3-4 where there is severe spinal stenosis. 3. Small bilateral pleural effusions. 4. Asymmetric patchy pulmonary opacity posteriorly in the right lower lobe and right upper lobe which may reflect atelectasis or pneumonia. 5. Mildly increased left-sided retroperitoneal lymphadenopathy, nonspecific.  6. Aortic Atherosclerosis (ICD10-I70.0). Electronically Signed   By: Sebastian Ache M.D.   On: 10/12/2020 17:26    Anti-infectives: Anti-infectives (From admission, onward)    Start     Dose/Rate Route Frequency Ordered Stop   10/08/20  1400  ceFAZolin (ANCEF) IVPB 2g/100 mL premix        2 g 200 mL/hr over 30 Minutes Intravenous On call to O.R. 10/08/20 0955 10/08/20 1810        Assessment/Plan Fall out of chair   TBI/R SDH - S/P burr hole evac by Dr. Maurice Small 6/9, drain removed, Keppra for 7d, exam improved some, TBI therapies Anticoagulation with Xarelto for AF - reversed with Kcentra, hold. Recommend o/p f/u with cardiology re: permanent d/c. R hip FX - S/P ORIF by Dr. Roda Shutters 6/9, WBAT ABL anemia - post-op, 1u pRBC 6/12, inappropriate response, but no sources of bleeding and asymptomatic, cont to monitor T4 fx - discussed with NSGY, no intervention needed, no brace / no restrictions - activity as tolerated FEN - reg diet, miralax/colace Chronic urinary retention - continue home foley, flomax VTE - PAS, LMWH Dispo - 4NP. CIR following - stable for rehab if accepted and bed available.    LOS: 6 days    Franne Forts, [facility] 10/13/2020, 11:37 AM Please see Amion for pager number during day hours 7:00am-4:30pm

## 2020-10-13 NOTE — Progress Notes (Addendum)
Neurosurgery Service Progress Note  Subjective: No acute events overnight, no headaches / N / V   Objective: Vitals:   10/12/20 1933 10/12/20 2311 10/12/20 2312 10/13/20 0312  BP: (!) 148/73  129/81 127/84  Pulse: 96  98 92  Resp: '20  18 20  ' Temp: 98.1 F (36.7 C) 98.3 F (36.8 C)  98.3 F (36.8 C)  TempSrc: Oral Oral  Oral  SpO2: 99%  96% 99%  Weight:      Height:        Physical Exam: Aox3, PERRL, gaze conjugate, Fcx4 but limited in casted RLE, RUE strength appears to be back to normal / symmetric  Assessment & Plan: 87 y.o. man s/p burr hole evacuation of SDH, exam improved post-op, likely with superimposed delirium.  -no change in neurosurgical plan of care  Judith Part  10/13/20 7:43 AM  Was having some back pain yesterday, CT T-spine reviewed, shows compression frx of T4 w/o LOH/retropulsion, no intervention needed, no brace / no restrictions - activity as tolerated.

## 2020-10-13 NOTE — Progress Notes (Signed)
PT Cancellation Note  Patient Details Name: Nathan Mitchell MRN: [id number] DOB: 1930/10/27   Cancelled Treatment:    Reason Eval/Treat Not Completed: Other (comment).  Pt is actively moving to rehab (RN packing and getting the bed ready to go).  Will defer all further therapy to next venue of care.   Thanks,  Corinna Capra, PT, DPT  Acute Rehabilitation Ortho Tech Supervisor [phone] pager #[phone] office      Nathan Mitchell 10/13/2020, 4:22 PM

## 2020-10-13 NOTE — Progress Notes (Signed)
Inpatient Rehab Admissions Coordinator:   I received insurance authorization and have a bed on CIR today. RN may call report to [phone].  Megan Salon, MS, CCC-SLP Rehab Admissions Coordinator  [phone] (celll) [phone] (office)

## 2020-10-13 NOTE — Discharge Summary (Signed)
[facility] Discharge Summary   Patient ID: Nathan Mitchell MRN: [id number] DOB/AGE: [birth year] 87 y.o.  Admit date: 10/07/2020 Discharge date: 10/13/2020  Admitting Diagnosis: Fall at home on Xarelto Left subdural hematoma R intertrochanteric femur fracture  Discharge Diagnosis Fall out of chair TBI/R SDH  Anticoagulation with Xarelto for AFib Right hip Fracture ABL anemia T4 fracture  Consultants Neurosurgery Orthopedics  Imaging: CT THORACIC SPINE WO CONTRAST  Result Date: 10/12/2020 CLINICAL DATA:  Mid back pain.  Lumbar radiculopathy. EXAM: CT THORACIC AND LUMBAR SPINE WITHOUT CONTRAST TECHNIQUE: Multidetector CT imaging of the thoracic and lumbar spine was performed without contrast. Multiplanar CT image reconstructions were also generated. COMPARISON:  CT abdomen and pelvis 09/05/2020.  CT chest 12/25/2019. FINDINGS: CT THORACIC SPINE FINDINGS Alignment: Mild thoracic dextroscoliosis. Trace retrolisthesis of T12 on L1. Vertebrae: A T4 compression fracture is new from the 2021 chest CT and appears recent/unhealed with 20% central vertebral body height loss and no significant retropulsion or posterior element involvement. The bones are diffusely osteopenic. There is a hemangioma in the T2 vertebral body. Paraspinal and other soft tissues: Moderately large sliding hiatal hernia. Aortic and coronary atherosclerosis. Partially visualized aneurysmal dilatation of the aortic arch measuring 4.8 cm in diameter, similar to the prior chest CT. Small bilateral pleural effusions. Asymmetric patchy opacity posteriorly in the right lower lobe and right upper lobe with assessment limited by respiratory motion artifact and small field of view. Disc levels: Mild for age thoracic disc degeneration. Asymmetrically advanced right-sided facet arthrosis in the upper thoracic and lower cervical spine. No evidence of high-grade spinal canal or neural foraminal stenosis. CT LUMBAR SPINE  FINDINGS Segmentation: 5 lumbar type vertebrae. Alignment: Normal. Vertebrae: No acute fracture or suspicious osseous lesion. Similar appearance of multiple Schmorl's nodes. Partial fusion across the L4-5 disc space. Paraspinal and other soft tissues: Aortic atherosclerosis. Unchanged small right adrenal adenoma. Mildly increased size of multiple left para-aortic lymph nodes measuring up to 1.2 cm in short axis and of left common and external iliac lymph nodes measuring up to 1.4 cm in short axis. Disc levels: Disc bulging and posterior element hypertrophy result in severe spinal stenosis at L3-4, moderate to severe spinal stenosis at L2-3, and mild spinal stenosis at L4-5. There is mild-to-moderate multilevel neural foraminal stenosis. IMPRESSION: 1. Recent T4 compression fracture with mild height loss. 2. Lumbar disc and facet degeneration most notable at L3-4 where there is severe spinal stenosis. 3. Small bilateral pleural effusions. 4. Asymmetric patchy pulmonary opacity posteriorly in the right lower lobe and right upper lobe which may reflect atelectasis or pneumonia. 5. Mildly increased left-sided retroperitoneal lymphadenopathy, nonspecific. 6. Aortic Atherosclerosis (ICD10-I70.0). Electronically Signed   By: Sebastian Ache M.D.   On: 10/12/2020 17:26   CT LUMBAR SPINE WO CONTRAST  Result Date: 10/12/2020 CLINICAL DATA:  Mid back pain.  Lumbar radiculopathy. EXAM: CT THORACIC AND LUMBAR SPINE WITHOUT CONTRAST TECHNIQUE: Multidetector CT imaging of the thoracic and lumbar spine was performed without contrast. Multiplanar CT image reconstructions were also generated. COMPARISON:  CT abdomen and pelvis 09/05/2020.  CT chest 12/25/2019. FINDINGS: CT THORACIC SPINE FINDINGS Alignment: Mild thoracic dextroscoliosis. Trace retrolisthesis of T12 on L1. Vertebrae: A T4 compression fracture is new from the 2021 chest CT and appears recent/unhealed with 20% central vertebral body height loss and no significant  retropulsion or posterior element involvement. The bones are diffusely osteopenic. There is a hemangioma in the T2 vertebral body. Paraspinal and other soft tissues: Moderately large sliding hiatal hernia.  Aortic and coronary atherosclerosis. Partially visualized aneurysmal dilatation of the aortic arch measuring 4.8 cm in diameter, similar to the prior chest CT. Small bilateral pleural effusions. Asymmetric patchy opacity posteriorly in the right lower lobe and right upper lobe with assessment limited by respiratory motion artifact and small field of view. Disc levels: Mild for age thoracic disc degeneration. Asymmetrically advanced right-sided facet arthrosis in the upper thoracic and lower cervical spine. No evidence of high-grade spinal canal or neural foraminal stenosis. CT LUMBAR SPINE FINDINGS Segmentation: 5 lumbar type vertebrae. Alignment: Normal. Vertebrae: No acute fracture or suspicious osseous lesion. Similar appearance of multiple Schmorl's nodes. Partial fusion across the L4-5 disc space. Paraspinal and other soft tissues: Aortic atherosclerosis. Unchanged small right adrenal adenoma. Mildly increased size of multiple left para-aortic lymph nodes measuring up to 1.2 cm in short axis and of left common and external iliac lymph nodes measuring up to 1.4 cm in short axis. Disc levels: Disc bulging and posterior element hypertrophy result in severe spinal stenosis at L3-4, moderate to severe spinal stenosis at L2-3, and mild spinal stenosis at L4-5. There is mild-to-moderate multilevel neural foraminal stenosis. IMPRESSION: 1. Recent T4 compression fracture with mild height loss. 2. Lumbar disc and facet degeneration most notable at L3-4 where there is severe spinal stenosis. 3. Small bilateral pleural effusions. 4. Asymmetric patchy pulmonary opacity posteriorly in the right lower lobe and right upper lobe which may reflect atelectasis or pneumonia. 5. Mildly increased left-sided retroperitoneal  lymphadenopathy, nonspecific. 6. Aortic Atherosclerosis (ICD10-I70.0). Electronically Signed   By: Sebastian Ache M.D.   On: 10/12/2020 17:26    Procedures Dr. Roda Shutters (10/08/2020) - Open treatment of intertrochanteric fracture with intramedullary implant; Application of incisional wound vac Dr. Maurice Small (10/08/2020) - Left burr hole drainage of subdural hematoma  Hospital Course:  Nathan Mitchell is an 87yo male PMH atrial fibrillation on xarelto who presented to [facility] 6/9 after fall the day prior.  He was sitting in a chair and tripped while trying to stand up. He has an indwelling Foley and his wife thinks he may have tripped over the tubing. He hit his head on the floor but denies loss of consciousness. On arrival to the ED his chief complaint was hip pain. Workup showed the below listed injuries. Patient was admitted to the trauma service.  TBI/Right SDH  Neurosurgery was consulted and took the patient to the OR for burr hole evacuation by Dr. Maurice Small on 6/9. Drain was removed postoperatively. He was prescribed Keppra for 7 days for seizure prophylaxis. Patient worked with TBI team therapies during admission and progressed well.  Anticoagulation with Xarelto for Atrial fibrillation  This medication was reversed with Kcentra upon presentation to the hospital and held on admission. He will follow up with his cardiologist after discharge to discuss possibly permanently discontinuing this medication.  Right hip Fracture  Orthopedics was consulted and took the patient to the OR for ORIF by Dr. Roda Shutters on 6/9. He was advised WBAT RLE postoperatively.  ABL anemia  Patient did require 1 unit pRBCs on 6/12 for acute blood loss anemia. He had an inappropriate response with hemoglobin rising from 6.8 to 7.2, but no sources of bleeding and asymptomatic. Continued to monitor h/h and hemoglobin remained stable without the need for further transfusion.   T4 fracture  Patient complained of worsening mid-back pain  on 6/13. CT scan was obtained and reported possible T4 fracture. Neurosurgery was consulted and recommended no intervention needed, no brace / no restrictions -  activity as tolerated. Patient's back pain resolved.  Patient worked with therapies during this admission who recommended inpatient rehab. On 6/14, the patient was felt stable for discharge to rehab.  Patient will follow up as below and knows to call with questions or concerns.       Follow-up Information     Tarry Kos, MD Follow up in 2 week(s).   Specialty: Orthopedic Surgery Why: For suture removal, For wound re-check Contact information: [address] [place] [address] [phone]                 Signed: Franne Forts, [facility] 10/13/2020, 3:44 PM Please see Amion for pager number during day hours 7:00am-4:30pm

## 2020-10-14 DIAGNOSIS — K5901 Slow transit constipation: Secondary | ICD-10-CM

## 2020-10-14 DIAGNOSIS — R339 Retention of urine, unspecified: Secondary | ICD-10-CM

## 2020-10-14 DIAGNOSIS — S069X2S Unspecified intracranial injury with loss of consciousness of 31 minutes to 59 minutes, sequela: Secondary | ICD-10-CM

## 2020-10-14 DIAGNOSIS — D62 Acute posthemorrhagic anemia: Secondary | ICD-10-CM

## 2020-10-14 LAB — CBC WITH DIFFERENTIAL/PLATELET
Abs Immature Granulocytes: 0.1 10*3/uL — ABNORMAL HIGH (ref 0.00–0.07)
Basophils Absolute: 0 10*3/uL (ref 0.0–0.1)
Basophils Relative: 0 %
Eosinophils Absolute: 0.1 10*3/uL (ref 0.0–0.5)
Eosinophils Relative: 1 %
HCT: 23.5 % — ABNORMAL LOW (ref 39.0–52.0)
Hemoglobin: 7.5 g/dL — ABNORMAL LOW (ref 13.0–17.0)
Immature Granulocytes: 1 %
Lymphocytes Relative: 10 %
Lymphs Abs: 0.8 10*3/uL (ref 0.7–4.0)
MCH: 29.8 pg (ref 26.0–34.0)
MCHC: 31.9 g/dL (ref 30.0–36.0)
MCV: 93.3 fL (ref 80.0–100.0)
Monocytes Absolute: 0.7 10*3/uL (ref 0.1–1.0)
Monocytes Relative: 8 %
Neutro Abs: 6.8 10*3/uL (ref 1.7–7.7)
Neutrophils Relative %: 80 %
Platelets: 281 10*3/uL (ref 150–400)
RBC: 2.52 MIL/uL — ABNORMAL LOW (ref 4.22–5.81)
RDW: 15.9 % — ABNORMAL HIGH (ref 11.5–15.5)
WBC: 8.6 10*3/uL (ref 4.0–10.5)
nRBC: 0 % (ref 0.0–0.2)

## 2020-10-14 LAB — COMPREHENSIVE METABOLIC PANEL
ALT: 62 U/L — ABNORMAL HIGH (ref 0–44)
AST: 37 U/L (ref 15–41)
Albumin: 2.2 g/dL — ABNORMAL LOW (ref 3.5–5.0)
Alkaline Phosphatase: 99 U/L (ref 38–126)
Anion gap: 8 (ref 5–15)
BUN: 21 mg/dL (ref 8–23)
CO2: 23 mmol/L (ref 22–32)
Calcium: 8.2 mg/dL — ABNORMAL LOW (ref 8.9–10.3)
Chloride: 108 mmol/L (ref 98–111)
Creatinine, Ser: 0.93 mg/dL (ref 0.61–1.24)
GFR, Estimated: >60 mL/min (ref >60)
Glucose, Bld: 110 mg/dL — ABNORMAL HIGH (ref 70–99)
Potassium: 4 mmol/L (ref 3.5–5.1)
Sodium: 139 mmol/L (ref 135–145)
Total Bilirubin: 2.2 mg/dL — ABNORMAL HIGH (ref 0.3–1.2)
Total Protein: 5 g/dL — ABNORMAL LOW (ref 6.5–8.1)

## 2020-10-14 MED ORDER — CHLORHEXIDINE GLUCONATE CLOTH 2 % EX PADS
6.0000 | MEDICATED_PAD | Freq: Every day | CUTANEOUS | Status: DC
  Administered 2020-10-14 – 2020-11-03 (×20): 6 via TOPICAL

## 2020-10-14 NOTE — Progress Notes (Signed)
PMR Admission Coordinator Pre-Admission Assessment  Patient: Nathan Mitchell is an 87 y.o., male MRN: [id number] DOB: 02/03/1931 Height: 6' 1" (185.4 cm) Weight: 78.1 kg  Insurance Information HMO:     PPO: yes     PCP:      IPA:      80/20:      OTHER:  PRIMARY: [employer]      Policy#:[id number]    Subscriber: Pt CM Name: Lorraine Parish      Phone#: [phone] ext [phone]   Fax#: [phone]  Pre-Cert#:[id number] Phone #: [phone]      Name: Loraine Parrish is CM. I received authorization from [place] with [employer] on 6/14 for admission on 6/14 for 5 days with updates due 10/18/20. Eff Date: 05/03/2019- still active Deductible: does not have OOP Max: $4,000 ($498.71 met) CIR: $160/day co-pay with a max co-pay of $1,600/admission (10 days) SNF: 100% coverage Outpatient:  $20/visit co-pay Home Health:  100% coverage DME: 80% coverage; 20% co-insurance  SECONDARY: none      Policy#:      Phone#:    Financial Counselor:       Phone#:   The "Data Collection Information Summary" for patients in Inpatient Rehabilitation Facilities with attached "Privacy Act Statement-Health Care Records" was provided and verbally reviewed with: Patient  Emergency Contact Information Contact Information     Name Relation Home Work Mobile   Robison,Katherine (Kitty) Spouse [phone]  [phone]   Kantlehenr, Margaret Daughter   [phone]   Carter, Ann -Barton Daughter   [phone]   Davey, Katherine Daughter   [phone]      Current Medical History  Patient Admitting Diagnosis: L SDH and R hip fx  HPI: This is a 87 y.o. man that presented to ED 10/08/2020 after a mechanical fall. He has a history of GCA w/ resulting blindness, has an indwelling foley that his wife suspects he tripped over. Since the fall, he has been worse than his cognitive baseline, wife describes him as 'goofy' as well as severe right hip pain. He denies any headaches / N / V, he was  on rivaroxaban for PAF, which was reversed w/ PCC prior to transfer. Imaging revealed R hip fx and L SDH.  s/p IM nail of R femur and burr hole placement on 6/9. CIR was consulted to assist in return to PLOF.  Patient's medical record from [facility] has been reviewed by the rehabilitation admission coordinator and physician.  Past Medical History  Past Medical History:  Diagnosis Date   Aortic atherosclerosis (HCC)    Atrial fibrillation (HCC)    Giant cell arteritis (HCC)    Osteopenia    Pure hypercholesterolemia    Thoracic aortic aneurysm (HCC)    Thrombophilia (HCC)     Family History   family history is not on file.  Prior Rehab/Hospitalizations Has the patient had prior rehab or hospitalizations prior to admission? Yes  Has the patient had major surgery during 100 days prior to admission? Yes   Current Medications  Current Facility-Administered Medications:    0.9 %  sodium chloride infusion, , Intravenous, Continuous, Thompson, Burke, MD, Last Rate: 10 mL/hr at 10/11/20 0600, Infusion Verify at 10/11/20 0600   acetaminophen (TYLENOL) tablet 650 mg, 650 mg, Oral, Q6H PRN, 650 mg at 10/08/20 0540 **OR** acetaminophen (TYLENOL) suppository 650 mg, 650 mg, Rectal, Q6H PRN, Xu, Naiping M, MD   atorvastatin (LIPITOR) tablet 10 mg, 10 mg, Oral, Daily, Xu, Naiping M,   MD, 10 mg at 10/11/20 1114   Chlorhexidine Gluconate Cloth 2 % PADS 6 each, 6 each, Topical, Daily, Xu, Naiping M, MD, 6 each at 10/10/20 0907   enoxaparin (LOVENOX) injection 40 mg, 40 mg, Subcutaneous, Daily, Ramirez, Armando, MD, 40 mg at 10/11/20 1120   escitalopram (LEXAPRO) tablet 20 mg, 20 mg, Oral, Daily, Xu, Naiping M, MD, 20 mg at 10/11/20 1114   folic acid (FOLVITE) tablet 0.5 mg, 500 mcg, Oral, Daily, Xu, Naiping M, MD, 0.5 mg at 10/11/20 1114   HYDROmorphone (DILAUDID) injection 0.5 mg, 0.5 mg, Intravenous, Q4H PRN, Xu, Naiping M, MD, 0.5 mg at 10/09/20 2347   levETIRAcetam (KEPPRA) IVPB  500 mg/100 mL premix, 500 mg, Intravenous, Q12H, Xu, Naiping M, MD, Last Rate: 400 mL/hr at 10/11/20 1121, 500 mg at 10/11/20 1121   MEDLINE mouth rinse, 15 mL, Mouth Rinse, BID, Thompson, Burke, MD, 15 mL at 10/10/20 2117   methocarbamol (ROBAXIN) tablet 500 mg, 500 mg, Oral, Q8H PRN, Xu, Naiping M, MD, 500 mg at 10/11/20 1113   ondansetron (ZOFRAN) tablet 4 mg, 4 mg, Oral, Q6H PRN **OR** ondansetron (ZOFRAN) injection 4 mg, 4 mg, Intravenous, Q6H PRN, Xu, Naiping M, MD, 4 mg at 10/09/20 0328   oxyCODONE (Oxy IR/ROXICODONE) immediate release tablet 5 mg, 5 mg, Oral, Q4H PRN, Xu, Naiping M, MD, 5 mg at 10/11/20 0248   pantoprazole (PROTONIX) EC tablet 40 mg, 40 mg, Oral, Daily, Xu, Naiping M, MD, 40 mg at 10/11/20 1114   polyethylene glycol (MIRALAX / GLYCOLAX) packet 17 g, 17 g, Oral, Daily PRN, Xu, Naiping M, MD, 17 g at 10/10/20 0932   tamsulosin (FLOMAX) capsule 0.8 mg, 0.8 mg, Oral, QPC supper, Xu, Naiping M, MD, 0.8 mg at 10/10/20 1718  Patients Current Diet:  Diet Order             Diet full liquid Room service appropriate? Yes; Fluid consistency: Thin  Diet effective now                   Precautions / Restrictions Precautions Precautions: Fall Precaution Comments: ventric (drain to gravity, does not need to be clamped), foley, bil mittens, blind L eye ~20% R eye, wound vac R hip Restrictions Weight Bearing Restrictions: Yes RLE Weight Bearing: Weight bearing as tolerated   Has the patient had 2 or more falls or a fall with injury in the past year? Yes  Prior Activity Level Limited Community (1-2x/wk): Pt. went out for appointments  Prior Functional Level Self Care: Did the patient need help bathing, dressing, using the toilet or eating? Needed some help  Indoor Mobility: Did the patient need assistance with walking from room to room (with or without device)? Independent  Stairs: Did the patient need assistance with internal or external stairs (with or without device)?  Needed some help  Functional Cognition: Did the patient need help planning regular tasks such as shopping or remembering to take medications? Dependent  Home Assistive Devices / Equipment Home Assistive Devices/Equipment: Other (Comment) (foley catheter per pt's wife) Home Equipment: Cane - single point, Shower seat, Grab bars - tub/shower, Grab bars - toilet  Prior Device Use: Indicate devices/aids used by the patient prior to current illness, exacerbation or injury? None of the above  Current Functional Level Cognition  Arousal/Alertness: Awake/alert Overall Cognitive Status: Impaired/Different from baseline Orientation Level: Oriented to person, Oriented to place, Oriented to situation, Disoriented to time Following Commands: Follows multi-step commands inconsistently, Follows multi-step commands with increased time   Safety/Judgement: Decreased awareness of safety, Decreased awareness of deficits General Comments: RN notified PT that 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 Pt needing multi-modal cues and increased time to process as pt displays some anxiety with mobility. Pt able to initiate tasks but becomes resistive when he experiences pain or anxiety with mobility. Pt with decreased deficits and safety awareness, reaching for a pole on wheels for stability despite max cues to hold onto more steady therapist. Pt became agitated with increased mobility thus ended session per pt request. Memory: Impaired Memory Impairment: Retrieval deficit, Decreased short term memory Decreased Short Term Memory: Verbal basic, Functional basic Awareness: Impaired Awareness Impairment: Emergent impairment, Intellectual impairment Problem Solving: Impaired Problem Solving Impairment: Verbal basic, Functional basic Executive Function: Reasoning, Decision Making Reasoning: Impaired Reasoning Impairment: Verbal basic, Functional basic Decision Making: Impaired Decision Making Impairment: Verbal basic,  Functional basic Safety/Judgment: Impaired    Extremity Assessment (includes Sensation/Coordination)  Upper Extremity Assessment: Defer to OT evaluation  Lower Extremity Assessment: RLE deficits/detail, LLE deficits/detail RLE Deficits / Details: limited by significant pain in thigh/hip from fx. able to activate quad in gravity minimized position, full ROM at ankle. hx of neuropathy in bilateral LE RLE: Unable to fully assess due to pain RLE Sensation: history of peripheral neuropathy LLE Deficits / Details: history of neruopathy to mid-calf bilaterally, able to complete full ROM at ankle, knee, and hip against gravity LLE Sensation: history of peripheral neuropathy LLE Coordination: decreased fine motor, decreased gross motor    ADLs  Overall ADL's : Needs assistance/impaired Eating/Feeding: Minimal assistance, Bed level Lower Body Bathing: Total assistance Lower Body Dressing: Total assistance Toilet Transfer: +2 for physical assistance, Maximal assistance General ADL Comments: pt with short cord for the EVD so exiting the bed in egress used to help achieve standing.    Mobility  Overal bed mobility: Needs Assistance General bed mobility comments: use of bed egress to chair to complete bed mobility due to limited length of ventricular tubing and pt pain levels. modA to pull trunk forward initially, pt able to assist some with use of bed rails    Transfers  Overall transfer level: Needs assistance Equipment used: 2 person hand held assist Transfers: Sit to/from Stand Sit to Stand: +2 physical assistance, Mod assist General transfer comment: Pt pushing up from rails first rep and then holding onto arms of therapist and tech for second rep from egress bed position, modAx2 to power up and steady with bil knee block.    Ambulation / Gait / Stairs / Wheelchair Mobility  Ambulation/Gait Ambulation/Gait assistance: Mod assist, Max assist, +2 physical assistance Assistive device: 2  person hand held assist General Gait Details: Pre-gait training, cuing pt to shift weight laterally between feet first standing bout modAx2 and then attempt to march in place 2nd bout maxAx2 as pt's knees would buckle and needed blocking. Pt then became agitated and therefore ended session per pt request.    Posture / Balance Dynamic Sitting Balance Sitting balance - Comments: Bil UEs on rails of bed to sit in egress position. Balance Overall balance assessment: Needs assistance Sitting-balance support: Bilateral upper extremity supported Sitting balance-Leahy Scale: Poor Sitting balance - Comments: Bil UEs on rails of bed to sit in egress position. Standing balance support: Bilateral upper extremity supported, During functional activity Standing balance-Leahy Scale: Poor Standing balance comment: depending on UE support and therapist    Special needs/care consideration Skin: surgical incision to head, abrasion to R hip and groin   and Special service needs: Blind at baseline   Previous Home Environment (from acute therapy documentation) Living Arrangements: Spouse/significant other  Lives With: Spouse Available Help at Discharge: Family, Available 24 hours/day Type of Home: House Home Layout: Two level, Able to live on main level with bedroom/bathroom Home Access: Level entry Bathroom Shower/Tub: Walk-in shower Bathroom Toilet: Standard Home Care Services: Yes Type of Home Care Services: Homehealth aide Home Care Agency (if known): private Additional Comments: has 4 children in addition to wife (A)  Discharge Living Setting Plans for Discharge Living Setting: Patient's home Type of Home at Discharge: House Discharge Home Layout: Two level, Able to live on main level with bedroom/bathroom Discharge Home Access: Level entry Discharge Bathroom Shower/Tub: Walk-in shower Discharge Bathroom Toilet: Standard Discharge Bathroom Accessibility: Yes How Accessible: Accessible via  wheelchair, Accessible via walker Does the patient have any problems obtaining your medications?: No  Social/Family/Support Systems Patient Roles: Spouse Contact Information: [phone] Anticipated Caregiver: Katherine Robinson Anticipated Caregiver's Contact Information: [phone] Ability/Limitations of Caregiver: Can provide supervision-min A, has3 daughters who can assist and an hire help Caregiver Availability: 24/7 Discharge Plan Discussed with Primary Caregiver: Yes Is Caregiver In Agreement with Plan?: Yes Does Caregiver/Family have Issues with Lodging/Transportation while Pt is in Rehab?: No  Goals Patient/Family Goal for Rehab: PT/OT/SLP mod A Expected length of stay: 24-27 days Pt/Family Agrees to Admission and willing to participate: Yes Program Orientation Provided & Reviewed with Pt/Caregiver Including Roles  & Responsibilities: Yes  Decrease burden of Care through IP rehab admission: Specialzed equipment needs, Diet advancement, Decrease number of caregivers, Bowel and bladder program, and Patient/family education  Possible need for SNF placement upon discharge: not anticipated  Patient Condition: I have reviewed medical records from [facility], spoken with CM, and patient and spouse. I met with patient at the bedside for inpatient rehabilitation assessment.  Patient will benefit from ongoing PT, OT, and SLP, can actively participate in 3 hours of therapy a day 5 days of the week, and can make measurable gains during the admission.  Patient will also benefit from the coordinated team approach during an Inpatient Acute Rehabilitation admission.  The patient will receive intensive therapy as well as Rehabilitation physician, nursing, social worker, and care management interventions.  Due to bladder management, bowel management, safety, skin/wound care, disease management, medication administration, pain management, and patient education the patient requires  24 hour a day rehabilitation nursing.  The patient is currently mod with mobility and basic ADLs.  Discharge setting and therapy post discharge at home with home health is anticipated.  Patient has agreed to participate in the Acute Inpatient Rehabilitation Program and will admit today.  Preadmission Screen Completed By:  Eilleen Davoli B Azaryah Heathcock, 10/11/2020 12:27 PM ______________________________________________________________________   Discussed status with Dr. Raulkar on 10/13/2020  at 245 and received approval for admission today.  Admission Coordinator:  Tuan Tippin B Gavin Telford, CCC-SLP, time 245 /Date 10/13/2020.   Assessment/Plan: Diagnosis: Closed right hip fracture Does the need for close, 24 hr/day Medical supervision in concert with the patient's rehab needs make it unreasonable for this patient to be served in a less intensive setting? Yes Co-Morbidities requiring supervision/potential complications: Subdural hematoma, acute pain due to trauma, abnormal gait, aortic regurgitation, aortic root dilation Due to bladder management, bowel management, safety, skin/wound care, disease management, medication administration, pain management, and patient education, does the patient require 24 hr/day rehab nursing? Yes Does the patient require coordinated care of a physician, rehab nurse, PT, OT, and SLP to   Does the need for close, 24 hr/day Medical supervision in concert with the patient's rehab needs make it unreasonable for this patient to be served in a less intensive setting? Yes Co-Morbidities requiring supervision/potential complications: Subdural hematoma, acute pain due to trauma, abnormal gait, aortic regurgitation, aortic root dilation Due to bladder management, bowel management, safety, skin/wound care, disease management, medication administration, pain management, and patient education, does the patient require 24 hr/day rehab nursing? Yes Does the patient require coordinated care of a physician, rehab nurse, PT, OT, and SLP to address physical and functional deficits in the context of the above medical diagnosis(es)? Yes Addressing deficits in the following areas: balance, endurance, locomotion, strength, transferring, bowel/bladder control, bathing, dressing, feeding, grooming, toileting, cognition, and psychosocial support Can the patient actively participate in an intensive therapy program of at least 3 hrs of therapy 5 days a week? Yes The potential for patient to make measurable gains while on inpatient rehab is excellent Anticipated functional  outcomes upon discharge from inpatient rehab: min assist PT, min assist OT, supervision SLP Estimated rehab length of stay to reach the above functional goals is: 2-3 weeks Anticipated discharge destination: Home 10. Overall Rehab/Functional Prognosis: good     MD Signature: Leeroy Cha, MD

## 2020-10-14 NOTE — Progress Notes (Signed)
Inpatient Rehabilitation  Patient information reviewed and entered into eRehab system by Shayona Hibbitts [place] Latulippe, OTR/L.   Information including medical coding, functional ability and quality indicators will be reviewed and updated through discharge.    

## 2020-10-14 NOTE — Progress Notes (Signed)
Inpatient Rehabilitation Medication Review by a Pharmacist  A complete drug regimen review was completed for this patient to identify any potential clinically significant medication issues.  Clinically significant medication issues were identified:  yes   Type of Medication Issue Identified Description of Issue Urgent (address now) Non-Urgent (address on AM team rounds) Plan   Drug Interaction(s) (clinically significant)       Duplicate Therapy       Allergy       No Medication Administration End Date       Incorrect Dose       Additional Drug Therapy Needed  Held Atorvastatin 10mg    Held Methotrexate 20mg  PO Q Friday for Giant cell arteritis  If constipation unresolved, consider mag citrate or lactulose Non-urgent Restart atorvastatin  Consider restarting methotrexate when Hgb > 8    Other  Folic acid 1mg  PTA decreased to 0.5mg    Ferrous gluconate QMWF and Vitamin B 12 held  Non-urgent Consider restarting and adjusting dose      Name of provider notified for urgent issues identified:   Provider Method of Notification: Pam Love    For non-urgent medication issues to be resolved on team rounds tomorrow morning a [facility] Secure Chat Handoff was sent to:      Time spent performing this drug regimen review (minutes):  15 min    , PharmD, BCPS, [facility] Clinical Pharmacist  Please check AMION for all [facility] Pharmacy phone numbers After 10:00 PM, call Main Pharmacy [phone]

## 2020-10-14 NOTE — Evaluation (Signed)
Physical Therapy Assessment and Plan  Patient Details  Name: Nathan Mitchell MRN: [id number] Date of Birth: 03/02/1931  PT Diagnosis: Abnormal posture, Abnormality of gait, Cognitive deficits, Difficulty walking, Edema, Muscle spasms, Muscle weakness, and Pain in joint Rehab Potential: Good ELOS: 3 weeks   Today's Date: 10/14/2020 PT Individual Time: 1110-1205 PT Individual Time Calculation (min): 55 min    Hospital Problem: Principal Problem:   TBI (traumatic brain injury) [facility])   Past Medical History:  Past Medical History:  Diagnosis Date   Aortic atherosclerosis ([place])    Atrial fibrillation ([place])    Giant cell arteritis ([place])    Osteopenia    Pure hypercholesterolemia    Thoracic aortic aneurysm ([place])    Thrombophilia ([place])    Past Surgical History:  Past Surgical History:  Procedure Laterality Date   APPENDECTOMY     CRANIOTOMY Left 10/08/2020   Procedure: LEFT BURR HOLE DRAINAGE OF SUBDURAL HEMATOMA;  Surgeon: Judith Part, MD;  Location: [place];  Service: Neurosurgery;  Laterality: Left;   HERNIA REPAIR     INTRAMEDULLARY (IM) NAIL INTERTROCHANTERIC Right 10/08/2020   Procedure: RIGHT INTRAMEDULLARY (IM) NAIL INTERTROCHANTRIC WITH PREVENA WOUND VAC APPLICATION;  Surgeon: Leandrew Koyanagi, MD;  Location: [place];  Service: Orthopedics;  Laterality: Right;    Assessment & Plan Clinical Impression: Patient is a 87 y.o. year old male with history of CAF,- on Xarelto giant cell arteritis, thoracic aneurysm, recent BOO (foley placed 5/22) who was admitted on 10/08/20 after a tripping over his foley catheter and falling while getting up from a table at home. He was found to have large left SDH overlying left cerebral convexity with significant mass effect upon left hemisphere with effacement of left lateral ventricle and 7 mm left to right midline shift as well as severely comminuted and displaced proximal right IT femur fracture. Xarelto reversed with Kcentra and he was  taken to OR for left burr hole evacuation of SDH with placement of ventriculostomy drain by Dr. Zada Finders as well as ORIF right hip by Dr. Erlinda Hong. Post op WBAT and continues to have issues with confusion. Subdural drain removed on 06/12 and hs reported issues with back pain.  He has received 2 units PRBC for ABLA.   CT thoracolumbar spine done 06/13 (due to extensive bruising and pain with activity) and revealed recent T4 compression fracture with severe spinal stenosis L3/4 and asymmetric patchy pulmonary opacities RLL and RUE question atelectasis v/s PNA.  He continues on Keppra for seizure prophylaxis.   Dr. Zada Finders reviewed back films and felt no intervention or brace needed.  Therapy ongoing and patient limited by confusion, cognitive deficits, premorbid visual deficits, delayed processing. Wife at bedside. Difficulty elevating right leg due to pain. Patient transferred to CIR on 10/13/2020 .   Patient currently requires mod with mobility secondary to muscle weakness and muscle joint tightness, decreased cardiorespiratoy endurance, decreased coordination and decreased motor planning, decreased visual acuity and decreased visual perceptual skills, decreased attention to left and decreased motor planning, decreased memory, and decreased sitting balance, decreased standing balance, decreased postural control, and decreased balance strategies.  Prior to hospitalization, patient was independent  with mobility and lived with Spouse in a House home.  Home access is  Level entry.  Patient will benefit from skilled PT intervention to maximize safe functional mobility, minimize fall risk, and decrease caregiver burden for planned discharge home with 24 hour supervision.  Anticipate patient will benefit from follow up [place] at discharge.  PT - End of Session Activity Tolerance: Tolerates 10 - 20 min activity with multiple rests Endurance Deficit: Yes PT Assessment Rehab Potential (ACUTE/IP ONLY): Good PT Barriers  to Discharge: Home environment access/layout;Weight bearing restrictions;Behavior PT Patient demonstrates impairments in the following area(s): Balance;Perception;Safety;Behavior;Edema;Sensory;Endurance;Motor;Nutrition;Skin Integrity PT Transfers Functional Problem(s): Bed Mobility;Bed to Chair;Car;Furniture PT Locomotion Functional Problem(s): Ambulation;Wheelchair Mobility;Stairs PT Plan PT Intensity: Minimum of 1-2 x/day ,45 to 90 minutes PT Frequency: 5 out of 7 days PT Duration Estimated Length of Stay: 3 weeks PT Treatment/Interventions: Ambulation/gait training;Cognitive remediation/compensation;Discharge planning;DME/adaptive equipment instruction;Functional mobility training;Pain management;Psychosocial support;Splinting/orthotics;Therapeutic Activities;UE/LE Strength taining/ROM;Visual/perceptual remediation/compensation;Wheelchair propulsion/positioning;UE/LE Coordination activities;Therapeutic Exercise;Stair training;Skin care/wound management;Patient/family education;Neuromuscular re-education;Functional electrical stimulation;Disease management/prevention;Community reintegration;Balance/vestibular training PT Transfers Anticipated Outcome(s): supervision using LRAD PT Locomotion Anticipated Outcome(s): Supervision using LRAD 100 ft PT Recommendation Recommendations for Other Services: Neuropsych consult Follow Up Recommendations: Home health PT Patient destination: Home Equipment Recommended: Wheelchair (measurements);Rolling walker with 5" wheels Equipment Details: will provide w/c measurements when able   PT Evaluation Precautions/Restrictions Precautions Precautions: Fall Precaution Comments: foley, T4 compression fx (no brace) Restrictions RLE Weight Bearing: Weight bearing as tolerated General   Vital Signs  Pain Pain Assessment Pain Scale: 0-10 Pain Score: 0-No pain Home Living/Prior Functioning Home Living Available Help at Discharge: Family;Available 24  hours/day Type of Home: House Home Access: Level entry Home Layout: Two level;Able to live on main level with bedroom/bathroom Additional Comments: has 4 children in addition to wife (A)  Lives With: Spouse Prior Function Vocation: Retired Vision/Perception  Vision - Assessment Additional Comments: sees only out of R eye with 20% peripheral vision at baseling, reports seeing large shapes and shadows Perception Perception: Impaired Inattention/Neglect: Does not attend to left side of body Praxis Praxis: Impaired Praxis Impairment Details: Motor planning  Cognition Overall Cognitive Status: Impaired/Different from baseline Arousal/Alertness: Awake/alert Orientation Level: Oriented to person;Oriented to place;Oriented to time;Oriented to situation Memory: Impaired Memory Impairment: Retrieval deficit;Decreased short term memory Decreased Short Term Memory: Verbal basic;Functional basic Awareness: Impaired Awareness Impairment: Emergent impairment;Intellectual impairment Safety/Judgment: Impaired [employer] Scales of Cognitive Functioning: Automatic/appropriate Sensation Sensation Light Touch: Appears Intact (patient's wife reporte peripheral neuropathy at baseline, patient able to identify light touch with testing) Proprioception: Impaired by gross assessment Coordination Gross Motor Movements are Fluid and Coordinated: No Fine Motor Movements are Fluid and Coordinated: No Coordination and Movement Description: pain guarding with R LE movements. Motor  Motor Motor: Abnormal postural alignment and control   Trunk/Postural Assessment  Cervical Assessment Cervical Assessment:  (head forward) Thoracic Assessment Thoracic Assessment:  (rounded shoudlers) Lumbar Assessment Lumbar Assessment:  (post pelvic tilt) Postural Control Postural Control: Deficits on evaluation  Balance Balance Balance Assessed: Yes Dynamic Sitting Balance Dynamic Sitting - Level of  Assistance: 4: Min assist Sitting balance - Comments: posterior bias and shifting off R hip Static Standing Balance Static Standing - Level of Assistance: 3: Mod assist Extremity Assessment  RUE Assessment General Strength Comments: generalized weakness LUE Assessment General Strength Comments: generalized weakness >RUE RLE Assessment RLE Assessment: Exceptions to [facility] Active Range of Motion (AROM) Comments: very limity hip ROM in all directions able to sit with hip flexed to 90 degress General Strength Comments: limited testing due to R hip pain, grossly at least 2/5 throughout LLE Assessment LLE Assessment: Within Functional Limits Active Range of Motion (AROM) Comments: WFL General Strength Comments: 5/5 throughout in sitting  Care Tool Care Tool Bed Mobility Roll left and right activity Roll left and right activity did not occur: Safety/medical concerns (unable due  to elevated R hip pain)      Sit to lying activity   Sit to lying assist level: Minimal Assistance - Patient > 75%    Lying to sitting edge of bed activity   Lying to sitting edge of bed assist level: Moderate Assistance - Patient 50 - 74%     Care Tool Transfers Sit to stand transfer   Sit to stand assist level: Moderate Assistance - Patient 50 - 74% Sit to stand assistive device: Walker  Chair/bed transfer   Chair/bed transfer assist level: Moderate Assistance - Patient 50 - 74% Chair/bed transfer assistive device: Barrister's clerk transfer   Assist Level: Dependent - Patient 0%    Scientist, product/process development transfer activity did not occur: Safety/medical concerns (Limited by increased R hip pain)        Care Tool Locomotion Ambulation   Assist level: Moderate Assistance - Patient 50 - 74% Assistive device: Walker-rolling Max distance: 4 ft  Walk 10 feet activity Walk 10 feet activity did not occur: Safety/medical concerns (Limited by increased R hip pain)       Walk 50 feet with 2 turns activity Walk 50 feet  with 2 turns activity did not occur: Safety/medical concerns      Walk 150 feet activity Walk 150 feet activity did not occur: Safety/medical concerns      Walk 10 feet on uneven surfaces activity Walk 10 feet on uneven surfaces activity did not occur: Safety/medical concerns      Stairs Stair activity did not occur: Safety/medical concerns        Walk up/down 1 step activity Walk up/down 1 step or curb (drop down) activity did not occur: Safety/medical concerns     Walk up/down 4 steps activity did not occuR: Safety/medical concerns  Walk up/down 4 steps activity      Walk up/down 12 steps activity Walk up/down 12 steps activity did not occur: Safety/medical concerns      Pick up small objects from floor Pick up small object from the floor (from standing position) activity did not occur: Safety/medical concerns      Wheelchair     Wheelchair activity did not occur: Safety/medical concerns (Limited by increased R hip pain during evaluation)      Wheel 50 feet with 2 turns activity Wheelchair 50 feet with 2 turns activity did not occur: Safety/medical concerns    Wheel 150 feet activity Wheelchair 150 feet activity did not occur: Safety/medical concerns      Refer to Care Plan for Long Term Goals  SHORT TERM GOAL WEEK 1 PT Short Term Goal 1 (Week 1): Patient will perform bed mobility with min A consistently. PT Short Term Goal 2 (Week 1): Patient will perform basic transfers with min A consistently. PT Short Term Goal 3 (Week 1): Patient will ambulate >25 ft using LRAD. PT Short Term Goal 4 (Week 1): Patient will propel w/c >100 ft  Recommendations for other services: Neuropsych  Skilled Therapeutic Intervention Evaluation completed (see details above and below) with education on PT POC and goals and individual treatment initiated with focus on functional mobility/transfers, LE strength, dynamic standing balance/coordination, ambulation, stair navigation, simulated car  transfers, and improved endurance with activity Patient provided with 20"x18" wheelchair with standard cushion and adjustments made to promote optimal seating posture and pressure distribution. Patient also provided with RW for use in room and therapist adjusted to proper height for patient.  Patient in recliner in the room upon PT arrival. Patient  alert and agreeable to PT session. Patient reported 6-7/10 R hip pain during session, RN made aware. PT provided repositioning, rest breaks, and distraction as pain interventions throughout session.   Patient's wife arrived at beginning of session and assisted with confirming home set-up.   Therapeutic Activity: Bed Mobility: Patient performed supine to/from sit with min-mod A. Provided verbal cues for log roll technique to protect T4 fracture and assist for L leg and trunk to reduce pain with mobility. Transfers: Patient performed sit to/from stand x2 with mod A from recliner and min A from an elevated bed with increased time for forward weight shift due to R hip pain. Provided verbal cues for hand placement, forward weight shift, and pushing up/reaching back with L hand to reduce weight on R foot due to pain. Patient performed stand pivot bed<>w/c with mod A using RW. Provided cues for pivot technique to reduce pain with R lower extremity weight bearing.   Gait Training:  Patient ambulated 4 feet using RW from recliner to bed with mod A. Ambulated with antalgic gait on R, crying out with weight bearing, step-to gait pattern leading with R. Provided verbal cues for sequencing and use of upper extremities to manage pain with R weight bearing.  Wheelchair Mobility:  Patient was in significant pain seated in the w/c and requesting to return to bed prior to being able to assess w/c propulsion.  Instructed patient and his wife in results of PT evaluation as detailed above, PT POC, rehab potential, rehab goals, and discharge recommendations. Additionally  discussed CIR's policies regarding fall safety and use of chair alarm and/or quick release belt. Patient and his wife verbalized understanding and in agreement.   Patient in bed with his wife at bedside at end of session with breaks locked, bed alarm set, and all needs within reach.    Discharge Criteria: Patient will be discharged from PT if patient refuses treatment 3 consecutive times without medical reason, if treatment goals not met, if there is a change in medical status, if patient makes no progress towards goals or if patient is discharged from hospital.  The above assessment, treatment plan, treatment alternatives and goals were discussed and mutually agreed upon: by patient and by family  Doreene Burke PT, DPT  10/14/2020, 12:49 PM

## 2020-10-14 NOTE — Evaluation (Signed)
Speech Language Pathology Assessment and Plan  Patient Details  Name: Nathan Mitchell MRN: [id number] Date of Birth: [birth year]  SLP Diagnosis: Cognitive Impairments  Rehab Potential: Excellent ELOS: 3-3.5 weeks    Today's Date: 10/14/2020 SLP Individual Time: 1601-0932 Time Calculation: 47 mins     Hospital Problem: Principal Problem:   TBI (traumatic brain injury) [facility])  Past Medical History:  Past Medical History:  Diagnosis Date   Aortic atherosclerosis ([place])    Atrial fibrillation ([place])    Giant cell arteritis ([place])    Osteopenia    Pure hypercholesterolemia    Thoracic aortic aneurysm ([place])    Thrombophilia ([place])    Past Surgical History:  Past Surgical History:  Procedure Laterality Date   APPENDECTOMY     CRANIOTOMY Left 10/08/2020   Procedure: LEFT BURR HOLE DRAINAGE OF SUBDURAL HEMATOMA;  Surgeon: Judith Part, MD;  Location: [place];  Service: Neurosurgery;  Laterality: Left;   HERNIA REPAIR     INTRAMEDULLARY (IM) NAIL INTERTROCHANTERIC Right 10/08/2020   Procedure: RIGHT INTRAMEDULLARY (IM) NAIL INTERTROCHANTRIC WITH PREVENA WOUND VAC APPLICATION;  Surgeon: Leandrew Koyanagi, MD;  Location: [place];  Service: Orthopedics;  Laterality: Right;    Assessment / Plan / Recommendation Clinical Impression Patient is an 87 year old male admitted on 10/08/20 after a tripping over his foley catheter and falling while getting up from a table at home. He was found to have large left SDH overlying left cerebral convexity with significant mass effect upon left hemisphere with effacement of left lateral ventricle and 7 mm left to right midline shift as well as severely comminuted and displaced proximal right IT femur fracture. Xarelto reversed with Kcentra and he was taken to OR for left burr hole evacuation of SDH with placement of ventriculostomy drain by Dr. Zada Finders as well as ORIF right hip by Dr. Erlinda Hong. Post op WBAT and continues to have issues with confusion. Subdural  drain removed on 06/12 and hs reported issues with back pain.   CT thoracolumbar spine done 06/13 (due to extensive bruising and pain with activity) and revealed recent T4 compression fracture with severe spinal stenosis L3/4 and asymmetric patchy pulmonary opacities RLL and RUE question atelectasis v/s PNA.   Therapy ongoing and patient limited by confusion, cognitive deficits, premorbid visual deficits, delayed processing. CIR recommended and patient admitted 10/13/20.  Patient demonstrates mild cognitive impairments characterized by decreased intellectual awareness of cognitive deficits and decreased working memory and recall of functional information. Patient's overall receptive and expressive language appeared [facility] for all tasks assessed. SLP administered the [facility] and scored 16/22 points with a score of 18 or above considered normal. Patient's family was not present to confirm baseline level of cognitive functioning but patient reports his memory was fairly intact prior to admission. Patient would benefit from skilled SLP intervention to maximize his cognitive functioning prior to discharge.    Skilled Therapeutic Interventions          Administered a cognitive-linguistic evaluation, please see above for details.   SLP Assessment  Patient will need skilled [place] [facility] during CIR admission    Recommendations  Patient destination: Home Follow up Recommendations: 24 hour supervision/assistance Equipment Recommended: None recommended by SLP    SLP Frequency     SLP Duration  SLP Intensity  SLP Treatment/Interventions 3-3.5 weeks  Minumum of 1-2 x/day, 30 to 90 minutes  Cognitive remediation/compensation;Environmental controls;Cueing hierarchy;Functional tasks;Patient/family education;Therapeutic Activities    Pain No/Denies Pain  Prior Functioning Type  of Home: House  Lives With: Spouse Available Help at Discharge: Family;Available 24 hours/day Vocation:  Retired  Programmer, systems Overall Cognitive Status: Impaired/Different from baseline Arousal/Alertness: Awake/alert Orientation Level: Oriented X4 Memory: Impaired Memory Impairment: Decreased recall of new information Decreased Short Term Memory: Verbal basic;Functional basic Awareness: Impaired Awareness Impairment: Emergent impairment Problem Solving: Impaired Safety/Judgment: Impaired [employer] Scales of Cognitive Functioning: Automatic/appropriate  Comprehension Auditory Comprehension Overall Auditory Comprehension: Appears within functional limits for tasks assessed Expression Expression Primary Mode of Expression: Verbal Verbal Expression Overall Verbal Expression: Appears within functional limits for tasks assessed Oral Motor Oral Motor/Sensory Function Overall Oral Motor/Sensory Function: Within functional limits Motor Speech Overall Motor Speech: Appears within functional limits for tasks assessed   Short Term Goals: Week 1: SLP Short Term Goal 1 (Week 1): Patient will recall daily, functional information with Min verbal cues for use of strategies. SLP Short Term Goal 2 (Week 1): Patient will identify 1 cognitive deficit since admission with Min verbal cues.  Refer to Care Plan for Long Term Goals  Recommendations for other services: None   Discharge Criteria: Patient will be discharged from SLP if patient refuses treatment 3 consecutive times without medical reason, if treatment goals not met, if there is a change in medical status, if patient makes no progress towards goals or if patient is discharged from hospital.  The above assessment, treatment plan, treatment alternatives and goals were discussed and mutually agreed upon: by patient  Talayeh Bruinsma 10/14/2020, 3:28 PM

## 2020-10-14 NOTE — Evaluation (Signed)
Occupational Therapy Assessment and Plan  Patient Details  Name: Nathan Mitchell MRN: [id number] Date of Birth: 1930-10-22  OT Diagnosis: abnormal posture, acute pain, cognitive deficits, disturbance of vision, muscle weakness (generalized), pain in joint, and swelling of limb Rehab Potential: Rehab Potential (ACUTE ONLY): Good ELOS: 3-3.5 weeks   Today's Date: 10/14/2020 OT Individual Time: 0800-0900 OT Individual Time Calculation (min): 60 min     Hospital Problem: Principal Problem:   TBI (traumatic brain injury) ([place])   Past Medical History:  Past Medical History:  Diagnosis Date   Aortic atherosclerosis ([place])    Atrial fibrillation ([place])    Giant cell arteritis ([place])    Osteopenia    Pure hypercholesterolemia    Thoracic aortic aneurysm ([place])    Thrombophilia ([place])    Past Surgical History:  Past Surgical History:  Procedure Laterality Date   APPENDECTOMY     CRANIOTOMY Left 10/08/2020   Procedure: LEFT BURR HOLE DRAINAGE OF SUBDURAL HEMATOMA;  Surgeon: Judith Part, MD;  Location: [facility];  Service: Neurosurgery;  Laterality: Left;   HERNIA REPAIR     INTRAMEDULLARY (IM) NAIL INTERTROCHANTERIC Right 10/08/2020   Procedure: RIGHT INTRAMEDULLARY (IM) NAIL INTERTROCHANTRIC WITH PREVENA WOUND VAC APPLICATION;  Surgeon: Leandrew Koyanagi, MD;  Location: [facility];  Service: Orthopedics;  Laterality: Right;    Assessment & Plan Clinical Impression: Nathan Mitchell. Nathan Mitchell is an 87 year old male with history of CAF,- on Xarelto giant cell arteritis, thoracic aneurysm, recent BOO (foley placed 5/22) who was admitted on 10/08/20 after a tripping over his foley catheter and falling while getting up from a table at home. He was found to have large left SDH overlying left cerebral convexity with significant mass effect upon left hemisphere with effacement of left lateral ventricle and 7 mm left to right midline shift as well as severely comminuted and displaced proximal right IT femur  fracture. Xarelto reversed with Kcentra and he was taken to OR for left burr hole evacuation of SDH with placement of ventriculostomy drain by Dr. Zada Finders as well as ORIF right hip by Dr. Erlinda Hong. Post op WBAT and continues to have issues with confusion. Subdural drain removed on 06/12 and hs reported issues with back pain.  He has received 2 units PRBC for ABLA.   CT thoracolumbar spine done 06/13 (due to extensive bruising and pain with activity) and revealed recent T4 compression fracture with severe spinal stenosis L3/4 and asymmetric patchy pulmonary opacities RLL and RUE question atelectasis v/s PNA.  He continues on Keppra for seizure prophylaxis.   Dr. Zada Finders reviewed back films and felt no intervention or brace needed.  Therapy ongoing and patient limited by confusion, cognitive deficits, premorbid visual deficits, delayed processing. Wife at bedside. Difficulty elevating right leg due to pain.     Patient currently requires max with basic self-care skills secondary to muscle weakness, decreased cardiorespiratoy endurance, impaired timing and sequencing, unbalanced muscle activation, motor apraxia, and decreased coordination, decreased visual acuity, decreased awareness, decreased problem solving, decreased safety awareness, and decreased memory, and decreased sitting balance, decreased standing balance, decreased postural control, and decreased balance strategies.  Prior to hospitalization, patient could complete BADL with independent .  Patient will benefit from skilled intervention to decrease level of assist with basic self-care skills and increase independence with basic self-care skills prior to discharge home with care partner.  Anticipate patient will require 24 hour supervision and follow up home health.  OT - End of Session Activity Tolerance:  Tolerates 30+ min activity with multiple rests Endurance Deficit: Yes OT Assessment Rehab Potential (ACUTE ONLY): Good OT Barriers to  Discharge: Decreased caregiver support;Weight bearing restrictions;Home environment access/layout;Lack of/limited family support OT Barriers to Discharge Comments: wife unable to provide lifting assistance, 3 daughters in area OT Patient demonstrates impairments in the following area(s): Balance;Cognition;Endurance;Motor;Pain;Safety;Sensory;Vision OT Basic ADL's Functional Problem(s): Grooming;Bathing;Dressing OT Transfers Functional Problem(s): Toilet;Tub/Shower OT Plan OT Intensity: Minimum of 1-2 x/day, 45 to 90 minutes OT Frequency: 5 out of 7 days OT Duration/Estimated Length of Stay: 3-3.5 weeks OT Treatment/Interventions: Balance/vestibular training;DME/adaptive equipment instruction;Patient/family education;Therapeutic Activities;Wheelchair propulsion/positioning;Therapeutic Exercise;Psychosocial support;Functional electrical stimulation;Cognitive remediation/compensation;Community reintegration;Self Care/advanced ADL retraining;UE/LE Strength taining/ROM;Functional mobility training;Discharge planning;Neuromuscular re-education;Skin care/wound managment;Disease mangement/prevention;UE/LE Coordination activities;Pain management;Splinting/orthotics;Visual/perceptual remediation/compensation OT Self Feeding Anticipated Outcome(s): S OT Basic Self-Care Anticipated Outcome(s): S OT Toileting Anticipated Outcome(s): S OT Bathroom Transfers Anticipated Outcome(s): S OT Recommendation Patient destination: Home Follow Up Recommendations: Home health OT Equipment Recommended: 3 in 1 bedside comode;To be determined;Tub/shower seat Equipment Details: pt reports having BSC over toilet but no shower seat   OT Evaluation Precautions/Restrictions  Precautions Precautions: Fall Precaution Comments: foley Restrictions RLE Weight Bearing: Weight bearing as tolerated LLE Weight Bearing: Weight bearing as tolerated General Chart Reviewed: Yes Family/Caregiver Present: No Vital Signs   Pain    Home Living/Prior Functioning Home Living Family/patient expects to be discharged to:: Private residence Living Arrangements: Spouse/significant other Available Help at Discharge: Family, Available 24 hours/day Type of Home: House Home Access: Level entry Home Layout: Two level, Able to live on main level with bedroom/bathroom Bathroom Shower/Tub: Multimedia programmer: Standard Additional Comments: has 4 children in addition to wife (A)  Lives With: Spouse IADL History Education: master's degree in Engineer, mining Prior Function Vocation: Retired Surveyor, mining Baseline Vision/History: Legally blind Additional Comments: sees only out of R eye 20% at baseline, pt reports seeing gray and white and shadows. Pt makes outline of therapist Perception  Perception: Impaired Inattention/Neglect: Does not attend to left side of body Praxis Praxis: Impaired Cognition Overall Cognitive Status: Impaired/Different from baseline Arousal/Alertness: Awake/alert Orientation Level: Person;Place;Situation Person: Oriented Place: Oriented Situation: Oriented Year: 2022 Month: June Day of Week: Incorrect Memory: Impaired Immediate Memory Recall: Sock;Blue;Bed Memory Recall Sock: Not able to recall Memory Recall Blue: Without Cue Memory Recall Bed: Without Cue Awareness: Impaired Awareness Impairment: Emergent impairment;Intellectual impairment Safety/Judgment: Impaired Sensation Sensation Light Touch: Appears Intact Coordination Gross Motor Movements are Fluid and Coordinated: No Fine Motor Movements are Fluid and Coordinated: No Coordination and Movement Description: pain guarding iwht LE movements. Motor  Motor Motor: Abnormal postural alignment and control  Trunk/Postural Assessment  Cervical Assessment Cervical Assessment:  (head forward) Thoracic Assessment Thoracic Assessment:  (rounded shoudlers) Lumbar Assessment Lumbar Assessment:  (post pelvic tilt) Postural  Control Postural Control: Deficits on evaluation  Balance Balance Balance Assessed: Yes Dynamic Sitting Balance Dynamic Sitting - Level of Assistance: 4: Min assist Sitting balance - Comments: posterior bias and shifting off R hip Static Standing Balance Static Standing - Level of Assistance: 2: Max assist Static Standing - Comment/# of Minutes: posterior bias during LB dressing Extremity/Trunk Assessment RUE Assessment General Strength Comments: generalized weakness LUE Assessment General Strength Comments: generalized weakness >RUE  Care Tool Care Tool Self Care Eating   Eating Assist Level: Supervision/Verbal cueing    Oral Care    Oral Care Assist Level: Minimal Assistance - Patient > 75%    Bathing   Body parts bathed by patient: Right arm;Left arm;Chest;Abdomen;Front perineal area;Right upper leg;Left upper leg;Face Body  parts bathed by helper: Buttocks;Left lower leg;Right lower leg   Assist Level: Maximal Assistance - Patient 24 - 49% (STS)    Upper Body Dressing(including orthotics)   What is the patient wearing?: Pull over shirt   Assist Level: Contact Guard/Touching assist    Lower Body Dressing (excluding footwear)   What is the patient wearing?: Underwear/pull up;Pants Assist for lower body dressing: Dependent - Patient 0%    Putting on/Taking off footwear   What is the patient wearing?: Non-skid slipper socks Assist for footwear: Dependent - Patient 0%       Care Tool Toileting Toileting activity   Assist for toileting: Dependent - Patient 0%     Care Tool Bed Mobility Roll left and right activity        Sit to lying activity        Lying to sitting edge of bed activity   Lying to sitting edge of bed assist level: Maximal Assistance - Patient 25 - 49%     Care Tool Transfers Sit to stand transfer   Sit to stand assist level: Maximal Assistance - Patient 25 - 49%    Chair/bed transfer   Chair/bed transfer assist level: Dependent -  mechanical lift     Toilet transfer   Assist Level: Dependent - Patient 0%     Care Tool Cognition Expression of Ideas and Wants Expression of Ideas and Wants: Some difficulty - exhibits some difficulty with expressing needs and ideas (e.g, some words or finishing thoughts) or speech is not clear   Understanding Verbal and Non-Verbal Content Understanding Verbal and Non-Verbal Content: Usually understands - understands most conversations, but misses some part/intent of message. Requires cues at times to understand   Memory/Recall Ability *first 3 days only Memory/Recall Ability *first 3 days only: Staff names and faces;That he or she is in a hospital/hospital unit    Refer to Care Plan for [place] 1 OT Short Term Goal 1 (Week 1): Pt will stand pivot transfer to BSC/toilet wiht MOD A consistently OT Short Term Goal 2 (Week 1): Pt will thread 1LE into pants OT Short Term Goal 3 (Week 1): Pt will groom wiht S  Recommendations for other services: None    Skilled Therapeutic Intervention 1:1. Pt received in bed agreeable to OT after edu on OT role/purpose of CIR, ELOS and POC. Pt completes BADL/toileting at [facility] level using stedy for transfers and RW for LB dressing sit to stand as written below. Pt with mild memory impairments evident during session and demo pain guarding with mobility. Exited session with pt seated in bed, exit alarm on and call light in reach   ADL ADL Eating: Supervision/safety Where Assessed-Eating: Chair Grooming: Minimal assistance Upper Body Bathing: Supervision/safety Where Assessed-Upper Body Bathing: Chair Lower Body Bathing: Maximal assistance Where Assessed-Lower Body Bathing: Chair Upper Body Dressing: Contact guard Where Assessed-Upper Body Dressing: Chair Lower Body Dressing: Dependent Where Assessed-Lower Body Dressing: Chair Toileting: Dependent Where Assessed-Toileting: Bedside Commode Toilet Transfer:  Dependent Toilet Transfer Method:  (stedy- MOD A power up) Mobility  Bed Mobility Bed Mobility: Sit to Supine;Supine to Sit Supine to Sit: Maximal Assistance - Patient - Patient 25-49% Sit to Supine: Maximal Assistance - Patient 25-49% Transfers Sit to Stand: Maximal Assistance - Patient 25-49% Stand to Sit: Maximal Assistance - Patient 25-49%   Discharge Criteria: Patient will be discharged from OT if patient refuses treatment 3 consecutive times without medical reason, if treatment goals not met,  if there is a change in medical status, if patient makes no progress towards goals or if patient is discharged from hospital.  The above assessment, treatment plan, treatment alternatives and goals were discussed and mutually agreed upon: by patient  Tonny Branch 10/14/2020, 9:03 AM

## 2020-10-14 NOTE — Progress Notes (Signed)
Inpatient Rehabilitation Center Individual Statement of Services  Patient Name:  Nathan Mitchell  Date:  10/14/2020  Welcome to the Inpatient Rehabilitation Center.  Our goal is to provide you with an individualized program based on your diagnosis and situation, designed to meet your specific needs.  With this comprehensive rehabilitation program, you will be expected to participate in at least 3 hours of rehabilitation therapies Monday-Friday, with modified therapy programming on the weekends.  Your rehabilitation program will include the following services:  Physical Therapy (PT), Occupational Therapy (OT), Speech Therapy (ST), 24 hour per day rehabilitation nursing, Therapeutic Recreaction (TR), Neuropsychology, Care Coordinator, Rehabilitation Medicine, Nutrition Services, Pharmacy Services, and Other  Weekly team conferences will be held on Tuesdays to discuss your progress.  Your Inpatient Rehabilitation Care Coordinator will talk with you frequently to get your input and to update you on team discussions.  Team conferences with you and your family in attendance may also be held.  Expected length of stay: 24-27 Days  Overall anticipated outcome: MOD A  Depending on your progress and recovery, your program may change. Your Inpatient Rehabilitation Care Coordinator will coordinate services and will keep you informed of any changes. Your Inpatient Rehabilitation Care Coordinator's name and contact numbers are listed  below.  The following services may also be recommended but are not provided by the Inpatient Rehabilitation Center:   Home Health Rehabiltiation Services Outpatient Rehabilitation Services    Arrangements will be made to provide these services after discharge if needed.  Arrangements include referral to agencies that provide these services.  Your insurance has been verified to be:  [employer] Your primary doctor is:  Merlene Laughter, MD  Pertinent information will  be shared with your doctor and your insurance company.  Inpatient Rehabilitation Care Coordinator:  Susie Cassette [phone] or [phone]  Information discussed with and copy given to patient by: [name], 10/14/2020, 3:30 PM

## 2020-10-14 NOTE — Progress Notes (Signed)
PROGRESS NOTE   Subjective/Complaints: Had a reasonable night. Still having pain on right side but it seems tolerable. Was able to sleep. Hasn't moved bowels for some time but has urge to empty this morning. Has mild headache  ROS: Patient denies fever, rash, sore throat, blurred vision, nausea, vomiting, diarrhea, cough, shortness of breath or chest pain,   or mood change.   Objective:   CT THORACIC SPINE WO CONTRAST  Result Date: 10/12/2020 CLINICAL DATA:  Mid back pain.  Lumbar radiculopathy. EXAM: CT THORACIC AND LUMBAR SPINE WITHOUT CONTRAST TECHNIQUE: Multidetector CT imaging of the thoracic and lumbar spine was performed without contrast. Multiplanar CT image reconstructions were also generated. COMPARISON:  CT abdomen and pelvis 09/05/2020.  CT chest 12/25/2019. FINDINGS: CT THORACIC SPINE FINDINGS Alignment: Mild thoracic dextroscoliosis. Trace retrolisthesis of T12 on L1. Vertebrae: A T4 compression fracture is new from the 2021 chest CT and appears recent/unhealed with 20% central vertebral body height loss and no significant retropulsion or posterior element involvement. The bones are diffusely osteopenic. There is a hemangioma in the T2 vertebral body. Paraspinal and other soft tissues: Moderately large sliding hiatal hernia. Aortic and coronary atherosclerosis. Partially visualized aneurysmal dilatation of the aortic arch measuring 4.8 cm in diameter, similar to the prior chest CT. Small bilateral pleural effusions. Asymmetric patchy opacity posteriorly in the right lower lobe and right upper lobe with assessment limited by respiratory motion artifact and small field of view. Disc levels: Mild for age thoracic disc degeneration. Asymmetrically advanced right-sided facet arthrosis in the upper thoracic and lower cervical spine. No evidence of high-grade spinal canal or neural foraminal stenosis. CT LUMBAR SPINE FINDINGS Segmentation: 5  lumbar type vertebrae. Alignment: Normal. Vertebrae: No acute fracture or suspicious osseous lesion. Similar appearance of multiple Schmorl's nodes. Partial fusion across the L4-5 disc space. Paraspinal and other soft tissues: Aortic atherosclerosis. Unchanged small right adrenal adenoma. Mildly increased size of multiple left para-aortic lymph nodes measuring up to 1.2 cm in short axis and of left common and external iliac lymph nodes measuring up to 1.4 cm in short axis. Disc levels: Disc bulging and posterior element hypertrophy result in severe spinal stenosis at L3-4, moderate to severe spinal stenosis at L2-3, and mild spinal stenosis at L4-5. There is mild-to-moderate multilevel neural foraminal stenosis. IMPRESSION: 1. Recent T4 compression fracture with mild height loss. 2. Lumbar disc and facet degeneration most notable at L3-4 where there is severe spinal stenosis. 3. Small bilateral pleural effusions. 4. Asymmetric patchy pulmonary opacity posteriorly in the right lower lobe and right upper lobe which may reflect atelectasis or pneumonia. 5. Mildly increased left-sided retroperitoneal lymphadenopathy, nonspecific. 6. Aortic Atherosclerosis (ICD10-I70.0). Electronically Signed   By: Sebastian AcheAllen  Grady M.D.   On: 10/12/2020 17:26   CT LUMBAR SPINE WO CONTRAST  Result Date: 10/12/2020 CLINICAL DATA:  Mid back pain.  Lumbar radiculopathy. EXAM: CT THORACIC AND LUMBAR SPINE WITHOUT CONTRAST TECHNIQUE: Multidetector CT imaging of the thoracic and lumbar spine was performed without contrast. Multiplanar CT image reconstructions were also generated. COMPARISON:  CT abdomen and pelvis 09/05/2020.  CT chest 12/25/2019. FINDINGS: CT THORACIC SPINE FINDINGS Alignment: Mild thoracic dextroscoliosis. Trace retrolisthesis of T12 on L1.  Vertebrae: A T4 compression fracture is new from the 2021 chest CT and appears recent/unhealed with 20% central vertebral body height loss and no significant retropulsion or posterior  element involvement. The bones are diffusely osteopenic. There is a hemangioma in the T2 vertebral body. Paraspinal and other soft tissues: Moderately large sliding hiatal hernia. Aortic and coronary atherosclerosis. Partially visualized aneurysmal dilatation of the aortic arch measuring 4.8 cm in diameter, similar to the prior chest CT. Small bilateral pleural effusions. Asymmetric patchy opacity posteriorly in the right lower lobe and right upper lobe with assessment limited by respiratory motion artifact and small field of view. Disc levels: Mild for age thoracic disc degeneration. Asymmetrically advanced right-sided facet arthrosis in the upper thoracic and lower cervical spine. No evidence of high-grade spinal canal or neural foraminal stenosis. CT LUMBAR SPINE FINDINGS Segmentation: 5 lumbar type vertebrae. Alignment: Normal. Vertebrae: No acute fracture or suspicious osseous lesion. Similar appearance of multiple Schmorl's nodes. Partial fusion across the L4-5 disc space. Paraspinal and other soft tissues: Aortic atherosclerosis. Unchanged small right adrenal adenoma. Mildly increased size of multiple left para-aortic lymph nodes measuring up to 1.2 cm in short axis and of left common and external iliac lymph nodes measuring up to 1.4 cm in short axis. Disc levels: Disc bulging and posterior element hypertrophy result in severe spinal stenosis at L3-4, moderate to severe spinal stenosis at L2-3, and mild spinal stenosis at L4-5. There is mild-to-moderate multilevel neural foraminal stenosis. IMPRESSION: 1. Recent T4 compression fracture with mild height loss. 2. Lumbar disc and facet degeneration most notable at L3-4 where there is severe spinal stenosis. 3. Small bilateral pleural effusions. 4. Asymmetric patchy pulmonary opacity posteriorly in the right lower lobe and right upper lobe which may reflect atelectasis or pneumonia. 5. Mildly increased left-sided retroperitoneal lymphadenopathy, nonspecific. 6.  Aortic Atherosclerosis (ICD10-I70.0). Electronically Signed   By: Sebastian Ache M.D.   On: 10/12/2020 17:26   DG Abd Portable 1V  Result Date: 10/13/2020 CLINICAL DATA:  Constipation EXAM: PORTABLE ABDOMEN - 1 VIEW COMPARISON:  CT abdomen and pelvis Sep 05, 2020 FINDINGS: Mild stool volume in colon. No bowel dilatation or air-fluid level to suggest bowel obstruction. No free air. Moderate hiatal hernia. Postoperative change proximal right femur. Lung bases clear. IMPRESSION: Mild stool volume. No bowel obstruction or free air. Moderate hiatal hernia. Electronically Signed   By: Bretta Bang III M.D.   On: 10/13/2020 18:56   Recent Labs    10/12/20 0054 10/14/20 0511  WBC 9.9 8.6  HGB 7.3* 7.5*  HCT 22.6* 23.5*  PLT 241 281   Recent Labs    10/12/20 0054 10/14/20 0511  NA 138 139  K 3.8 4.0  CL 107 108  CO2 24 23  GLUCOSE 116* 110*  BUN 21 21  CREATININE 0.93 0.93  CALCIUM 8.0* 8.2*    Intake/Output Summary (Last 24 hours) at 10/14/2020 8366 Last data filed at 10/14/2020 0409 Gross per 24 hour  Intake 60 ml  Output 725 ml  Net -665 ml        Physical Exam: Vital Signs Blood pressure 126/73, pulse 99, temperature 98.4 F (36.9 C), temperature source Oral, resp. rate 20, height 6\' 1"  (1.854 m), weight 86.2 kg, SpO2 97 %.  General: Alert and oriented x 3, No apparent distress HEENT: Head is normocephalic, atraumatic, PERRLA, EOMI, sclera anicteric, oral mucosa pink and moist, dentition intact, ext ear canals clear,  Neck: Supple without JVD or lymphadenopathy Heart: Reg rate and rhythm. No  murmurs rubs or gallops Chest: CTA bilaterally with occ wheezes. No rales, or rhonchi; no distress Abdomen: Soft, non-tender, non-distended, bowel sounds positive. Extremities: No clubbing, cyanosis, or edema. Pulses are 2+ Psych: Pt's affect is appropriate. Pt is cooperative Skin: diffuse bruising along right trunk and hip. Vac in right hip to suction. Scalp incision with  staples Neuro: Pt is cognitively appropriate with normal insight, memory, and awareness. Cranial nerves 2-12 are intact. Sensory exam is normal. Reflexes are 2+ in all 4's. Fine motor coordination is intact. No tremors. Motor function is grossly 5/5 in UE's. RLE 2/5 d/t pain prox to 4/5 distally. LLE 3-5/5 prox to distal.  Musculoskeletal:  right hip tender with rom, swollen   Assessment/Plan: 1. Functional deficits which require 3+ hours per day of interdisciplinary therapy in a comprehensive inpatient rehab setting. Physiatrist is providing close team supervision and 24 hour management of active medical problems listed below. Physiatrist and rehab team continue to assess barriers to discharge/monitor patient progress toward functional and medical goals  Care Tool:  Bathing    Body parts bathed by patient: Right arm, Left arm, Chest, Abdomen, Front perineal area, Right upper leg, Left upper leg, Face   Body parts bathed by helper: Buttocks, Left lower leg, Right lower leg     Bathing assist Assist Level: Maximal Assistance - Patient 24 - 49% (STS)     Upper Body Dressing/Undressing Upper body dressing   What is the patient wearing?: Pull over shirt    Upper body assist Assist Level: Contact Guard/Touching assist    Lower Body Dressing/Undressing Lower body dressing      What is the patient wearing?: Underwear/pull up, Pants     Lower body assist Assist for lower body dressing: Dependent - Patient 0%     Toileting Toileting    Toileting assist Assist for toileting: Dependent - Patient 0%     Transfers Chair/bed transfer  Transfers assist     Chair/bed transfer assist level: Dependent - mechanical lift     Locomotion Ambulation   Ambulation assist              Walk 10 feet activity   Assist           Walk 50 feet activity   Assist           Walk 150 feet activity   Assist           Walk 10 feet on uneven surface   activity   Assist           Wheelchair     Assist               Wheelchair 50 feet with 2 turns activity    Assist            Wheelchair 150 feet activity     Assist          Blood pressure 126/73, pulse 99, temperature 98.4 F (36.9 C), temperature source Oral, resp. rate 20, height 6\' 1"  (1.854 m), weight 86.2 kg, SpO2 97 %.        Medical Problem List and Plan: 1.  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  Antithrombotics: -DVT/anticoagulation:  Pharmaceutical: Lovenox bid             -antiplatelet therapy: N/A 3. Pain Management: Schedule tylenol 650mg  TID. LFTs reviewed and wnl. Continue oxycodone/robaxin prn 4. Mood: LCSW to follow for evaluation and support.             -antipsychotic agents: N/A 5. Neuropsych: This patient is not fully capable of making decisions on his own behalf.             --mentation seems to be improving 6. Skin/Wound Care: Routine presssure relief measures 7. Fluids/Electrolytes/Nutrition: Appetite has been good per wife. -- I personally reviewed all of the patient's labs today, and lab work is within normal limits.  8. Right femur fracture: WBAT. Prevena VAC in place for 3 more days             --Continue to monitor wound drainage 9. 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 Continue until 6/16             10 Giant cell arteritis with visual loss: Was on methotrexate weekly. --Followed by Dr. 7/16 PTA. 11. Lennette Bihari HR tid--incidental finding years ago and has been controlled.             --To follow up with Card? PCP regarding d/c of Xarelto per notes. 12. Constipation: Has not had BM since admission--eating well, no abdominal pain             --kub with moderate stool  -pt felt that he needed to empty bowels this morning  -sorbitol today if no  bm this am 13. Urinary retention: Has failed one voiding trial by Dr. FHL:[id number].             --plans for ultrasound of bladder/prostate? 06/24?             --Continue Flomax at bedtime.  -foley  14. Leucocytosis: Resolving without signs of infection             --encourage pulmonary toilet for likely Right atelectasis. 15. ABLA: Continue to monitor H/H with serial checks and monitor for signs of bleeding. --Monitor hematoma right hip./flank.             -6/15 hgb looks to be trending slowly up, 7.5 today 16. H/o COPD: Stable. Did not like trial of MDI per wife.   LOS: 1 days A FACE TO FACE EVALUATION WAS PERFORMED  09-03-1984 10/14/2020, 9:07 AM

## 2020-10-15 NOTE — Progress Notes (Signed)
Physical Therapy TBI Note  Patient Details  Name: Nathan Mitchell MRN: [id number] Date of Birth: [birth year]  Today's Date: 10/16/2020 PT Individual Time: 1405-1510 PT Individual Time Calculation (min): 65 min  Short Term Goals: Week 1:  PT Short Term Goal 1 (Week 1): Patient will perform bed mobility with min A consistently. PT Short Term Goal 2 (Week 1): Patient will perform basic transfers with min A consistently. PT Short Term Goal 3 (Week 1): Patient will ambulate >25 ft using LRAD. PT Short Term Goal 4 (Week 1): Patient will propel w/c >100 ft  Skilled Therapeutic Interventions/Progress Updates:     Patient in bed upon PT arrival. Patient alert and agreeable to PT session. Patient denied pain at rest, and reported 8/10 R hip pain with weight bearing during session. Discussed having patient pre-medicated prior to therapies for improved activity tolerance during sessions, patient and RN in agreement. Reinforced with nursing that patient may not recall that he needs medication prior to therapies due to cognitive deficits.   Therapeutic Activity: Bed Mobility: Patient performed supine to/from sit with min A for R lower extremity management. Provided verbal cues for log roll technique to maintain back precautions. Transfers: Patient performed stand pivot bed>w/c, w/c<>BSC over toilet, and mat table>w/c and he performed sit to/from stand x2 with min A using RW for all transfers. Provided verbal cues for hand placement, forward weight shift and controlled descent x4, patient able to teach back to therapist on last 2 transfers. Patient was continent of bowl during toileting and urinary catheter was emptied by therapist, see flowsheet for details. Performed peri-care and lower body dressing with total A due to decreased balance and increased R hip pain in standing.  Gait Training:  Patient ambulated 10 feet using RW with min A-CGA. Ambulated with step-to gait pattern leading with R, minimal  weight shift to R in stance, and very small step length and minimal foot clearance. Provided verbal cues for stepping between his hands to allow upper extremities to off-weight his R leg in stance, increased step length and height, and erect posture.  Wheelchair Mobility:  Patient propelled wheelchair >150 feet with CGA for safety as patient has limited vision and tends to veer R, able to correct with cues. Provided verbal cues for propulsion technique and steering to compensate for visual deficits.  Patient in bed at end of session with breaks locked, bed alarm set, and all needs within reach.   Therapy Documentation Precautions:  Precautions Precautions: Fall Precaution Comments: foley, T4 compression fx (no brace) Restrictions Weight Bearing Restrictions: No RLE Weight Bearing: Weight bearing as tolerated LLE Weight Bearing: Weight bearing as tolerated  Agitated Behavior Scale: TBI Observation Details Observation Environment: Patient's room Start of observation period - Date: 10/15/20 Start of observation period - Time: 0810 End of observation period - Date: 10/15/20 End of observation period - Time: 0950 Agitated Behavior Scale (DO NOT LEAVE BLANKS) Short attention span, easy distractibility, inability to concentrate: Absent Impulsive, impatient, low tolerance for pain or frustration: Absent Uncooperative, resistant to care, demanding: Absent Violent and/or threatening violence toward people or property: Absent Explosive and/or unpredictable anger: Absent Rocking, rubbing, moaning, or other self-stimulating behavior: Absent Pulling at tubes, restraints, etc.: Absent Wandering from treatment areas: Absent Restlessness, pacing, excessive movement: Absent Repetitive behaviors, motor, and/or verbal: Absent Rapid, loud, or excessive talking: Absent Sudden changes of mood: Absent Easily initiated or excessive crying and/or laughter: Absent Self-abusiveness, physical and/or  verbal: Absent Agitated behavior scale total score: 14  Therapy/Group: Individual Therapy  Maxson Oddo L Cardale Dorer PT, DPT  10/15/2020, 6:29 PM

## 2020-10-15 NOTE — Progress Notes (Signed)
Occupational Therapy TBI Note  Patient Details  Name: Nathan Mitchell MRN: [id number] Date of Birth: [birth year]  Today's Date: 10/15/2020 OT Individual Time: 0626-9485 OT Individual Time Calculation (min): 26 min    Short Term Goals: Week 1:  OT Short Term Goal 1 (Week 1): Pt will stand pivot transfer to BSC/toilet wiht MOD A consistently OT Short Term Goal 2 (Week 1): Pt will thread 1LE into pants OT Short Term Goal 3 (Week 1): Pt will groom wiht S  Skilled Therapeutic Interventions/Progress Updates:    Pt in bed to start session with MD in to morning visit.  He was agreeable to transfer to the EOB with mod assist for advancing the RLE off of the edge as well and for transitioning trunk up to sitting.  He was then able to complete stand pivot transfer to the wheelchair with mod assist and mod demonstrational cueing for hand placement and sequencing of the walker.  Increased posterior lean with initial transition sit to stand.  He completed sit to stand from the wheelchair X2 at [facility] assist level as well and then completed transfer to the 3:1 over the toilet with mod assist for clothing management in standing.  He was left with NT to finish toileting and for transfer back to the wheelchair.  Pain reported at 5/10 in the right hip with activity.    Therapy Documentation Precautions:  Precautions Precautions: Fall Precaution Comments: foley, T4 compression fx (no brace) Restrictions Weight Bearing Restrictions: No RLE Weight Bearing: Weight bearing as tolerated LLE Weight Bearing: Weight bearing as tolerated  Pain: Pain Assessment Pain Scale: Faces Pain Score: 0-No pain Agitated Behavior Scale: TBI Observation Details Observation Environment: Patients room Start of observation period - Date: 10/15/20 Start of observation period - Time: 0914 End of observation period - Date: 10/15/20 End of observation period - Time: 0930 Agitated Behavior Scale (DO NOT LEAVE BLANKS) Short  attention span, easy distractibility, inability to concentrate: Absent Impulsive, impatient, low tolerance for pain or frustration: Absent Uncooperative, resistant to care, demanding: Absent Violent and/or threatening violence toward people or property: Absent Explosive and/or unpredictable anger: Absent Rocking, rubbing, moaning, or other self-stimulating behavior: Absent Pulling at tubes, restraints, etc.: Absent Wandering from treatment areas: Absent Restlessness, pacing, excessive movement: Absent Repetitive behaviors, motor, and/or verbal: Absent Rapid, loud, or excessive talking: Absent Sudden changes of mood: Absent Easily initiated or excessive crying and/or laughter: Absent Self-abusiveness, physical and/or verbal: Absent Agitated behavior scale total score: 14  ADL: See Care Tool Section for some details of mobility and selfcare  Therapy/Group: Individual Therapy  Brileigh Sevcik OTR/L 10/15/2020, 12:17 PM

## 2020-10-15 NOTE — Progress Notes (Addendum)
PROGRESS NOTE   Subjective/Complaints: Became a little confused last night and pulled out prevena, tried to get out of bed. Foam dressing in its place. Pt doesn't actually recall doing so.   ROS: Patient denies fever, rash, sore throat, blurred vision, nausea, vomiting, diarrhea, cough, shortness of breath or chest pain, joint or back pain, headache, or mood change.   Objective:   DG Abd Portable 1V  Result Date: 10/13/2020 CLINICAL DATA:  Constipation EXAM: PORTABLE ABDOMEN - 1 VIEW COMPARISON:  CT abdomen and pelvis Sep 05, 2020 FINDINGS: Mild stool volume in colon. No bowel dilatation or air-fluid level to suggest bowel obstruction. No free air. Moderate hiatal hernia. Postoperative change proximal right femur. Lung bases clear. IMPRESSION: Mild stool volume. No bowel obstruction or free air. Moderate hiatal hernia. Electronically Signed   By: Bretta Bang III M.D.   On: 10/13/2020 18:56   Recent Labs    10/14/20 0511  WBC 8.6  HGB 7.5*  HCT 23.5*  PLT 281   Recent Labs    10/14/20 0511  NA 139  K 4.0  CL 108  CO2 23  GLUCOSE 110*  BUN 21  CREATININE 0.93  CALCIUM 8.2*    Intake/Output Summary (Last 24 hours) at 10/15/2020 1116 Last data filed at 10/15/2020 0826 Gross per 24 hour  Intake 718 ml  Output 1100 ml  Net -382 ml        Physical Exam: Vital Signs Blood pressure 137/72, pulse 98, temperature 98.3 F (36.8 C), temperature source Oral, resp. rate 17, height 6\' 1"  (1.854 m), weight 86 kg, SpO2 95 %.  Constitutional: No distress . Vital signs reviewed. HEENT: EOMI, oral membranes moist Neck: supple Cardiovascular: RRR without murmur. No JVD    Respiratory/Chest: CTA Bilaterally without wheezes or rales. Normal effort    GI/Abdomen: BS +, non-tender, non-distended Ext: no clubbing, cyanosis, or edema Psych: pleasant and cooperative Skin: diffuse bruising along right trunk and hip. Hip incision,  dressing in place with minimal drainage. Scalp incision with staples Neuro: Pt is cognitively appropriate with normal insight, memory, and awareness. Cranial nerves 2-12 are intact. Sensory exam is normal. Reflexes are 2+ in all 4's. Fine motor coordination is intact. No tremors. Motor function is grossly 5/5 in UE's. RLE 2/5 d/t pain prox to 4/5 distally. LLE 3-5/5 prox to distal.--stable  Musculoskeletal:  right hip tender with rom, swollen   Assessment/Plan: 1. Functional deficits which require 3+ hours per day of interdisciplinary therapy in a comprehensive inpatient rehab setting. Physiatrist is providing close team supervision and 24 hour management of active medical problems listed below. Physiatrist and rehab team continue to assess barriers to discharge/monitor patient progress toward functional and medical goals  Care Tool:  Bathing    Body parts bathed by patient: Right arm, Left arm, Chest, Abdomen, Front perineal area, Right upper leg, Left upper leg, Face   Body parts bathed by helper: Buttocks, Left lower leg, Right lower leg     Bathing assist Assist Level: Maximal Assistance - Patient 24 - 49% (STS)     Upper Body Dressing/Undressing Upper body dressing   What is the patient wearing?: Pull over shirt  Upper body assist Assist Level: Contact Guard/Touching assist    Lower Body Dressing/Undressing Lower body dressing      What is the patient wearing?: Underwear/pull up, Pants, Incontinence brief     Lower body assist Assist for lower body dressing: Dependent - Patient 0%     Toileting Toileting    Toileting assist Assist for toileting: Dependent - Patient 0%     Transfers Chair/bed transfer  Transfers assist     Chair/bed transfer assist level: Moderate Assistance - Patient 50 - 74% Chair/bed transfer assistive device: Geologist, engineering   Ambulation assist      Assist level: Moderate Assistance - Patient 50 - 74% Assistive  device: Walker-rolling Max distance: 4 ft   Walk 10 feet activity   Assist  Walk 10 feet activity did not occur: Safety/medical concerns (Limited by increased R hip pain)        Walk 50 feet activity   Assist Walk 50 feet with 2 turns activity did not occur: Safety/medical concerns         Walk 150 feet activity   Assist Walk 150 feet activity did not occur: Safety/medical concerns         Walk 10 feet on uneven surface  activity   Assist Walk 10 feet on uneven surfaces activity did not occur: Safety/medical concerns         Wheelchair     Assist     Wheelchair activity did not occur: Safety/medical concerns (Limited by increased R hip pain during evaluation)         Wheelchair 50 feet with 2 turns activity    Assist    Wheelchair 50 feet with 2 turns activity did not occur: Safety/medical concerns       Wheelchair 150 feet activity     Assist  Wheelchair 150 feet activity did not occur: Safety/medical concerns       Blood pressure 137/72, pulse 98, temperature 98.3 F (36.8 C), temperature source Oral, resp. rate 17, height 6\' 1"  (1.854 m), weight 86 kg, SpO2 95 %.        Medical Problem List and Plan: 1.  TBI with associated SDH. Also suffered right IT hip fracture, T4 compression fx             -patient may not shower             -ELOS/Goals: MinA 2-3 weeks             --Patient is beginning CIR therapies today including PT, OT, and SLP  2.  Antithrombotics: -DVT/anticoagulation:  Pharmaceutical: Lovenox bid             -antiplatelet therapy: N/A 3. Pain Management: Schedule tylenol 650mg  TID. LFTs reviewed and wnl. Continue oxycodone/robaxin prn 4. Mood: LCSW to follow for evaluation and support.             -antipsychotic agents: N/A 5. Neuropsych: This patient is not fully capable of making decisions on his own behalf.             --some HS confusion last night--generally cognition has been improving  -will check  sleep chart  -avoid neurosedating meds 6. Skin/Wound Care: foam dressing to right hip incision-minimal to no drainage 7. Fluids/Electrolytes/Nutrition: Appetite has been good per wife. -- encourage PO 8. Right femur fracture: WBAT. Prevena VAC out             --Continue to monitor wound drainage 9. L-SDH: On Keppra X  7 days for seizure prophylaxis. Continue until 6/16             10 Giant cell arteritis with visual loss: Was on methotrexate weekly. --Followed by Dr. Lennette Bihari PTA--d/w her timing of resumption 11. NWG:NFAOZHY HR tid--incidental finding years ago and has been controlled.             --To follow up with Card? PCP regarding d/c of Xarelto per notes. 12. Constipation: Has not had BM since admission--eating well, no abdominal pain             --kub with moderate stool  -moved bowels 6/15 and 16 13. Urinary retention: Has failed one voiding trial by Dr. Cardell Peach.             --plans for ultrasound of bladder/prostate? 06/24?             --Continue Flomax at bedtime.  -foley  14. Leucocytosis: Resolving without signs of infection             --encourage pulmonary toilet for likely Right atelectasis. 15. ABLA: Continue to monitor H/H with serial checks and monitor for signs of bleeding. --Monitor hematoma right hip./flank.             -6/15 hgb looks to be trending slowly up, 7.5   16. H/o COPD: Stable. Did not like trial of MDI per wife.   LOS: 2 days A FACE TO FACE EVALUATION WAS PERFORMED  Ranelle Oyster 10/15/2020, 11:16 AM

## 2020-10-15 NOTE — Progress Notes (Signed)
Received a call from [facility], foley catheter due for change 10/25/20.

## 2020-10-15 NOTE — Progress Notes (Signed)
Occupational Therapy TBI Note  Patient Details  Name: Nathan Mitchell MRN: [id number] Date of Birth: 04/07/1931  Today's Date: 10/15/2020 OT Individual Time: 1005-1100 OT Individual Time Calculation (min): 55 min    Short Term Goals: Week 1:  OT Short Term Goal 1 (Week 1): Pt will stand pivot transfer to BSC/toilet wiht MOD A consistently OT Short Term Goal 2 (Week 1): Pt will thread 1LE into pants OT Short Term Goal 3 (Week 1): Pt will groom wiht S  Skilled Therapeutic Interventions/Progress Updates:     Pt received in bed with 4 out of 10 pain in hip. Heat from shower provided for pain relief (dressings covered/wound vac out)  ADL:  Pt completes bathing with MOD A to wash B feet, back and buttokcs Pt completes UB dressing with supervision/A for orientation Pt completes LB dressing with total A sit to stand with MOD A at EOB with RW Pt completes footwear with total A  Pt completes shower/Tub transfer with stedy and MOD A to power up and transfer to TTB in shower Grooming EOB with VC for sequencing/A to apply toothpaste d/t visual deficits  Pt left at end of session in bed with exit alarm on, call light in reach and all needs met   Therapy Documentation Precautions:  Precautions Precautions: Fall Precaution Comments: foley, T4 compression fx (no brace) Restrictions Weight Bearing Restrictions: No RLE Weight Bearing: Weight bearing as tolerated LLE Weight Bearing: Weight bearing as tolerated General:   Vital Signs:   Pain: Pain Assessment Pain Scale: Faces Pain Score: 0-No pain Agitated Behavior Scale: TBI   Observation Details Observation Environment: pt room Start of observation period - Date: 10/15/20 Start of observation period - Time: 1000 End of observation period - Date: 10/15/20 End of observation period - Time: 1100 Agitated Behavior Scale (DO NOT LEAVE BLANKS) Short attention span, easy distractibility, inability to concentrate:  Absent Impulsive, impatient, low tolerance for pain or frustration: Absent Uncooperative, resistant to care, demanding: Absent Violent and/or threatening violence toward people or property: Absent Explosive and/or unpredictable anger: Absent Rocking, rubbing, moaning, or other self-stimulating behavior: Absent Pulling at tubes, restraints, etc.: Absent Wandering from treatment areas: Absent Restlessness, pacing, excessive movement: Absent Repetitive behaviors, motor, and/or verbal: Absent Rapid, loud, or excessive talking: Absent Sudden changes of mood: Absent Easily initiated or excessive crying and/or laughter: Absent Self-abusiveness, physical and/or verbal: Absent Agitated behavior scale total score: 14  ADL: ADL Eating: Supervision/safety Where Assessed-Eating: Chair Grooming: Minimal assistance Upper Body Bathing: Supervision/safety Where Assessed-Upper Body Bathing: Chair Lower Body Bathing: Maximal assistance Where Assessed-Lower Body Bathing: Chair Upper Body Dressing: Contact guard Where Assessed-Upper Body Dressing: Chair Lower Body Dressing: Dependent Where Assessed-Lower Body Dressing: Chair Toileting: Dependent Where Assessed-Toileting: Bedside Commode Toilet Transfer: Dependent Toilet Transfer Method:  (stedy- MOD A power up) Vision   Perception    Praxis   Exercises:   Other Treatments:     Therapy/Group: Individual Therapy   M  10/15/2020, 12:22 PM 

## 2020-10-15 NOTE — Progress Notes (Signed)
Speech Language Pathology TBI Note  Patient Details  Name: Nathan Mitchell MRN: [id number] Date of Birth: 1930/08/17  Today's Date: 10/15/2020 SLP Individual Time: 0810-0850 SLP Individual Time Calculation (min): 40 min  Short Term Goals: Week 1: SLP Short Term Goal 1 (Week 1): Patient will recall daily, functional information with Min verbal cues for use of strategies. SLP Short Term Goal 2 (Week 1): Patient will identify 1 cognitive deficit since admission with Min verbal cues.  Skilled Therapeutic Interventions: Skilled treatment session focused on cognitive goals. Patient reported that he "vaguely"  remembered events from last night regarding attempting to get up without assistance and removal of wound vac. Patient verbalized reasoning for needing to call for assistance prior to getting up and a designated place was agreed upon by patient to place call bell for ease of use (clipped to left thigh). Patient had been listening to his audio books but unable to recall any details from the stories. Patient reported this was due to multiple stories being on one time and intermittently falling asleep while audio books were playing. Patient was able to verbalize and demonstrate how to use the audio book player with extra time. Patient left upright in bed with alarm on and all needs within reach. Continue with current plan of care.      Pain Pain Assessment Pain Scale: 0-10 Pain Score: 0-No pain  Agitated Behavior Scale: TBI Observation Details Observation Environment: Patient's room Start of observation period - Date: 10/15/20 Start of observation period - Time: 0810 End of observation period - Date: 10/15/20 End of observation period - Time: 0950 Agitated Behavior Scale (DO NOT LEAVE BLANKS) Short attention span, easy distractibility, inability to concentrate: Absent Impulsive, impatient, low tolerance for pain or frustration: Absent Uncooperative, resistant to care, demanding:  Absent Violent and/or threatening violence toward people or property: Absent Explosive and/or unpredictable anger: Absent Rocking, rubbing, moaning, or other self-stimulating behavior: Absent Pulling at tubes, restraints, etc.: Absent Wandering from treatment areas: Absent Restlessness, pacing, excessive movement: Absent Repetitive behaviors, motor, and/or verbal: Absent Rapid, loud, or excessive talking: Absent Sudden changes of mood: Absent Easily initiated or excessive crying and/or laughter: Absent Self-abusiveness, physical and/or verbal: Absent Agitated behavior scale total score: 14  Therapy/Group: Individual Therapy  Rhyann Berton 10/15/2020, 9:53 AM

## 2020-10-15 NOTE — Progress Notes (Signed)
Inpatient Rehabilitation Care Coordinator Assessment and Plan Patient Details  Name: Nathan Mitchell MRN: [id number] Date of Birth: 09-06-1930  Today's Date: 10/15/2020  Hospital Problems: Principal Problem:   TBI (traumatic brain injury) [facility])  Past Medical History:  Past Medical History:  Diagnosis Date   Aortic atherosclerosis ([place])    Atrial fibrillation ([place])    Giant cell arteritis ([place])    Osteopenia    Pure hypercholesterolemia    Thoracic aortic aneurysm ([place])    Thrombophilia ([place])    Past Surgical History:  Past Surgical History:  Procedure Laterality Date   APPENDECTOMY     CRANIOTOMY Left 10/08/2020   Procedure: LEFT BURR HOLE DRAINAGE OF SUBDURAL HEMATOMA;  Surgeon: Judith Part, MD;  Location: [place];  Service: Neurosurgery;  Laterality: Left;   HERNIA REPAIR     INTRAMEDULLARY (IM) NAIL INTERTROCHANTERIC Right 10/08/2020   Procedure: RIGHT INTRAMEDULLARY (IM) NAIL INTERTROCHANTRIC WITH PREVENA WOUND VAC APPLICATION;  Surgeon: Leandrew Koyanagi, MD;  Location: [place];  Service: Orthopedics;  Laterality: Right;   Social History:  reports that he has quit smoking. He has never used smokeless tobacco. He reports current alcohol use of about 3.0 standard drinks of alcohol per week. He reports that he does not use drugs.  Family / Support Systems Marital Status: Married How Long?: 2 years Patient Roles: Spouse, Parent Spouse/Significant Other: [name] (wife): [phone] Children: 4 adult children: 3 daughters that live in [place], [place], and [place]; 1 son- Linna Hoff; lives in [place]. Other Supports: None reported Anticipated Caregiver: Wife and his daughters will have PRN; plans to hire aide if needed Ability/Limitations of Caregiver: None reported Caregiver Availability: 24/7 Family Dynamics: Pt lives with his wife wife.  Social History Preferred language: English Religion: Presbyterian Cultural Background: Pt retired from working in  Engineer, mining. Education: Masters in Mudlogger from [employer] Read: Yes Write: Yes Employment Status: Retired Date Retired/Disabled/Unemployed: 20+ years ago Name of Employer: [employer] Return to Work Plans: Pt is retired Public relations account executive Issues: Denies Guardian/Conservator: N/A   Abuse/Neglect Abuse/Neglect Assessment Can Be Completed: Yes Physical Abuse: Denies Verbal Abuse: Denies Sexual Abuse: Denies Exploitation of patient/patient's resources: Denies Self-Neglect: Denies  Emotional Status Pt's affect, behavior and adjustment status: Pt in good spirits at time of visit Recent Psychosocial Issues: Denies Psychiatric History: Denies Substance Abuse History: Admits to glass of wine 5-6days per week; admits he quit smoking cigarettes 25 yrs ago.  Patient / Family Perceptions, Expectations & Goals Pt/Family understanding of illness & functional limitations: Pt and pt wife have general understanding of care needs Premorbid pt/family roles/activities: Independent Anticipated changes in roles/activities/participation: Assistance with ADLs/IADLs Pt/family expectations/goals: Pt goal is to regain "independence."  [employer]: None Premorbid Home Care/DME Agencies: None Transportation available at discharge: wife Resource referrals recommended: Neuropsychology  Discharge Planning Living Arrangements: Spouse/significant other Support Systems: Spouse/significant other, Children Type of Residence: Private residence Insurance Resources: Multimedia programmer (specify) ([place] Medicare) Financial Resources: Social Security, Other (Comment) (retirement/savings) Financial Screen Referred: No Living Expenses: Own Money Management: Spouse, Patient Does the patient have any problems obtaining your medications?: No Home Management: Pt reports he and wife shared roles. Patient/Family Preliminary Plans: TBD Care Coordinator Barriers to Discharge: Wound  Care Care Coordinator Anticipated Follow Up Needs: HH/OP Expected length of stay: 24-28 days  Clinical Impression SW met with pt at bedside to introduce self, explain role, and discuss discharge process. Pt HCPOA is his dtr [name]. DME: cane and has a built in shower seat in  walk in shower. Pt reports that his wife or one of his daughters transport to medical appointments. Pt is aware SW will follow-up with his wife.   1406- SW left message for pt wife [name] [phone]) to introduce self, explain role, and discuss discharge process.   1433-SW received return phone call from pt wife introducing self. Reports she received statement of service form as she just left the hospital. Wife confirms support from their daughters, and willing to hire help if needed. SW to follow-up after team conference with updates/   Rana Snare 10/15/2020, 2:45 PM

## 2020-10-15 NOTE — Progress Notes (Signed)
Patient found to have an episode of confusion/delirium, his wound vac and dressing pulled out from his right hip post ORIF surgical wound. His gown is off. He denies that he did remove it. Surgical wounds were cleaned and redressed with steri strips and foam dressing. Assisted with urinal. Pain medication given as requested.   Last night (6/14) which is his first night in the floor, he also had episodes of mild agitation when he was found without his gown trying to sit on the edge of the bed saying he wants to go to the BR. He had Miralax last night.

## 2020-10-16 MED ORDER — METHOTREXATE 2.5 MG PO TABS
20.0000 mg | ORAL_TABLET | ORAL | Status: DC
  Administered 2020-10-23 – 2020-10-30 (×2): 20 mg via ORAL
  Filled 2020-10-16 (×2): qty 8

## 2020-10-16 NOTE — Progress Notes (Addendum)
Occupational Therapy TBI Note  Patient Details  Name: Nathan Mitchell MRN: [id number] Date of Birth: [birth year]  Today's Date: 10/16/2020 OT Individual Time: 8115-7262 OT Individual Time Calculation (min): 55 min   Today's Date: 10/16/2020 OT Individual Time: 1500-1600 60 min  Short Term Goals: Week 1:  OT Short Term Goal 1 (Week 1): Pt will stand pivot transfer to BSC/toilet wiht MOD A consistently OT Short Term Goal 2 (Week 1): Pt will thread 1LE into pants OT Short Term Goal 3 (Week 1): Pt will groom wiht S  Skilled Therapeutic Interventions/Progress Updates:     Pt received in bed with 5 out of 10 pain in hip. Rest and repositioning provided for pain relief. Education on use of ice for pain relief. Pt politely declines.  ADL:  Pt grooms at sink with supervision VC for visual deficits seated at sink for face washing and Sfor applying toothpaste today. Pt completes UB dressing with supervision to change shirt.  Pt completes LB dressing with min A to thread BLE for simulated dressing as LPN needs to do foley care and pants need to be off for after session. VC for reacher use and hemi strategy to thread RLE first. Pt completes footwear with total A.  Pt completes toileting with MAX A for components of toileting MIN A for standing balance durign clothing maangement. Pt able to advance pants past hips prior to toileting 75% but needs help last 25%. Pt requires increased time for voiding. Offered stool under RLE for pain relief/repositioning but pt declines. Pt completes toileting transfer with MOD A for power up and post bias as stated below. Guiding A to reach back for arm rests.  Pt completes transfers throughout session with MOD A to power up and MAX A to maintain anterior weight shift from EOB for sit to stand with RW to posterior bias. MIN A pivot steps once balance achieved.    Pt left at end of session in bed for LPN foley care with exit alarm on, call light in reach and all  needs met  Session 2:  Pt received in beed with 3 out of 10 pain in hip. Tylenol provided by LPN for pain relief. Rest and repositioning provided for pain relief as needed.  ADL:  Pt trials footwear. Reporting he does not wear socks. Pt showed/verbally describved shoe funnel. And able to don R shoe without and L shoe wht A to place toes in shoe only   Therapeutic exercise 2x10 towel glides for flexion/extension of knee Pt completes 2x10-15 dowel rod therex for BUE shoulder strengthening required for BADLs/functional transfers as follows with demo cuing and 3 # dowel rod  Shoulder flex/ext, horizonal ab/adduct Circles in B directions Elbow flex/ext, chest press   Therapeutic activity Functional trasnfer training into and out of bed using bed rails and reach back to w/c . Massed practice of transfers to various surfaces for improved carry over.  Pt left at end of session in bed with exit alarm on, call light in reach and all needs met   Therapy Documentation Precautions:  Precautions Precautions: Fall Precaution Comments: foley, T4 compression fx (no brace) Restrictions Weight Bearing Restrictions: No RLE Weight Bearing: Weight bearing as tolerated LLE Weight Bearing: Weight bearing as tolerated General:   Vital Signs: Therapy Vitals Temp: 98.6 F (37 C) Temp Source: Oral Pulse Rate: 88 Resp: 19 BP: 135/86 Patient Position (if appropriate): Lying Oxygen Therapy SpO2: 94 % Pain:   Agitated Behavior Scale: TBI  Observation  Details Observation Environment: CIR Start of observation period - Date: 10/16/20 Start of observation period - Time: 1410 End of observation period - Date: 10/16/20 End of observation period - Time: 1505 Agitated Behavior Scale (DO NOT LEAVE BLANKS) Short attention span, easy distractibility, inability to concentrate: Absent Impulsive, impatient, low tolerance for pain or frustration: Absent Uncooperative, resistant to care, demanding:  Absent Violent and/or threatening violence toward people or property: Absent Explosive and/or unpredictable anger: Absent Rocking, rubbing, moaning, or other self-stimulating behavior: Absent Pulling at tubes, restraints, etc.: Absent Wandering from treatment areas: Absent Restlessness, pacing, excessive movement: Absent Repetitive behaviors, motor, and/or verbal: Absent Rapid, loud, or excessive talking: Absent Sudden changes of mood: Absent Easily initiated or excessive crying and/or laughter: Absent Self-abusiveness, physical and/or verbal: Absent Agitated behavior scale total score: 14  ADL: ADL Eating: Supervision/safety Where Assessed-Eating: Chair Grooming: Minimal assistance Upper Body Bathing: Supervision/safety Where Assessed-Upper Body Bathing: Chair Lower Body Bathing: Maximal assistance Where Assessed-Lower Body Bathing: Chair Upper Body Dressing: Contact guard Where Assessed-Upper Body Dressing: Chair Lower Body Dressing: Dependent Where Assessed-Lower Body Dressing: Chair Toileting: Dependent Where Assessed-Toileting: Bedside Commode Toilet Transfer: Dependent Toilet Transfer Method:  (stedy- MOD A power up) Vision   Perception    Praxis   Exercises:   Other Treatments:     Therapy/Group: Individual Therapy  Tonny Branch 10/16/2020, 8:46 AM

## 2020-10-16 NOTE — Progress Notes (Signed)
Physical Therapy Session Note  Patient Details  Name: Nathan Mitchell MRN: [id number] Date of Birth: Mar 04, 1931  Today's Date: 10/16/2020 PT Individual Time: 1100-1200 PT Individual Time Calculation (min): 60 min   Short Term Goals: Week 1:  PT Short Term Goal 1 (Week 1): Patient will perform bed mobility with min A consistently. PT Short Term Goal 2 (Week 1): Patient will perform basic transfers with min A consistently. PT Short Term Goal 3 (Week 1): Patient will ambulate >25 ft using LRAD. PT Short Term Goal 4 (Week 1): Patient will propel w/c >100 ft  Skilled Therapeutic Interventions/Progress Updates: Pt presents supine in bed and agreeable to therapy.  Pt required manual assist for hooklying to RLE.  Pt able to lift Les to thread cut-off scrub pants and then able to pull up pants to hips and required assist for completing over hips w/ bridging.  Pt performed rolling to left w/ min A.  Pt transferred sit to stand w/ min A and verbal cues for hand placement.  Bed elevated and will measure height of bed per spouse.  Bed at home is very high.  Pt amb x 5' to w/c w/ min A and verbal cues for reciprocal gait (although minimal), including turns to sit.  Pt wheeled to gym for time conservation.  Pt performed calf raises 3 x 15, isometric add w/ ball 3 x 10.  Pt performed sit to stand transfers  4 trials w/ verbal cues for hand placement as well as improved initiation.  Pt amb x 3 of 25' w/ min A and improved step-through gait pattern.  Pt amb in room to recliner.  Pt remained in recliner w/ LEs elevated w/ chair alarm on and all needs in reach.     Therapy Documentation Precautions:  Precautions Precautions: Fall Precaution Comments: foley, T4 compression fx (no brace) Restrictions Weight Bearing Restrictions: No RLE Weight Bearing: Weight bearing as tolerated LLE Weight Bearing: Weight bearing as tolerated General:   Vital Signs:   Pain: pt c/o pain, but unable to quantify. Pain  Assessment Pain Scale: 0-10 Pain Score: 0-No pain Mobility:        Therapy/Group: Individual Therapy  Lucio Edward 10/16/2020, 12:30 PM

## 2020-10-16 NOTE — Progress Notes (Signed)
PROGRESS NOTE   Subjective/Complaints: Seems to have had a better night. Pain controlled. Due for foley change today  ROS: Patient denies fever, rash, sore throat, blurred vision, nausea, vomiting, diarrhea, cough, shortness of breath or chest pain,  or mood change. .   Objective:   No results found. Recent Labs    10/14/20 0511  WBC 8.6  HGB 7.5*  HCT 23.5*  PLT 281   Recent Labs    10/14/20 0511  NA 139  K 4.0  CL 108  CO2 23  GLUCOSE 110*  BUN 21  CREATININE 0.93  CALCIUM 8.2*    Intake/Output Summary (Last 24 hours) at 10/16/2020 1141 Last data filed at 10/16/2020 1020 Gross per 24 hour  Intake 355 ml  Output 1850 ml  Net -1495 ml        Physical Exam: Vital Signs Blood pressure 135/86, pulse 88, temperature 98.6 F (37 C), temperature source Oral, resp. rate 19, height 6\' 1"  (1.854 m), weight 86.4 kg, SpO2 94 %.  Constitutional: No distress . Vital signs reviewed. HEENT: EOMI, oral membranes moist Neck: supple Cardiovascular: RRR without murmur. No JVD    Respiratory/Chest: CTA Bilaterally without wheezes or rales. Normal effort    GI/Abdomen: BS +, non-tender, non-distended Ext: no clubbing, cyanosis, or edema Psych: pleasant and cooperative Skin: diffuse bruising along right trunk and hip. Hip incision, dressing in place with no drainage. Scalp incision with 2 staples Neuro: Pt is cognitively appropriate with normal insight, memory, and awareness. Cranial nerves 2-12 are intact. Sensory exam is normal. Reflexes are 2+ in all 4's. Fine motor coordination is intact. No tremors. Motor function is grossly 5/5 in UE's. RLE 2/5 d/t pain prox to 4/5 distally. LLE 3-5/5 prox to distal.--stable  Musculoskeletal:  right hip tender with rom, edema better  Assessment/Plan: 1. Functional deficits which require 3+ hours per day of interdisciplinary therapy in a comprehensive inpatient rehab  setting. Physiatrist is providing close team supervision and 24 hour management of active medical problems listed below. Physiatrist and rehab team continue to assess barriers to discharge/monitor patient progress toward functional and medical goals  Care Tool:  Bathing    Body parts bathed by patient: Right arm, Left arm, Chest, Abdomen, Front perineal area, Right upper leg, Left upper leg, Face   Body parts bathed by helper: Buttocks, Left lower leg, Right lower leg     Bathing assist Assist Level: Maximal Assistance - Patient 24 - 49% (STS)     Upper Body Dressing/Undressing Upper body dressing   What is the patient wearing?: Pull over shirt    Upper body assist Assist Level: Contact Guard/Touching assist    Lower Body Dressing/Undressing Lower body dressing      What is the patient wearing?: Underwear/pull up, Pants, Incontinence brief     Lower body assist Assist for lower body dressing: Dependent - Patient 0%     Toileting Toileting    Toileting assist Assist for toileting: Dependent - Patient 0%     Transfers Chair/bed transfer  Transfers assist     Chair/bed transfer assist level: Minimal Assistance - Patient > 75% Chair/bed transfer assistive device:  Ambulation assist      Assist level: Minimal Assistance - Patient > 75% Assistive device: Walker-rolling Max distance: 10 ft   Walk 10 feet activity   Assist  Walk 10 feet activity did not occur: Safety/medical concerns (Limited by increased R hip pain)  Assist level: Minimal Assistance - Patient > 75% Assistive device: Walker-rolling   Walk 50 feet activity   Assist Walk 50 feet with 2 turns activity did not occur: Safety/medical concerns         Walk 150 feet activity   Assist Walk 150 feet activity did not occur: Safety/medical concerns         Walk 10 feet on uneven surface  activity   Assist Walk 10 feet on uneven surfaces activity did  not occur: Safety/medical concerns         Wheelchair     Assist Will patient use wheelchair at discharge?: Yes Type of Wheelchair: Manual Wheelchair activity did not occur: Safety/medical concerns (Limited by increased R hip pain during evaluation)  Wheelchair assist level: Contact Guard/Touching assist Max wheelchair distance: 150 ft    Wheelchair 50 feet with 2 turns activity    Assist    Wheelchair 50 feet with 2 turns activity did not occur: Safety/medical concerns   Assist Level: Contact Guard/Touching assist   Wheelchair 150 feet activity     Assist  Wheelchair 150 feet activity did not occur: Safety/medical concerns   Assist Level: Contact Guard/Touching assist   Blood pressure 135/86, pulse 88, temperature 98.6 F (37 C), temperature source Oral, resp. rate 19, height 6\' 1"  (1.854 m), weight 86.4 kg, SpO2 94 %.        Medical Problem List and Plan: 1.  TBI with associated SDH. Also suffered right IT hip fracture, T4 compression fx             -patient may not shower             -ELOS/Goals: MinA 2-3 weeks             --Continue CIR therapies including PT, OT, and SLP   2.  Antithrombotics: -DVT/anticoagulation:  Pharmaceutical: Lovenox bid             -antiplatelet therapy: N/A 3. Pain Management: Schedule tylenol 650mg  TID. LFTs reviewed and wnl. Continue oxycodone/robaxin prn 4. Mood: LCSW to follow for evaluation and support.             -antipsychotic agents: N/A 5. Neuropsych: This patient is not fully capable of making decisions on his own behalf.             --cognition improving.   -continue to monitor sleep chart  -avoid neurosedating meds 6. Skin/Wound Care: foam dressing to right hip incision-minimal to no drainage 7. Fluids/Electrolytes/Nutrition: Appetite has been good per wife. -- encourage PO 8. Right femur fracture: WBAT. Prevena VAC out             --wounds healing nicely  -remove staples from scalp today 9. L-SDH: On  Keppra X 7 days for seizure prophylaxis. Continue until 6/16             10 Giant cell arteritis with visual loss: Was on methotrexate weekly. --Spoke to Dr. 6/16---we will resume MTX next Friday 11. Nickola Major HR tid--incidental finding years ago and has been controlled.             --To follow up with Card? PCP regarding d/c of Xarelto per notes. 12. Constipation: Has  not had BM since admission--eating well, no abdominal pain             --kub with moderate stool  -moved bowels 6/15 and 16 13. Urinary retention: Has failed one voiding trial by Dr. Cardell Peach.             --plans for ultrasound of bladder/prostate? 06/24?             --Continue Flomax at bedtime.  -RN changed foley 6/17 14. Leucocytosis: Resolving without signs of infection             --encourage pulmonary toilet for likely Right atelectasis. 15. ABLA: Continue to monitor H/H with serial checks and monitor for signs of bleeding. --Monitor hematoma right hip./flank.             -6/15 hgb looks to be trending slowly up, 7.5 ---> recheck Monday 16. H/o COPD: Stable. Did not like trial of MDI per wife.   LOS: 3 days A FACE TO FACE EVALUATION WAS PERFORMED  Ranelle Oyster 10/16/2020, 11:41 AM

## 2020-10-16 NOTE — IPOC Note (Signed)
Overall Plan of Care [facility]) Patient Details Name: Nathan Mitchell MRN: [id number] DOB: [birth year]  Admitting Diagnosis: TBI (traumatic brain injury) [facility])  Hospital Problems: Principal Problem:   TBI (traumatic brain injury) [facility])     Functional Problem List: Nursing Bladder, Bowel, Safety, Medication Management, Endurance, Pain, Skin Integrity  PT Balance, Perception, Safety, Behavior, Edema, Sensory, Endurance, Motor, Nutrition, Skin Integrity  OT Balance, Cognition, Endurance, Motor, Pain, Safety, Sensory, Vision  SLP Cognition  TR         Basic ADL's: OT Grooming, Bathing, Dressing     Advanced  ADL's: OT       Transfers: PT Bed Mobility, Bed to Chair, Car, Occupational psychologist, Research scientist (life sciences): PT Ambulation, Psychologist, prison and probation services, Stairs     Additional Impairments: OT    SLP Social Cognition   Memory  TR      Anticipated Outcomes Item Anticipated Outcome  Self Feeding S  Swallowing      Basic self-care  S  Toileting  S   Bathroom Transfers S  Bowel/Bladder  manage bladder with min assist and bowel with mod I  Transfers  supervision using LRAD  Locomotion  Supervision using LRAD 100 ft  Communication     Cognition  Supervision  Pain  at or below level 4  Safety/Judgment  maintain safety wtih cues/reminders   Therapy Plan: PT Intensity: Minimum of 1-2 x/day ,45 to 90 minutes PT Frequency: 5 out of 7 days PT Duration Estimated Length of Stay: 3 weeks OT Intensity: Minimum of 1-2 x/day, 45 to 90 minutes OT Frequency: 5 out of 7 days OT Duration/Estimated Length of Stay: 3-3.5 weeks SLP Intensity: Minumum of 1-2 x/day, 30 to 90 minutes SLP Duration/Estimated Length of Stay: 3-3.5 weeks   Due to the current state of emergency, patients may not be receiving their 3-hours of Medicare-mandated therapy.   Team Interventions: Nursing Interventions Bladder Management, Disease Management/Prevention, Medication Management, Discharge  Planning, Skin Care/Wound Management, Pain Management, Bowel Management, Patient/Family Education  PT interventions Ambulation/gait training, Cognitive remediation/compensation, Discharge planning, DME/adaptive equipment instruction, Functional mobility training, Pain management, Psychosocial support, Splinting/orthotics, Therapeutic Activities, UE/LE Strength taining/ROM, Visual/perceptual remediation/compensation, Wheelchair propulsion/positioning, UE/LE Coordination activities, Therapeutic Exercise, Stair training, Skin care/wound management, Patient/family education, Neuromuscular re-education, Functional electrical stimulation, Disease management/prevention, Firefighter, Warden/ranger  OT Interventions Warden/ranger, Fish farm manager, Patient/family education, Therapeutic Activities, Wheelchair propulsion/positioning, Therapeutic Exercise, Psychosocial support, Functional electrical stimulation, Cognitive remediation/compensation, Community reintegration, Self Care/advanced ADL retraining, UE/LE Strength taining/ROM, Functional mobility training, Discharge planning, Neuromuscular re-education, Skin care/wound managment, Disease mangement/prevention, UE/LE Coordination activities, Pain management, Splinting/orthotics, Visual/perceptual remediation/compensation  SLP Interventions Cognitive remediation/compensation, Environmental controls, Cueing hierarchy, Functional tasks, Patient/family education, Therapeutic Activities  TR Interventions    SW/CM Interventions Discharge Planning, Psychosocial Support, Patient/Family Education   Barriers to Discharge MD  Medical stability  Nursing Decreased caregiver support, Home environment access/layout 2 level level entry B+B on main with spouse  PT Home environment access/layout, Weight bearing restrictions, Behavior    OT Decreased caregiver support, Weight bearing restrictions, Home environment  access/layout, Lack of/limited family support wife unable to provide lifting assistance, 3 daughters in area  SLP      SW Wound Care     Team Discharge Planning: Destination: PT-Home ,OT- Home , SLP-Home Projected Follow-up: PT-Home health PT, OT-  Home health OT, SLP-24 hour supervision/assistance Projected Equipment Needs: PT-Wheelchair (measurements), Rolling walker with 5" wheels, OT- 3 in 1 bedside comode, To be determined, Tub/shower  seat, SLP-None recommended by SLP Equipment Details: PT-will provide w/c measurements when able, OT-pt reports having BSC over toilet but no shower seat Patient/family involved in discharge planning: PT- Patient, Family member/caregiver,  OT-Patient, SLP-Patient  MD ELOS: 3 weeks Medical Rehab Prognosis:  Excellent Assessment: The patient has been admitted for CIR therapies with the diagnosis of TBI with polytrauma. The team will be addressing functional mobility, strength, stamina, balance, safety, adaptive techniques and equipment, self-care, bowel and bladder mgt, patient and caregiver education, NMR, cognition, pain control, community reentry. Goals have been set at supervision for PT, OT, SLP.   Due to the current state of emergency, patients may not be receiving their 3 hours per day of Medicare-mandated therapy.    Ranelle Oyster, MD, FAAPMR     See Team Conference Notes for weekly updates to the plan of care

## 2020-10-17 NOTE — Progress Notes (Signed)
PROGRESS NOTE   Subjective/Complaints:  Eating lunch with set up, no c/os ROS: Patient denies CP, SOB, N/V/D  Objective:   No results found. No results for input(s): WBC, HGB, HCT, PLT in the last 72 hours.  No results for input(s): NA, K, CL, CO2, GLUCOSE, BUN, CREATININE, CALCIUM in the last 72 hours.   Intake/Output Summary (Last 24 hours) at 10/17/2020 1236 Last data filed at 10/17/2020 0919 Gross per 24 hour  Intake 120 ml  Output 1325 ml  Net -1205 ml         Physical Exam: Vital Signs Blood pressure (!) 144/67, pulse 76, temperature 97.8 F (36.6 C), temperature source Oral, resp. rate 20, height 6\' 1"  (1.854 m), weight 84.4 kg, SpO2 99 %.   General: No acute distress Mood and affect are appropriate Heart: Regular rate and rhythm no rubs murmurs or extra sounds Lungs: Clear to auscultation, breathing unlabored, no rales or wheezes Abdomen: Positive bowel sounds, soft nontender to palpation, nondistended Extremities: No clubbing, cyanosis, or edema Skin: No evidence of breakdown, no evidence of rash  Neuro: Pt is cognitively appropriate with normal insight, memory, and awareness. Cranial nerves 2-12 are intact. Sensory exam is normal. Reflexes are 2+ in all 4's. Fine motor coordination is intact. No tremors. Motor function is grossly 5/5 in UE's. RLE 2/5 d/t pain prox to 4/5 distally. LLE 3-5/5 prox to distal.--stable  Musculoskeletal:  right hip tender with rom, edema better  Assessment/Plan: 1. Functional deficits which require 3+ hours per day of interdisciplinary therapy in a comprehensive inpatient rehab setting. Physiatrist is providing close team supervision and 24 hour management of active medical problems listed below. Physiatrist and rehab team continue to assess barriers to discharge/monitor patient progress toward functional and medical goals  Care Tool:  Bathing    Body parts bathed by  patient: Right arm, Left arm, Chest, Abdomen, Front perineal area, Right upper leg, Left upper leg, Face   Body parts bathed by helper: Buttocks, Left lower leg, Right lower leg     Bathing assist Assist Level: Maximal Assistance - Patient 24 - 49% (STS)     Upper Body Dressing/Undressing Upper body dressing   What is the patient wearing?: Pull over shirt    Upper body assist Assist Level: Contact Guard/Touching assist    Lower Body Dressing/Undressing Lower body dressing      What is the patient wearing?: Underwear/pull up, Pants, Incontinence brief     Lower body assist Assist for lower body dressing: Dependent - Patient 0%     Toileting Toileting    Toileting assist Assist for toileting: Dependent - Patient 0%     Transfers Chair/bed transfer  Transfers assist     Chair/bed transfer assist level: Minimal Assistance - Patient > 75% Chair/bed transfer assistive device:   Ambulation assist      Assist level: Minimal Assistance - Patient > 75% Assistive device: Walker-rolling Max distance: 25   Walk 10 feet activity   Assist  Walk 10 feet activity did not occur: Safety/medical concerns (Limited by increased R hip pain)  Assist level: Minimal Assistance - Patient > 75% Assistive device: Walker-rolling  Walk 50 feet activity   Assist Walk 50 feet with 2 turns activity did not occur: Safety/medical concerns         Walk 150 feet activity   Assist Walk 150 feet activity did not occur: Safety/medical concerns         Walk 10 feet on uneven surface  activity   Assist Walk 10 feet on uneven surfaces activity did not occur: Safety/medical concerns         Wheelchair     Assist Will patient use wheelchair at discharge?: Yes Type of Wheelchair: Manual Wheelchair activity did not occur: Safety/medical concerns (Limited by increased R hip pain during evaluation)  Wheelchair assist level: Contact  Guard/Touching assist Max wheelchair distance: 150 ft    Wheelchair 50 feet with 2 turns activity    Assist    Wheelchair 50 feet with 2 turns activity did not occur: Safety/medical concerns   Assist Level: Contact Guard/Touching assist   Wheelchair 150 feet activity     Assist  Wheelchair 150 feet activity did not occur: Safety/medical concerns   Assist Level: Contact Guard/Touching assist   Blood pressure (!) 144/67, pulse 76, temperature 97.8 F (36.6 C), temperature source Oral, resp. rate 20, height 6\' 1"  (1.854 m), weight 84.4 kg, SpO2 99 %.        Medical Problem List and Plan: 1.  TBI with associated SDH. Also suffered right IT hip fracture, T4 compression fx             -patient may not shower             -ELOS/Goals: MinA 2-3 weeks             --Continue CIR therapies including PT, OT, and SLP   2.  Antithrombotics: -DVT/anticoagulation:  Pharmaceutical: Lovenox bid             -antiplatelet therapy: N/A 3. Pain Management: Schedule tylenol 650mg  TID. LFTs reviewed and wnl. Continue oxycodone/robaxin prn 4. Mood: LCSW to follow for evaluation and support.             -antipsychotic agents: N/A 5. Neuropsych: This patient is not fully capable of making decisions on his own behalf.             --cognition improving.   -continue to monitor sleep chart  -avoid neurosedating meds 6. Skin/Wound Care: foam dressing to right hip incision-minimal to no drainage 7. Fluids/Electrolytes/Nutrition: Appetite has been good per wife. -- encourage PO 8. Right femur fracture: WBAT. Prevena VAC out             --wounds healing nicely  -remove staples from scalp today 9. 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 off since 6/16  no seizures            10 Giant cell arteritis with visual loss: Was on methotrexate weekly. --Spoke to Dr. 6/16---we will resume MTX next Friday 11. Nickola Major HR tid--incidental finding years ago and has been controlled.              --To follow up with Card? PCP regarding d/c of Xarelto per notes. 12. Constipation: Has not had BM since admission--eating well, no abdominal pain             --kub with moderate stool  -moved bowels 6/15 and 16 13. Urinary retention: Has failed one voiding trial by Dr. [id number].             --plans for ultrasound of  bladder/prostate? 06/24?             --Continue Flomax at bedtime.  -RN changed foley 6/17 14. Leucocytosis: Resolving without signs of infection             --encourage pulmonary toilet for likely Right atelectasis. 15. ABLA: Continue to monitor H/H with serial checks and monitor for signs of bleeding. --Monitor hematoma right hip./flank.             -6/15 hgb looks to be trending slowly up, 7.5 ---> recheck Monday 16. H/o COPD: Stable. Did not like trial of MDI per wife.   LOS: 4 days A FACE TO FACE EVALUATION WAS PERFORMED  Erick Colace 10/17/2020, 12:36 PM

## 2020-10-18 NOTE — Plan of Care (Signed)
Behavioral Plan    Rancho Level: Rancho VII   Behavior to decrease/ eliminate: execute safety plan; decrease fall risk, pain     Changes to environment:  Blinds open during day, shut at night Chair alarm when OOB Offer to play audio book during the day   Interventions: Sleep/wake chart Pain medicine prior to therapies Provide rest between therapy sessions Apply ice packs PRN to R hip   Recommendations for interactions with patient: Call light in reach at every encounter       Attendees:  Dois Davenport D.OT Sheliah Mends. PT

## 2020-10-18 NOTE — Progress Notes (Signed)
Occupational Therapy Session Note  Patient Details  Name: Nathan Mitchell MRN: [id number] Date of Birth: August 13, 1930  Today's Date: 10/18/2020 OT Individual Time: 1005-1059 and 1300-1356 OT Individual Time Calculation (min): 54 min and 56 min  Short Term Goals: Week 1:  OT Short Term Goal 1 (Week 1): Pt will stand pivot transfer to BSC/toilet wiht MOD A consistently OT Short Term Goal 2 (Week 1): Pt will thread 1LE into pants OT Short Term Goal 3 (Week 1): Pt will groom wiht S  Skilled Therapeutic Interventions/Progress Updates:    Pt greeted in bed, listening to his audiobook. He reported that pain from this AM had absolved. Agreeable to engage in bathing/dressing tasks EOB at sit<stand level using RW. Pt required locating cues to find needed ADL/grooming items due to blindness. Min-mod manual cues to offset posterior lean/LOB during EOB activity. Heavy assistance to stabilize the RW during sit<stands with max cuing for proper hand placement. Min A for power ups and Mod balance assistance while helped with LB self care. Pt unable to functionally reach his feet, assistance needed for threading LEs into pants and also donning footwear. Mod cuing for orienting clothing on his own when presented with shirt + pants. Mod A for stand pivot<w/c using the RW with pt having posterior lean/balance losses. He remained comfortably in the recliner at close of tx, all needs within reach and safety belt fastened.   OT added colorful tape to bedrail and also RW handles (to know where to place hands safely when standing with device). Pt able to distinguish these colors and tell OT which colors provided the most contrast when placed over bedrail + walker.  2nd Session 1:1 tx (56 min) Pt greeted in bed and agreeable to tx. ADL needs met. Increased time and vcs for supine<sit while using the bedrail. Pt had an easier time finding it using the orange tape on the rail. While EOB, blocked practice sit<stands to  improve technique and safety for carryover during functional transfers. Worked on scooting forward, anterior weight shifting, and 1 hand on the handle while 2nd hand was on the bed for optimal power up. Pt able to complete with Min-Mod A initially, progressing to Min A when using proper techniques and taking his time. Prolonged rest breaks in between stands due to Rt hip pain and fatigue. RN in during session to provide tylenol. While seated guided pt through seated marches, toe taps, heel lifts, and long arc quads until reaching the point of fatigue. Decreased ROM/endurance on the Rt side. At end of session pt scooted up towards [facility] with supervision and was able to boost himself up with bed placed in trendelenburg position. Left him comfortably in bed with all needs within reach and bed alarm set.   Therapy Documentation Precautions:  Precautions Precautions: Fall Precaution Comments: foley, T4 compression fx (no brace) Restrictions Weight Bearing Restrictions: No RLE Weight Bearing: Weight bearing as tolerated LLE Weight Bearing: Weight bearing as tolerated    ADL: ADL Eating: Supervision/safety Where Assessed-Eating: Chair Grooming: Minimal assistance Upper Body Bathing: Supervision/safety Where Assessed-Upper Body Bathing: Chair Lower Body Bathing: Maximal assistance Where Assessed-Lower Body Bathing: Chair Upper Body Dressing: Contact guard Where Assessed-Upper Body Dressing: Chair Lower Body Dressing: Dependent Where Assessed-Lower Body Dressing: Chair Toileting: Dependent Where Assessed-Toileting: Bedside Commode Toilet Transfer: Dependent Toilet Transfer Method:  (stedy- MOD A power up)     Therapy/Group: Individual Therapy  Calianna Kim A Talaysia Pinheiro 10/18/2020, 12:53 PM

## 2020-10-18 NOTE — Progress Notes (Signed)
Physical Therapy TBI Note  Patient Details  Name: Nathan Mitchell MRN: [id number] Date of Birth: 10/15/1930  Today's Date: 10/18/2020 PT Individual Time: 0800-0900 and 1500-1526 PT Individual Time Calculation (min): 60 min and 26 min  Short Term Goals: Week 1:  PT Short Term Goal 1 (Week 1): Patient will perform bed mobility with min A consistently. PT Short Term Goal 2 (Week 1): Patient will perform basic transfers with min A consistently. PT Short Term Goal 3 (Week 1): Patient will ambulate >25 ft using LRAD. PT Short Term Goal 4 (Week 1): Patient will propel w/c >100 ft  Skilled Therapeutic Interventions/Progress Updates:     Session 1: Patient in bed upon PT arrival. Patient alert and agreeable to PT session. Patient reported 6/10 R hip pain during session, RN made aware. PT provided repositioning, rest breaks, and distraction as pain interventions throughout session.   Focused session on pain management and edema control due to elevated pain levels at rest, as patient reports increased pain with weight bearing.  Applied Kinesiotape over R hip and R flank using lymphatic drainage technique with the aim of reducing edema and ecchymosis . Prior to application, patient denied any history of skin irritation or allergy to adhesive. Educated patient on purpose of kinesiotape placement and signs symptoms of allergic reaction or irritation. Cleaned patient's skin and applied test strip at beginning of session and removed at end of session without sings of skin irritation. Instructed patient that the tape can be left on up to 3 days and can be worn in the shower. Instructed to removed the tape if peeling off, signs of skin irritation arise, or it has been on for >3 days. Informed patient that the tape is best removed in the shower or with a wet wash cloth. Patient stated understanding. Rehab team informed about tape placement to assist with monitoring patient's response.  Discussed patient's  daily routine, sitting surfaces, and daily mobility at home PTA and modifications with present deficits and PT goals to return to daily activities during Kinesiotape application.   Therapeutic Activity: Bed Mobility: Patient performed rolling L with min A for R lower extremity management and min use of bed rail with a pillow placed between his knees for reduces later hip pain with positioning. Patient tolerated this position >6 min without increased pain. Patient performed scooting up in the bed with supervision after providing hand over hand assist for reaching to the bar over head and cues for bending his L leg and pushing his hips up while pulling up in the bed.   Therapeutic Exercise: Patient performed the following exercises with verbal and tactile cues for proper technique. -ankle pumps x20 -R quad sets x10 with 5 sec hold -B hip abduction x10 with 5 sec hold -R heel slides x10 with >50% assist -R hip abduction/adduction x10 with <25% assist -R SLR x10 with >50% assist -B glute sets x10 with 5 sec hold  Patient in bed set up to listen to his book on tape at end of session with breaks locked, bed alarm set, and all needs within reach.   Session 2: Patient in bed upon PT arrival. Patient alert and agreeable to PT session. Patient reported 4-5/10 R hip pain during session, RN made aware. PT provided repositioning, rest breaks, and distraction as pain interventions throughout session.   Therapeutic Activity: Bed Mobility: Patient performed supine to/from sit in a flat bed without use of bed rails with min A for R lower extremity management  and CGA for trunk stabilization when coming to sitting. Provided verbal cues for use of elbows into the bed to push-up to sitting. Transfers: Patient performed sit to/from stand x2 with CGA using RW. Able to teach back hand placement with min cues and recalled forward weight shift as a cue from previous OT session.  Gait Training:  Patient ambulated 36  feet with 2x180 deg turns using RW with CGA for safety/balance and min A on turns. Ambulated with narrow BOS, step-to gait pattern leading with R, increased upper extremity use, forward trunk flexion, and antalgic gait on R. Provided verbal cues for erect posture, reciprocal gait pattern as able, shoulder depression for increased back muscle engagement to support upper extremities, and focused on increased BOS, especially on turns and with fatigue.  Discussed home set-up, patient's wife reports 4 ft high bed that can be lowered, reports that he sits in a rocking chair, recliner, and kitchen stool. Will provide home measurement sheet to family to simulate home furniture prior to d/c.   Patient in bed with his wife and a visitor in the room at end of session with breaks locked, bed alarm set, and all needs within reach.   Therapy Documentation Precautions:  Precautions Precautions: Fall Precaution Comments: foley, T4 compression fx (no brace) Restrictions Weight Bearing Restrictions: No RLE Weight Bearing: Weight bearing as tolerated LLE Weight Bearing: Weight bearing as tolerated  Agitated Behavior Scale: TBI Observation Details Observation Environment: Patient's room Start of observation period - Date: 10/18/20 Start of observation period - Time: 0800 End of observation period - Date: 10/18/20 End of observation period - Time: 0900 Agitated Behavior Scale (DO NOT LEAVE BLANKS) Short attention span, easy distractibility, inability to concentrate: Absent Impulsive, impatient, low tolerance for pain or frustration: Absent Uncooperative, resistant to care, demanding: Absent Violent and/or threatening violence toward people or property: Absent Explosive and/or unpredictable anger: Absent Rocking, rubbing, moaning, or other self-stimulating behavior: Absent Pulling at tubes, restraints, etc.: Absent Wandering from treatment areas: Absent Restlessness, pacing, excessive movement:  Absent Repetitive behaviors, motor, and/or verbal: Absent Rapid, loud, or excessive talking: Absent Sudden changes of mood: Absent Easily initiated or excessive crying and/or laughter: Absent Self-abusiveness, physical and/or verbal: Absent Agitated behavior scale total score: 14 Agitated Behavior Scale: TBI Observation Details Observation Environment: Patient's room Start of observation period - Date: 10/18/20 Start of observation period - Time: 1500  End of observation period - Date: 10/18/20 End of observation period - Time: 1526 Agitated Behavior Scale (DO NOT LEAVE BLANKS) Short attention span, easy distractibility, inability to concentrate: Absent Impulsive, impatient, low tolerance for pain or frustration: Absent Uncooperative, resistant to care, demanding: Absent Violent and/or threatening violence toward people or property: Absent Explosive and/or unpredictable anger: Absent Rocking, rubbing, moaning, or other self-stimulating behavior: Absent Pulling at tubes, restraints, etc.: Absent Wandering from treatment areas: Absent Restlessness, pacing, excessive movement: Absent Repetitive behaviors, motor, and/or verbal: Absent Rapid, loud, or excessive talking: Absent Sudden changes of mood: Absent Easily initiated or excessive crying and/or laughter: Absent Self-abusiveness, physical and/or verbal: Absent Agitated behavior scale total score: 14     Therapy/Group: Individual Therapy  Tytus Strahle L Jamecia Lerman PT, DPT  10/18/2020, 3:35 PM

## 2020-10-19 DIAGNOSIS — S069X0D Unspecified intracranial injury without loss of consciousness, subsequent encounter: Secondary | ICD-10-CM

## 2020-10-19 LAB — CBC
HCT: 27.3 % — ABNORMAL LOW (ref 39.0–52.0)
Hemoglobin: 8.3 g/dL — ABNORMAL LOW (ref 13.0–17.0)
MCH: 29.3 pg (ref 26.0–34.0)
MCHC: 30.4 g/dL (ref 30.0–36.0)
MCV: 96.5 fL (ref 80.0–100.0)
Platelets: 359 10*3/uL (ref 150–400)
RBC: 2.83 MIL/uL — ABNORMAL LOW (ref 4.22–5.81)
RDW: 16.8 % — ABNORMAL HIGH (ref 11.5–15.5)
WBC: 8.5 10*3/uL (ref 4.0–10.5)
nRBC: 0 % (ref 0.0–0.2)

## 2020-10-19 LAB — BASIC METABOLIC PANEL
Anion gap: 8 (ref 5–15)
BUN: 14 mg/dL (ref 8–23)
CO2: 27 mmol/L (ref 22–32)
Calcium: 8.7 mg/dL — ABNORMAL LOW (ref 8.9–10.3)
Chloride: 104 mmol/L (ref 98–111)
Creatinine, Ser: 1.03 mg/dL (ref 0.61–1.24)
GFR, Estimated: >60 mL/min (ref >60)
Glucose, Bld: 100 mg/dL — ABNORMAL HIGH (ref 70–99)
Potassium: 3.8 mmol/L (ref 3.5–5.1)
Sodium: 139 mmol/L (ref 135–145)

## 2020-10-19 MED ORDER — FOLIC ACID 1 MG PO TABS
1.0000 mg | ORAL_TABLET | Freq: Every day | ORAL | Status: DC
  Administered 2020-10-20 – 2020-11-04 (×16): 1 mg via ORAL
  Filled 2020-10-19 (×16): qty 1

## 2020-10-19 MED ORDER — VITAMIN B-12 100 MCG PO TABS
100.0000 ug | ORAL_TABLET | Freq: Every day | ORAL | Status: DC
  Administered 2020-10-20 – 2020-11-04 (×16): 100 ug via ORAL
  Filled 2020-10-19 (×18): qty 1

## 2020-10-19 MED ORDER — FERROUS GLUCONATE 324 (38 FE) MG PO TABS
324.0000 mg | ORAL_TABLET | ORAL | Status: DC
  Administered 2020-10-21 – 2020-11-04 (×7): 324 mg via ORAL
  Filled 2020-10-19 (×9): qty 1

## 2020-10-19 NOTE — Progress Notes (Addendum)
  Methotrexate (Trexall; Rheumatrex) hold criteria Hgb < 8: Hgb 8.3 WBC < 3: WBC 8.5 Pltc < 100K: Plts 359 SCr > 1.5x baseline (or > 2 if baseline unknown): Scr 1 AST or ALT >3x ULN: Watch ALT 62 Bili > 1.5x ULN: Tbili 2.2 (this is eactly 1.5 ULN) Ascites or pleural effusion: No Diarrhea - Grade 2 or higher: No Ulcerative stomatitis: No Unexplained pneumonitis / hypoxemia: No Active infection: No   Plan:  F/u recheck ofTbili on 6/23 prior to resuming MTX on 6/24 2. Increase FA back to PTA 1mg  dose required for methotrexate therapy. 3. Consider resuming Ferrous gluconate QMWF and Vitamin B 12--held currently with ABLA.  Emary Zalar S. , PharmD, BCPS Clinical Staff Pharmacist [email or web address]

## 2020-10-19 NOTE — Progress Notes (Signed)
Occupational Therapy TBI Note  Patient Details  Name: Nathan Mitchell MRN: [id number] Date of Birth: Apr 20, 1931  Today's Date: 10/19/2020 OT Individual Time: 6389-3734 OT Individual Time Calculation (min): 30 min   Session 2: OT Individual Time: 2876-8115 OT Individual Time Calculation (min): 70 min    Short Term Goals: Week 1:  OT Short Term Goal 1 (Week 1): Pt will stand pivot transfer to BSC/toilet wiht MOD A consistently OT Short Term Goal 2 (Week 1): Pt will thread 1LE into pants OT Short Term Goal 3 (Week 1): Pt will groom wiht S  Skilled Therapeutic Interventions/Progress Updates:    Session 1: Pt received supine with no c/o pain at rest. Pt completed bed mobility with assist required for RLE management for pain with movement, as well as min A to boost trunk. Pt was able to stand from the EOB with cueing for hand placement with mod A for steadying assist d/t posterior bias at times. He was able to manage balance perturbations once standing with BUE support on the RW and required only min A. He pivoted to the w/c with min A and min cueing for sequencing. He completed oral care at the sink with min cueing/set up assist to locate toothbrush and apply toothpaste. He completed ~5 ft of functional mobility back to the recliner with min A using the RW. He was left sitting up with all needs met, chair alarm belt fastened.    Session 2:  Pt received sitting in the recliner with his wife Perrin Smack present. Kitty reporting that pt was having difficulty eating d/t visual deficits but pt adamantly denying this and declining use of a plate guard. Pt excited to take shower. Pt's back was damp from sweat but he reported only that his hands were cold. He completed sit > stand with min A from the recliner with cueing required for hand placement. He completed ambulatory transfer into the bathroom with min A, mod guiding cues d/t visual deficits. Pt completed transfer to [facility] over the toilet with mod A.  He required mod A for clothing management and toileting tasks. Loose BM voided and 600 cc emptied from foley catheter- NT notified. Pt completed transfer into the walk in shower with min A using RW. He often requires cueing for hand placement during sit <> stands. Pt completed all bathing seated on TTB- min A for peri hygiene and distal reaching. Pt returned to w/c and donned shirt with min A. Mod A to don pants- to thread over feet and pull up in standing. Pt completed transfer back to bed and was left supine with all needs met, bed alarm set.   Pt with SOB throughout session. Unreliable Spo2 and HR from dynamap- often starting at 82% Spo2 and jumping to 92% quickly with irregular pleth. HR also fluctuated greatly- 60- 120 bpm    Therapy Documentation Precautions:  Precautions Precautions: Fall Precaution Comments: foley, T4 compression fx (no brace) Restrictions Weight Bearing Restrictions: No RLE Weight Bearing: Weight bearing as tolerated LLE Weight Bearing: Weight bearing as tolerated  Pain Assessment Pain Scale: 0-10 Pain Score: 0-No pain Agitated Behavior Scale: TBI Observation Details Observation Environment: Patient room Start of observation period - Date: 10/19/20 Start of observation period - Time: 0955 End of observation period - Date: 10/19/20 End of observation period - Time: 1025 Agitated Behavior Scale (DO NOT LEAVE BLANKS) Short attention span, easy distractibility, inability to concentrate: Absent Impulsive, impatient, low tolerance for pain or frustration: Absent Uncooperative, resistant to  care, demanding: Absent Violent and/or threatening violence toward people or property: Absent Explosive and/or unpredictable anger: Absent Rocking, rubbing, moaning, or other self-stimulating behavior: Absent Pulling at tubes, restraints, etc.: Absent Wandering from treatment areas: Absent Restlessness, pacing, excessive movement: Absent Repetitive behaviors, motor, and/or  verbal: Absent Rapid, loud, or excessive talking: Absent Sudden changes of mood: Absent Easily initiated or excessive crying and/or laughter: Absent Self-abusiveness, physical and/or verbal: Absent Agitated behavior scale total score: 14   Therapy/Group: Individual Therapy  Curtis Sites 10/19/2020, 12:50 PM

## 2020-10-19 NOTE — Progress Notes (Signed)
Physical Therapy TBI Note  Patient Details  Name: Nathan Mitchell MRN: [id number] Date of Birth: 1930/11/17  Today's Date: 10/19/2020 PT Individual Time: 0800-0900 PT Individual Time Calculation (min): 60 min   Short Term Goals: Week 1:  PT Short Term Goal 1 (Week 1): Patient will perform bed mobility with min A consistently. PT Short Term Goal 2 (Week 1): Patient will perform basic transfers with min A consistently. PT Short Term Goal 3 (Week 1): Patient will ambulate >25 ft using LRAD. PT Short Term Goal 4 (Week 1): Patient will propel w/c >100 ft  Skilled Therapeutic Interventions/Progress Updates:     Patient in bed eating breakfast upon PT arrival. Patient alert and agreeable to PT session. Patient reported 4-5/10 R hip pain during session, RN made aware. PT provided repositioning, rest breaks, and distraction as pain interventions throughout session. Patient had slid down in the bed while eating and agreeable to sitting up to finish.  When sitting EOB, patient very SOB, HR irregular between 55 and 112 bpm SPO2 94% via pulse ox, 72 bpm and irregular with manual pulse. RN made aware and provided morning medications. Once in the recliner and provided ~5 min seated rest, BP 115/80, HR 99 and regular, SPO2 98%.  Therapeutic Activity: Bed Mobility: Patient performed supine to/from sit with min A-CGA for R lower extremity management. Provided verbal cues for use of elbows to push to sit up. Transfers: Patient performed stand pivot bed>recliner and BSC over toilet>w/c using RW with min A to stand from an elevated bed and CGA to pivot. Provided cues for pivot rather than step technique due to increased pain, 8/10, with weight bearing on R. He performed sit to/from stand x1 from recliner with min A using RW and increased time to transfer L hand to RW due to balance with little to no weight on R foot due to pain. Provided verbal cues for forward weight shift and R foot ahead to tolerate partial  weight bearing for improved balance. Patient performed ambulatory transfer to the bathroom with min A using RW. Ambulated with antalgic gait on R with minimal weight bearing, step-to gait pattern, and increased use of upper extremities. Provided mod cues for navigation and erect posture. Patient was continent of bowl during toileting, mild incontinence in the bed with smear residue. Performed peri-care and lower body clothing management with total A due to pain and balance deficits.   Therapeutic Exercise: Patient performed the following exercises with verbal and tactile cues for proper technique. -R seated hip flexion 2x5 with 25% AAROM -R seated knee flexion stretch 2x30 sec, just past 90 deg -R LAQ 2x10 AROM -seated hip abduction/adduction x10  Patient in bed at end of session with breaks locked, bed alarm set, and all needs within reach.   Therapy Documentation Precautions:  Precautions Precautions: Fall Precaution Comments: foley, T4 compression fx (no brace) Restrictions Weight Bearing Restrictions: No RLE Weight Bearing: Weight bearing as tolerated LLE Weight Bearing: Weight bearing as tolerated  Agitated Behavior Scale: TBI Observation Details Observation Environment: Pt room Start of observation period - Date: 10/19/20 Start of observation period - Time: 0800 End of observation period - Date: 10/19/20 End of observation period - Time: 0900 Agitated Behavior Scale (DO NOT LEAVE BLANKS) Short attention span, easy distractibility, inability to concentrate: Absent Impulsive, impatient, low tolerance for pain or frustration: Absent Uncooperative, resistant to care, demanding: Absent Violent and/or threatening violence toward people or property: Absent Explosive and/or unpredictable anger: Absent Rocking, rubbing, moaning,  or other self-stimulating behavior: Absent Pulling at tubes, restraints, etc.: Absent Wandering from treatment areas: Absent Restlessness, pacing,  excessive movement: Absent Repetitive behaviors, motor, and/or verbal: Absent Rapid, loud, or excessive talking: Absent Sudden changes of mood: Absent Easily initiated or excessive crying and/or laughter: Absent Self-abusiveness, physical and/or verbal: Absent Agitated behavior scale total score: 14     Therapy/Group: Individual Therapy  [place] L Haniyah Maciolek PT, DPT  10/19/2020, 4:06 PM

## 2020-10-19 NOTE — Progress Notes (Signed)
Speech Language Pathology TBI Note  Patient Details  Name: Nathan Mitchell MRN: [id number] Date of Birth: 11-24-1930  Today's Date: 10/19/2020 SLP Individual Time: 1100-1200 SLP Individual Time Calculation (min): 60 min  Short Term Goals: Week 1: SLP Short Term Goal 1 (Week 1): Patient will recall daily, functional information with Min verbal cues for use of strategies. SLP Short Term Goal 2 (Week 1): Patient will identify 1 cognitive deficit since admission with Min verbal cues.  Skilled Therapeutic Interventions: Skilled SLP treatment performed with focus on cognitive goals. Patient was unable to identify cognitive deficits since admission, by expressing "there doesn't seem to be anything wrong." SLP provided education on results of initial evaluation and patient appeared surprised to hear report of reduced working and short-term recall in particular. SLP facilitated working memory and Biomedical scientist activity with min-to-mod A verbal cues. Patient was left in recliner chair with alarm activated and all needs within reach. Continue with current plan of care.    Pain Pain Assessment Pain Scale: 0-10 Pain Score: 0-No pain  Agitated Behavior Scale: TBI Observation Details Observation Environment: Patient room Start of observation period - Date: 10/19/20 Start of observation period - Time: 1100 End of observation period - Date: 10/19/20 End of observation period - Time: 1200 Agitated Behavior Scale (DO NOT LEAVE BLANKS) Short attention span, easy distractibility, inability to concentrate: Absent Impulsive, impatient, low tolerance for pain or frustration: Absent Uncooperative, resistant to care, demanding: Absent Violent and/or threatening violence toward people or property: Absent Explosive and/or unpredictable anger: Absent Rocking, rubbing, moaning, or other self-stimulating behavior: Absent Pulling at tubes, restraints, etc.: Absent Wandering from treatment areas:  Absent Restlessness, pacing, excessive movement: Absent Repetitive behaviors, motor, and/or verbal: Absent Rapid, loud, or excessive talking: Absent Sudden changes of mood: Absent Easily initiated or excessive crying and/or laughter: Absent Self-abusiveness, physical and/or verbal: Absent Agitated behavior scale total score: 14  Therapy/Group: Individual Therapy  Tamala Ser 10/19/2020, 12:36 PM

## 2020-10-19 NOTE — Progress Notes (Signed)
PROGRESS NOTE   Subjective/Complaints: Doing well. Has no questions. Working with SLP Appreciate pharmacy note.   ROS: Patient denies CP, SOB, N/V/D  Objective:   No results found. Recent Labs    10/19/20 0624  WBC 8.5  HGB 8.3*  HCT 27.3*  PLT 359   Recent Labs    10/19/20 0624  NA 139  K 3.8  CL 104  CO2 27  GLUCOSE 100*  BUN 14  CREATININE 1.03  CALCIUM 8.7*    Intake/Output Summary (Last 24 hours) at 10/19/2020 1429 Last data filed at 10/19/2020 1407 Gross per 24 hour  Intake 960 ml  Output 2000 ml  Net -1040 ml        Physical Exam: Vital Signs Blood pressure 128/69, pulse 89, temperature 98.7 F (37.1 C), temperature source Oral, resp. rate 17, height 6\' 1"  (1.854 m), weight 80.7 kg, SpO2 98 %. Gen: no distress, normal appearing HEENT: oral mucosa pink and moist, NCAT Cardio: Reg rate Chest: normal effort, normal rate of breathing Abd: soft, non-distended Ext: no edema Psych: pleasant, normal affect Skin: intact  Neuro: Pt is cognitively appropriate with normal insight, memory, and awareness. Cranial nerves 2-12 are intact. Sensory exam is normal. Reflexes are 2+ in all 4's. Fine motor coordination is intact. No tremors. Motor function is grossly 5/5 in UE's. RLE 2/5 d/t pain prox to 4/5 distally. LLE 3-5/5 prox to distal.--stable  Musculoskeletal:  right hip tender with rom, edema better  Assessment/Plan: 1. Functional deficits which require 3+ hours per day of interdisciplinary therapy in a comprehensive inpatient rehab setting. Physiatrist is providing close team supervision and 24 hour management of active medical problems listed below. Physiatrist and rehab team continue to assess barriers to discharge/monitor patient progress toward functional and medical goals  Care Tool:  Bathing    Body parts bathed by patient: Right arm, Left arm, Chest, Abdomen, Front perineal area, Right  upper leg, Left upper leg, Face   Body parts bathed by helper: Buttocks, Left lower leg, Right lower leg     Bathing assist Assist Level: Maximal Assistance - Patient 24 - 49% (STS)     Upper Body Dressing/Undressing Upper body dressing   What is the patient wearing?: Pull over shirt    Upper body assist Assist Level: Contact Guard/Touching assist    Lower Body Dressing/Undressing Lower body dressing      What is the patient wearing?: Underwear/pull up, Pants, Incontinence brief     Lower body assist Assist for lower body dressing: Dependent - Patient 0%     Toileting Toileting    Toileting assist Assist for toileting: Dependent - Patient 0%     Transfers Chair/bed transfer  Transfers assist     Chair/bed transfer assist level: Minimal Assistance - Patient > 75% Chair/bed transfer assistive device:   Ambulation assist      Assist level: Contact Guard/Touching assist Assistive device: Walker-rolling Max distance: 36 ft   Walk 10 feet activity   Assist  Walk 10 feet activity did not occur: Safety/medical concerns (Limited by increased R hip pain)  Assist level: Contact Guard/Touching assist Assistive device: Walker-rolling   Walk  50 feet activity   Assist Walk 50 feet with 2 turns activity did not occur: Safety/medical concerns         Walk 150 feet activity   Assist Walk 150 feet activity did not occur: Safety/medical concerns         Walk 10 feet on uneven surface  activity   Assist Walk 10 feet on uneven surfaces activity did not occur: Safety/medical concerns         Wheelchair     Assist Will patient use wheelchair at discharge?: Yes Type of Wheelchair: Manual Wheelchair activity did not occur: Safety/medical concerns (Limited by increased R hip pain during evaluation)  Wheelchair assist level: Contact Guard/Touching assist Max wheelchair distance: 150 ft    Wheelchair 50 feet with 2  turns activity    Assist    Wheelchair 50 feet with 2 turns activity did not occur: Safety/medical concerns   Assist Level: Contact Guard/Touching assist   Wheelchair 150 feet activity     Assist  Wheelchair 150 feet activity did not occur: Safety/medical concerns   Assist Level: Contact Guard/Touching assist   Blood pressure 128/69, pulse 89, temperature 98.7 F (37.1 C), temperature source Oral, resp. rate 17, height 6\' 1"  (1.854 m), weight 80.7 kg, SpO2 98 %.        Medical Problem List and Plan: 1.  TBI with associated SDH. Also suffered right IT hip fracture, T4 compression fx             -patient may not shower             -ELOS/Goals: MinA 2-3 weeks             -Continue CIR therapies including PT, OT, and SLP   2.  Antithrombotics: -DVT/anticoagulation:  Pharmaceutical: Lovenox bid             -antiplatelet therapy: N/A 3. Pain Management: Schedule tylenol 650mg  TID. LFTs reviewed and wnl. Continue oxycodone/robaxin prn 4. Mood: LCSW to follow for evaluation and support.             -antipsychotic agents: N/A 5. Neuropsych: This patient is not fully capable of making decisions on his own behalf.             --cognition improving.   -continue to monitor sleep chart  -avoid neurosedating meds 6. Skin/Wound Care: foam dressing to right hip incision-minimal to no drainage 7. Fluids/Electrolytes/Nutrition: Appetite has been good per wife. -- encourage PO 8. Right femur fracture: WBAT. Prevena VAC out             --wounds healing nicely  -remove staples from scalp today 9. 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 off since 6/16  no seizures            10 Giant cell arteritis with visual loss: Was on methotrexate weekly. --Spoke to Dr. 6/16---we will resume MTX 6/24. Recheck Tbili on 6/23 prior to resuming. Ordered. Folic acid changed back to 1mg  dose.  11. 7/24 HR tid--incidental finding years ago and has been controlled.              --To follow up with Card? PCP regarding d/c of Xarelto per notes. 12. Constipation: Has not had BM since admission--eating well, no abdominal pain             --kub with moderate stool  -moved bowels 6/15 and 16 13. Urinary retention: Has failed one voiding trial by Dr. .             --  plans for ultrasound of bladder/prostate? 06/24?             --Continue Flomax at bedtime.  -RN changed foley 6/17 14. Leucocytosis: Resolving without signs of infection             --encourage pulmonary toilet for likely Right atelectasis. 15. ABLA: Continue to monitor H/H with serial checks and monitor for signs of bleeding. --Monitor hematoma right hip./flank.             -6/15 hgb looks to be trending slowly up, 7.5 ---> 8.3  -Resume ferrous gluconate 324mg  MWF.  16. H/o COPD: Stable. Did not like trial of MDI per wife.  17. Vitamin B12 deficiency: Vitamin B12 daily ordered  LOS: 6 days A FACE TO FACE EVALUATION WAS PERFORMED  Wilkin Lippy 10/19/2020, 2:29 PM

## 2020-10-20 NOTE — Progress Notes (Signed)
Occupational Therapy TBI Note  Patient Details  Name: Nathan Mitchell MRN: [id number] Date of Birth: October 06, 1930  Today's Date: 10/20/2020 OT Individual Time: 1030-1130 OT Individual Time Calculation (min): 60 min    Short Term Goals: Week 1:  OT Short Term Goal 1 (Week 1): Pt will stand pivot transfer to BSC/toilet wiht MOD A consistently OT Short Term Goal 2 (Week 1): Pt will thread 1LE into pants OT Short Term Goal 3 (Week 1): Pt will groom wiht S  Skilled Therapeutic Interventions/Progress Updates:    Pt received sitting in the w/c with 6/10 pain in his R hip, requesting shower as pain intervention. Discussed d/c and conference discussion. He completed sit > stand from the w/c with min A using RW. Pt completed ambulatory transfer into the bathroom with min A. He reports increased pain with RLE weightbearing. Discussed MD's suggestion to take oxy PRN when needed- pt agrees. Pt completed toileting tasks with min A overall, voiding BM. Pt SOB following transfer but regulated RR with rest. Pt required min A for peri hygiene in standing. Pt transferred into shower with min A. He required mod A for LB bathing- feet and bottom in standing. Pt required mod A to thread shorts over feet- will trial introducing AE next session. He donned shirt with set up assist. He returned to his w/c via min A ambulatory transfer with the RW. Again pt SOB- vitals assessed (pt asymptomatic), and very irregular pleth and dynamap having difficulty reading pt. Eventually it read- Spo2 95% and HR 100 bpm. Pt reporting hip pain down to 5/10 and requesting to rest in supine for remainder of morning for pain relief. He transferred with min A and was left supine with all needs met, bed alarm set.   Therapy Documentation Precautions:  Precautions Precautions: Fall Precaution Comments: foley, T4 compression fx (no brace) Restrictions Weight Bearing Restrictions: No RLE Weight Bearing: Weight bearing as tolerated LLE  Weight Bearing: Weight bearing as tolerated  Agitated Behavior Scale: TBI Observation Details Observation Environment: CIR Start of observation period - Date: 10/20/20 Start of observation period - Time: 1030 End of observation period - Date: 10/20/20 End of observation period - Time: 1130 Agitated Behavior Scale (DO NOT LEAVE BLANKS) Short attention span, easy distractibility, inability to concentrate: Absent Impulsive, impatient, low tolerance for pain or frustration: Absent Uncooperative, resistant to care, demanding: Absent Violent and/or threatening violence toward people or property: Absent Explosive and/or unpredictable anger: Absent Rocking, rubbing, moaning, or other self-stimulating behavior: Absent Pulling at tubes, restraints, etc.: Absent Wandering from treatment areas: Absent Restlessness, pacing, excessive movement: Absent Repetitive behaviors, motor, and/or verbal: Absent Rapid, loud, or excessive talking: Absent Sudden changes of mood: Absent Easily initiated or excessive crying and/or laughter: Absent Self-abusiveness, physical and/or verbal: Absent Agitated behavior scale total score: 14    Therapy/Group: Individual Therapy  Curtis Sites 10/20/2020, 10:54 AM

## 2020-10-20 NOTE — Patient Care Conference (Signed)
Inpatient RehabilitationTeam Conference and Plan of Care Update Date: 10/20/2020   Time: 10:19 AM    Patient Name: Nathan Mitchell      Medical Record Number: [id number]  Date of Birth: 10-Mar-1931 Sex: Male         Room/Bed: 4W24C/4W24C-01 Payor Info: Payor: [employer] MEDICARE / Plan: [employer] MEDICARE CHOICE PPO / Product Type: *No Product type* /    Admit Date/Time:  10/13/2020  4:33 PM  Primary Diagnosis:  TBI (traumatic brain injury) [facility])  Hospital Problems: Principal Problem:   TBI (traumatic brain injury) [facility])    Expected Discharge Date: Expected Discharge Date: 11/04/20  Team Members Present: Physician leading conference: Dr. Sula Soda Care Coodinator Present: Cecile Sheerer, LCSWA;Tomia Enlow Marlyne Beards, RN, BSN, CRRN Nurse Present: Kennyth Arnold, RN PT Present: Serina Cowper, PT OT Present: Jake Shark, OT SLP Present: Other (comment) [name], SLP) PPS Coordinator present : Fae Pippin, SLP     Current Status/Progress Goal Weekly Team Focus  Bowel/Bladder   Patient has a foley cath. to straight drainage- urinary retention and is continent of BM,  Patient will be able to urinate without bladder retention, completely emptying without discomfort  QS/PRN assessment   Swallow/Nutrition/ Hydration             ADL's   mod A toileting tasks, mod A LB ADLs, supervision UB ADLs, min A ADL transfers  supervision overall  ADLs, ADL transfers, cognitive retraining/awareness, family edu   Mobility   Min A overall, gait >40 ft with RW, limited by R hip pain and activity tolerance (SOB)  Supervision overall  Pain management, ROM, activity tolerance, functional mobility, gait training, w/c mobility, recall, patient/caregiver education   Communication             Safety/Cognition/ Behavioral Observations  Min-to-Mod  Sup  awareness of deficits, short-term recall   Pain   Surgical right hip incisional pain rating 5-7/10 on pain scale, medicated with prn   pain < or = 3 on pain scale, pain is controlled  Assess QS/PRN with follow up documentations per protocol   Skin   Surgical right hip incision healing well, tenderness/soreness, no drainage, irritation, redness, scalp incision intact and healing  Patient will remain free of infection  Dressing x3 intact, BID dressing changes,     Discharge Planning:  D/c tohome with 24/7 care from his wife. Dtrs to assist as needed, and willing to hire support if needed.   Team Discussion: No pain reported to MD, VS stable, restart Methotrexate this Friday, restart home iron and B-12. Chronic foley, incontinent bowel, reports hip pain, has abrasion to head, sutures to left thigh. Discharging home with wife and daughters to assist. Patient on target to meet rehab goals: yes, showered yesterday, min assist transfers, mod assist ADL's, supervision goals. Min assist overall, pain is with moving. Was SOB, had irregular HR, would like to have nursing administer medications prior to therapy, including pain medication. SLP reports min/mod assist but depends on the task.  *See Care Plan and progress notes for long and short-term goals.   Revisions to Treatment Plan:  MD adjusting and restarting medications.  Teaching Needs: Family education, medication management, pain management, skin/wound care, transfer training, gait training, balance training, endurance training, safety awareness, weight bearing precautions.  Current Barriers to Discharge: Decreased caregiver support, Medical stability, Home enviroment access/layout, Incontinence, Wound care, Lack of/limited family support, Weight bearing restrictions, Medication compliance, and Behavior  Possible Resolutions to Barriers: Continue current medications, provide emotional support.  Medical Summary Current Status: Pain at hip fracture site, giant cell arteritis with visual loss, shortness of breath with irregular heart rhythm, anemia  Barriers to Discharge:  Medical stability;Wound care  Barriers to Discharge Comments: Pain at hip fracture site, giant cell arteritis with visual loss, shortness of breath with irregular heart rhythm, anemia Possible Resolutions to [employer] Focus: regular moitoring of fracture site, continue oxycodone prn, restart methotrexate on friday and recheck T bili on thursday, restarted home iron and B-12   Continued Need for Acute Rehabilitation Level of Care: The patient requires daily medical management by a physician with specialized training in physical medicine and rehabilitation for the following reasons: Direction of a multidisciplinary physical rehabilitation program to maximize functional independence : Yes Medical management of patient stability for increased activity during participation in an intensive rehabilitation regime.: Yes Analysis of laboratory values and/or radiology reports with any subsequent need for medication adjustment and/or medical intervention. : Yes   I attest that I was present, lead the team conference, and concur with the assessment and plan of the team.   Tennis Must 10/20/2020, 4:00 PM

## 2020-10-20 NOTE — Progress Notes (Signed)
Speech Language Pathology Daily Session Note  Patient Details  Name: Nathan Mitchell MRN: [id number] Date of Birth: [birth year]  Today's Date: 10/20/2020 SLP Individual Time: 8588-5027 SLP Individual Time Calculation (min): 30 min Missed Time: 15 minutes due to nursing needs.   Short Term Goals: Week 1: SLP Short Term Goal 1 (Week 1): Patient will recall daily, functional information with Min verbal cues for use of strategies. SLP Short Term Goal 2 (Week 1): Patient will identify 1 cognitive deficit since admission with Min verbal cues.  Skilled Therapeutic Interventions: Skilled SLP services performed with focus on cognitive goals. SLP facilitated functional working memory and short-term recall task with min A verbal cues. Patient was able to recall 6 of 8 new items progressing to 8/8 with min A semantic cue after short-term delay of 7 minutes. Patient attended well to mildly distracting environment (background noise from housekeeping, noise in the hall). Patient was unable to report what he has been working on in therapy and denied that he needs to work on anything despite current deficits and acknowledgement of hip injury. Stated "I can function fine at home." Denied any cognitive changes. Patient was left in bed with alarm activated and needs within reach. Continue with current plan of care.    Pain Pain Assessment Pain Scale: 0-10 Pain Score: 0-No pain  Therapy/Group: Individual Therapy  Tamala Ser 10/20/2020, 8:45 AM

## 2020-10-20 NOTE — Progress Notes (Signed)
Patient ID: Nathan Mitchell, male   DOB: [birth year], 87 y.o.   MRN: [id number]  SW met with pt and pt wife to provide updates from team conference, and d/c date 7/6. SW to continue to follow-up after team conference.   Loralee Pacas, MSW, [place] Office: [phone] Cell: [phone] Fax: [phone]

## 2020-10-20 NOTE — Progress Notes (Signed)
bedPhysical Therapy TBI Note  Patient Details  Name: Nathan Mitchell MRN: [id number] Date of Birth: [birth year]  Today's Date: 10/20/2020 PT Individual Time: 0900-1010 and 1430-1500 PT Individual Time Calculation (min): 70 min and 30 min   Short Term Goals: Week 1:  PT Short Term Goal 1 (Week 1): Patient will perform bed mobility with min A consistently. PT Short Term Goal 2 (Week 1): Patient will perform basic transfers with min A consistently. PT Short Term Goal 3 (Week 1): Patient will ambulate >25 ft using LRAD. PT Short Term Goal 4 (Week 1): Patient will propel w/c >100 ft  Skilled Therapeutic Interventions/Progress Updates:     Session 1: Patient in bed upon PT arrival. Patient alert and agreeable to PT session. Patient reported 3/10 R hip pain at rest and 7/10 with mobility during session, RN made aware and provided medication during session. PT provided repositioning, rest breaks, and distraction as pain interventions throughout session.   Applied Kinesiotape over R lateral and medial thigh using lymphatic drainage technique with the aim of edema and ecchymosis management. Prior to application, patient denied any history of skin irritation or allergy to adhesive. Educated patient on purpose of kinesiotape placement and signs symptoms of allergic reaction or irritation. Cleaned patient's skin and shaved leg hair reduce painful removal. Instructed patient that the tape can be left on up to 3 days and can be worn in the shower. Instructed to removed the tape if peeling off, signs of skin irritation arise, or it has been on for >3 days. Informed patient that the tape is best removed in the shower or with a wet wash cloth. Patient stated understanding. Rehab team informed about tape placement to assist with monitoring patient's response.  Therapeutic Activity: Bed Mobility: Patient performed supine to sit with min A for R hip and trunk management to the R side of the bed in a flat,  elevated bed without use of bed rails. Provided verbal cues for sequencing and log roll technique to the R with poor tolerance on R hip. Transfers: Patient performed sit to/from stand x1 from elevated bed height to simulate home set-up, x1 from w/c, and stand pivot x1 from recliner with min A for steadying due to posterior bias. Provided verbal cues forhand placement x1 then min cues for recall x2, forward weight shift with facilitation, and sliding R foot out when sitting to reduce hip flexion angle and pain.  Gait Training:  Patient ambulated 18 feet and 40 feet using RW with CGA and min cues for navigation due to visual deficits. Ambulated with antalgic gait on R, trunk flexion, B shoulder elevation, and step-to gait pattern leading with R. Provided verbal cues for shoulder depression, erect posture, and stepping into the RW for improved upper extremity leverage to off-weight his R leg in stance due to elevated pain levels.  Therapeutic Exercise: Patient performed the following exercises with verbal and tactile cues for proper technique. -ankle circles x10 -R heel slides 2x10 with min A AAROM -R hip abduction/adduction x10  Patient in w/c in the room at end of session with breaks locked, seat belt alarm set, and all needs within reach. Patient attempted to sit in the recliner at end of session, however was unable to get comfortable with his R hip despite increased time for improved positioning. Patient reported increased comfort sitting in w/c with R ELR.   Session 2: Patient in bed upon PT arrival. Patient alert and agreeable to PT session. Patient reported 4-5/10  R hip pain during session, RN made aware. PT provided repositioning, rest breaks, and distraction as pain interventions throughout session.   Therapeutic Activity: Bed Mobility: Patient performed supine to/from sit with min A with home set-up as above, provided cues for brining knees to chest to lift lower extremities onto the bed.   Transfers: Patient performed sit to/from stand x1 and stand pivot x1 with CGA using RW with reduced posterior bias this afternoon. Provided min verbal cues for recall of hand position and locating arm rests on w/c to control descent.  Gait Training:  Patient ambulated 62 feet using RW with CGA and w/c follow for safety due to decreased activity tolerance. Ambulated as above with improved shoulder depression, erect posture and progressed to reciprocal gait pattern with decreased step length on L. Provided verbal cues for looking ahead, locating larger and brighter colored shapes to navigate more independently, increased equal step length as tolerated.  Wheelchair Mobility:  Patient propelled wheelchair 55 feet with supervision. Provided mod verbal cues and hand over hand assist to initiate for turning technique and navigation.   Patient in bed at end of session with breaks locked, bed alarm set, and all needs within reach.   Therapy Documentation Precautions:  Precautions Precautions: Fall Precaution Comments: foley, T4 compression fx (no brace) Restrictions Weight Bearing Restrictions: No RLE Weight Bearing: Weight bearing as tolerated LLE Weight Bearing: Weight bearing as tolerated  Agitated Behavior Scale: TBI Observation Details Observation Environment: 4 [address] Start of observation period - Date: 10/20/20 Start of observation period - Time: 0900 End of observation period - Date: 10/20/20 End of observation period - Time: 1010 Agitated Behavior Scale (DO NOT LEAVE BLANKS) Short attention span, easy distractibility, inability to concentrate: Absent Impulsive, impatient, low tolerance for pain or frustration: Absent Uncooperative, resistant to care, demanding: Absent Violent and/or threatening violence toward people or property: Absent Explosive and/or unpredictable anger: Absent Rocking, rubbing, moaning, or other self-stimulating behavior: Absent Pulling at tubes, restraints,  etc.: Absent Wandering from treatment areas: Absent Restlessness, pacing, excessive movement: Absent Repetitive behaviors, motor, and/or verbal: Absent Rapid, loud, or excessive talking: Absent Sudden changes of mood: Absent Easily initiated or excessive crying and/or laughter: Absent Self-abusiveness, physical and/or verbal: Absent Agitated behavior scale total score: 14 Agitated Behavior Scale: TBI Observation Details Observation Environment: 4 [address] Start of observation period - Date: 10/20/20 Start of observation period - Time: 1430 End of observation period - Date: 10/20/20 End of observation period - Time: 1500 Agitated Behavior Scale (DO NOT LEAVE BLANKS) Short attention span, easy distractibility, inability to concentrate: Absent Impulsive, impatient, low tolerance for pain or frustration: Absent Uncooperative, resistant to care, demanding: Absent Violent and/or threatening violence toward people or property: Absent Explosive and/or unpredictable anger: Absent Rocking, rubbing, moaning, or other self-stimulating behavior: Absent Pulling at tubes, restraints, etc.: Absent Wandering from treatment areas: Absent Restlessness, pacing, excessive movement: Absent Repetitive behaviors, motor, and/or verbal: Absent Rapid, loud, or excessive talking: Absent Sudden changes of mood: Absent Easily initiated or excessive crying and/or laughter: Absent Self-abusiveness, physical and/or verbal: Absent Agitated behavior scale total score: 14     Therapy/Group: Individual Therapy  Layaan Mott L Jewelia Bocchino PT, DPT  10/20/2020, 3:55 PM

## 2020-10-20 NOTE — Progress Notes (Signed)
PROGRESS NOTE   Subjective/Complaints: Denies pain at rest, but told by therapy that he has pain with movement. Reviewed medications, he is not requesting oxycodone often- can consider before therapy sessions to maximize participation, discussed at team meeting Discussed with patient that Hgb is improving.   ROS: Patient denies CP, SOB, N/V/D, +hip pain with movement   Objective:   No results found. Recent Labs    10/19/20 0624  WBC 8.5  HGB 8.3*  HCT 27.3*  PLT 359   Recent Labs    10/19/20 0624  NA 139  K 3.8  CL 104  CO2 27  GLUCOSE 100*  BUN 14  CREATININE 1.03  CALCIUM 8.7*    Intake/Output Summary (Last 24 hours) at 10/20/2020 1024 Last data filed at 10/20/2020 0900 Gross per 24 hour  Intake 837 ml  Output 1725 ml  Net -888 ml        Physical Exam: Vital Signs Blood pressure 129/83, pulse 78, temperature 97.6 F (36.4 C), temperature source Oral, resp. rate 18, height 6\' 1"  (1.854 m), weight 82.4 kg, SpO2 96 %. Gen: no distress, normal appearing HEENT: oral mucosa pink and moist, NCAT Cardio: Reg rate Chest: normal effort, normal rate of breathing Abd: soft, non-distended Ext: no edema Psych: pleasant, normal affect Skin: intact  Neuro: Pt is cognitively appropriate with normal insight, memory, and awareness. Cranial nerves 2-12 are intact. Sensory exam is normal. Reflexes are 2+ in all 4's. Fine motor coordination is intact. No tremors. Motor function is grossly 5/5 in UE's. RLE 2/5 d/t pain prox to 4/5 distally. LLE 3-5/5 prox to distal.--stable  Musculoskeletal:  right hip tender with rom, edema better  Assessment/Plan: 1. Functional deficits which require 3+ hours per day of interdisciplinary therapy in a comprehensive inpatient rehab setting. Physiatrist is providing close team supervision and 24 hour management of active medical problems listed below. Physiatrist and rehab team continue to  assess barriers to discharge/monitor patient progress toward functional and medical goals  Care Tool:  Bathing    Body parts bathed by patient: Right arm, Left arm, Chest, Abdomen, Front perineal area, Right upper leg, Left upper leg, Face   Body parts bathed by helper: Buttocks, Left lower leg, Right lower leg     Bathing assist Assist Level: Maximal Assistance - Patient 24 - 49% (STS)     Upper Body Dressing/Undressing Upper body dressing   What is the patient wearing?: Pull over shirt    Upper body assist Assist Level: Contact Guard/Touching assist    Lower Body Dressing/Undressing Lower body dressing      What is the patient wearing?: Underwear/pull up, Pants, Incontinence brief     Lower body assist Assist for lower body dressing: Dependent - Patient 0%     Toileting Toileting    Toileting assist Assist for toileting: Dependent - Patient 0%     Transfers Chair/bed transfer  Transfers assist     Chair/bed transfer assist level: Minimal Assistance - Patient > 75% Chair/bed transfer assistive device:   Ambulation assist      Assist level: Contact Guard/Touching assist Assistive device: Walker-rolling Max distance: 36 ft  Walk 10 feet activity   Assist  Walk 10 feet activity did not occur: Safety/medical concerns (Limited by increased R hip pain)  Assist level: Contact Guard/Touching assist Assistive device: Walker-rolling   Walk 50 feet activity   Assist Walk 50 feet with 2 turns activity did not occur: Safety/medical concerns         Walk 150 feet activity   Assist Walk 150 feet activity did not occur: Safety/medical concerns         Walk 10 feet on uneven surface  activity   Assist Walk 10 feet on uneven surfaces activity did not occur: Safety/medical concerns         Wheelchair     Assist Will patient use wheelchair at discharge?: Yes Type of Wheelchair: Manual Wheelchair activity  did not occur: Safety/medical concerns (Limited by increased R hip pain during evaluation)  Wheelchair assist level: Contact Guard/Touching assist Max wheelchair distance: 150 ft    Wheelchair 50 feet with 2 turns activity    Assist    Wheelchair 50 feet with 2 turns activity did not occur: Safety/medical concerns   Assist Level: Contact Guard/Touching assist   Wheelchair 150 feet activity     Assist  Wheelchair 150 feet activity did not occur: Safety/medical concerns   Assist Level: Contact Guard/Touching assist   Blood pressure 129/83, pulse 78, temperature 97.6 F (36.4 C), temperature source Oral, resp. rate 18, height 6\' 1"  (1.854 m), weight 82.4 kg, SpO2 96 %.        Medical Problem List and Plan: 1.  TBI with associated SDH. Also suffered right IT hip fracture, T4 compression fx             -patient may not shower             -ELOS/Goals: MinA 2-3 weeks             -Continue CIR therapies including PT, OT, and SLP  -Interdisciplinary Team Conference today    2.  Impaired mobility -DVT/anticoagulation:  Pharmaceutical: Continue Lovenox bid, with check for ortho how long he will need this so we can provide injection education if warranted.              -antiplatelet therapy: N/A 3. Right IT hip fracture: Schedule tylenol 650mg  TID. LFTs reviewed and wnl. Continue oxycodone/robaxin prn, discussed with team that he is not using much, can consider using prior to therapy to maximize participation.  4. Mood: LCSW to follow for evaluation and support.             -antipsychotic agents: N/A 5. Neuropsych: This patient is not fully capable of making decisions on his own behalf.             --cognition improving.   -continue to monitor sleep chart  -avoid neurosedating meds 6. Skin/Wound Care: foam dressing to right hip incision-minimal to no drainage 7. Fluids/Electrolytes/Nutrition: Appetite has been good per wife. -- encourage PO 8. Right femur fracture: WBAT.  Prevena VAC out             --wounds healing nicely  -remove staples from scalp today 9. 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 off since 6/16  no seizures            10 Giant cell arteritis with visual loss: Was on methotrexate weekly. --Spoke to Dr. 6/16---we will resume MTX 6/24. Recheck Tbili on 6/23 prior to resuming. Ordered. Folic acid changed back to 1mg  dose.  11. RCV:ELFYBOF HR tid--incidental finding years ago and has been controlled.             --To follow up with Card? PCP regarding d/c of Xarelto per notes. 12. Constipation: Continue miralax.  13. Urinary retention: Has failed one voiding trial by Dr. Cardell Peach.             --plans for ultrasound of bladder/prostate? 06/24?             --Continue Flomax at bedtime.  -RN changed foley 6/17 14. Leucocytosis: Resolving without signs of infection             --encourage pulmonary toilet for likely Right atelectasis. 15. ABLA: Continue to monitor H/H with serial checks and monitor for signs of bleeding. --Monitor hematoma right hip./flank.             -6/15 hgb looks to be trending slowly up, 7.5 ---> 8.3  -Resume ferrous gluconate 324mg  MWF.  16. H/o COPD: Stable. Did not like trial of MDI per wife.  17. Vitamin B12 deficiency: Vitamin B12 daily ordered  LOS: 7 days A FACE TO FACE EVALUATION WAS PERFORMED  P Orest Dygert 10/20/2020, 10:24 AM

## 2020-10-21 MED ORDER — MELATONIN 3 MG PO TABS
3.0000 mg | ORAL_TABLET | Freq: Every day | ORAL | Status: DC
  Administered 2020-10-21 – 2020-11-03 (×14): 3 mg via ORAL
  Filled 2020-10-21 (×13): qty 1

## 2020-10-21 NOTE — Progress Notes (Signed)
Notified by patient that he had awaken from his sleep and was having difficulty breathing, patient was assessed VS 98.1, 153/71 ,p  87, R-20 Denies coloration adequate, CP, Nausea/Vomiting appears anxious stating this has never happened to him before, Monitor and assisted , repositioned, call bell within reach.HOB elevated

## 2020-10-21 NOTE — Progress Notes (Signed)
Speech Language Pathology Weekly Progress and Session Note  Patient Details  Name: Nathan Mitchell MRN: [id number] Date of Birth: [birth year]  Beginning of progress report period: October 14, 2020 End of progress report period: October 21, 2020  Today's Date: 10/21/2020 SLP Individual Time: 6295-2841 SLP Individual Time Calculation (min): 45 min  Short Term Goals: Week 1: SLP Short Term Goal 1 (Week 1): Patient will recall daily, functional information with Min verbal cues for use of strategies. SLP Short Term Goal 1 - Progress (Week 1): Met SLP Short Term Goal 2 (Week 1): Patient will identify 1 cognitive deficit since admission with Min verbal cues. SLP Short Term Goal 2 - Progress (Week 1): Met    New Short Term Goals: Week 1: SLP Short Term Goal 1 (Week 1): Patient will recall daily, functional information with Min verbal cues for use of strategies. SLP Short Term Goal 1 - Progress (Week 1): Met SLP Short Term Goal 2 (Week 1): Patient will identify 1 cognitive deficit since admission with Min verbal cues. SLP Short Term Goal 2 - Progress (Week 1): Met  Weekly Progress Updates: Patient has made consistent progress toward goals and has met 2 of 2 short term goals. He demonstrates recall of functional and new information with Min A verbal cues. He is able to attend to tasks within mildly distractible environments with minimal verbal re-direction. He required Mod A verbal cues to initiate and sequence oral care. Displays inconsistent insight to deficits, although improvement has been noted. He continues to exhibit deficits in short-term memory, sequencing/planning, and awareness. Would still benefit to speak with spouse regarding PLOF, as unsure of patient's baseline from a cognitive standpoint. Patient will continue to benefit from skilled ST intervention to enhance overall cognitive function and independence.  Patient progressing to long term goals. Continue with POC.   Intensity:  Minumum of 1-2 x/day, 30 to 90 minutes Frequency: 3 to 5 out of 7 days Duration/Length of Stay: 7/6 Treatment/Interventions: Cognitive remediation/compensation;Environmental controls;Cueing hierarchy;Functional tasks;Patient/family education;Therapeutic Activities   Daily Session  Skilled Therapeutic Interventions: Skilled ST treatment performed with focus on cognitive goals. Assisted patient to sink for oral care where he required initial set-up to orient to environment due to visual deficits and Mod A verbal cues for initiation and sequencing task (required verbal cue to rinse toothbrush, turn off water). Patient stated he completed oral care independently at prior level and required no assistance from spouse. When SLP identified that patient required support to complete this task today, he stated "I've never had any issues at home" with limited insight into current challenges. Of course it is important to take into account the differences between home set-up vs. Rehab in terms of orientation for vision. Patient identified he can be forgetful and doesn't always recall what is wrong in the moment until someone points it out to him. He endorses forgetfulness at baseline. SLP facilitated functional short-term recall task involving a daily schedule of events involving 5 timed activities. Patient was able to successfully recall 5/5 items after short-term (6 minute) delay with Min verbal cues for accuracy. He was able to recall details of planned events, but unable to consistently recall specific times. Patient also demonstrated selective attention for 12 minute duration during verbal reasoning and recall task with min A verbal cues for accuracy. Patient was left in wheelchair with alarm activated and needs within reach. Continue per ST POC.    General    Pain Pain Assessment Pain Scale: 0-10 Pain Score:  4  Pain Location: Hip Pain Orientation: Right Pain Descriptors / Indicators: Aching Pain Onset:  On-going Pain Intervention(s): Repositioned  Therapy/Group: Individual Therapy  Patty Sermons 10/21/2020, 12:24 PM

## 2020-10-21 NOTE — Progress Notes (Signed)
Occupational Therapy TBI Note  Patient Details  Name: MERWIN BREDEN MRN: [id number] Date of Birth: 1930/09/16  Today's Date: 10/21/2020 OT Individual Time: 1130-1156 OT Individual Time Calculation (min): 26 min    Short Term Goals: Week 1:  OT Short Term Goal 1 (Week 1): Pt will stand pivot transfer to BSC/toilet wiht MOD A consistently OT Short Term Goal 1 - Progress (Week 1): Met OT Short Term Goal 2 (Week 1): Pt will thread 1LE into pants OT Short Term Goal 2 - Progress (Week 1): Progressing toward goal OT Short Term Goal 3 (Week 1): Pt will groom wiht S OT Short Term Goal 3 - Progress (Week 1): Met  Skilled Therapeutic Interventions/Progress Updates:    1:1. Pt received in bed with wife arriving. Pt completes sup>sit with supervision and VC for looking to orange coban on bed rail to use to push up to sitting. MIN A SPT to w/c with VC for foot placement prior to transfer. Pt completes ambulatory transfer to/from w/c/mat table. Standing bean bag toss with CGA overall and 1 lateral L LOB with MIN A to recover. Functional mobility in room with CGA to recliner in room. Exited session with pt seated in recliner, exit alarm on and call light in reach   Therapy Documentation Precautions:  Precautions Precautions: Fall Precaution Comments: foley, T4 compression fx (no brace) Restrictions Weight Bearing Restrictions: No RLE Weight Bearing: Weight bearing as tolerated LLE Weight Bearing: Weight bearing as tolerated General:   Vital Signs:   Pain: Pain Assessment Pain Scale: 0-10 Pain Score: 4  Pain Location: Hip Pain Orientation: Right Pain Descriptors / Indicators: Aching Pain Onset: On-going Pain Intervention(s): Repositioned Agitated Behavior Scale: TBI  Observation Details Observation Environment: CIR Start of observation period - Date: 10/21/20 Start of observation period - Time: 1130 End of observation period - Date: 10/21/20 End of observation period - Time:  1208 Agitated Behavior Scale (DO NOT LEAVE BLANKS) Short attention span, easy distractibility, inability to concentrate: Absent Impulsive, impatient, low tolerance for pain or frustration: Absent Uncooperative, resistant to care, demanding: Absent Violent and/or threatening violence toward people or property: Absent Explosive and/or unpredictable anger: Absent Rocking, rubbing, moaning, or other self-stimulating behavior: Absent Pulling at tubes, restraints, etc.: Absent Wandering from treatment areas: Absent Restlessness, pacing, excessive movement: Absent Repetitive behaviors, motor, and/or verbal: Absent Rapid, loud, or excessive talking: Absent Sudden changes of mood: Absent Easily initiated or excessive crying and/or laughter: Absent Self-abusiveness, physical and/or verbal: Absent Agitated behavior scale total score: 14    Therapy/Group: indiviDUAL [place]  [employer] 10/21/2020, 12:09 PM

## 2020-10-21 NOTE — Progress Notes (Signed)
Occupational Therapy Weekly Progress Note  Patient Details  Name: Nathan Mitchell MRN: [id number] Date of Birth: Aug 12, 1930  Beginning of progress report period: October 14, 2020 End of progress report period: October 21, 2020  Today's Date: 10/21/2020 OT Individual Time: 0569-7948 OT Individual Time Calculation (min): 60 min    Patient has met 2 of 3 short term goals. Pt has made excellent progress this reporting period. He has progressed to completing ADL transfers with min A and has improved with his RW management and sequencing. He is able to perform UB ADLs with supervision and mod A for LB.   Patient continues to demonstrate the following deficits: muscle weakness, decreased cardiorespiratoy endurance, decreased initiation, decreased attention, decreased awareness, decreased problem solving, decreased safety awareness, decreased memory, and delayed processing, and decreased sitting balance, decreased standing balance, and decreased balance strategies and therefore will continue to benefit from skilled OT intervention to enhance overall performance with BADL and Reduce care partner burden.  Patient progressing toward long term goals..  Continue plan of care.  OT Short Term Goals Week 1:  OT Short Term Goal 1 (Week 1): Pt will stand pivot transfer to BSC/toilet wiht MOD A consistently OT Short Term Goal 1 - Progress (Week 1): Met OT Short Term Goal 2 (Week 1): Pt will thread 1LE into pants OT Short Term Goal 2 - Progress (Week 1): Progressing toward goal OT Short Term Goal 3 (Week 1): Pt will groom wiht S OT Short Term Goal 3 - Progress (Week 1): Met Week 2:  OT Short Term Goal 1 (Week 2): Pt will use LRAD to don pants with min A OT Short Term Goal 2 (Week 2): Pt will complete walk in shower transfers with min A and min cueing OT Short Term Goal 3 (Week 2): Pt will require no more than min cueing during shower level bathing for initiation and thoroughness  Skilled Therapeutic  Interventions/Progress Updates:    Pt received sitting up in bed with no c/o pain, eating breakfast. Pt requesting assist to improve upright posture in bed. [facility] was lowered to allow pt to scoot up in bed- mod cueing required for technique and min A. HOB elevated to 64 degrees and pt found this much more comfortable. Pt required intermittent assist to organize food tray items to bring them into his R peripheral field (where he has the most vision intact). Pt transferred to EOB with min A for RLE management. He completed sit > stand with min A using the RW, again requiring cueing for hand placement. Pt completed ambulatory transfer into the bathroom with min A using the RW. Min A for clothing management for toileting tasks. Pt voided loose Bm. Pt able to complete peri care in standing with CGA for standing balance. Pt returned to his w/c with RW, min A. Pt completed UB bathing and dressing at the sink with set up assist. Pt was agreeable to stay in the w/c with the chair alarm belt set. All needs within reach.    Pt continues to have SOB with an irregular HR. He often jumps from low 40's to 120 bpm. Dynamap has difficulty picking up regular pleth and giving accurate Spo2 readings- pt asymptomatic and endorses NO symptoms throughout session.   Therapy Documentation Precautions:  Precautions Precautions: Fall Precaution Comments: foley, T4 compression fx (no brace) Restrictions Weight Bearing Restrictions: No RLE Weight Bearing: Weight bearing as tolerated LLE Weight Bearing: Weight bearing as tolerated  Therapy/Group: Individual Therapy  Katharine Look  Dorcas Carrow 10/21/2020, 6:18 AM

## 2020-10-21 NOTE — Progress Notes (Signed)
Physical Therapy Weekly Progress Note  Patient Details  Name: Nathan Mitchell MRN: [id number] Date of Birth: 09/10/1930  Beginning of progress report period: October 14, 2020 End of progress report period: October 21, 2020  Today's Date: 10/21/2020 PT Individual Time: 1000-1055 PT Individual Time Calculation (min): 55 min   Patient has met 4 of 4 short term goals.  Patient making steady progress, somewhat limited by R hip pain with weight bearing. Patient currently performing bed mobility with min A for R lower extremity, transfers with min A-CGA with intermittent posterior lean using RW, ambulating up to 62 ft using RW with CGA, and able to propel the w/c up to 150 ft with supervision with cues for navigation due to visual deficits.  Patient continues to demonstrate the following deficits muscle weakness and muscle joint tightness, decreased cardiorespiratoy endurance, decreased visual acuity, decreased memory, and decreased sitting balance, decreased standing balance, decreased postural control, and decreased balance strategies and therefore will continue to benefit from skilled PT intervention to increase functional independence with mobility.  Patient progressing toward long term goals.  Continue plan of care.  PT Short Term Goals Week 1:  PT Short Term Goal 1 (Week 1): Patient will perform bed mobility with min A consistently. PT Short Term Goal 1 - Progress (Week 1): Met PT Short Term Goal 2 (Week 1): Patient will perform basic transfers with min A consistently. PT Short Term Goal 2 - Progress (Week 1): Met PT Short Term Goal 3 (Week 1): Patient will ambulate >25 ft using LRAD. PT Short Term Goal 3 - Progress (Week 1): Met PT Short Term Goal 4 (Week 1): Patient will propel w/c >100 ft PT Short Term Goal 4 - Progress (Week 1): Met Week 2:  PT Short Term Goal 1 (Week 2): Patient will perform basic transfers with CGA consistently using LRAD. PT Short Term Goal 2 (Week 2): Patient will  ambulate >75 ft using LRAD with CGA and reciprocal gait pattern. PT Short Term Goal 3 (Week 2): patient will perform standing balance during a functional task x5 min with CGA using LRAD.  Skilled Therapeutic Interventions/Progress Updates:     Patient in w/c upon PT arrival. Patient alert and agreeable to PT session. Patient reported 6/10 R hip pain during session, RN provided pain medicine during session. PT provided repositioning, rest breaks, and distraction as pain interventions throughout session.   Vitals: Patient with increased fatigue and SOB with mobility during session, HR irregular on palpation following gait and transfers x1-2 min, SPO2 >93% throughout session. RN made aware. Provided ample rest breaks with cues for diaphragmatic breathing to assist in regulating his HR by calming the nervous system.    Therapeutic Activity: Bed Mobility: Patient performed sit to supine with min A for R lower extremity. Patient able to teach back cues for technique this session. Transfers: Patient performed sit to/from stand x1 and stand pivot w/c<>NuStep with CGA with min cues for recall of hand placement  Gait Training:  Patient ambulated 10 feet using RW with CGA, limited by R hip pain. Ambulated with antalgic gait on R using step-to gait pattern. Provided verbal cues for erect posture and proximity to RW.  Wheelchair Mobility:  Patient propelled wheelchair >150 feet x2 with supervision with intermittent veering R. Provided verbal cues for navigation due to visual deficits, turning technique, and use of breaks.  Therapeutic Exercise: Patient performed the following exercises with verbal and tactile cues for proper technique. -NuStep x10 min focused on knee  and hip ROM with seat progressed forward at 5 min, 120 total steps and work load level 1, provided cues for increased ROM to tolerance and controlled movement for improved strengthening  Assessed Kinesiotape that was placed on R thigh  yesterday. Skin intact without signs of irritation, noted mild reduction of edema and ecchymosis. Tape beginning to roll at distal ends of both applications. Trimmed tape at rolled ends to continue with lymphatic drainage without loss of remaining tape.   Patient in bed at end of session with breaks locked, bed alarm set, and all needs within reach.   Therapy Documentation Precautions:  Precautions Precautions: Fall Precaution Comments: foley, T4 compression fx (no brace) Restrictions Weight Bearing Restrictions: No RLE Weight Bearing: Weight bearing as tolerated LLE Weight Bearing: Weight bearing as tolerated Agitated Behavior Scale: TBI  Observation Details Observation Environment: CIR Start of observation period - Date: 10/21/20 Start of observation period - Time: 1000 End of observation period - Date: 10/21/20 End of observation period - Time: 1055 Agitated Behavior Scale (DO NOT LEAVE BLANKS) Short attention span, easy distractibility, inability to concentrate: Absent Impulsive, impatient, low tolerance for pain or frustration: Absent Uncooperative, resistant to care, demanding: Absent Violent and/or threatening violence toward people or property: Absent Explosive and/or unpredictable anger: Absent Rocking, rubbing, moaning, or other self-stimulating behavior: Absent Pulling at tubes, restraints, etc.: Absent Wandering from treatment areas: Absent Restlessness, pacing, excessive movement: Absent Repetitive behaviors, motor, and/or verbal: Absent Rapid, loud, or excessive talking: Absent Sudden changes of mood: Absent Easily initiated or excessive crying and/or laughter: Absent Self-abusiveness, physical and/or verbal: Absent Agitated behavior scale total score: 14   Therapy/Group: Individual Therapy  Minda Faas L Brindy Higginbotham PT, DPT  10/21/2020, 12:42 PM

## 2020-10-22 LAB — COMPREHENSIVE METABOLIC PANEL
ALT: 20 U/L (ref 0–44)
AST: 15 U/L (ref 15–41)
Albumin: 2.6 g/dL — ABNORMAL LOW (ref 3.5–5.0)
Alkaline Phosphatase: 95 U/L (ref 38–126)
Anion gap: 4 — ABNORMAL LOW (ref 5–15)
BUN: 16 mg/dL (ref 8–23)
CO2: 28 mmol/L (ref 22–32)
Calcium: 8.4 mg/dL — ABNORMAL LOW (ref 8.9–10.3)
Chloride: 107 mmol/L (ref 98–111)
Creatinine, Ser: 1.11 mg/dL (ref 0.61–1.24)
GFR, Estimated: >60 mL/min (ref >60)
Glucose, Bld: 111 mg/dL — ABNORMAL HIGH (ref 70–99)
Potassium: 4 mmol/L (ref 3.5–5.1)
Sodium: 139 mmol/L (ref 135–145)
Total Bilirubin: 0.7 mg/dL (ref 0.3–1.2)
Total Protein: 5.6 g/dL — ABNORMAL LOW (ref 6.5–8.1)

## 2020-10-22 NOTE — Progress Notes (Signed)
Methotrexate (Trexall; Rheumatrex) hold criteria Hgb < 8: Hgb 8.3 WBC < 3: WBC 8.5 Pltc < 100K: Plts 359 SCr > 1.5x baseline (or > 2 if baseline unknown): Scr 1.11 AST or ALT >3x ULN: Watch ALT 62>>20 down Bili > 1.5x ULN: Tbili 2.2 (exactly 1.5 ULN)>>0.7 now WNL Ascites or pleural effusion Diarrhea - Grade 2 or higher: no Ulcerative stomatitis: no Unexplained pneumonitis / hypoxemia: no Active infection: no   Plan:  Ok to give MTX on 6/24 due to lower [facility] S. Merilynn Finland, PharmD, BCPS Clinical Staff Pharmacist [email or web address]

## 2020-10-22 NOTE — Progress Notes (Signed)
PROGRESS NOTE   Subjective/Complaints: No complaints this morning Nathan Mitchell notes shortness of breath- patient does not feel short of breath but with labored breathing, satting well.  Ambulating in hallway CG RW  ROS: Patient denies CP, SOB, N/V/D, +hip pain with movement, +labored breathing during sessions as per therapy  Objective:   No results found. No results for input(s): WBC, HGB, HCT, PLT in the last 72 hours.  Recent Labs    10/22/20 0434  NA 139  K 4.0  CL 107  CO2 28  GLUCOSE 111*  BUN 16  CREATININE 1.11  CALCIUM 8.4*    Intake/Output Summary (Last 24 hours) at 10/22/2020 1335 Last data filed at 10/22/2020 0700 Gross per 24 hour  Intake 740 ml  Output 1626 ml  Net -886 ml        Physical Exam: Vital Signs Blood pressure (!) 105/53, pulse 95, temperature 97.6 F (36.4 C), temperature source Oral, resp. rate 20, height 6\' 1"  (1.854 m), weight 81.4 kg, SpO2 100 %. Gen: no distress, normal appearing HEENT: oral mucosa pink and moist, NCAT Cardio: Reg rate Chest: normal effort, normal rate of breathing Abd: soft, non-distended Ext: no edema Psych: pleasant, normal affect Skin: intact  Neuro: Pt is cognitively appropriate with normal insight, memory, and awareness. Cranial nerves 2-12 are intact. Sensory exam is normal. Reflexes are 2+ in all 4's. Fine motor coordination is intact. No tremors. Motor function is grossly 5/5 in UE's. RLE 2/5 d/t pain prox to 4/5 distally. LLE 3-5/5 prox to distal.--stable  Musculoskeletal:  right hip tender with rom, edema better  Assessment/Plan: 1. Functional deficits which require 3+ hours per day of interdisciplinary therapy in a comprehensive inpatient rehab setting. Physiatrist is providing close team supervision and 24 hour management of active medical problems listed below. Physiatrist and rehab team continue to assess barriers to discharge/monitor patient  progress toward functional and medical goals  Care Tool:  Bathing    Body parts bathed by patient: Right arm, Left arm, Chest, Abdomen, Front perineal area, Right upper leg, Left upper leg, Face, Left lower leg, Right lower leg   Body parts bathed by helper: Right lower leg, Left lower leg, Buttocks     Bathing assist Assist Level: Contact Guard/Touching assist     Upper Body Dressing/Undressing Upper body dressing   What is the patient wearing?: Pull over shirt    Upper body assist Assist Level: Supervision/Verbal cueing    Lower Body Dressing/Undressing Lower body dressing      What is the patient wearing?: Pants     Lower body assist Assist for lower body dressing: Minimal Assistance - Patient > 75%     Toileting Toileting    Toileting assist Assist for toileting: Minimal Assistance - Patient > 75%     Transfers Chair/bed transfer  Transfers assist     Chair/bed transfer assist level: Contact Guard/Touching assist Chair/bed transfer assistive device:   Ambulation assist      Assist level: Contact Guard/Touching assist Assistive device: Walker-rolling Max distance: 62 ft   Walk 10 feet activity   Assist  Walk 10 feet activity did not occur: Safety/medical concerns (  Limited by increased R hip pain)  Assist level: Contact Guard/Touching assist Assistive device: Walker-rolling   Walk 50 feet activity   Assist Walk 50 feet with 2 turns activity did not occur: Safety/medical concerns  Assist level: Contact Guard/Touching assist Assistive device: Walker-rolling    Walk 150 feet activity   Assist Walk 150 feet activity did not occur: Safety/medical concerns         Walk 10 feet on uneven surface  activity   Assist Walk 10 feet on uneven surfaces activity did not occur: Safety/medical concerns         Wheelchair     Assist Will patient use wheelchair at discharge?: Yes Type of Wheelchair:  Manual Wheelchair activity did not occur: Safety/medical concerns (Limited by increased R hip pain during evaluation)  Wheelchair assist level: Supervision/Verbal cueing Max wheelchair distance: 55 ft    Wheelchair 50 feet with 2 turns activity    Assist    Wheelchair 50 feet with 2 turns activity did not occur: Safety/medical concerns   Assist Level: Supervision/Verbal cueing   Wheelchair 150 feet activity     Assist  Wheelchair 150 feet activity did not occur: Safety/medical concerns   Assist Level: Contact Guard/Touching assist   Blood pressure (!) 105/53, pulse 95, temperature 97.6 F (36.4 C), temperature source Oral, resp. rate 20, height 6\' 1"  (1.854 m), weight 81.4 kg, SpO2 100 %.        Medical Problem List and Plan: 1.  TBI with associated SDH. Also suffered right IT hip fracture, T4 compression fx             -patient may not shower             -ELOS/Goals: MinA 2-3 weeks             -Continue CIR therapies including PT, OT, and SLP  -Interdisciplinary Team Conference today    2.  Impaired mobility -DVT/anticoagulation:  Pharmaceutical: Continue Lovenox bid, with check for ortho how long he will need this so we can provide injection education if warranted.              -antiplatelet therapy: N/A 3. Right IT hip fracture: Schedule tylenol 650mg  TID. LFTs reviewed and wnl. Continue oxycodone/robaxin prn, discussed with team that he is not using much, can consider using prior to therapy to maximize participation.  4. Mood: LCSW to follow for evaluation and support.             -antipsychotic agents: N/A 5. Neuropsych: This patient is not fully capable of making decisions on his own behalf.             --cognition improving.   -continue to monitor sleep chart  -avoid neurosedating meds 6. Skin/Wound Care: foam dressing to right hip incision-minimal to no drainage 7. Fluids/Electrolytes/Nutrition: Appetite has been good per wife. -- encourage PO 8. Right  femur fracture: WBAT. Prevena VAC out             --wounds healing nicely  -remove staples from scalp today 9. 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 off since 6/16  no seizures            10 Giant cell arteritis with visual loss: Was on methotrexate weekly. --Spoke to Dr. 6/16---we will resume MTX 6/24. Recheck Tbili on 6/23 prior to resuming. Ordered. Folic acid changed back to 1mg  dose.  11. 7/24 HR tid--incidental finding years ago and has been controlled.             --  To follow up with Card? PCP regarding d/c of Xarelto per notes. 12. Constipation: Continue miralax.  13. Urinary retention: Has failed one voiding trial by Dr. Cardell Peach.             --plans for ultrasound of bladder/prostate? 06/24?             --Continue Flomax at bedtime.  -RN changed foley 6/17 14. Leucocytosis: Resolving without signs of infection             --encourage pulmonary toilet for likely Right atelectasis. 15. ABLA: Continue to monitor H/H with serial checks and monitor for signs of bleeding. --Monitor hematoma right hip./flank.             -6/15 hgb looks to be trending slowly up, 7.5 ---> 8.3  -Continueferrous gluconate 324mg  MWF.  16. H/o COPD: Stable. Did not like trial of MDI per wife.  17. Vitamin B12 deficiency: Continue Vitamin B12 daily ordered 18. Hypotension: asymptomatic, continue to monitor. Medications reviewed and on nothing purely for HTN.   LOS: 9 days A FACE TO FACE EVALUATION WAS PERFORMED  P An Lannan 10/22/2020, 1:35 PM

## 2020-10-22 NOTE — Progress Notes (Signed)
Occupational Therapy TBI Note  Patient Details  Name: Nathan Mitchell MRN: [id number] Date of Birth: 04-14-1931  Today's Date: 10/22/2020 OT Individual Time: 0700-0757 OT Individual Time Calculation (min): 57 min   Today's Date: 10/22/2020 OT Individual Time: 1435-1505 OT Individual Time Calculation (min): 30 min   Short Term Goals: Week 2:  OT Short Term Goal 1 (Week 2): Pt will use LRAD to don pants with min A OT Short Term Goal 2 (Week 2): Pt will complete walk in shower transfers with min A and min cueing OT Short Term Goal 3 (Week 2): Pt will require no more than min cueing during shower level bathing for initiation and thoroughness  Skilled Therapeutic Interventions/Progress Updates:    Session 1:  Pt received in  6/10 pain in R hip. Heat from shower utilized to relax muscles and RN alerted to pain for morning medications  ADL:  Pt completes bathing with CGA at sit to stand level with grab bar to wash buttocks in shower. (Hip incision covered with waterproof dressing. Pt completes UB dressing with supervision for VC for orientation of shirt d/t visual deficits Pt completes LB dressing with MIN A for coaching on reacher technique and VC for threading catheter bag into pants Pt completes footwear with MOD A overall (S to doff with tactile cues to know where to put reacher on heel and A to don) Pt completes toileting with A for thorough buttock hygiene after small BM Pt completes toileting transfer with CGA via ambulation with RW and VC for LLE first stpping up ramp threshold in bathroom Pt completes shower/Tub transfer with CGA with RW for short distance functional mobility in bathroom toilet>TTB in even entry shower Pt completes grooming with supervision at EOB   Pt left at end of session in bed with exit alarm on, call light in reach and all needs met   Session 2:  Pt received in w/c with 5 out of 10 pain in hip. LPN with Meds  provided for pain relief  ADL:  Pt  completes toileting with CGA for standing balncae while advancing pants past hips. Pt completes toileting transfer with CGA for ambulation with RW into and out of bathroom. Recall of which LE to step up threshold with    Therapeutic activity OT reapplies K tape in latern and cross weave pattern to R hip/inner thigh to improve edema and bruising.  Pt left at end of session in bed with exit alarm on, call light in reach and all needs met   Therapy Documentation Precautions:  Precautions Precautions: Fall Precaution Comments: foley, T4 compression fx (no brace) Restrictions Weight Bearing Restrictions: No RLE Weight Bearing: Weight bearing as tolerated LLE Weight Bearing: Weight bearing as tolerated General:   Vital Signs: Therapy Vitals Temp: 97.7 F (36.5 C) Pulse Rate: 80 Resp: 17 BP: 128/72 Patient Position (if appropriate): Lying Oxygen Therapy SpO2: 99 % Pain:   Agitated Behavior Scale: TBI   Observation Details Observation Environment: pt room Start of observation period - Date: 10/22/20 Start of observation period - Time: 0700 End of observation period - Date: 10/22/20 End of observation period - Time: 0800 Agitated Behavior Scale (DO NOT LEAVE BLANKS) Short attention span, easy distractibility, inability to concentrate: Absent Impulsive, impatient, low tolerance for pain or frustration: Absent Uncooperative, resistant to care, demanding: Absent Violent and/or threatening violence toward people or property: Absent Explosive and/or unpredictable anger: Absent Rocking, rubbing, moaning, or other self-stimulating behavior: Absent Pulling at tubes, restraints, etc.: Absent  Wandering from treatment areas: Absent Restlessness, pacing, excessive movement: Absent Repetitive behaviors, motor, and/or verbal: Absent Rapid, loud, or excessive talking: Absent Sudden changes of mood: Absent Easily initiated or excessive crying and/or laughter: Absent Self-abusiveness,  physical and/or verbal: Absent Agitated behavior scale total score: 14  ADL: ADL Eating: Supervision/safety Where Assessed-Eating: Chair Grooming: Minimal assistance Upper Body Bathing: Supervision/safety Where Assessed-Upper Body Bathing: Chair Lower Body Bathing: Maximal assistance Where Assessed-Lower Body Bathing: Chair Upper Body Dressing: Contact guard Where Assessed-Upper Body Dressing: Chair Lower Body Dressing: Dependent Where Assessed-Lower Body Dressing: Chair Toileting: Dependent Where Assessed-Toileting: Bedside Commode Toilet Transfer: Dependent Toilet Transfer Method:  (stedy- MOD A power up) Vision   Perception    Praxis   Exercises:   Other Treatments:     Therapy/Group: Individual Therapy  Tonny Branch 10/22/2020, 7:57 AM

## 2020-10-22 NOTE — Progress Notes (Signed)
Speech Language Pathology TBI Note  Patient Details  Name: Nathan Mitchell MRN: [id number] Date of Birth: 10/24/1930  Today's Date: 10/22/2020 SLP Individual Time: 5176-1607 SLP Individual Time Calculation (min): 45 min  Short Term Goals: Week 2: SLP Short Term Goal 1 (Week 2): Patient will recall daily, functional information with occ verbal cues for use of strategies. SLP Short Term Goal 2 (Week 2): Patient will appropriately initiate and sequence during functional tasks and ADLs with Min A verbal cues  Skilled Therapeutic Interventions: Skilled ST treatment performed with focus on cognitive goals. SLP facilitated anticipatory problem solving/safety scenarios with mod-to-max assist to generate appropriate solutions and responses were suggestive of poor safety awareness. Patient exhibited reduced perspective taking and would often state "I never had a problem with that before" and "I won't have a problem with it at home." SLP facilitated mental manipulation and sequencing task with Min A verbal cues for accuracy. It is recommended patient have 24/7 supervision at discharge due to limited insight into deficits and safety awareness. Patient was left in bed with alarm activated and needs within reach. Continue per ST POC.  Pain Pain Assessment Pain Scale: 0-10 Pain Score: 5  Pain Location: Hip Pain Orientation: Right Pain Intervention(s): Medication (See eMAR);RN made aware  Agitated Behavior Scale: TBI Observation Details Observation Environment: pt room Start of observation period - Date: 10/22/20 Start of observation period - Time: 0700 End of observation period - Date: 10/22/20 End of observation period - Time: 0800 Agitated Behavior Scale (DO NOT LEAVE BLANKS) Short attention span, easy distractibility, inability to concentrate: Absent Impulsive, impatient, low tolerance for pain or frustration: Absent Uncooperative, resistant to care, demanding: Absent Violent and/or  threatening violence toward people or property: Absent Explosive and/or unpredictable anger: Absent Rocking, rubbing, moaning, or other self-stimulating behavior: Absent Pulling at tubes, restraints, etc.: Absent Wandering from treatment areas: Absent Restlessness, pacing, excessive movement: Absent Repetitive behaviors, motor, and/or verbal: Absent Rapid, loud, or excessive talking: Absent Sudden changes of mood: Absent Easily initiated or excessive crying and/or laughter: Absent Self-abusiveness, physical and/or verbal: Absent Agitated behavior scale total score: 14  Therapy/Group: Individual Therapy  Tamala Ser 10/22/2020, 8:41 AM

## 2020-10-22 NOTE — Progress Notes (Signed)
Physical Therapy Session Note  Patient Details  Name: Nathan Mitchell MRN: [id number] Date of Birth: 11/10/1930  Today's Date: 10/22/2020 PT Individual Time: 1102-1215 PT Individual Time Calculation (min): 73 min  Short Term Goals: Week 2:  PT Short Term Goal 1 (Week 2): Patient will perform basic transfers with CGA consistently using LRAD. PT Short Term Goal 2 (Week 2): Patient will ambulate >75 ft using LRAD with CGA and reciprocal gait pattern. PT Short Term Goal 3 (Week 2): patient will perform standing balance during a functional task x5 min with CGA using LRAD.  Skilled Therapeutic Interventions/Progress Updates:     Patient in bed in the room upon PT arrival. Patient alert and agreeable to PT session. Patient reported 5-7/10 R hip pain during session, RN made aware and provided medication during session. PT provided repositioning, rest breaks, and distraction as pain interventions throughout session.   Vitals: HR 140's with patient presenting with SOB when initially sitting EOB, maintained between 70s-90s throughout remainder of session, SPO2 97-100%.  Therapeutic Activity: Bed Mobility: Patient performed supine to sit with min A-CGA for R lower extremity management. Provided verbal cues for hip abduction to self-initiate R lower extremity movement. Transfers: Patient performed sit to/from stand x3 with CGA using RW. Provided verbal cues for hand placement x1 and forward weight shift both trials without increased assist.  Gait Training:  Patient ambulated 104 feet using RW with CGA and w/c follow for safety due to decreased activity tolerance. Ambulated with antalgic gait on R, step-to progressing to step through gait patter with reduced step length on L and weight shift R, increased upper extremity support, mild forward trunk lean and downward head gaze. Provided verbal cues for looking ahead, visual scanning to identify objects in his available visual field, increased L step  length, and paced breathing.  Patient's nurse from his church, Clydie Braun, arrived during session. Patient provided permission to discuss patient's home set-up with Clydie Braun. Spent >25 min discussing home set-up and assistance available at home. Requested measurements for home furniture, doorways, bathroom, shower, and car seat height. Educated on having family remove throw rugs as able to reduce tripping hazard and measure narrow spaces to assess fit for RW. Clydie Braun reports that the patient's wife, [name], has secured a home aid to assist the patient during the day. Discussed concerns with night time care and 24/7 supervision, reports she will discuss adding assistance in the evening. Patient stated he does not want to ask his children to assist, but agreeable to intermittent assist at times.   Patient in w/c in the room at end of session with breaks locked, chair alarm set, and all needs within reach.   Therapy Documentation Precautions:  Precautions Precautions: Fall Precaution Comments: foley, T4 compression fx (no brace) Restrictions Weight Bearing Restrictions: No RLE Weight Bearing: Weight bearing as tolerated LLE Weight Bearing: Weight bearing as tolerated    Therapy/Group: Individual Therapy  Shambhavi Salley L Said Rueb PT, DPT  10/22/2020, 6:46 AM

## 2020-10-23 MED ORDER — JUVEN PO PACK
1.0000 | PACK | Freq: Two times a day (BID) | ORAL | Status: DC
  Administered 2020-10-23 – 2020-11-03 (×22): 1 via ORAL
  Filled 2020-10-23 (×24): qty 1

## 2020-10-23 NOTE — Progress Notes (Signed)
Occupational Therapy TBI Note  Patient Details  Name: Nathan Mitchell MRN: [id number] Date of Birth: 1930-11-05  Today's Date: 10/23/2020 OT Individual Time: 0702-0759 OT Individual Time Calculation (min): 57 min    Short Term Goals: Week 1:  OT Short Term Goal 1 (Week 1): Pt will stand pivot transfer to BSC/toilet wiht MOD A consistently OT Short Term Goal 1 - Progress (Week 1): Met OT Short Term Goal 2 (Week 1): Pt will thread 1LE into pants OT Short Term Goal 2 - Progress (Week 1): Progressing toward goal OT Short Term Goal 3 (Week 1): Pt will groom wiht S OT Short Term Goal 3 - Progress (Week 1): Met  Skilled Therapeutic Interventions/Progress Updates:     Pt received in  8/10 pain in R hip. Heat from shower utilized to relax muscles and RN alerted to pain for morning medications   ADL:   Pt completes bathing with CGA at sit to stand level with grab bar to wash buttocks in shower. (Hip incision covered with waterproof dressing. LHSS utilized to wash BLE and instruciton to use for washing back Pt completes UB dressing with set up Pt completes LB dressing with MAX A sit to stand level at recliner d/t time constraints Pt completes footwear with MIN A overall (S to doff with tactile cues to know where to put reacher on heel and MIN  A to don after reviewing sock aide technique. Will need more time to practice for carryover at home Pt completes toileting with A for thorough buttock hygiene after small BM Pt completes toileting transfer with CGA via ambulation with RW and VC for LLE first stpping up ramp threshold in bathroom Pt completes shower/Tub transfer with CGA with RW for short distance functional mobility in bathroom toilet>TTB in even entry shower Pt completes grooming with supervision at EOB     Pt left at end of session in recliner with exit alarm on, call light in reach and all needs met   Therapy Documentation Precautions:  Precautions Precautions:  Fall Precaution Comments: foley, T4 compression fx (no brace) Restrictions Weight Bearing Restrictions: No RLE Weight Bearing: Weight bearing as tolerated LLE Weight Bearing: Weight bearing as tolerated General:   Vital Signs: Therapy Vitals Temp: 98.3 F (36.8 C) Pulse Rate: 72 Resp: 17 BP: 135/71 Patient Position (if appropriate): Lying Oxygen Therapy SpO2: 96 % Pain:   Agitated Behavior Scale: TBI  Observation Details Observation Environment: pt room Start of observation period - Date: 10/23/20 Start of observation period - Time: 0700 End of observation period - Date: 10/23/20 End of observation period - Time: 0800 Agitated Behavior Scale (DO NOT LEAVE BLANKS) Short attention span, easy distractibility, inability to concentrate: Absent Impulsive, impatient, low tolerance for pain or frustration: Absent Uncooperative, resistant to care, demanding: Absent Violent and/or threatening violence toward people or property: Absent Explosive and/or unpredictable anger: Absent Rocking, rubbing, moaning, or other self-stimulating behavior: Absent Pulling at tubes, restraints, etc.: Absent Wandering from treatment areas: Absent Restlessness, pacing, excessive movement: Absent Repetitive behaviors, motor, and/or verbal: Absent Rapid, loud, or excessive talking: Absent Sudden changes of mood: Absent Easily initiated or excessive crying and/or laughter: Absent Self-abusiveness, physical and/or verbal: Absent Agitated behavior scale total score: 14  ADL: ADL Eating: Supervision/safety Where Assessed-Eating: Chair Grooming: Minimal assistance Upper Body Bathing: Supervision/safety Where Assessed-Upper Body Bathing: Chair Lower Body Bathing: Maximal assistance Where Assessed-Lower Body Bathing: Chair Upper Body Dressing: Contact guard Where Assessed-Upper Body Dressing: Chair Lower Body Dressing: Dependent Where Assessed-Lower  Body Dressing: Chair Toileting: Dependent Where  Assessed-Toileting: Bedside Commode Toilet Transfer: Dependent Toilet Transfer Method:  (stedy- MOD A power up) Vision   Perception    Praxis   Exercises:   Other Treatments:     Therapy/Group: Individual Therapy  Tonny Branch 10/23/2020, 7:29 AM

## 2020-10-23 NOTE — Progress Notes (Signed)
Physical Therapy Session Note  Patient Details  Name: Nathan Mitchell MRN: [id number] Date of Birth: [birth year]  Today's Date: 10/23/2020 PT Individual Time: 1415-1453 PT Individual Time Calculation (min): 38 min   Short Term Goals: Week 1:  PT Short Term Goal 1 (Week 1): Patient will perform bed mobility with min A consistently. PT Short Term Goal 1 - Progress (Week 1): Met PT Short Term Goal 2 (Week 1): Patient will perform basic transfers with min A consistently. PT Short Term Goal 2 - Progress (Week 1): Met PT Short Term Goal 3 (Week 1): Patient will ambulate >25 ft using LRAD. PT Short Term Goal 3 - Progress (Week 1): Met PT Short Term Goal 4 (Week 1): Patient will propel w/c >100 ft PT Short Term Goal 4 - Progress (Week 1): Met Week 2:  PT Short Term Goal 1 (Week 2): Patient will perform basic transfers with CGA consistently using LRAD. PT Short Term Goal 2 (Week 2): Patient will ambulate >75 ft using LRAD with CGA and reciprocal gait pattern. PT Short Term Goal 3 (Week 2): patient will perform standing balance during a functional task x5 min with CGA using LRAD. Week 3:     Skilled Therapeutic Interventions/Progress Updates:  PAIN RLE w/activity, treatment to tolerance, nursing provided timed pain meds at beginnning of session.  Pt initially supine w/visitor at bedside.  Supine to sit w/use of bedrail, additional time, supervision. Pt sat 2-3 min to "get his bearings" denies dizzyness.  Nursing provided pain meds, supervision only for sitting. Sit to stand w/cga from elevated bed to RW, cues for hand placement.  Gait 169f w/RW, cues for posture, antalgic somewhat crouched gait w/progresses w/fatigue, dyspnea q/exertion, slow cadence.  Turns/sit to wc w/cga, good safety awareness, cues for navigation due to visual deficits. Pt recovers several min due to dyspnea w/exertion.  Therapist provided pt w/water pitcher, nursing stated no restrictions.   Standing therex  w/RW: Alternating overhead reach, alternating crossbody reach for balance challenges x 3 min w/cga. Gait 182fwc to bedside w/RW, cga, cues for navigation. Sit to supine w/min assist for rle management, cues for scooting/repositioning. Pt left supine w/rails up x 3, alarm set, bed in lowest position, and needs in reach. Improving gait endurance, decreased complaints of pain, very pleasant.   Therapy Documentation Precautions:  Precautions Precautions: Fall Precaution Comments: foley, T4 compression fx (no brace) Restrictions Weight Bearing Restrictions: No RLE Weight Bearing: Weight bearing as tolerated LLE Weight Bearing: Weight bearing as tolerated    Therapy/Group: Individual Therapy BaCallie FieldingPTEvanston/24/2022, 5:07 PM

## 2020-10-23 NOTE — Progress Notes (Addendum)
Patient ID: Nathan Mitchell, male   DOB: 1930/08/01, 87 y.o.   MRN: [id number]  SW received phone call from pt dtr Nathan Mitchell who was inquiring about discharge process and when updates from team conference occur. SW provided updates on pt current progress, and recommendation for 24/7 care for pt going forward. SW informed it was suggested that an aide be hired for the evening as well. SW discussed and explained family education and the suggested date/time with her mother. She reports she will be here on Friday, July 1 at 10am-12pm/1pm-3pm for family education with her mother. SW offered her to call SW for further updates if needed.   *SW received return phone call from pt wife who reported that she will be here on Friday for family edu and facetime children. SW reiterated will share information his d/c needs once aware.   Nathan Mitchell, MSW, LCSWA Office: [phone] Cell: [phone] Fax: [phone]

## 2020-10-23 NOTE — Progress Notes (Signed)
Speech Language Pathology TBI Note  Patient Details  Name: Nathan Mitchell MRN: [id number] Date of Birth: 08-Mar-1931  Today's Date: 10/23/2020 SLP Individual Time: 5621-3086 SLP Individual Time Calculation (min): 45 min  Short Term Goals: Week 2: SLP Short Term Goal 1 (Week 2): Patient will recall daily, functional information with occ verbal cues for use of strategies. SLP Short Term Goal 2 (Week 2): Patient will appropriately initiate and sequence during functional tasks and ADLs with Min A verbal cues  Skilled Therapeutic Interventions: Skilled ST treatment performed with focus on cognitive goals. SLP facilitated working memory, short-term recall, and abstract reasoning tasks with Min A verbal cues. Patient benefited from additional processing time and intermittent verbal repetition for short-term re-call. Exhibited increased insight into physical deficits on this date, which may have attributed to increased hip pain thus increased awareness of hip injury and limitations. Nurse made aware of hip pain. Patient was transferred to bed with alarm activated and needs within reach. Continue per ST POC.    Pain Pain Assessment Pain Scale: 0-10 Pain Score: 8  Pain Type: Acute pain Pain Location: Hip Pain Orientation: Right Pain Descriptors / Indicators: Aching Pain Intervention(s): Repositioned (Patient pain medication is not due until 12. Patient agreed to reposition)  Agitated Behavior Scale: TBI Observation Details Observation Environment: patient room Start of observation period - Date: 10/23/20 Start of observation period - Time: 0900 End of observation period - Date: 10/23/20 End of observation period - Time: 0945 Agitated Behavior Scale (DO NOT LEAVE BLANKS) Short attention span, easy distractibility, inability to concentrate: Absent Impulsive, impatient, low tolerance for pain or frustration: Absent Uncooperative, resistant to care, demanding: Absent Violent and/or  threatening violence toward people or property: Absent Explosive and/or unpredictable anger: Absent Rocking, rubbing, moaning, or other self-stimulating behavior: Absent Pulling at tubes, restraints, etc.: Absent Wandering from treatment areas: Absent Restlessness, pacing, excessive movement: Absent Repetitive behaviors, motor, and/or verbal: Absent Rapid, loud, or excessive talking: Absent Sudden changes of mood: Absent Easily initiated or excessive crying and/or laughter: Absent Self-abusiveness, physical and/or verbal: Absent Agitated behavior scale total score: 14  Therapy/Group: Individual Therapy  Tamala Ser 10/23/2020, 12:21 PM

## 2020-10-23 NOTE — Progress Notes (Signed)
PROGRESS NOTE   Subjective/Complaints: C/o hip pain, helped him to call for oxycodone. He has no other complaints T. Billi normalized, methotrexate administered this morning.  Albumin low  ROS: Patient denies CP, SOB, N/V/D, +hip pain with movement, +labored breathing during sessions as per therapy  Objective:   No results found. No results for input(s): WBC, HGB, HCT, PLT in the last 72 hours.  Recent Labs    10/22/20 0434  NA 139  K 4.0  CL 107  CO2 28  GLUCOSE 111*  BUN 16  CREATININE 1.11  CALCIUM 8.4*    Intake/Output Summary (Last 24 hours) at 10/23/2020 1133 Last data filed at 10/23/2020 0845 Gross per 24 hour  Intake 480 ml  Output 950 ml  Net -470 ml        Physical Exam: Vital Signs Blood pressure (!) 120/56, pulse 97, temperature (!) 97.5 F (36.4 C), temperature source Oral, resp. rate 16, height 6\' 1"  (1.854 m), weight 81.7 kg, SpO2 100 %. Gen: no distress, normal appearing HEENT: oral mucosa pink and moist, NCAT Cardio: Reg rate Chest: normal effort, normal rate of breathing Abd: soft, non-distended Ext: no edema Psych: pleasant, normal affect Skin: intact  Neuro: Pt is cognitively appropriate with normal insight, memory, and awareness. Cranial nerves 2-12 are intact. Sensory exam is normal. Reflexes are 2+ in all 4's. Fine motor coordination is intact. No tremors. Motor function is grossly 5/5 in UE's. RLE 2/5 d/t pain prox to 4/5 distally. LLE 3-5/5 prox to distal.--stable  Musculoskeletal:  right hip tender with rom, edema better  Assessment/Plan: 1. Functional deficits which require 3+ hours per day of interdisciplinary therapy in a comprehensive inpatient rehab setting. Physiatrist is providing close team supervision and 24 hour management of active medical problems listed below. Physiatrist and rehab team continue to assess barriers to discharge/monitor patient progress toward  functional and medical goals  Care Tool:  Bathing    Body parts bathed by patient: Right arm, Left arm, Chest, Abdomen, Front perineal area, Right upper leg, Left upper leg, Face, Left lower leg, Right lower leg   Body parts bathed by helper: Right lower leg, Left lower leg, Buttocks     Bathing assist Assist Level: Contact Guard/Touching assist     Upper Body Dressing/Undressing Upper body dressing   What is the patient wearing?: Pull over shirt    Upper body assist Assist Level: Supervision/Verbal cueing    Lower Body Dressing/Undressing Lower body dressing      What is the patient wearing?: Pants     Lower body assist Assist for lower body dressing: Minimal Assistance - Patient > 75%     Toileting Toileting    Toileting assist Assist for toileting: Minimal Assistance - Patient > 75%     Transfers Chair/bed transfer  Transfers assist     Chair/bed transfer assist level: Contact Guard/Touching assist Chair/bed transfer assistive device:   Ambulation assist      Assist level: Contact Guard/Touching assist Assistive device: Walker-rolling Max distance: 62 ft   Walk 10 feet activity   Assist  Walk 10 feet activity did not occur: Safety/medical concerns (Limited by increased  R hip pain)  Assist level: Contact Guard/Touching assist Assistive device: Walker-rolling   Walk 50 feet activity   Assist Walk 50 feet with 2 turns activity did not occur: Safety/medical concerns  Assist level: Contact Guard/Touching assist Assistive device: Walker-rolling    Walk 150 feet activity   Assist Walk 150 feet activity did not occur: Safety/medical concerns         Walk 10 feet on uneven surface  activity   Assist Walk 10 feet on uneven surfaces activity did not occur: Safety/medical concerns         Wheelchair     Assist Will patient use wheelchair at discharge?: Yes Type of Wheelchair: Manual Wheelchair  activity did not occur: Safety/medical concerns (Limited by increased R hip pain during evaluation)  Wheelchair assist level: Supervision/Verbal cueing Max wheelchair distance: 55 ft    Wheelchair 50 feet with 2 turns activity    Assist    Wheelchair 50 feet with 2 turns activity did not occur: Safety/medical concerns   Assist Level: Supervision/Verbal cueing   Wheelchair 150 feet activity     Assist  Wheelchair 150 feet activity did not occur: Safety/medical concerns   Assist Level: Contact Guard/Touching assist   Blood pressure (!) 120/56, pulse 97, temperature (!) 97.5 F (36.4 C), temperature source Oral, resp. rate 16, height 6\' 1"  (1.854 m), weight 81.7 kg, SpO2 100 %.        Medical Problem List and Plan: 1.  TBI with associated SDH. Also suffered right IT hip fracture, T4 compression fx             -patient may not shower             -ELOS/Goals: MinA 2-3 weeks             -Continue CIR therapies including PT, OT, and SLP  2.  Impaired mobility -DVT/anticoagulation:  Pharmaceutical: Continue Lovenox bid, with check for ortho how long he will need this so we can provide injection education if warranted.              -antiplatelet therapy: N/A 3. Right IT hip fracture: Schedule tylenol 650mg  TID. LFTs reviewed and wnl. Continue oxycodone/robaxin prn, discussed with team that he is not using much, can consider using prior to therapy to maximize participation.  4. Mood: LCSW to follow for evaluation and support.             -antipsychotic agents: N/A 5. Neuropsych: This patient is not fully capable of making decisions on his own behalf.             --cognition improving.   -continue to monitor sleep chart  -avoid neurosedating meds 6. Skin/Wound Care: foam dressing to right hip incision-minimal to no drainage 7. Fluids/Electrolytes/Nutrition: Appetite has been good per wife. -- encourage PO. Electrolytes reviewed 6/23 and stable 8. Right femur fracture: WBAT.  Prevena VAC out             --wounds healing nicely  -remove staples from scalp today 9. 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 off since 6/16  no seizures            10 Giant cell arteritis with visual loss: Was on methotrexate weekly. --Spoke to Dr. 7/23 6/16---we will resume MTX 6/24. T bili normalized. Folic acid changed back to 1mg  dose.  11. Nickola Major HR tid--incidental finding years ago and has been controlled.             --  To follow up with Card? PCP regarding d/c of Xarelto per notes. 12. Constipation: Continue miralax.  13. Urinary retention: Has failed one voiding trial by Dr. Cardell Peach.             --plans for ultrasound of bladder/prostate? 06/24?             --Continue Flomax at bedtime.  -RN changed foley 6/17 14. Leucocytosis: Resolving without signs of infection             --encourage pulmonary toilet for likely Right atelectasis. 15. ABLA: Continue to monitor H/H with serial checks and monitor for signs of bleeding. --Monitor hematoma right hip./flank.             -6/15 hgb looks to be trending slowly up, 7.5 ---> 8.3  -Continueferrous gluconate 324mg  MWF.  16. H/o COPD: Stable. Did not like trial of MDI per wife.  17. Vitamin B12 deficiency: Continue Vitamin B12 daily ordered 18. Hypotension: asymptomatic, continue to monitor. Medications reviewed and on nothing purely for HTN.  18. Hypoalbuminemia: Juven ordered with meals.   LOS: 10 days A FACE TO FACE EVALUATION WAS PERFORMED  Alberto Pina 10/23/2020, 11:33 AM

## 2020-10-23 NOTE — Progress Notes (Signed)
Physical Therapy Session Note  Patient Details  Name: Nathan Mitchell MRN: [id number] Date of Birth: [birth year]  Today's Date: 10/23/2020 PT Individual Time: 4081-4481 PT Individual Time Calculation (min): 54 min   Short Term Goals: Week 2:  PT Short Term Goal 1 (Week 2): Patient will perform basic transfers with CGA consistently using LRAD. PT Short Term Goal 2 (Week 2): Patient will ambulate >75 ft using LRAD with CGA and reciprocal gait pattern. PT Short Term Goal 3 (Week 2): patient will perform standing balance during a functional task x5 min with CGA using LRAD.  Skilled Therapeutic Interventions/Progress Updates:   Pt received supine in bed and agreeable to PT. Supine>sit transfer with min assist and cues for safety once EOB.   Sit<>stand from EOB with min assist and cues for UE placement. Supervision assist for all sit<>stand from various sitting surfaces with min cues for safety.   Ambulatory transfer to [facility] with CGA. Gait training with RW x 145f with min-CGA from PT with cues for safety with turning and management of AD. Mild pain intermittently through the RLE .   Dynamic balance training to reach for and toss bean bags to target. X 12 BUE with 1 UE support on RW. No LOB or pain noted with lateral reahcing.   WC mobility with CGA to maintain straight path x 1266fwith cues for awareness of obstacles.   [facility] to [facility] and all needs met.         Therapy Documentation Precautions:  Precautions Precautions: Fall Precaution Comments: foley, T4 compression fx (no brace) Restrictions Weight Bearing Restrictions: No RLE Weight Bearing: Weight bearing as tolerated LLE Weight Bearing: Weight bearing as tolerated    Pain: Pain Assessment Pain Scale: 0-10 Pain Score: 8  Pain Type: Acute pain Pain Location: Hip Pain Orientation: Right Pain Radiating Towards: leg Pain Descriptors / Indicators: Aching Pain  Intervention(s): Medication (See eMAR)    Therapy/Group: Individual Therapy  AuLorie Phenix/24/2022, 5:41 PM

## 2020-10-24 ENCOUNTER — Inpatient Hospital Stay (HOSPITAL_COMMUNITY): Payer: Medicare PPO

## 2020-10-24 NOTE — Progress Notes (Signed)
Occupational Therapy TBI Note  Patient Details  Name: Nathan Mitchell MRN: [id number] Date of Birth: 28-Jun-1930  Today's Date: 10/24/2020 OT Individual Time: 0700-0800 OT Individual Time Calculation (min): 60 min    Short Term Goals: Week 1:  OT Short Term Goal 1 (Week 1): Pt will stand pivot transfer to BSC/toilet wiht MOD A consistently OT Short Term Goal 1 - Progress (Week 1): Met OT Short Term Goal 2 (Week 1): Pt will thread 1LE into pants OT Short Term Goal 2 - Progress (Week 1): Progressing toward goal OT Short Term Goal 3 (Week 1): Pt will groom wiht S OT Short Term Goal 3 - Progress (Week 1): Met  Skilled Therapeutic Interventions/Progress Updates:     Pt received in  3/10 pain in R hip at rest, 7/10 with mobility. Heat from shower in morning routine utilized to relax muscles and RN alerted to pain for morning medications   ADL: Ot provides choice of clothing while finishing breakfast for time management. Pt completes bathing with S at  seated level with lateral and anterior leans to wash buttocks in shower. Hip incision covered with waterproof dressing. LHSS utilized to wash BLE and instruciton to use for washing back Pt completes UB dressing with set up Pt completes LB dressing with max A for threading BLE d/t time constraints. OT applies K tape to lateral R leg for edema management Pt completes footwear with MOD A overall (S to doff with tactile cues to know where to put reacher on heel and  A to don d/t time).Pt completes toileting with close S-intermittent CGA for CM and hygiene in standing Pt completes toileting transfer with CS via ambulation with RW. Pt with audible labored breathing but O2 sats WNL Pt completes shower/Tub transfer with S with RW for short distance functional mobility in bathroom toilet>TTB in even entry shower   Pt left at end of session in bed with exit alarm on, call light in reach and all needs met     Therapy Documentation Precautions:   Precautions Precautions: Fall Precaution Comments: foley, T4 compression fx (no brace) Restrictions Weight Bearing Restrictions: No RLE Weight Bearing: Weight bearing as tolerated LLE Weight Bearing: Weight bearing as tolerated General:   Vital Signs: Therapy Vitals Temp: 98.1 F (36.7 C) Temp Source: Oral Pulse Rate: 79 Resp: 17 BP: 139/68 Patient Position (if appropriate): Lying Oxygen Therapy SpO2: 100 % Pain:   Agitated Behavior Scale: TBI  Observation Details Observation Environment: pt room Start of observation period - Date: 10/24/20 Start of observation period - Time: 0700 End of observation period - Date: 10/24/20 End of observation period - Time: 0800 Agitated Behavior Scale (DO NOT LEAVE BLANKS) Short attention span, easy distractibility, inability to concentrate: Absent Impulsive, impatient, low tolerance for pain or frustration: Absent Uncooperative, resistant to care, demanding: Absent Violent and/or threatening violence toward people or property: Absent Explosive and/or unpredictable anger: Absent Rocking, rubbing, moaning, or other self-stimulating behavior: Absent Pulling at tubes, restraints, etc.: Absent Wandering from treatment areas: Absent Restlessness, pacing, excessive movement: Absent Repetitive behaviors, motor, and/or verbal: Absent Rapid, loud, or excessive talking: Absent Sudden changes of mood: Absent Easily initiated or excessive crying and/or laughter: Absent Self-abusiveness, physical and/or verbal: Absent Agitated behavior scale total score: 14  ADL: ADL Eating: Supervision/safety Where Assessed-Eating: Chair Grooming: Minimal assistance Upper Body Bathing: Supervision/safety Where Assessed-Upper Body Bathing: Chair Lower Body Bathing: Maximal assistance Where Assessed-Lower Body Bathing: Chair Upper Body Dressing: Contact guard Where Assessed-Upper Body Dressing: Chair  Lower Body Dressing: Dependent Where Assessed-Lower  Body Dressing: Chair Toileting: Dependent Where Assessed-Toileting: Bedside Commode Toilet Transfer: Dependent Toilet Transfer Method:  (stedy- MOD A power up) Vision   Perception    Praxis   Exercises:   Other Treatments:     Therapy/Group: Individual Therapy  Tonny Branch 10/24/2020, 6:54 AM

## 2020-10-24 NOTE — Progress Notes (Signed)
PROGRESS NOTE   Subjective/Complaints: No complaints this morning Complained of 5/10 hip pain when working with SLP today.  Easily arousable.  Vitals stable  ROS: Patient denies CP, SOB, N/V/D, +hip pain with movement, +labored breathing during sessions as per therapy  Objective:   DG FEMUR PORT, MIN 2 VIEWS RIGHT  Result Date: 10/24/2020 CLINICAL DATA:  Right intramedullary nail placement. EXAM: RIGHT FEMUR PORTABLE 2 VIEW COMPARISON:  October 07, 2020 FINDINGS: Rods have been placed across the right intertrochanteric fracture. Gamma nails and the femoral intramedullary rod are in good position. IMPRESSION: Repair of right intertrochanteric fracture as above. Hardware is in good position. Alignment is improved. Electronically Signed   By: Gerome Sam III M.D   On: 10/24/2020 10:36   No results for input(s): WBC, HGB, HCT, PLT in the last 72 hours.  Recent Labs    10/22/20 0434  NA 139  K 4.0  CL 107  CO2 28  GLUCOSE 111*  BUN 16  CREATININE 1.11  CALCIUM 8.4*    Intake/Output Summary (Last 24 hours) at 10/24/2020 1230 Last data filed at 10/24/2020 0900 Gross per 24 hour  Intake 560 ml  Output 850 ml  Net -290 ml        Physical Exam: Vital Signs Blood pressure 116/63, pulse 95, temperature 98.4 F (36.9 C), temperature source Oral, resp. rate 16, height 6\' 1"  (1.854 m), weight 82 kg, SpO2 97 %. Gen: no distress, normal appearing HEENT: oral mucosa pink and moist, NCAT Cardio: Reg rate Chest: normal effort, normal rate of breathing Abd: soft, non-distended Ext: no edema Psych: pleasant, normal affect Skin: intact  Neuro: Pt is cognitively appropriate with normal insight, memory, and awareness. Cranial nerves 2-12 are intact. Sensory exam is normal. Reflexes are 2+ in all 4's. Fine motor coordination is intact. No tremors. Motor function is grossly 5/5 in UE's. RLE 2/5 d/t pain prox to 4/5 distally. LLE 3-5/5  prox to distal.--stable  Musculoskeletal:  right hip tender with rom, edema better  Assessment/Plan: 1. Functional deficits which require 3+ hours per day of interdisciplinary therapy in a comprehensive inpatient rehab setting. Physiatrist is providing close team supervision and 24 hour management of active medical problems listed below. Physiatrist and rehab team continue to assess barriers to discharge/monitor patient progress toward functional and medical goals  Care Tool:  Bathing    Body parts bathed by patient: Right arm, Left arm, Chest, Abdomen, Front perineal area, Right upper leg, Left upper leg, Face, Left lower leg, Right lower leg   Body parts bathed by helper: Right lower leg, Left lower leg, Buttocks     Bathing assist Assist Level: Contact Guard/Touching assist     Upper Body Dressing/Undressing Upper body dressing   What is the patient wearing?: Pull over shirt    Upper body assist Assist Level: Supervision/Verbal cueing    Lower Body Dressing/Undressing Lower body dressing      What is the patient wearing?: Pants     Lower body assist Assist for lower body dressing: Minimal Assistance - Patient > 75%     Toileting Toileting    Toileting assist Assist for toileting: Minimal Assistance - Patient >  75%     Transfers Chair/bed transfer  Transfers assist     Chair/bed transfer assist level: Contact Guard/Touching assist Chair/bed transfer assistive device: Geologist, engineering   Ambulation assist      Assist level: Contact Guard/Touching assist Assistive device: Walker-rolling Max distance: 62 ft   Walk 10 feet activity   Assist  Walk 10 feet activity did not occur: Safety/medical concerns (Limited by increased R hip pain)  Assist level: Contact Guard/Touching assist Assistive device: Walker-rolling   Walk 50 feet activity   Assist Walk 50 feet with 2 turns activity did not occur: Safety/medical concerns  Assist  level: Contact Guard/Touching assist Assistive device: Walker-rolling    Walk 150 feet activity   Assist Walk 150 feet activity did not occur: Safety/medical concerns         Walk 10 feet on uneven surface  activity   Assist Walk 10 feet on uneven surfaces activity did not occur: Safety/medical concerns         Wheelchair     Assist Will patient use wheelchair at discharge?: Yes Type of Wheelchair: Manual Wheelchair activity did not occur: Safety/medical concerns (Limited by increased R hip pain during evaluation)  Wheelchair assist level: Supervision/Verbal cueing Max wheelchair distance: 55 ft    Wheelchair 50 feet with 2 turns activity    Assist    Wheelchair 50 feet with 2 turns activity did not occur: Safety/medical concerns   Assist Level: Supervision/Verbal cueing   Wheelchair 150 feet activity     Assist  Wheelchair 150 feet activity did not occur: Safety/medical concerns   Assist Level: Contact Guard/Touching assist   Blood pressure 116/63, pulse 95, temperature 98.4 F (36.9 C), temperature source Oral, resp. rate 16, height 6\' 1"  (1.854 m), weight 82 kg, SpO2 97 %.        Medical Problem List and Plan: 1.  TBI with associated SDH. Also suffered right IT hip fracture, T4 compression fx             -patient may not shower             -ELOS/Goals: MinA 2-3 weeks             -Continue CIR therapies including PT, OT, and SLP  2.  Impaired mobility -DVT/anticoagulation:  Pharmaceutical: Continue Lovenox bid, need to check with ortho how long he will need this so we can provide injection education if warranted.              -antiplatelet therapy: N/A 3. Right IT hip fracture: Schedule tylenol 650mg  TID. LFTs reviewed and wnl. Continue oxycodone/robaxin prn, discussed with team that he is not using much, can consider using prior to therapy to maximize participation.  4. Mood: LCSW to follow for evaluation and support.              -antipsychotic agents: N/A 5. Neuropsych: This patient is not fully capable of making decisions on his own behalf.             --cognition improving.   -continue to monitor sleep chart  -avoid neurosedating meds 6. Skin/Wound Care: foam dressing to right hip incision-minimal to no drainage 7. Fluids/Electrolytes/Nutrition: Appetite has been good per wife. -- encourage PO. Electrolytes reviewed 6/23 and stable 8. Right femur fracture: WBAT. Prevena VAC out             --wounds healing nicely  -remove staples from scalp today 9. L-SDH: off Keprra since 6/16  no  seizures            10 Giant cell arteritis with visual loss: Was on methotrexate weekly. --Spoke to Dr. Nickola Major 6/16---resumed MTX 6/24. T bili normalized. Folic acid changed back to 1mg  dose.  11. HR tid--incidental finding years ago and has been controlled.             --To follow up with Card? PCP regarding d/c of Xarelto per notes. 12. Constipation: Continue miralax.  13. Urinary retention: Has failed one voiding trial by Dr. LZJ:[id number].             --plans for ultrasound of bladder/prostate? 06/24?             --Continue Flomax at bedtime.  -RN changed foley 6/17 14. Leucocytosis: Resolving without signs of infection             --encourage pulmonary toilet for likely Right atelectasis. 15. ABLA: Continue to monitor H/H with serial checks and monitor for signs of bleeding. --Monitor hematoma right hip./flank.             -6/15 hgb looks to be trending slowly up, 7.5 ---> 8.3  -Continueferrous gluconate 324mg  MWF.  16. H/o COPD: Stable. Did not like trial of MDI per wife.  17. Vitamin B12 deficiency: Continue Vitamin B12 09-03-1986 daily ordered 18. Hypotension: asymptomatic, continue to monitor. Medications reviewed and on nothing purely for HTN. Improving.  18. Hypoalbuminemia: Juven ordered with meals.   LOS: 11 days A FACE TO FACE EVALUATION WAS PERFORMED  P Chrisoula Zegarra 10/24/2020, 12:30 PM

## 2020-10-24 NOTE — Progress Notes (Signed)
Speech Language Pathology TBI Note  Patient Details  Name: Nathan Mitchell MRN: [id number] Date of Birth: 04-07-1931  Today's Date: 10/24/2020 SLP Individual Time: 1031-1100 SLP Individual Time Calculation (min): 29 min  Short Term Goals: Week 2: SLP Short Term Goal 1 (Week 2): Patient will recall daily, functional information with occ verbal cues for use of strategies. SLP Short Term Goal 2 (Week 2): Patient will appropriately initiate and sequence during functional tasks and ADLs with Min A verbal cues  Skilled Therapeutic Interventions: Pt seen for skilled ST with focus on cognitive goals. Pt asleep upon entry but easy to waken, took a few minutes to clear cognitively as a result. SLP facilitated functional recall task by providing min A cues for 90% accuracy. Daughter and son in law present at end of tx sessions, discussed strategies to reduce risk of fall at home (using walker/wheelchair per PT recs, removing rugs as a tripping hazard, etc). Pt left in bed with alarm set and daughter present. Cont ST POC.   Pain Pain Assessment Pain Scale: 0-10 Pain Score: 5  Pain Type: Acute pain Pain Location: Hip Pain Orientation: Right Pain Descriptors / Indicators: Aching Pain Intervention(s): Medication (See eMAR)  Agitated Behavior Scale: TBI Observation Details Observation Environment: pt room Start of observation period - Date: 10/24/20 Start of observation period - Time: 1030 End of observation period - Date: 10/24/20 End of observation period - Time: 1100 Agitated Behavior Scale (DO NOT LEAVE BLANKS) Short attention span, easy distractibility, inability to concentrate: Absent Impulsive, impatient, low tolerance for pain or frustration: Absent Uncooperative, resistant to care, demanding: Absent Violent and/or threatening violence toward people or property: Absent Explosive and/or unpredictable anger: Absent Rocking, rubbing, moaning, or other self-stimulating behavior:  Absent Pulling at tubes, restraints, etc.: Absent Wandering from treatment areas: Absent Restlessness, pacing, excessive movement: Absent Repetitive behaviors, motor, and/or verbal: Absent Rapid, loud, or excessive talking: Absent Sudden changes of mood: Absent Easily initiated or excessive crying and/or laughter: Absent Self-abusiveness, physical and/or verbal: Absent Agitated behavior scale total score: 14  Therapy/Group: Individual Therapy  Tacey Ruiz 10/24/2020, 10:51 AM

## 2020-10-25 NOTE — Progress Notes (Signed)
Physical Therapy TBI Note  Patient Details  Name: Nathan Mitchell MRN: [id number] Date of Birth: Jan 09, 1931  Today's Date: 10/25/2020 PT Individual Time: 0905-1000 PT Individual Time Calculation (min): 55 min   Short Term Goals: Week 2:  PT Short Term Goal 1 (Week 2): Patient will perform basic transfers with CGA consistently using LRAD. PT Short Term Goal 2 (Week 2): Patient will ambulate >75 ft using LRAD with CGA and reciprocal gait pattern. PT Short Term Goal 3 (Week 2): patient will perform standing balance during a functional task x5 min with CGA using LRAD.  Skilled Therapeutic Interventions/Progress Updates:     Patient in bed upon PT arrival. Patient alert and agreeable to PT session. Patient reported 7/10 R hip pain during session, RN made aware. PT provided repositioning, rest breaks, and distraction as pain interventions throughout session.   Therapeutic Activity: Bed Mobility: Patient performed supine to/from sit with supervision and increased time for R lower extremity management. Provided verbal cues for initiating R hip adduction/abduction to bring his leg off/on the bed without assist. Transfers: Patient performed sit to/from stand x4 with CGA using RW. Provided verbal cues for hand placement x1, forward weight shift each trial due to posterior bias, and reaching back to sit x3.  Gait Training:  Patient ambulated 135 feet x2 and 18 feet weaving between evenly spaced orange cones followed by 24 feet to walk back to the w/c using RW with CGA. Ambulated with antalgic gait on R, reciprocal gait pattern with decreased L step length and R stance time, mild forward trunk lean, and increased upper extremity use. Provided verbal cues for erect posture, increased L step length to produce more even stepping pattern and increased tolerance for R weight shift.  Wheelchair Mobility:  Patient propelled wheelchair 45 feet with supervision. Provided verbal cues for use of breaks,  applied yellow [place] to breaks for improved visual stimulus for patient to locate the breaks due to vision deficits.   Neuromuscular Re-ed: Patient performed the following standing balance activities: -standing ball toss with bright yellow ball x5, caught ball 2/5 trials and maintained balance with CGA -standing to take medications from RN without upper extremity support to complete task with CGA for standing balance -standing kicking yellow ball with R/L x5 with B upper extremity support on RW, focused on increased R ROM and R stance time  Patient in bed at end of session with breaks locked, bed alarm set, and all needs within reach.   Therapy Documentation Precautions:  Precautions Precautions: Fall Precaution Comments: foley, T4 compression fx (no brace) Restrictions Weight Bearing Restrictions: Yes RLE Weight Bearing: Weight bearing as tolerated LLE Weight Bearing: Weight bearing as tolerated Agitated Behavior Scale: TBI  Observation Details Observation Environment: pt room Start of observation period - Date: 10/25/20 Start of observation period - Time: 0905 End of observation period - Date: 10/25/20 End of observation period - Time: 1000 Agitated Behavior Scale (DO NOT LEAVE BLANKS) Short attention span, easy distractibility, inability to concentrate: Absent Impulsive, impatient, low tolerance for pain or frustration: Absent Uncooperative, resistant to care, demanding: Absent Violent and/or threatening violence toward people or property: Absent Explosive and/or unpredictable anger: Absent Rocking, rubbing, moaning, or other self-stimulating behavior: Absent Pulling at tubes, restraints, etc.: Absent Wandering from treatment areas: Absent Restlessness, pacing, excessive movement: Absent Repetitive behaviors, motor, and/or verbal: Absent Rapid, loud, or excessive talking: Absent Sudden changes of mood: Absent Easily initiated or excessive crying and/or laughter:  Absent Self-abusiveness, physical and/or verbal: Absent  Agitated behavior scale total score: 14   Therapy/Group: Individual Therapy  Jacquelina Hewins L Tylerjames Hoglund PT, DPT  10/25/2020, 12:50 PM

## 2020-10-25 NOTE — Progress Notes (Signed)
Occupational Therapy TBI Note  Patient Details  Name: Nathan Mitchell MRN: [id number] Date of Birth: 07-Jun-1930  Today's Date: 10/25/2020 OT Individual Time: 1696-7893 OT Individual Time Calculation (min): 56 min    Short Term Goals: Week 2:  OT Short Term Goal 1 (Week 2): Pt will use LRAD to don pants with min A OT Short Term Goal 2 (Week 2): Pt will complete walk in shower transfers with min A and min cueing OT Short Term Goal 3 (Week 2): Pt will require no more than min cueing during shower level bathing for initiation and thoroughness  Skilled Therapeutic Interventions/Progress Updates:    Pt received supine with no c/o pain at rest. Pt agreeable to take shower. 850 cc emptied from foley catheter bag. Pt transferred to EOB with supervision. HR 100, SpO2 100% EOB. He completed sit >stand with good carryover of hand placement and CGA. He completed ambulatory transfer into the bathroom with CGA using the RW. CGA for toileting tasks and transfer to [facility] over toilet. Increase in pain with ambulation, rest breaks provided as needed. Pt required min A to doff LB clothing over feet and foley bag. He transferred into the walk in shower with min A. He completed UB bathing and dressing with supervision. Min A to stand for LB but he was able to complete all bathing without further assistance. LH sponge encouraged to wash feet which pt was able to do with min A. He returned to the w/c following and donned shirt with set up assist. Pt with SOB however all VSS. Attempted to have pt use reacher for LB dressing- he was unable to sequence use/with visual deficits and required min A to don. Pt returned to supine in bed and was left with all needs met, bed alarm set.   Therapy Documentation Precautions:  Precautions Precautions: Fall Precaution Comments: foley, T4 compression fx (no brace) Restrictions Weight Bearing Restrictions: Yes RLE Weight Bearing: Weight bearing as tolerated LLE Weight Bearing:  Weight bearing as tolerated Agitated Behavior Scale: TBI Observation Details Observation Environment: pt room Start of observation period - Date: 10/25/20 Start of observation period - Time: 1400 End of observation period - Date: 10/25/20 End of observation period - Time: 1500 Agitated Behavior Scale (DO NOT LEAVE BLANKS) Short attention span, easy distractibility, inability to concentrate: Absent Impulsive, impatient, low tolerance for pain or frustration: Absent Uncooperative, resistant to care, demanding: Absent Violent and/or threatening violence toward people or property: Absent Explosive and/or unpredictable anger: Absent Rocking, rubbing, moaning, or other self-stimulating behavior: Absent Pulling at tubes, restraints, etc.: Absent Wandering from treatment areas: Absent Restlessness, pacing, excessive movement: Absent Repetitive behaviors, motor, and/or verbal: Absent Rapid, loud, or excessive talking: Absent Sudden changes of mood: Absent Easily initiated or excessive crying and/or laughter: Absent Self-abusiveness, physical and/or verbal: Absent Agitated behavior scale total score: 14    Therapy/Group: Individual Therapy  Curtis Sites 10/25/2020, 2:25 PM

## 2020-10-25 NOTE — Progress Notes (Signed)
Speech Language Pathology TBI Note  Patient Details  Name: Nathan Mitchell MRN: [id number] Date of Birth: 03-11-1931  Today's Date: 10/25/2020 SLP Individual Time: 2706-2376 SLP Individual Time Calculation (min): 45 min  Short Term Goals: Week 2: SLP Short Term Goal 1 (Week 2): Patient will recall daily, functional information with occ verbal cues for use of strategies. SLP Short Term Goal 2 (Week 2): Patient will appropriately initiate and sequence during functional tasks and ADLs with Min A verbal cues  Skilled Therapeutic Interventions:  Pt was seen for skilled ST targeting cognitive goals.  Pt was awake, alert, and pleasantly interactive throughout treatment session although he verbalized feeling frustrated at therapists' recommendations which he perceived to be precluding his functional independence.  He specifically recalled therapists' recommendations that he not get up and walk by himself and that he use a reacher to pick items up off the ground to prevent bending.  Pt was also able to independently recall his upcoming discharge date.    Pt continued to present with decreased awareness of how his current injuries will impact his functional independence in the home environment despite extensive conversations with speech therapist, stating "I don't need to be trained, others need to be trained on how to best help me," specifically as it pertains to accommodating for his vision loss.  SLP provided encouragement and reassurance and reinforced rationale for CIR intervention.  Pt was left in bed with bed alarm set and call bell within reach.  Continue per current plan of care.    Pain Pain Assessment Pain Scale: 0-10 Pain Score: 0-No pain Faces Pain Scale: No hurt Pain Type: Acute pain Pain Location: Hip Pain Orientation: Right  Agitated Behavior Scale: TBI Observation Details Observation Environment: pt room Start of observation period - Date: 10/25/20 Start of observation period  - Time: 0800 End of observation period - Date: 10/25/20 End of observation period - Time: 0845 Agitated Behavior Scale (DO NOT LEAVE BLANKS) Short attention span, easy distractibility, inability to concentrate: Absent Impulsive, impatient, low tolerance for pain or frustration: Absent Uncooperative, resistant to care, demanding: Absent Violent and/or threatening violence toward people or property: Absent Explosive and/or unpredictable anger: Absent Rocking, rubbing, moaning, or other self-stimulating behavior: Absent Pulling at tubes, restraints, etc.: Absent Wandering from treatment areas: Absent Restlessness, pacing, excessive movement: Absent Repetitive behaviors, motor, and/or verbal: Absent Rapid, loud, or excessive talking: Absent Sudden changes of mood: Absent Easily initiated or excessive crying and/or laughter: Absent Self-abusiveness, physical and/or verbal: Absent Agitated behavior scale total score: 14  Therapy/Group: Individual Therapy  Courtnee Myer, Melanee Spry 10/25/2020, 12:53 PM

## 2020-10-26 DIAGNOSIS — M316 Other giant cell arteritis: Secondary | ICD-10-CM

## 2020-10-26 DIAGNOSIS — S069X3S Unspecified intracranial injury with loss of consciousness of 1 hour to 5 hours 59 minutes, sequela: Secondary | ICD-10-CM

## 2020-10-26 LAB — BASIC METABOLIC PANEL
Anion gap: 8 (ref 5–15)
BUN: 25 mg/dL — ABNORMAL HIGH (ref 8–23)
CO2: 26 mmol/L (ref 22–32)
Calcium: 8.8 mg/dL — ABNORMAL LOW (ref 8.9–10.3)
Chloride: 104 mmol/L (ref 98–111)
Creatinine, Ser: 1.13 mg/dL (ref 0.61–1.24)
GFR, Estimated: >60 mL/min (ref >60)
Glucose, Bld: 105 mg/dL — ABNORMAL HIGH (ref 70–99)
Potassium: 4.2 mmol/L (ref 3.5–5.1)
Sodium: 138 mmol/L (ref 135–145)

## 2020-10-26 LAB — CBC
HCT: 28.9 % — ABNORMAL LOW (ref 39.0–52.0)
Hemoglobin: 8.9 g/dL — ABNORMAL LOW (ref 13.0–17.0)
MCH: 29.5 pg (ref 26.0–34.0)
MCHC: 30.8 g/dL (ref 30.0–36.0)
MCV: 95.7 fL (ref 80.0–100.0)
Platelets: 336 10*3/uL (ref 150–400)
RBC: 3.02 MIL/uL — ABNORMAL LOW (ref 4.22–5.81)
RDW: 16.9 % — ABNORMAL HIGH (ref 11.5–15.5)
WBC: 6.5 10*3/uL (ref 4.0–10.5)
nRBC: 0 % (ref 0.0–0.2)

## 2020-10-26 NOTE — Progress Notes (Signed)
Subjective:    Patient reports pain as moderate.    Objective: Vital signs in last 24 hours: Temp:  [97.8 F (36.6 C)-98.1 F (36.7 C)] 98.1 F (36.7 C) (06/27 0439) Pulse Rate:  [84-89] 89 (06/27 0439) Resp:  [19-20] 19 (06/27 0439) BP: (120-133)/(60-79) 133/79 (06/27 0439) SpO2:  [97 %-99 %] 97 % (06/27 0439) Weight:  [78.6 kg] 78.6 kg (06/27 0500)  Intake/Output from previous day: 06/26 0701 - 06/27 0700 In: 840 [P.O.:840] Out: 2000 [Urine:2000] Intake/Output this shift: Total I/O In: 480 [P.O.:480] Out: 350 [Urine:350]  Recent Labs    10/26/20 0514  HGB 8.9*   Recent Labs    10/26/20 0514  WBC 6.5  RBC 3.02*  HCT 28.9*  PLT 336   Recent Labs    10/26/20 0514  NA 138  K 4.2  CL 104  CO2 26  BUN 25*  CREATININE 1.13  GLUCOSE 105*  CALCIUM 8.8*   No results for input(s): LABPT, INR in the last 72 hours.  Neurovascular intact Sensation intact distally Dorsiflexion/Plantar flexion intact Incision: dressing C/D/I No cellulitis present Compartment soft   Assessment/Plan:    Up with therapy WBAT RLE Xrays stable F/u with Dr. Roda Shutters in 4 weeks (6 weeks po)      Nathan Mitchell 10/26/2020, 9:43 AM

## 2020-10-26 NOTE — Progress Notes (Signed)
Occupational Therapy TBI Note  Patient Details  Name: Nathan Mitchell MRN: [id number] Date of Birth: 1930/11/21  Today's Date: 10/26/2020 OT Individual Time: 4193-7902 OT Individual Time Calculation (min): 60 min    Short Term Goals: Week 2:  OT Short Term Goal 1 (Week 2): Pt will use LRAD to don pants with min A OT Short Term Goal 2 (Week 2): Pt will complete walk in shower transfers with min A and min cueing OT Short Term Goal 3 (Week 2): Pt will require no more than min cueing during shower level bathing for initiation and thoroughness  Skilled Therapeutic Interventions/Progress Updates:    Pt received supine, 5/10 pain in his R hip with activity, rest breaks provided as needed. Pt requesting to use the bathroom and complete shower. Discussed home information sheet left by family. Pt completed ambulatory transfer into the bathroom with CGA, good RW management and sequencing. Toileting tasks completed with CGA overall. Transfer into walk in shower with min cueing and CGA. Pt completed bathing seated on TTB with set up assist, min A to stand from TTB to wash peri areas in standing. Pt completed transfer back to w/c with CGA using the RW. Oral care completed at the sink with set up assist. Shirt donned supervision. Increased time for problem solving use of reacher to don shorts- min A overall and mod-max cueing required. Pt was taken to the therapy gym to practice walk in shower transfer. He was given demonstration with mostly verbal cues d/t visual deficits and incorporated tactile cues for pt to ensure he is safely positioned. He completed the transfer with the posterior method x2 with CGA. Pt returned to his room and was left sitting up with all needs met, chair alarm set.   Therapy Documentation Precautions:  Precautions Precautions: Fall Precaution Comments: foley, T4 compression fx (no brace) Restrictions Weight Bearing Restrictions: Yes RLE Weight Bearing: Weight bearing as  tolerated LLE Weight Bearing: Weight bearing as tolerated    Agitated Behavior Scale: TBI Observation Details Observation Environment: pt room Start of observation period - Date: 10/26/20 Start of observation period - Time: 0830 End of observation period - Date: 10/26/20 End of observation period - Time: 0930 Agitated Behavior Scale (DO NOT LEAVE BLANKS) Short attention span, easy distractibility, inability to concentrate: Absent Impulsive, impatient, low tolerance for pain or frustration: Absent Uncooperative, resistant to care, demanding: Absent Violent and/or threatening violence toward people or property: Absent Explosive and/or unpredictable anger: Absent Rocking, rubbing, moaning, or other self-stimulating behavior: Absent Pulling at tubes, restraints, etc.: Absent Wandering from treatment areas: Absent Restlessness, pacing, excessive movement: Absent Repetitive behaviors, motor, and/or verbal: Absent Rapid, loud, or excessive talking: Absent Sudden changes of mood: Absent Easily initiated or excessive crying and/or laughter: Absent Self-abusiveness, physical and/or verbal: Absent Agitated behavior scale total score: 14    Therapy/Group: Individual Therapy  Curtis Sites 10/26/2020, 8:52 AM

## 2020-10-26 NOTE — Progress Notes (Signed)
PROGRESS NOTE   Subjective/Complaints: Pain under control. Up at sink brushing teeth. Anxious to get home  ROS: Patient denies fever, rash, sore throat, blurred vision, nausea, vomiting, diarrhea, cough, shortness of breath or chest pain,   headache, or mood change.    Objective:   No results found. Recent Labs    10/26/20 0514  WBC 6.5  HGB 8.9*  HCT 28.9*  PLT 336    Recent Labs    10/26/20 0514  NA 138  K 4.2  CL 104  CO2 26  GLUCOSE 105*  BUN 25*  CREATININE 1.13  CALCIUM 8.8*    Intake/Output Summary (Last 24 hours) at 10/26/2020 1233 Last data filed at 10/26/2020 3810 Gross per 24 hour  Intake 1080 ml  Output 2350 ml  Net -1270 ml        Physical Exam: Vital Signs Blood pressure 133/79, pulse 89, temperature 98.1 F (36.7 C), temperature source Oral, resp. rate 19, height 6\' 1"  (1.854 m), weight 78.6 kg, SpO2 97 %. Constitutional: No distress . Vital signs reviewed. HEENT: EOMI, oral membranes moist Neck: supple Cardiovascular: RRR without murmur. No JVD    Respiratory/Chest: CTA Bilaterally without wheezes or rales. Normal effort    GI/Abdomen: BS +, non-tender, non-distended Ext: no clubbing, cyanosis, or edema Psych: pleasant and cooperative Skin: intact  Neuro: reasonable insight, memory, and awareness. Speech clear. Normal language. Cranial nerves 2-12 are intact. Sensory exam is normal. Reflexes are 2+ in all 4's. Fine motor coordination is intact. No tremors. Motor function is grossly 5/5 in UE's. RLE 3/5 d/t pain prox to 4/5 distally. LLE 4-5/5 prox to distal.-improving  Musculoskeletal:  right hip tender with rom, edema decreased. Moving leg more freely  Assessment/Plan: 1. Functional deficits which require 3+ hours per day of interdisciplinary therapy in a comprehensive inpatient rehab setting. Physiatrist is providing close team supervision and 24 hour management of active medical  problems listed below. Physiatrist and rehab team continue to assess barriers to discharge/monitor patient progress toward functional and medical goals  Care Tool:  Bathing    Body parts bathed by patient: Right arm, Left arm, Chest, Abdomen, Front perineal area, Right upper leg, Left upper leg, Face, Left lower leg, Right lower leg   Body parts bathed by helper: Right lower leg, Left lower leg, Buttocks     Bathing assist Assist Level: Contact Guard/Touching assist     Upper Body Dressing/Undressing Upper body dressing   What is the patient wearing?: Pull over shirt    Upper body assist Assist Level: Supervision/Verbal cueing    Lower Body Dressing/Undressing Lower body dressing      What is the patient wearing?: Pants     Lower body assist Assist for lower body dressing: Minimal Assistance - Patient > 75%     Toileting Toileting    Toileting assist Assist for toileting: Minimal Assistance - Patient > 75%     Transfers Chair/bed transfer  Transfers assist     Chair/bed transfer assist level: Contact Guard/Touching assist Chair/bed transfer assistive device:   Ambulation assist      Assist level: Contact Guard/Touching assist Assistive device: Walker-rolling Max  distance: 135 ft   Walk 10 feet activity   Assist  Walk 10 feet activity did not occur: Safety/medical concerns (Limited by increased R hip pain)  Assist level: Contact Guard/Touching assist Assistive device: Walker-rolling   Walk 50 feet activity   Assist Walk 50 feet with 2 turns activity did not occur: Safety/medical concerns  Assist level: Contact Guard/Touching assist Assistive device: Walker-rolling    Walk 150 feet activity   Assist Walk 150 feet activity did not occur: Safety/medical concerns         Walk 10 feet on uneven surface  activity   Assist Walk 10 feet on uneven surfaces activity did not occur: Safety/medical  concerns         Wheelchair     Assist Will patient use wheelchair at discharge?: Yes Type of Wheelchair: Manual Wheelchair activity did not occur: Safety/medical concerns (Limited by increased R hip pain during evaluation)  Wheelchair assist level: Supervision/Verbal cueing Max wheelchair distance: 55 ft    Wheelchair 50 feet with 2 turns activity    Assist    Wheelchair 50 feet with 2 turns activity did not occur: Safety/medical concerns   Assist Level: Supervision/Verbal cueing   Wheelchair 150 feet activity     Assist  Wheelchair 150 feet activity did not occur: Safety/medical concerns   Assist Level: Contact Guard/Touching assist   Blood pressure 133/79, pulse 89, temperature 98.1 F (36.7 C), temperature source Oral, resp. rate 19, height 6\' 1"  (1.854 m), weight 78.6 kg, SpO2 97 %.        Medical Problem List and Plan: 1.  TBI with associated SDH. Also suffered right IT hip fracture, T4 compression fx             -patient may not shower             -ELOS: 7/6             -Continue CIR therapies including PT, OT, and SLP   2.  Impaired mobility -DVT/anticoagulation:  Pharmaceutical: Continue Lovenox bid, need to check with ortho how long he will need this so we can provide injection education if warranted.              -antiplatelet therapy: N/A 3. Right IT hip fracture: Schedule tylenol 650mg  TID. LFTs reviewed and wnl. Continue oxycodone/robaxin prn, discussed with team that he is not using much, can consider using prior to therapy to maximize participation.  4. Mood: LCSW to follow for evaluation and support.             -antipsychotic agents: N/A 5. Neuropsych: This patient is not fully capable of making decisions on his own behalf.             --cognition improving.   -can dc sleep chart    6. Skin/Wound Care: foam dressing to right hip incision-minimal to no drainage 7. Fluids/Electrolytes/Nutrition: Appetite has been good per wife. --  encourage PO as BUN/Cr elevated 6/27---recheck later in week 8. Right femur fracture: WBAT.              --wounds healing nicely  -removed staples   9. L-SDH: off Keprra since 6/16  no seizures            10 Giant cell arteritis with visual loss: Was on methotrexate weekly. --Spoke to Dr. 6/16---resumed MTX 6/24. T bili normalized. Folic acid changed back to 1mg  dose.  11. Nickola Major HR tid--incidental finding years ago and has been controlled.             --  To follow up with Card? PCP regarding d/c of Xarelto per notes. 12. Constipation: Continue miralax.  13. Urinary retention: Has failed one voiding trial by Dr. Cardell Peach.             --plans for ultrasound of bladder/prostate? 06/24?             --Continue Flomax at bedtime.  -RN changed foley 6/17---change again prior to dc? 14. Leucocytosis: Resolving without signs of infection             --encourage pulmonary toilet for likely Right atelectasis. 15. ABLA: Continue to monitor H/H with serial checks and monitor for signs of bleeding. --Monitor hematoma right hip./flank.             -6/15 hgb looks to be trending   up, 7.5 ---> 8.9 6/27  -Continue ferrous gluconate 324mg  MWF.  16. H/o COPD: Stable. Did not like trial of MDI per wife.  17. Vitamin B12 deficiency: Continue Vitamin B12 daily ordered 18. Hypotension: asymptomatic, continue to monitor. Medications reviewed and on nothing purely for HTN. Improving.  18. Hypoalbuminemia: Juven ordered with meals.   LOS: 13 days A FACE TO FACE EVALUATION WAS PERFORMED  10/26/2020, 12:33 PM

## 2020-10-26 NOTE — Progress Notes (Signed)
Speech Language Pathology TBI Note  Patient Details  Name: Nathan Mitchell MRN: [id number] Date of Birth: Sep 07, 1930  Today's Date: 10/26/2020 SLP Individual Time: 0720-0800 SLP Individual Time Calculation (min): 40 min  Short Term Goals: Week 2: SLP Short Term Goal 1 (Week 2): Patient will recall daily, functional information with occ verbal cues for use of strategies. SLP Short Term Goal 2 (Week 2): Patient will appropriately initiate and sequence during functional tasks and ADLs with Min A verbal cues  Skilled Therapeutic Interventions: Skilled treatment session focused on cognitive goals. Upon arrival, patient was consuming his breakfast independently. Patient was able to verbalize general goals of therapy but unable to verbalize specific goals or functions that therapy is targeting. SLP re-administered the [facility] and patient scored 19/22 points with a score of 18 or above considered normal. Patient's score has improved by 3 points since initial evaluation. Educated patient on results, he verbalized understanding. Patient left upright in bed with alarm on and all needs within reach. Continue with current plan of care.      Pain Pain Assessment Pain Scale: 0-10 Pain Score: 7  Faces Pain Scale: No hurt Pain Type: Acute pain Pain Location: Hip Pain Orientation: Right Pain Descriptors / Indicators: Aching Pain Frequency: Constant Pain Onset: On-going Patients Stated Pain Goal: 1 Pain Intervention(s): Medication (See eMAR) PAINAD (Pain Assessment in Advanced Dementia) Breathing: normal Negative Vocalization: none Facial Expression: smiling or inexpressive Body Language: relaxed  Agitated Behavior Scale: TBI Observation Details Observation Environment: Patient's room Start of observation period - Date: 10/26/20 Start of observation period - Time: 0720 End of observation period - Date: 10/26/20 End of observation period - Time: 0800 Agitated Behavior Scale (DO NOT  LEAVE BLANKS) Short attention span, easy distractibility, inability to concentrate: Absent Impulsive, impatient, low tolerance for pain or frustration: Absent Uncooperative, resistant to care, demanding: Absent Violent and/or threatening violence toward people or property: Absent Explosive and/or unpredictable anger: Absent Rocking, rubbing, moaning, or other self-stimulating behavior: Absent Pulling at tubes, restraints, etc.: Absent Wandering from treatment areas: Absent Restlessness, pacing, excessive movement: Absent Repetitive behaviors, motor, and/or verbal: Absent Rapid, loud, or excessive talking: Absent Sudden changes of mood: Absent Easily initiated or excessive crying and/or laughter: Absent Self-abusiveness, physical and/or verbal: Absent Agitated behavior scale total score: 14  Therapy/Group: Individual Therapy  Tarance Balan 10/26/2020, 3:38 PM

## 2020-10-26 NOTE — Discharge Instructions (Addendum)
  Inpatient Rehab Discharge Instructions  Nathan Mitchell Discharge date and time: 11/04/20   Activities/Precautions/ Functional Status: Activity: no lifting or strenuous exercise  till cleared by MD Diet: regular diet Wound Care: keep wound clean and dry. Contact Dr. Roda Shutters if you develop any problems with your incision/wound--redness, swelling, increase in pain, drainage or if you develop fever or chills.     Functional status:  ___ No restrictions     ___ Walk up steps independently _X__ 24/7 supervision/assistance   ___ Walk up steps with assistance ___ Intermittent supervision/assistance  ___ Bathe/dress independently ___ Walk with walker     _X__ Bathe/dress with assistance ___ Walk Independently    ___ Shower independently ___ Walk with assistance    ___ Shower with assistance _X__ No alcohol     ___ Return to work/school ________   Special Instructions: Continue your exercises 2-3 times a day Need to get up and walk once every couple of hours while awake.    COMMUNITY REFERRALS UPON DISCHARGE:    Home Health:   PT     OT        RN                    Agency:[facility]  [phone] *Please expect follow-up within 2-3 days of discharge to schedule your home visit. If you have not received follow-up, be sure to contact the site directly. *    Medical Equipment/Items Ordered:3in1 bedside commode and transport chair                                                  Agency/Supplier:[facility] [phone]   My questions have been answered and I understand these instructions. I will adhere to these goals and the provided educational materials after my discharge from the hospital.  Patient/Caregiver Signature _______________________________ Date __________  Clinician Signature _______________________________________ Date __________  Please bring this form and your medication list with you to all your follow-up doctor's appointments.

## 2020-10-26 NOTE — Progress Notes (Signed)
Physical Therapy TBI Note  Patient Details  Name: Nathan Mitchell MRN: [id number] Date of Birth: 08-Jul-1930  Today's Date: 10/26/2020 PT Individual Time: 1105-1120 and 1150-1302 PT Individual Time Calculation (min): 15 min and 72 min  Short Term Goals: Week 2:  PT Short Term Goal 1 (Week 2): Patient will perform basic transfers with CGA consistently using LRAD. PT Short Term Goal 2 (Week 2): Patient will ambulate >75 ft using LRAD with CGA and reciprocal gait pattern. PT Short Term Goal 3 (Week 2): patient will perform standing balance during a functional task x5 min with CGA using LRAD.  Skilled Therapeutic Interventions/Progress Updates:     Session 1: Patient in w/c with his granddaughter in the room upon PT arrival. Patient alert and agreeable to PT session. Patient reported 7/10 R hip pain during session, RN made aware. PT provided repositioning, rest breaks, and distraction as pain interventions throughout session.   Patient's granddaughter asked if PT could arrange for outdoor session this afternoon so the patient could see his Ivor Costa that is only visiting for a few days. Rearranged schedule to meeting at 1200 at the [facility] entrance for outdoor session today before his granddaughter and and great-grandson depart for [employer]. Patient's granddaughter left to return this afternoon and patient's wife arrived.   Therapeutic Activity: Bed Mobility: Patient performed sit to supine with min A for R lower extremity management due to increased hip pain in a flat bed without use of bed rails to simulate home set-up. Provided min cues for activation of R hip abductors to bring his leg out to the side onto the bed and when adjusting in bed. Transfers: Patient performed stand pivot w/c>bed using RW with CGA. Provided min verbal cues for hand placement due to poor recall initially.  Patient in bed due to increased R hip pain with is wife at bedside at end of session with breaks  locked, bed alarm set, and all needs within reach.   Session 2:  Patient in bed with his wife at bedside upon PT arrival. Patient alert and agreeable to PT session. Patient reported 5/10 R hip pain during session, RN made aware. PT provided repositioning, rest breaks, and distraction as pain interventions throughout session. Patient very eager to see his great-grandson.  Patient demonstrated appropriate social interactions with his family and noted to have positive therapeutic response to bing outdoors and seeing his great grandson during session. Patient tolerated sitting outside >30 min without increased pain.   Dicussed home set-up and modifications to reduce patient's fall risk at home with patient's wife. Patient's wife would prefer transport chair over manual w/c due to narrow doorways and ease of transport for their caregiver at d/c. She reports that they have secured a 24/7 caregiver and set-up hands-on training with her through the CSW. Educated on patient's progress and current level of assist. Educated on occasional confusion at night time or morning leading to safety concerns and need for supervision for redirection as needed. Patient's wife very impressed with the patient's progress and does not have concerns that their caregiver will be capable of providing the care the patient with need.   Therapeutic Activity: Bed Mobility: Patient performed supine to sit with supervision and sit to supine with min A for R lower extremity management. Provided verbal cues for as above. Donned B tennis shoes sitting EOB with total A for energy/time management.  Transfers: Patient performed stand pivot w/c<>bed using RW with CGA with good recall of hand placement  on both trials. He performed sit to/from stand x1 with close supervision for safety using RW.   Gait Training:  Patient ambulated >60 feet using RW with CGA and w/c follow due to decreased pain/activity tolerance on unlevel side-walk with his  grand-son by his side then running ahead asking patient to race him, patient demonstrated good safety awareness and maintained current gait speed. Ambulated with reciprocal gait pattern with mild antalgic gait on the R. Provided verbal cues for proximity to RW x1.  Wheelchair Mobility:  Patient was transported in the w/c with total A throughout session for energy conservation and time management. Patient tolerated his grandson sitting on his lap while being transported a short distance.   Patient in bed with his wife at bedside at end of session with breaks locked, bed alarm set, and all needs within reach.   Therapy Documentation Precautions:  Precautions Precautions: Fall Precaution Comments: foley, T4 compression fx (no brace) Restrictions Weight Bearing Restrictions: Yes RLE Weight Bearing: Weight bearing as tolerated LLE Weight Bearing: Weight bearing as tolerated Agitated Behavior Scale: TBI Observation Details Observation Environment: Patient's room Start of observation period - Date: 10/26/20 Start of observation period - Time: 1105 End of observation period - Date: 10/26/20 End of observation period - Time: 1120 Agitated Behavior Scale (DO NOT LEAVE BLANKS) Short attention span, easy distractibility, inability to concentrate: Absent Impulsive, impatient, low tolerance for pain or frustration: Absent Uncooperative, resistant to care, demanding: Absent Violent and/or threatening violence toward people or property: Absent Explosive and/or unpredictable anger: Absent Rocking, rubbing, moaning, or other self-stimulating behavior: Absent Pulling at tubes, restraints, etc.: Absent Wandering from treatment areas: Absent Restlessness, pacing, excessive movement: Absent Repetitive behaviors, motor, and/or verbal: Absent Rapid, loud, or excessive talking: Absent Sudden changes of mood: Absent Easily initiated or excessive crying and/or laughter: Absent Self-abusiveness, physical  and/or verbal: Absent Agitated behavior scale total score: 14 Agitated Behavior Scale: TBI Observation Details Observation Environment: Patient's room Start of observation period - Date: 10/26/20 Start of observation period - Time: 1150 End of observation period - Date: 10/26/20 End of observation period - Time: 1302 Agitated Behavior Scale (DO NOT LEAVE BLANKS) Short attention span, easy distractibility, inability to concentrate: Absent Impulsive, impatient, low tolerance for pain or frustration: Absent Uncooperative, resistant to care, demanding: Absent Violent and/or threatening violence toward people or property: Absent Explosive and/or unpredictable anger: Absent Rocking, rubbing, moaning, or other self-stimulating behavior: Absent Pulling at tubes, restraints, etc.: Absent Wandering from treatment areas: Absent Restlessness, pacing, excessive movement: Absent Repetitive behaviors, motor, and/or verbal: Absent Rapid, loud, or excessive talking: Absent Sudden changes of mood: Absent Easily initiated or excessive crying and/or laughter: Absent Self-abusiveness, physical and/or verbal: Absent Agitated behavior scale total score: 14   Therapy/Group: Individual Therapy  Landrey Mahurin L Mckinleigh Schuchart PT, DPT  10/26/2020, 4:46 PM

## 2020-10-27 NOTE — Progress Notes (Signed)
Patient ID: Nathan Mitchell, male   DOB: [birth year], 87 y.o.   MRN: [id number]  SW met with pt in room at bedside to provide udpates on gains made in rehab and able to move his d/c date to 7/2. SW called pt wife [name] [phone]) and dtr [name] [phone]) to inform on above updates. The family was prepared for pt to leave next week as all the children will be out of town, and they are also unable to get the 24/7 aide service to begin before Wednesday since this is the schedule date. His wife states she will make efforts to see if there are any other options but it is unlikely. SW to email HHA list to her dtr [name] ([email or web address]).  SW received updates from OT preferred H[facility] is [facility]now [facility] SW sent HHPT/OT referral to [facility]. SW emailed HHA list to pt dtr in the event the HHA is not able to accept.   Loralee Pacas, MSW, [place] Office: [phone] Cell: [phone] Fax: [phone]

## 2020-10-27 NOTE — Progress Notes (Signed)
Physical Therapy Session Note  Patient Details  Name: Nathan Mitchell MRN: [id number] Date of Birth: 01/22/1931  Today's Date: 10/27/2020 PT Individual Time: 1030-1100 PT Individual Time Calculation (min): 30 min   Short Term Goals: Week 2:  PT Short Term Goal 1 (Week 2): Patient will perform basic transfers with CGA consistently using LRAD. PT Short Term Goal 2 (Week 2): Patient will ambulate >75 ft using LRAD with CGA and reciprocal gait pattern. PT Short Term Goal 3 (Week 2): patient will perform standing balance during a functional task x5 min with CGA using LRAD.  Skilled Therapeutic Interventions/Progress Updates:     Patient in w/c in the room upon PT arrival. Patient alert and agreeable to PT session. Patient reported 7/10 R hip pain at rest and "42"/10 R hip pain after gait training during session, RN made aware and provided pain medication during session. PT provided repositioning, rest breaks, and distraction as pain interventions throughout session.   Applied Kinesiotape over R anterior tibialis and R gluteal medius using structural support technique with the aim of reduced pain with muscle activation. Also applied Kinesiotape over R lateral thigh using lymphatic drainage technique with the aim of edema control. Cleaned patient's skin prior to application. Instructed patient that the tape can be left on up to 3 days and can be worn in the shower. Instructed to removed the tape if peeling off, signs of skin irritation arise, or it has been on for >3 days. Informed patient that the tape is best removed in the shower or with a wet wash cloth. Patient stated understanding. Rehab team informed about tape placement to assist with monitoring patient's response.  Therapeutic Activity: Bed Mobility: Patient performed sit to supine with min A for R lower extremity management due to increased pain in a flat elevated bed without use of bed rails for simulation of home set-up.  Transfers:  Patient performed sit to/from stand x2 with close supervision using RW. Provided verbal cues for reaching back to sit x1, otherwise the patient was able to teach back hand placement on RW prior to first transfer.  Gait Training:  Patient ambulated 40 feet and 26 feet using RW with CGA-close supervision. Ambulated with reciprocal gait pattern with antalgic gait on R, improved on second trial following kinesiotape application. Provided verbal cues for erect posture and safety with AD management around obstacles in the room.  Patient in bed handed off to [place], PT at end of session.  Therapy Documentation Precautions:  Precautions Precautions: Fall Precaution Comments: foley, T4 compression fx (no brace) Restrictions Weight Bearing Restrictions: No RLE Weight Bearing: Weight bearing as tolerated LLE Weight Bearing: Weight bearing as tolerated  Agitated Behavior Scale: TBI Observation Details Observation Environment: Patient's room Start of observation period - Date: 10/27/20 Start of observation period - Time: 1030 End of observation period - Date: 10/27/20 End of observation period - Time: 1100 Agitated Behavior Scale (DO NOT LEAVE BLANKS) Short attention span, easy distractibility, inability to concentrate: Absent Impulsive, impatient, low tolerance for pain or frustration: Absent Uncooperative, resistant to care, demanding: Absent Violent and/or threatening violence toward people or property: Absent Explosive and/or unpredictable anger: Absent Rocking, rubbing, moaning, or other self-stimulating behavior: Absent Pulling at tubes, restraints, etc.: Absent Wandering from treatment areas: Absent Restlessness, pacing, excessive movement: Absent Repetitive behaviors, motor, and/or verbal: Absent Rapid, loud, or excessive talking: Absent Sudden changes of mood: Absent Easily initiated or excessive crying and/or laughter: Absent Self-abusiveness, physical and/or  verbal  Therapy/Group: Individual Therapy  Kenlea Woodell L Sandeep Delagarza PT, DPT  10/27/2020, 4:34 PM

## 2020-10-27 NOTE — Progress Notes (Signed)
Pt positioned on left side for suture removal. 3 incision sites observed.   4 sutures, 1 adhesive strip removed from superior r hip incision site. Clean, dry, intact, warm touch, pink   4 sutures, 1 adhesive strip removed from inferior r hip incision site. Clean, dry, intact  2 sutures removed from medial r hip incision site. Clean, dry, intact   Pt tolerated well

## 2020-10-27 NOTE — Progress Notes (Signed)
Occupational Therapy TBI Note  Patient Details  Name: Nathan Mitchell MRN: [id number] Date of Birth: 12-21-1930  Today's Date: 10/27/2020 OT Individual Time: 4388-8757 OT Individual Time Calculation (min): 70 min   Session 2: OT Individual Time: 1440-1500 OT Individual Time Calculation (min): 20 min    Short Term Goals: Week 2:  OT Short Term Goal 1 (Week 2): Pt will use LRAD to don pants with min A OT Short Term Goal 2 (Week 2): Pt will complete walk in shower transfers with min A and min cueing OT Short Term Goal 3 (Week 2): Pt will require no more than min cueing during shower level bathing for initiation and thoroughness   Skilled Therapeutic Interventions/Progress Updates:    Pt received supine, 5/10 pain in his hip with activity but none at rest. Pt agreeable to take shower. Ambulatory transfer into the bathroom with min guiding cues and CGA overall. Discussed home set up and shower accessibility. Pt completed UB bathing and dressing with set up assist. CGA for standing level peri cleansing. Pt requires guiding cues and min A to load toothpaste on toothbrush. Discussed strategies to do oral care set up more independently. Increased time spent on continuing to trial/problem solve through LB dressing. Pt required heavy cueing for sequencing use of reacher and hemi dressing technique with R hip ROM deficits. Min A overall to don. Pt was taken to the therapy gym via w/c for time management. He completed standing level BUE forward reaching to challenge standing balance and BUE strengthening. Min- CGA for standing balance support/guarding. He returned to his room and was left sitting up with all needs met, chair alarm belt fastened.   Session 2: Session focused on discharge planning with pt and his wife Nathan Mitchell ,via telephone. Discussed equipment recommendations for the shower and then provided strategies for safety with pt's choice of a BSC in the shower. Also discussed min A LB dressing  goal downgrade and pt's wife was agreeable to providing this level of assist. Pt was left supine, NT entering to assist with toileting tasks.    Therapy Documentation Precautions:  Precautions Precautions: Fall Precaution Comments: foley, T4 compression fx (no brace) Restrictions Weight Bearing Restrictions: No RLE Weight Bearing: Weight bearing as tolerated LLE Weight Bearing: Weight bearing as tolerated  ABS: 14   Therapy/Group: Individual Therapy  Curtis Sites 10/27/2020, 6:30 AM

## 2020-10-27 NOTE — Progress Notes (Signed)
Physical Therapy TBI Note  Patient Details  Name: Nathan Mitchell MRN: [id number] Date of Birth: 1931/01/05  Today's Date: 10/27/2020 PT Individual Time: 1100-1130 PT Individual Time Calculation (min): 30 min   Short Term Goals: Week 2:  PT Short Term Goal 1 (Week 2): Patient will perform basic transfers with CGA consistently using LRAD. PT Short Term Goal 2 (Week 2): Patient will ambulate >75 ft using LRAD with CGA and reciprocal gait pattern. PT Short Term Goal 3 (Week 2): patient will perform standing balance during a functional task x5 min with CGA using LRAD.  Skilled Therapeutic Interventions/Progress Updates:  Patient supine in bed on entrance to room. PT from previous session present and completing application of Ktape for swelling mgmt to pt's R thigh. Patient alert and agreeable to PT session. Patient denied pain during session.  Therapeutic Exercise: Patient performed the following joint mobility movements AROM with vc/ tc for proper technique. Light overpressures applied for hip end ranges within pain limits.  - Hip/ knee to 90/ 90 light hold and min resistance applied into extension - In 90/90, AROM IR/ ER - SAQ with quad set hold in max ext and slow eccentric return - ABD/ ADD with light resistance  - SLR AAROM with focus on slow eccentric return  Patient remains supine  in bed at end of session with brakes locked, bed alarm set, and all needs within reach. Pt informed as to location of items on tray table to his L side.     Therapy Documentation Precautions:  Precautions Precautions: Fall Precaution Comments: foley, T4 compression fx (no brace) Restrictions Weight Bearing Restrictions: No RLE Weight Bearing: Weight bearing as tolerated LLE Weight Bearing: Weight bearing as tolerated  Agitated Behavior Scale: TBI Observation Details Observation Environment: Patient's room Start of observation period - Date: 10/27/20 Start of observation period - Time:  1100 End of observation period - Date: 10/27/20 End of observation period - Time: 1130 Agitated Behavior Scale (DO NOT LEAVE BLANKS) Short attention span, easy distractibility, inability to concentrate: Absent Impulsive, impatient, low tolerance for pain or frustration: Absent Uncooperative, resistant to care, demanding: Absent Violent and/or threatening violence toward people or property: Absent Explosive and/or unpredictable anger: Absent Rocking, rubbing, moaning, or other self-stimulating behavior: Absent Pulling at tubes, restraints, etc.: Absent Wandering from treatment areas: Absent Restlessness, pacing, excessive movement: Absent Repetitive behaviors, motor, and/or verbal: Absent Rapid, loud, or excessive talking: Absent Sudden changes of mood: Absent Easily initiated or excessive crying and/or laughter: Absent Self-abusiveness, physical and/or verbal: Absent Agitated behavior scale total score: 14   Therapy/Group: Individual Therapy  Loel Dubonnet PT, DPT 10/27/2020, 6:02 PM

## 2020-10-27 NOTE — Progress Notes (Signed)
Pt r hip incisions healing well. No drainage x 2 days. Dressing only require changing once daily instead of BID

## 2020-10-27 NOTE — Progress Notes (Signed)
PROGRESS NOTE   Subjective/Complaints: Pt resting comfortably in bed. Says pain is controlled. No problems overnight  ROS: Patient denies fever, rash, sore throat, blurred vision, nausea, vomiting, diarrhea, cough, shortness of breath or chest pain,   headache, or mood change.    Objective:   No results found. Recent Labs    10/26/20 0514  WBC 6.5  HGB 8.9*  HCT 28.9*  PLT 336    Recent Labs    10/26/20 0514  NA 138  K 4.2  CL 104  CO2 26  GLUCOSE 105*  BUN 25*  CREATININE 1.13  CALCIUM 8.8*    Intake/Output Summary (Last 24 hours) at 10/27/2020 1254 Last data filed at 10/27/2020 0900 Gross per 24 hour  Intake 720 ml  Output 3100 ml  Net -2380 ml        Physical Exam: Vital Signs Blood pressure 110/67, pulse 77, temperature 97.6 F (36.4 C), resp. rate 17, height 6\' 1"  (1.854 m), weight 76.9 kg, SpO2 97 %. Constitutional: No distress . Vital signs reviewed. HEENT: EOMI, oral membranes moist Neck: supple Cardiovascular: RRR without murmur. No JVD    Respiratory/Chest: CTA Bilaterally without wheezes or rales. Normal effort    GI/Abdomen: BS +, non-tender, non-distended Ext: no clubbing, cyanosis, or edema Psych: pleasant and cooperative Skin: intact, right hip with sutures, dry  Neuro: reasonable insight, memory, and awareness. Speech clear. Normal language. Cranial nerves 2-12 are intact. Sensory exam is normal. Reflexes are 2+ in all 4's. Fine motor coordination is intact. No tremors. Motor function is grossly 5/5 in UE's. RLE 3/5 d/t pain prox to 4/5 distally. LLE 4-5/5 prox to distal.-improving  Musculoskeletal:  right hip tender with rom, edema decreased. Moving leg more freely now  Assessment/Plan: 1. Functional deficits which require 3+ hours per day of interdisciplinary therapy in a comprehensive inpatient rehab setting. Physiatrist is providing close team supervision and 24 hour management of  active medical problems listed below. Physiatrist and rehab team continue to assess barriers to discharge/monitor patient progress toward functional and medical goals  Care Tool:  Bathing    Body parts bathed by patient: Right arm, Left arm, Chest, Abdomen, Front perineal area, Right upper leg, Left upper leg, Face, Left lower leg, Right lower leg, Buttocks   Body parts bathed by helper: Right lower leg, Left lower leg, Buttocks     Bathing assist Assist Level: Contact Guard/Touching assist     Upper Body Dressing/Undressing Upper body dressing   What is the patient wearing?: Pull over shirt    Upper body assist Assist Level: Supervision/Verbal cueing    Lower Body Dressing/Undressing Lower body dressing      What is the patient wearing?: Pants     Lower body assist Assist for lower body dressing: Minimal Assistance - Patient > 75%     Toileting Toileting    Toileting assist Assist for toileting: Minimal Assistance - Patient > 75%     Transfers Chair/bed transfer  Transfers assist     Chair/bed transfer assist level: Contact Guard/Touching assist Chair/bed transfer assistive device:   Ambulation assist      Assist level: Contact Guard/Touching assist  Assistive device: Walker-rolling Max distance: 135 ft   Walk 10 feet activity   Assist  Walk 10 feet activity did not occur: Safety/medical concerns (Limited by increased R hip pain)  Assist level: Contact Guard/Touching assist Assistive device: Walker-rolling   Walk 50 feet activity   Assist Walk 50 feet with 2 turns activity did not occur: Safety/medical concerns  Assist level: Contact Guard/Touching assist Assistive device: Walker-rolling    Walk 150 feet activity   Assist Walk 150 feet activity did not occur: Safety/medical concerns         Walk 10 feet on uneven surface  activity   Assist Walk 10 feet on uneven surfaces activity did not occur:  Safety/medical concerns         Wheelchair     Assist Will patient use wheelchair at discharge?: Yes Type of Wheelchair: Manual Wheelchair activity did not occur: Safety/medical concerns (Limited by increased R hip pain during evaluation)  Wheelchair assist level: Supervision/Verbal cueing Max wheelchair distance: 55 ft    Wheelchair 50 feet with 2 turns activity    Assist    Wheelchair 50 feet with 2 turns activity did not occur: Safety/medical concerns   Assist Level: Supervision/Verbal cueing   Wheelchair 150 feet activity     Assist  Wheelchair 150 feet activity did not occur: Safety/medical concerns   Assist Level: Contact Guard/Touching assist   Blood pressure 110/67, pulse 77, temperature 97.6 F (36.4 C), resp. rate 17, height 6\' 1"  (1.854 m), weight 76.9 kg, SpO2 97 %.        Medical Problem List and Plan: 1.  TBI with associated SDH. Also suffered right IT hip fracture, T4 compression fx             -patient may not shower             -ELOS: 7/6             -Continue CIR therapies including PT, OT, and SLP -team conference today   2.  Impaired mobility -DVT/anticoagulation:  Pharmaceutical: Continue Lovenox bid, need to check with ortho how long he will need this so we can provide injection education if warranted.              -antiplatelet therapy: N/A 3. Right IT hip fracture: Schedule tylenol 650mg  TID. LFTs reviewed and wnl. Continue oxycodone/robaxin prn, discussed with team that he is not using much, can consider using prior to therapy to maximize participation.  4. Mood: LCSW to follow for evaluation and support.             -antipsychotic agents: N/A 5. Neuropsych: This patient is not fully capable of making decisions on his own behalf.             --cognition improving. Probably close to baseline    6. Skin/Wound Care: foam dressing to right hip incision-minimal to no drainage 7. Fluids/Electrolytes/Nutrition: Appetite has been good  per wife. -- encourage PO as BUN/Cr elevated 6/27 -recheck Thursday -protein supp for low albumin 8. Right femur fracture: WBAT.              --wounds healing nicely  -removed staples, dc sutures too   9. L-SDH: off Keprra since 6/16  no seizures            10 Giant cell arteritis with visual loss: Was on methotrexate weekly. --Spoke to Dr. Thursday 6/16---resumed MTX 6/24. T bili normalized. Folic acid changed back to 1mg  dose.  11. Nickola Major HR  tid--incidental finding years ago and has been controlled.             --To follow up with Card? PCP regarding d/c of Xarelto per notes. 12. Constipation: Continue miralax.  13. Urinary retention: Has failed one voiding trial by Dr. Cardell Peach.             --plans for ultrasound of bladder/prostate? 06/24?             --Continue Flomax at bedtime.  -RN changed foley 6/17---change again prior to dc? 14. Leucocytosis: Resolving without signs of infection             --encourage pulmonary toilet for likely Right atelectasis. 15. ABLA: Continue to monitor H/H with serial checks and monitor for signs of bleeding. --Monitor hematoma right hip./flank.             -6/15 hgb looks to be trending   up, 7.5 ---> 8.9 6/27  -Continue ferrous gluconate 324mg  MWF.  16. H/o COPD: Stable. Did not like trial of MDI per wife.  17. Vitamin B12 deficiency: Continue Vitamin B12 daily ordered 18. Hypotension: bp's soft but asymptomatic   LOS: 14 days A FACE TO FACE EVALUATION WAS PERFORMED  10/27/2020, 12:54 PM

## 2020-10-27 NOTE — Patient Care Conference (Signed)
Inpatient RehabilitationTeam Conference and Plan of Care Update Date: 10/27/2020   Time: 10:24 AM    Patient Name: Nathan Mitchell      Medical Record Number: [id number]  Date of Birth: 29-Jun-1930 Sex: Male         Room/Bed: 4W24C/4W24C-01 Payor Info: Payor: [employer] MEDICARE / Plan: [employer] MEDICARE CHOICE PPO / Product Type: *No Product type* /    Admit Date/Time:  10/13/2020  4:33 PM  Primary Diagnosis:  TBI (traumatic brain injury) [facility])  Hospital Problems: Principal Problem:   TBI (traumatic brain injury) [facility])    Expected Discharge Date: Expected Discharge Date: 11/04/20  Team Members Present: Physician leading conference: Dr. Faith Rogue Care Coodinator Present: Cecile Sheerer, LCSWA;Daysean Tinkham Marlyne Beards, RN, BSN, CRRN Nurse Present: Kennyth Arnold, RN PT Present: Serina Cowper, PT OT Present: Jake Shark, OT SLP Present: Feliberto Gottron, SLP PPS Coordinator present : Fae Pippin, SLP     Current Status/Progress Goal Weekly Team Focus  Bowel/Bladder   patient has foley, continent of bowel  Patient will be able to urinate without bladder retention, completely emptying without discomfort      Swallow/Nutrition/ Hydration             ADL's   min A toileting tasks and LB dressing, S UB ADLs, CGA ADL transfers  supervision overall  ADLs, family edu, d/c planning, transfers, pain management   Mobility   CGA overall, gait 135 ft with RW  Supervision overall  ROM, activity tolerance, functional mobility, gait training, w/c mobility, patient/caregiver education   Communication             Safety/Cognition/ Behavioral Observations  Sup-to-Min - may be near baseline?  Sup  short-term recall, awareness of deficits, sequencing   Pain   5-7/10 pain, controlled with prn medication  pain < or = 3 on pain scale, pain is controlled  assess q 4hr and prn   Skin   surgical right hip incision healing well, scalp incision intact.  Patient will remain free of infection   assess q shift and prn     Discharge Planning:  D/c tohome with 24/7 care from his wife. Dtrs to assist as needed, and willing to hire support if needed. Fam edu 7/1 10am-12pm/1pm-3pm.   Team Discussion: Generalized back pain. Discontinue sutures today. Continent of bowel, chronic foley in place and education on going. Pain is controlled. Family education is this Friday. Patient on target to meet rehab goals: yes, min assist for toileting. Min assist lower body dressing, contact guard to supervision for ADL's. Contact guard to supervision for transfers, contact guard ambulating > 150 ft. Have hired someone 24/7 to assist in the home. SLP reports that short term memory has improved and appears to be close to baseline.   *See Care Plan and progress notes for long and short-term goals.   Revisions to Treatment Plan:  Continuing foley per urology.  Teaching Needs: Family education, medication management, pain management, foley care, skin/wound care, transfer training, gait training, balance training, endurance training, safety awareness.  Current Barriers to Discharge: Decreased caregiver support, Medical stability, Home enviroment access/layout, Wound care, Lack of/limited family support, Weight bearing restrictions, Medication compliance, and Behavior  Possible Resolutions to Barriers: Continue current medications, provide emotional support.     Medical Summary Current Status: continued foley per urology. pain levels improved. eating better.  Barriers to Discharge: Medical stability   Possible Resolutions to [employer] Focus: daily assessment of labs and pt data. wound care, pain mgt  Continued Need for Acute Rehabilitation Level of Care: The patient requires daily medical management by a physician with specialized training in physical medicine and rehabilitation for the following reasons: Direction of a multidisciplinary physical rehabilitation program to maximize functional  independence : Yes Medical management of patient stability for increased activity during participation in an intensive rehabilitation regime.: Yes Analysis of laboratory values and/or radiology reports with any subsequent need for medication adjustment and/or medical intervention. : Yes   I attest that I was present, lead the team conference, and concur with the assessment and plan of the team.   Tennis Must 10/27/2020, 3:53 PM

## 2020-10-27 NOTE — Progress Notes (Signed)
Speech Language Pathology TBI Note  Patient Details  Name: Nathan Mitchell MRN: [id number] Date of Birth: [birth year]  Today's Date: 10/27/2020 SLP Individual Time: 1330-1355 SLP Individual Time Calculation (min): 25 min  Short Term Goals: Week 2: SLP Short Term Goal 1 (Week 2): Patient will recall daily, functional information with occ verbal cues for use of strategies. SLP Short Term Goal 2 (Week 2): Patient will appropriately initiate and sequence during functional tasks and ADLs with Min A verbal cues  Skilled Therapeutic Interventions: Skilled treatment session focused on cognitive goals. Patient independently recalled events from previous therapy sessions and conversation with family regarding discharge. SLP called patient's wife with patient present and provided education regarding his current cognitive functioning and progress. Patient's wife verbalized agreement but reported continued confusion noted when patient taking pain medication and requested continued SLP intervention. SLP will continue to focus on functional tasks that will improve independence at home like use of reacher, etc. Patient verbalized understanding and agreement. Patient left upright in bed with alarm on and all needs within reach. Continue with current plan of care.      Pain Pain Assessment Pain Score: 4  Pain Type: Acute pain Pain Location: Knee Pain Orientation: Right  Agitated Behavior Scale: TBI Observation Details Observation Environment: Patient's room Start of observation period - Date: 10/27/20 Start of observation period - Time: 1330 End of observation period - Date: 10/27/20 End of observation period - Time: 1400 Agitated Behavior Scale (DO NOT LEAVE BLANKS) Short attention span, easy distractibility, inability to concentrate: Absent Impulsive, impatient, low tolerance for pain or frustration: Absent Uncooperative, resistant to care, demanding: Absent Violent and/or threatening violence  toward people or property: Absent Explosive and/or unpredictable anger: Absent Rocking, rubbing, moaning, or other self-stimulating behavior: Absent Pulling at tubes, restraints, etc.: Absent Wandering from treatment areas: Absent Restlessness, pacing, excessive movement: Absent Repetitive behaviors, motor, and/or verbal: Absent Rapid, loud, or excessive talking: Absent Sudden changes of mood: Absent Easily initiated or excessive crying and/or laughter: Absent Self-abusiveness, physical and/or verbal: Absent Agitated behavior scale total score: 14  Therapy/Group: Individual Therapy  Dakoda Bassette 10/27/2020, 3:35 PM

## 2020-10-28 NOTE — Progress Notes (Signed)
Occupational Therapy Weekly Progress Note  Patient Details  Name: Nathan Mitchell MRN: [id number] Date of Birth: 1930-11-19  Beginning of progress report period: October 21, 2020 End of progress report period: October 28, 2020  Today's Date: 10/28/2020 OT Individual Time: 6389-3734 OT Individual Time Calculation (min): 58 min    Patient has met 3 of 3 short term goals.  Pt is making steady progress towards goals except LB dressing. Pt continues to require up to MAX A for LB dressing d/t visual deficits/hip pain impacting reacher use. Pt is S-CGA for all functional transfers and would benefit from hands on family training in prep for DC  Patient continues to demonstrate the following deficits: muscle weakness, decreased cardiorespiratoy endurance, impaired timing and sequencing, unbalanced muscle activation, and decreased coordination, decreased visual acuity and decreased visual perceptual skills, decreased motor planning, decreased awareness, decreased problem solving, decreased safety awareness, and decreased memory, and decreased sitting balance, decreased standing balance, decreased postural control, and decreased balance strategies and therefore will continue to benefit from skilled OT intervention to enhance overall performance with BADL and iADL.  Patient not progressing toward long term goals.  See goal revision..  Plan of care revisions: MIN A LB dressing; CGA toileting and toilet transfers.  OT Short Term Goals Week 3:  OT Short Term Goal 1 (Week 3): STG=LTG d/t ELOS  Skilled Therapeutic Interventions/Progress Updates:    1:1. Pt received in bed agreeable to OT. Pt requires increased time to arouse from dep sleep but eventually pt requesting to shower. No need to toilet this date. Pt completes all mobility with CGA-S this date with RW at STS and ambulatory level with no VC for proximity to RW. Pt completes bathring with S overall using LHSS and MIN cuing for recall use of AE to wash  feet and back. Pt reporting heat from water helping with unrated hip pain. Pt completes UB dressing with set up and LB dressing with MIN A to thread second LE only with reacher after applying colorful coban for visual aide. Exited session with pt seated in bed, exit alarm on and call light in reach   Therapy Documentation Precautions:  Precautions Precautions: Fall Precaution Comments: foley, T4 compression fx (no brace) Restrictions Weight Bearing Restrictions: No RLE Weight Bearing: Weight bearing as tolerated LLE Weight Bearing: Weight bearing as tolerated General:   Vital Signs: Therapy Vitals Temp: 97.8 F (36.6 C) Temp Source: Oral Pulse Rate: 75 Resp: 20 BP: 135/77 Patient Position (if appropriate): Lying Oxygen Therapy SpO2: (!) 89 % O2 Device: Room Air Pain:   ADL: ADL Eating: Supervision/safety Where Assessed-Eating: Chair Grooming: Minimal assistance Upper Body Bathing: Supervision/safety Where Assessed-Upper Body Bathing: Chair Lower Body Bathing: Maximal assistance Where Assessed-Lower Body Bathing: Chair Upper Body Dressing: Contact guard Where Assessed-Upper Body Dressing: Chair Lower Body Dressing: Dependent Where Assessed-Lower Body Dressing: Chair Toileting: Dependent Where Assessed-Toileting: Bedside Commode Toilet Transfer: Dependent Toilet Transfer Method:  (stedy- MOD A power up) Vision   Perception    Praxis   Exercises:   Other Treatments:     Therapy/Group: Individual Therapy  Tonny Branch 10/28/2020, 6:53 AM

## 2020-10-28 NOTE — Progress Notes (Signed)
Foley cath placed 10/16/20. Pt spouse requested foley cath change. Pt spouse educated on foley cath change every 30 days and change is not due. Pt spouse requests cath changed before discharge. Charge nurse updated

## 2020-10-28 NOTE — Progress Notes (Signed)
Physical Therapy Weekly Progress Note  Patient Details  Name: Nathan Mitchell MRN: [id number] Date of Birth: [birth year]  Beginning of progress report period: October 21, 2020 End of progress report period: October 28, 2020  Today's Date: 10/28/2020 PT Individual Time: 1110-1205 PT Individual Time Calculation (min): 55 min   Patient has met 3 of 3 short term goals.  Patient has demonstrated steady progress this week with improved R hip pain management and tolerance. Patient currently performs bed mobility with supervision-min A for R lower extremity management, transfers with CGA progressing to intermittent supervision with RW and mod cues for safety, dynamic standing balance with CGA up to 5 min, and ambulating up to 150 ft using RW.  Patient continues to demonstrate the following deficits muscle weakness and muscle joint tightness, decreased cardiorespiratoy endurance, decreased visual acuity, decreased memory, and decreased standing balance, decreased postural control, and decreased balance strategies and therefore will continue to benefit from skilled PT intervention to increase functional independence with mobility.  Patient progressing toward long term goals..  Continue plan of care.  PT Short Term Goals Week 2:  PT Short Term Goal 1 (Week 2): Patient will perform basic transfers with CGA consistently using LRAD. PT Short Term Goal 1 - Progress (Week 2): Met PT Short Term Goal 2 (Week 2): Patient will ambulate >75 ft using LRAD with CGA and reciprocal gait pattern. PT Short Term Goal 2 - Progress (Week 2): Met PT Short Term Goal 3 (Week 2): patient will perform standing balance during a functional task x5 min with CGA using LRAD. PT Short Term Goal 3 - Progress (Week 2): Met Week 3:  PT Short Term Goal 1 (Week 3): STG=LTG due to ELOS.  Skilled Therapeutic Interventions/Progress Updates:     Patient in bed upon PT arrival. Patient alert and agreeable to PT session. Patient denied pain  at beginning of session and reported 4-5/10 R hip pain with activity during session.  Therapeutic Activity: Bed Mobility: Patient performed supine to sit with supervision for safety in a flat, elevated bed without use of bed rails to simulate home set-up. Provided verbal cues for R hip adduction to bring his R leg offf the bed. Transfers: Patient performed stand pivot bed>w/c and sit to/from stand x3 with CGA progressing to close supervision using a RW. Provided verbal cues for hand placement on RW and forward weight shift x1 and reaching back to sit x2.  Gait Training:  Patient ambulated 134 feet and 150 feet using RW with CGA and intermittent close supervision. Ambulated with reciprocal gait pattern, antalgic gait on R, decreased gait speed, mild forward trunk flexion, and downward head gaze. Provided verbal cues for erect posture, looking ahead and scanning environment using R peripheral vision to locate objects and upcoming turns, required less verbal cues for navigation when using scanning.  Neuromuscular Re-ed: Patient performed the following standing balance activities for improved standing tolerance and balance: -propelled w/c with B lower extremities for increased R hamstring activation x25 ft, educated on use of this technique with transport chair for mobility -standing placing large stand-up connect 4 pieces into the stand then placing them back on the rack >5 min Patient mildly SOB and with increased fatigue and R hip pain after activity. Educated on daily tasks, such as making lunch, that would require similar balance and standing tolerance and educated on energy conservation techniques, like having a chair for rest breaks, or making a sandwich while seated at the kitchen table.   Patient  in w/c at end of session with breaks locked, seat belt alarm set, and all needs within reach.   Therapy Documentation Precautions:  Precautions Precautions: Fall Precaution Comments: foley, T4  compression fx (no brace) Restrictions Weight Bearing Restrictions: No RLE Weight Bearing: Weight bearing as tolerated LLE Weight Bearing: Weight bearing as tolerated Agitated Behavior Scale: TBI Observation Details Observation Environment: pt room Start of observation period - Date: 10/28/20 Start of observation period - Time: 1110 End of observation period - Date: 10/28/20 End of observation period - Time: 1205 Agitated Behavior Scale (DO NOT LEAVE BLANKS) Short attention span, easy distractibility, inability to concentrate: Absent Impulsive, impatient, low tolerance for pain or frustration: Absent Uncooperative, resistant to care, demanding: Absent Violent and/or threatening violence toward people or property: Absent Explosive and/or unpredictable anger: Absent Rocking, rubbing, moaning, or other self-stimulating behavior: Absent Pulling at tubes, restraints, etc.: Absent Wandering from treatment areas: Absent Restlessness, pacing, excessive movement: Absent Repetitive behaviors, motor, and/or verbal: Absent Rapid, loud, or excessive talking: Absent Sudden changes of mood: Absent Easily initiated or excessive crying and/or laughter: Absent Self-abusiveness, physical and/or verbal: Absent Agitated behavior scale total score: 14  Therapy/Group: Individual Therapy  Annie Roseboom L Katlynn Naser PT, DPT  10/28/2020, 8:39 PM

## 2020-10-28 NOTE — Progress Notes (Signed)
Pt dressings changed on r hip. Old dressings CDI, incision sites fully epithelized, CDI

## 2020-10-28 NOTE — Plan of Care (Signed)
  Problem: RH Balance Goal: LTG Patient will maintain dynamic standing with ADLs (OT) Description: LTG:  Patient will maintain dynamic standing balance with assist during activities of daily living (OT)  Flowsheets (Taken 10/28/2020 1221) LTG: Pt will maintain dynamic standing balance during ADLs with: Contact Guard/Touching assist Note: Downgraded d/t progress SS OT   Problem: RH Toileting Goal: LTG Patient will perform toileting task (3/3 steps) with assistance level (OT) Description: LTG: Patient will perform toileting task (3/3 steps) with assistance level (OT)  Flowsheets (Taken 10/28/2020 1221) LTG: Pt will perform toileting task (3/3 steps) with assistance level: Contact Guard/Touching assist Note: Downgraded d/t progress SS OT   Problem: RH Toilet Transfers Goal: LTG Patient will perform toilet transfers w/assist (OT) Description: LTG: Patient will perform toilet transfers with assist, with/without cues using equipment (OT) Flowsheets (Taken 10/28/2020 1221) LTG: Pt will perform toilet transfers with assistance level of: Contact Guard/Touching assist Note: Downgraded d/t progress SS OT

## 2020-10-28 NOTE — Progress Notes (Signed)
Speech Language Pathology Weekly Progress and Session Note  Patient Details  Name: Nathan Mitchell MRN: [id number] Date of Birth: Apr 04, 1931  Beginning of progress report period: October 21, 2020 End of progress report period: October 28, 2020  Today's Date: 10/28/2020 SLP Individual Time: 1500-1530 SLP Individual Time Calculation (min): 30 min  Short Term Goals: Week 2: SLP Short Term Goal 1 (Week 2): Patient will recall daily, functional information with occ verbal cues for use of strategies. SLP Short Term Goal 1 - Progress (Week 2): Met SLP Short Term Goal 2 (Week 2): Patient will appropriately initiate and sequence during functional tasks and ADLs with Min A verbal cues    New Short Term Goals: Week 3: SLP Short Term Goal 1 (Week 3): STG=LTG due to ELOS  Weekly Progress Updates: Patient demonstrates consistent progress toward goals and met 2 of 2 short-term goals. Patient currently benefits from supervision A-to-Min A verbal cues for recall, problem solving, and awareness. He has demonstrated improvement with awareness of deficits and improved anticipatory problem solving to promote improved safety awareness, although there continue to be times he exhibits limited insight. Per spouse, patient exhibits some confusion when taking pain medication. Patient is making progress toward LTGs. Anticipate patient will require 24/7 supervision at discharge. Patient would continue to benefit from skilled ST services to maximize cognitive functions and overall independence.  Intensity: Minumum of 1-2 x/day, 30 to 90 minutes Frequency: 3 to 5 out of 7 days Duration/Length of Stay: 7/6 Treatment/Interventions: Cognitive remediation/compensation;Environmental controls;Cueing hierarchy;Functional tasks;Patient/family education;Therapeutic Activities   Daily Session Skilled Therapeutic Interventions: Skilled ST treatment performed with focus on cognitive goals. SLP facilitated anticipatory problem  solving using home safety scenarios at supervision A verbal cues. Awareness of current deficits appears to be improving, although continues to be limited at times. Discussed uses of various strategies and aids such as a reacher in specific per conversation that took place with spouse, however patient expressed it is difficult to use effectively due to his vision. Patient reports most helpful strategy is ensuring things are left in the same place and someone is available to orient him to his surroundings. Patient verbally sequenced steps in dressing, brushing teeth, and preparing meal with independence. Patient was left in bed with alarm activated and needs within reach. Continue per ST POC.   General    Pain Pain Assessment Pain Scale: 0-10 Pain Score: 0-No pain  Therapy/Group: Individual Therapy  Patty Sermons 10/28/2020, 4:01 PM

## 2020-10-28 NOTE — Progress Notes (Signed)
Physical Therapy TBI Note  Patient Details  Name: Nathan Mitchell MRN: [id number] Date of Birth: 03-17-1931  Today's Date: 10/28/2020 PT Individual Time: 1345-1430 PT Individual Time Calculation (min): 45 min   Short Term Goals: Week 1:  PT Short Term Goal 1 (Week 1): Patient will perform bed mobility with min A consistently. PT Short Term Goal 1 - Progress (Week 1): Met PT Short Term Goal 2 (Week 1): Patient will perform basic transfers with min A consistently. PT Short Term Goal 2 - Progress (Week 1): Met PT Short Term Goal 3 (Week 1): Patient will ambulate >25 ft using LRAD. PT Short Term Goal 3 - Progress (Week 1): Met PT Short Term Goal 4 (Week 1): Patient will propel w/c >100 ft PT Short Term Goal 4 - Progress (Week 1): Met Week 2:  PT Short Term Goal 1 (Week 2): Patient will perform basic transfers with CGA consistently using LRAD. PT Short Term Goal 2 (Week 2): Patient will ambulate >75 ft using LRAD with CGA and reciprocal gait pattern. PT Short Term Goal 3 (Week 2): patient will perform standing balance during a functional task x5 min with CGA using LRAD. Week 3:     Skilled Therapeutic Interventions/Progress Updates:    Pt initially supine, agreeable to treatment session.  Supine to sit w/hob elevated and use of bedrail w/supervision, additional time. In sitting therapist donned shoes, pt maintains balance during this.  RLE LAQx x 8 w/cues to fully extend RLE. Pt requested assist to BR for BM. Sit to stand w/rw and cues for safe hand placment, cga from elevated bed. Gait 25f to/from commode and commode transfer w/min to cga, cues for navigation due to visual deficits, cues for safety w/commode transfer/hand placement. Pt continent of bowels. Dependent for hygiene in standing w/min assist for balance w/RW.  Max assist for clothing management, additional time. Gait 621fw/RW, cga, flexed posture, some c/o pain w/wbing on RLE/stance and 1 episode of mild knee buckling  when he "moved it the wrong way/pain", slow cadence. Turn/sit to edge of bed w/RW, cues for safety. Sit to supine w/min assist for LE management. Pt repositioned comfortably, provided w/icewater per his request. Pt left supine w/rails up x 4 alarm set, bed in lowest position, and needs in reach.  Therapy Documentation Precautions:  Precautions Precautions: Fall Precaution Comments: foley, T4 compression fx (no brace) Restrictions Weight Bearing Restrictions: No RLE Weight Bearing: Weight bearing as tolerated LLE Weight Bearing: Weight bearing as tolerated PAIN Pt reported pain w/RLE therex and w/wbing during functional mobility.  Treatment to tolerance, rest breaks and repositioning as needed.  Pain Assessment Pain Scale: 0-10 Pain Score: 0-No pain Agitated Behavior Scale: TBI Observation Details Observation Environment: pt room Start of observation period - Date: 10/28/20 Start of observation period - Time: 0900 End of observation period - Date: 10/28/20 End of observation period - Time: 1000 Agitated Behavior Scale (DO NOT LEAVE BLANKS) Short attention span, easy distractibility, inability to concentrate: Absent Impulsive, impatient, low tolerance for pain or frustration: Absent Uncooperative, resistant to care, demanding: Absent Violent and/or threatening violence toward people or property: Absent Explosive and/or unpredictable anger: Absent Rocking, rubbing, moaning, or other self-stimulating behavior: Absent Pulling at tubes, restraints, etc.: Absent Wandering from treatment areas: Absent Restlessness, pacing, excessive movement: Absent Repetitive behaviors, motor, and/or verbal: Absent Rapid, loud, or excessive talking: Absent Sudden changes of mood: Absent Easily initiated or excessive crying and/or laughter: Absent Self-abusiveness, physical and/or verbal: Absent Agitated behavior scale total  score: 14    Therapy/Group: Individual Therapy Callie Fielding,  [place] 10/28/2020, 4:02 PM

## 2020-10-28 NOTE — Progress Notes (Signed)
Patient ID: XACHARY HAMBLY III, male   DOB: 01/03/1931, 87 y.o.   MRN: [id number]  SW received updates from [facility] that they are able to accept HHPT/OT/SN referral for pt.   SW left message for pt wife Natalia Leatherwood [phone]) to inform on HHA.   SW ordered DME: 3in1 BSC and transport chair with [employer] via parachute requesting for DME to be delivered to the home.   SW emailed pt dtr Claris Che with above updates. She reports she sent email to pt mother and sister as well.   Cecile Sheerer, MSW, LCSWA Office: [phone] Cell: [phone] Fax: [phone]

## 2020-10-29 LAB — BASIC METABOLIC PANEL
Anion gap: 7 (ref 5–15)
BUN: 24 mg/dL — ABNORMAL HIGH (ref 8–23)
CO2: 27 mmol/L (ref 22–32)
Calcium: 8.9 mg/dL (ref 8.9–10.3)
Chloride: 104 mmol/L (ref 98–111)
Creatinine, Ser: 1.06 mg/dL (ref 0.61–1.24)
GFR, Estimated: >60 mL/min (ref >60)
Glucose, Bld: 107 mg/dL — ABNORMAL HIGH (ref 70–99)
Potassium: 4.2 mmol/L (ref 3.5–5.1)
Sodium: 138 mmol/L (ref 135–145)

## 2020-10-29 NOTE — Progress Notes (Signed)
Physical Therapy TBI Note  Patient Details  Name: Nathan Mitchell MRN: [id number] Date of Birth: 1930/07/17  Today's Date: 10/29/2020 PT Individual Time: 0800-0915 PT Individual Time Calculation (min): 75 min   Short Term Goals: Week 2:  PT Short Term Goal 1 (Week 2): Patient will perform basic transfers with CGA consistently using LRAD. PT Short Term Goal 1 - Progress (Week 2): Met PT Short Term Goal 2 (Week 2): Patient will ambulate >75 ft using LRAD with CGA and reciprocal gait pattern. PT Short Term Goal 2 - Progress (Week 2): Met PT Short Term Goal 3 (Week 2): patient will perform standing balance during a functional task x5 min with CGA using LRAD. PT Short Term Goal 3 - Progress (Week 2): Met Week 3:  PT Short Term Goal 1 (Week 3): STG=LTG due to ELOS.  Skilled Therapeutic Interventions/Progress Updates:   Received pt semi-reclined in bed, pt agreeable to PT session, and denied any pain at rest but reported increased R hip pain to 5/10 with mobility. RN notified and present to administer pain medication and repositioning, rest breaks, and distraction done to reduce pain levels during session. Session with emphasis on functional mobility/transfers, toileting, generalized strengthening, dynamic standing balance/coordination, gait training, and improved activity tolerance. Pt transferred semi-reclined<>sitting EOB with HOB elevated and use of bedrails with supervision while maintaining spinal precautions and donned shoes sitting EOB with max A for time management purposes. Pt reported urge to have BM and ambulated 39f with RW and CGA to bathroom. Pt continent of bowel and required total A for peri-care in standing and max A for clothing management. [facility] with set up assist. [facility] for time management purposes and ambulated 1862fwith RW and CGA with increased time. Pt demonstrated decreased cadence, narrow BOS, flexed  trunk, and decreased bilateral foot clearance with decreased stance phase on R and required cues to increase stride length and widen BOS. Adjusted RW to proper height and worked on dynamic standing balance performing alternating toe taps 1x5 to 3in step then modifying to 3x5 to 0.5in step using RW and CGA with emphasis on R lateral weight shifting and R hip flexion; limited by pain and fatigue. Worked on dynamic standing balance playing cornhole using RUE and RW with CGA for balance with emphasis on standing tolerance. [facility] and ambulated [id number] RW and CGA to recliner. Concluded session with pt sitting in recliner, needs within reach, and chair pad alarm on. Provided pt with ice pack for R hip for pain relief.   Therapy Documentation Precautions:  Precautions Precautions: Fall Precaution Comments: foley, T4 compression fx (no brace) Restrictions Weight Bearing Restrictions: No RLE Weight Bearing: Weight bearing as tolerated LLE Weight Bearing: Weight bearing as tolerated  Agitated Behavior Scale: TBI Observation Details Observation Environment: pt's room Start of observation period - Date: 10/29/20 Start of observation period - Time: 0800 End of observation period - Date: 10/29/20 End of observation period - Time: 0915 Agitated Behavior Scale (DO NOT LEAVE BLANKS) Short attention span, easy distractibility, inability to concentrate: Absent Impulsive, impatient, low tolerance for pain or frustration: Absent Uncooperative, resistant to care, demanding: Absent Violent and/or threatening violence toward people or property: Absent Explosive and/or unpredictable anger: Absent Rocking, rubbing, moaning, or other self-stimulating behavior: Absent Pulling at tubes, restraints, etc.: Absent Wandering from treatment areas: Absent Restlessness, pacing, excessive movement: Absent Repetitive behaviors,  motor, and/or verbal: Absent Rapid, loud, or excessive  talking: Absent Sudden changes of mood: Absent Easily initiated or excessive crying and/or laughter: Absent Self-abusiveness, physical and/or verbal: Absent Agitated behavior scale total score: 14  Therapy/Group: Individual Therapy Alfonse Alpers PT, DPT   10/29/2020, 7:23 AM

## 2020-10-29 NOTE — Progress Notes (Signed)
PROGRESS NOTE   Subjective/Complaints: No new problems. Pain seems controlled. Sleeping well  ROS: Patient denies fever, rash, sore throat, blurred vision, nausea, vomiting, diarrhea, cough, shortness of breath or chest pain, joint or back pain, headache, or mood change.     Objective:   No results found. No results for input(s): WBC, HGB, HCT, PLT in the last 72 hours.   Recent Labs    10/29/20 0454  NA 138  K 4.2  CL 104  CO2 27  GLUCOSE 107*  BUN 24*  CREATININE 1.06  CALCIUM 8.9    Intake/Output Summary (Last 24 hours) at 10/29/2020 1050 Last data filed at 10/29/2020 0833 Gross per 24 hour  Intake 360 ml  Output 1550 ml  Net -1190 ml        Physical Exam: Vital Signs Blood pressure 117/76, pulse 81, temperature 98.2 F (36.8 C), resp. rate 16, height 6\' 1"  (1.854 m), weight 78.8 kg, SpO2 94 %. Constitutional: No distress . Vital signs reviewed. HEENT: EOMI, oral membranes moist Neck: supple Cardiovascular: RRR without murmur. No JVD    Respiratory/Chest: CTA Bilaterally without wheezes or rales. Normal effort    GI/Abdomen: BS +, non-tender, non-distended Ext: no clubbing, cyanosis, or edema Psych: pleasant and cooperative Skin: intact, right hip wound  Neuro: reasonable insight, memory, and awareness. Speech clear. Normal language. Cranial nerves 2-12 are intact. Sensory exam is normal. Reflexes are 2+ in all 4's. Fine motor coordination is intact. No tremors. Motor function is grossly 5/5 in UE's. RLE 3/5 d/t pain prox to 4/5 distally. LLE 4-5/5 prox to distal.-improving  Musculoskeletal:  right hip tender with rom, edema decreased. Moving leg more freely now  Assessment/Plan: 1. Functional deficits which require 3+ hours per day of interdisciplinary therapy in a comprehensive inpatient rehab setting. Physiatrist is providing close team supervision and 24 hour management of active medical problems  listed below. Physiatrist and rehab team continue to assess barriers to discharge/monitor patient progress toward functional and medical goals  Care Tool:  Bathing    Body parts bathed by patient: Right arm, Left arm, Chest, Abdomen, Front perineal area, Right upper leg, Left upper leg, Face, Left lower leg, Right lower leg, Buttocks   Body parts bathed by helper: Right lower leg, Left lower leg, Buttocks     Bathing assist Assist Level: Contact Guard/Touching assist     Upper Body Dressing/Undressing Upper body dressing   What is the patient wearing?: Pull over shirt    Upper body assist Assist Level: Supervision/Verbal cueing    Lower Body Dressing/Undressing Lower body dressing      What is the patient wearing?: Pants     Lower body assist Assist for lower body dressing: Minimal Assistance - Patient > 75%     Toileting Toileting    Toileting assist Assist for toileting: Minimal Assistance - Patient > 75%     Transfers Chair/bed transfer  Transfers assist     Chair/bed transfer assist level: Contact Guard/Touching assist Chair/bed transfer assistive device:   Ambulation assist      Assist level: Contact Guard/Touching assist Assistive device: Walker-rolling Max distance: 163ft   Walk 10  feet activity   Assist  Walk 10 feet activity did not occur: Safety/medical concerns (Limited by increased R hip pain)  Assist level: Contact Guard/Touching assist Assistive device: Walker-rolling   Walk 50 feet activity   Assist Walk 50 feet with 2 turns activity did not occur: Safety/medical concerns  Assist level: Contact Guard/Touching assist Assistive device: Walker-rolling    Walk 150 feet activity   Assist Walk 150 feet activity did not occur: Safety/medical concerns  Assist level: Contact Guard/Touching assist Assistive device: Walker-rolling    Walk 10 feet on uneven surface  activity   Assist Walk 10  feet on uneven surfaces activity did not occur: Safety/medical concerns         Wheelchair     Assist Will patient use wheelchair at discharge?: Yes Type of Wheelchair: Manual Wheelchair activity did not occur: Safety/medical concerns (Limited by increased R hip pain during evaluation)  Wheelchair assist level: Supervision/Verbal cueing Max wheelchair distance: 55 ft    Wheelchair 50 feet with 2 turns activity    Assist    Wheelchair 50 feet with 2 turns activity did not occur: Safety/medical concerns   Assist Level: Supervision/Verbal cueing   Wheelchair 150 feet activity     Assist  Wheelchair 150 feet activity did not occur: Safety/medical concerns   Assist Level: Contact Guard/Touching assist   Blood pressure 117/76, pulse 81, temperature 98.2 F (36.8 C), resp. rate 16, height 6\' 1"  (1.854 m), weight 78.8 kg, SpO2 94 %.        Medical Problem List and Plan: 1.  TBI with associated SDH. Also suffered right IT hip fracture, T4 compression fx             -patient may not shower             -ELOS: 7/6             -Continue CIR therapies including PT, OT, and SLP     2.  Impaired mobility -DVT/anticoagulation:  Pharmaceutical: Continue Lovenox bid, need to check with ortho how long he will need this so we can provide injection education if warranted.              -antiplatelet therapy: N/A 3. Right IT hip fracture: Schedule tylenol 650mg  TID. LFTs reviewed and wnl. Continue oxycodone/robaxin prn, discussed with team that he is not using much, can consider using prior to therapy to maximize participation.  4. Mood: LCSW to follow for evaluation and support.             -antipsychotic agents: N/A 5. Neuropsych: This patient is not fully capable of making decisions on his own behalf.             --cognition improving. Probably close to baseline    6. Skin/Wound Care: foam dressing to right hip incision-minimal to no drainage 7.  Fluids/Electrolytes/Nutrition: Appetite has been good per wife. -- encourage PO as BUN/Cr elevated 6/27 -BUN/Cr stable to improved   6/30---continue to push fluids -protein supp for low albumin 8. Right femur fracture: WBAT.              --wounds healing nicely  -removed staples, dc'ed sutures too   9. L-SDH: off Keprra since 6/16  no seizures            10 Giant cell arteritis with visual loss: Was on methotrexate weekly. --Spoke to Dr. 7/27 6/16---resumed MTX 6/24. T bili normalized. Folic acid changed back to 1mg  dose.  11. Nickola Major HR  tid--incidental finding years ago and has been controlled.             --To follow up with Card? PCP regarding d/c of Xarelto per notes. 12. Constipation: Continue miralax.  13. Urinary retention: Has failed one voiding trial by Dr. Cardell Peach.             --plans for ultrasound of bladder/prostate? 06/24?             --Continue Flomax at bedtime.  -RN changed foley 6/17---consider change again prior to dc? 14. Leucocytosis: Resolving without signs of infection             --encourage pulmonary toilet for likely Right atelectasis. 15. ABLA: Continue to monitor H/H with serial checks and monitor for signs of bleeding. --Monitor hematoma right hip./flank.             -6/15 hgb looks to be trending   up, 7.5 ---> 8.9 6/27  -Continue ferrous gluconate 324mg  MWF.  16. H/o COPD: Stable. Did not like trial of MDI per wife.  17. Vitamin B12 deficiency: Continue Vitamin B12 daily ordered 18. Hypotension: bp's soft but asymptomatic   LOS: 16 days A FACE TO FACE EVALUATION WAS PERFORMED  10/29/2020, 10:50 AM

## 2020-10-29 NOTE — Progress Notes (Signed)
Physical Therapy TBI Note  Patient Details  Name: Nathan Mitchell MRN: [id number] Date of Birth: 1931/03/12  Today's Date: 10/29/2020 PT Individual Time: 1425-1525 PT Individual Time Calculation (min): 60 min   Short Term Goals: Week 3:  PT Short Term Goal 1 (Week 3): STG=LTG due to ELOS.  Skilled Therapeutic Interventions/Progress Updates:     Patient in recliner upon PT arrival. Patient alert and agreeable to PT session. Patient reported 4-5/10 R hip pain during session, RN made aware. PT provided repositioning, rest breaks, and distraction as pain interventions throughout session.   Therapeutic Activity: Bed Mobility: Patient performed sit to supine with supervision and increased time for R lower extremity management. Provided verbal cues for R hip abduction to lift his leg onto the bed. Transfers: Patient performed sit to/from stand using RW from hospital recliner, w/c x3, and standard arm chair with close supervision. Provided verbal cues for touching the back of his legs to each surface before sitting to ensure positioning in front of the chair for safety. Patient performed a simulated sedan height car transfer with min A for R lower extremity management using RW and car seat reclined to reduce hip flexion angle when bringing his legs into the car. Provided cues for safe technique. Patient performed simulated task of preparing his breakfast in ADL kitchen for increased standing tolerance and dynamic balance with a functional task >8 min. Patient often pointed and described what he would do, required cues to simulate the task with objects available (glass cups, water from the sink, opening refrigerator and picking up objects. Patient walked through simulations of pouring orange juice, preparing toast, starting his coffee. Patient located a glass opening and closing cabinets without assist. Patient with difficulty managing holding items and mobilizing with RW, often stabilizing himself at  the counter and reaching across the kitchen for items. Patient SOB during and after activity, HR 86, SPO2 93%. Following task patient as at the kitchen table and PT discussed energy conservation strategies to make this task easier at d/c. Patient reluctant to change his routine and will need follow-up education on energy conservation and fall prevention at d/c.   Patient in bed at end of session with breaks locked, bed alarm set, and all needs within reach.   Therapy Documentation Precautions:  Precautions Precautions: Fall Precaution Comments: foley, T4 compression fx (no brace) Restrictions Weight Bearing Restrictions: No RLE Weight Bearing: Weight bearing as tolerated LLE Weight Bearing: Weight bearing as tolerated    Therapy/Group: Individual Therapy  Talishia Betzler L Shanetta Nicolls PT, DPT  10/29/2020, 8:46 PM

## 2020-10-29 NOTE — Progress Notes (Signed)
Occupational Therapy TBI Note  Patient Details  Name: Nathan Mitchell MRN: [id number] Date of Birth: [birth year]  Today's Date: 10/29/2020 OT Individual Time: 1101-1157 OT Individual Time Calculation (min): 56 min    Short Term Goals: Week 3:  OT Short Term Goal 1 (Week 3): STG=LTG d/t ELOS  Skilled Therapeutic Interventions/Progress Updates:    Pt received in room in recliner and consented to OT tx. Pt completed STS and walked with RW and CGA from recliner to w/c and sat down to complete grooming task. Pt instructed in shaving task with min cuing for proficiency 2/2 visual deficits. Pt then wheeled down to therapy gym for instruction and training in BUE strengthening HEP to increase strength and activity tolerance for ADLs and functional transfers. Pt instructed in 3# db unilateral exercises including elbow flexion, shoulder press, chest press, shoulder flexion, and tricep extension all for 3x15 with min cuing for proper technique with good carryover. After tx, pt helped back to room, walked from doorway to recliner with RW and CGA. Pt left in recliner, chair alarm on, and all needs met.   Therapy Documentation Precautions:  Precautions Precautions: Fall Precaution Comments: foley, T4 compression fx (no brace) Restrictions Weight Bearing Restrictions: No RLE Weight Bearing: Weight bearing as tolerated LLE Weight Bearing: Weight bearing as tolerated  Pain: Pain Assessment Pain Scale: 0-10 Pain Score: 0-No pain Agitated Behavior Scale: TBI Observation Details Observation Environment: CIR gym Start of observation period - Date: 10/29/20 Start of observation period - Time: 1101 End of observation period - Date: 10/29/20 End of observation period - Time: 1157 Agitated Behavior Scale (DO NOT LEAVE BLANKS) Short attention span, easy distractibility, inability to concentrate: Absent Impulsive, impatient, low tolerance for pain or frustration: Absent Uncooperative, resistant to  care, demanding: Absent Violent and/or threatening violence toward people or property: Absent Explosive and/or unpredictable anger: Absent Rocking, rubbing, moaning, or other self-stimulating behavior: Absent Pulling at tubes, restraints, etc.: Absent Wandering from treatment areas: Absent Restlessness, pacing, excessive movement: Absent Repetitive behaviors, motor, and/or verbal: Absent Rapid, loud, or excessive talking: Absent Sudden changes of mood: Absent Easily initiated or excessive crying and/or laughter: Absent Self-abusiveness, physical and/or verbal: Absent Agitated behavior scale total score: 14    Therapy/Group: Individual Therapy  Josey Forcier 10/29/2020, 11:33 AM

## 2020-10-29 NOTE — Progress Notes (Addendum)
Speech Language Pathology Discharge Summary  Patient Details  Name: WAYDEN SCHWERTNER MRN: [id number] Date of Birth: July 25, 1930  Today's Date: 10/29/2020 SLP Individual Time: 1400-1425 SLP Individual Time Calculation (min): 25 min   Skilled Therapeutic Interventions:  Skilled treatment session focused on cognitive goals. Upon arrival, patient was awake in in the recliner and independently recalled events from his previous therapy sessions. SLP utilized session to educate patient regarding his current cognitive and physical deficits and their impact on his function at home. Patient verbalized understanding and verbalized strategies to utilize at home to maximize overall safety. Patient left upright in bed with alarm on and all needs within reach.   Patient has met 3 of 3 long term goals.  Patient to discharge at overall Supervision level.   Reasons goals not met: N/A   Clinical Impression/Discharge Summary: Patient has made functional gains and has met 3 of 3 LTGs this admission. Currently, patient demonstrates behaviors consistent with a Rancho Level VII-VIII requires overall supervision level verbal cues for recall of functional information and for overall awareness of his current deficits and their impact on his function and safety. Suspect patient is at his baseline level of cognitive functioning, therefore, patient will be discharged from skilled SLP intervention with f/u not warranted at this time. Patient verbalized understanding and agreement.   Care Partner:  Caregiver Able to Provide Assistance: Yes  Type of Caregiver Assistance: Physical  Recommendation:  24 hour supervision/assistance;None      Equipment: N/A   Reasons for discharge: Treatment goals met   Patient/Family Agrees with Progress Made and Goals Achieved: Yes    Levent Kornegay 10/29/2020, 3:22 PM

## 2020-10-30 NOTE — Progress Notes (Signed)
Patient ID: Nathan Mitchell, male   DOB: Oct 14, 1930, 87 y.o.   MRN: [id number]  SW sent HHPT/OT/SN referral to [facility].   SW received message from pt wife requesting that pt have his catheter change prior to d/c. SW updated medical team. Attending intends to have changed before pt leaves.   SW left pt wife a message [phone]) and SW sent an email to pt dtr Claris Che to inform on updates as well.   Cecile Sheerer, MSW, LCSWA Office: [phone] Cell: [phone] Fax: [phone]

## 2020-10-30 NOTE — Progress Notes (Signed)
Inpatient Rehabilitation Care Coordinator Discharge Note  The overall goal for the admission was met for:   Discharge location: Yes. D/c to home with pt wife, and private aide (24/7).  Length of Stay: Yes. 21 days.   Discharge activity level: Yes. Min A.  Home/community participation: Yes. Limited.   Services provided included: MD, RD, PT, OT, SLP, RN, CM, TR, Pharmacy, Neuropsych, and SW  Financial Services: Private Insurance: [employer]  Choices offered to/list presented to:Yes  Follow-up services arranged: Home Health: [facility] for HHPT/OT/SN, DME: 3in1 BSC and transport chair with [facility], and Patient/Family request agency HH: [facility], DME: N/A  Comments (or additional information):  Patient/Family verbalized understanding of follow-up arrangements: Yes  Individual responsible for coordination of the follow-up plan: contact pt wife Perrin Smack [phone] pt dtr [name] [phone]  Confirmed correct DME delivered: Rana Snare 10/30/2020    Rana Snare

## 2020-10-30 NOTE — Progress Notes (Signed)
Occupational Therapy Session Note  Patient Details  Name: Nathan Mitchell MRN: [id number] Date of Birth: [birth year]  Today's Date: 10/30/2020 OT Individual Time: 0700-0810 OT Individual Time Calculation (min): 70 min    Short Term Goals: Week 3:  OT Short Term Goal 1 (Week 3): STG=LTG d/t ELOS  Skilled Therapeutic Interventions/Progress Updates:     Pt received in bed with 0 out of 10 pain in hip. Pt finishing breakfast and OT discusses family education process as family still needs to come in to understand how to assist pt as needed.  ADL:  Pt completes bathing with supervision overall seated on BSC with lateral leans to wash buttocks. Cuing only d.t locating items because of visual deficits Pt completes UB dressing with set up Pt completes LB dressing with VC for doffing strategy and MOD A to don d/t visual deficits impacting reacher use Pt completes footwear with VC for doffing strategy and MIN A to don using shoe funnel to guid R heel into shoe. Pt completes toileting with S at sit to stand level with RW Pt completes toileting transfer with S with RW ambulating into and out of bathroom Pt completes shower/Tub transfer with S with VC for where to reach back to not tip shower chair backwards   Pt left at end of session in bed with exit alarm on, call light in reach and all needs met   Therapy Documentation Precautions:  Precautions Precautions: Fall Precaution Comments: foley, T4 compression fx (no brace) Restrictions Weight Bearing Restrictions: No RLE Weight Bearing: Weight bearing as tolerated LLE Weight Bearing: Weight bearing as tolerated General:   Vital Signs: Therapy Vitals Temp: (!) 97.5 F (36.4 C) Temp Source: Oral Pulse Rate: 96 Resp: 16 BP: 126/65 Patient Position (if appropriate): Lying Oxygen Therapy SpO2: 94 % O2 Device: Room Air Pain:   ADL: ADL Eating: Supervision/safety Where Assessed-Eating: Chair Grooming: Minimal assistance Upper  Body Bathing: Supervision/safety Where Assessed-Upper Body Bathing: Chair Lower Body Bathing: Maximal assistance Where Assessed-Lower Body Bathing: Chair Upper Body Dressing: Contact guard Where Assessed-Upper Body Dressing: Chair Lower Body Dressing: Dependent Where Assessed-Lower Body Dressing: Chair Toileting: Dependent Where Assessed-Toileting: Bedside Commode Toilet Transfer: Dependent Toilet Transfer Method:  (stedy- MOD A power up) Vision   Perception    Praxis   Exercises:   Other Treatments:     Therapy/Group: Individual Therapy  Tonny Branch 10/30/2020, 6:54 AM

## 2020-10-30 NOTE — Progress Notes (Signed)
Occupational Therapy TBI Note  Patient Details  Name: Nathan Mitchell MRN: [id number] Date of Birth: 12-25-1930  Today's Date: 10/30/2020 OT Individual Time: 1002-1100 OT Individual Time Calculation (min): 58 min    Short Term Goals: Week 3:  OT Short Term Goal 1 (Week 3): STG=LTG d/t ELOS  Skilled Therapeutic Interventions/Progress Updates:    Pt greeted semi-reclined in bed with wife and daughters present for family education. Pt demonstrated bed mobility with supervision. Educated on adaptive equipment and techniques to assist with LB dressing. OT wrote down names of AE including reacher, sock-aid, and shoe funnel. Discussed wife setting pt up with these devices 2.2 low vision. Pt demonstrated sit<>stands and functional ambulation with RW and CGA. Pt;s wife and a caregiver will be present at home, daughters will not be assisting at home. Pt brought to therapy apartment and set-up walk-in shower transfer in simulated home environment using 3-in-1 BSC as shower seat. CGA for stepping over shower ledge. Educated on high risk of falls and ways to decrease fall risk in home environment> Pt reported he likes sitting in rocking chair. Had pt practice sit><stands from rocking recliner, with min A. OT suggested pt not sit in rocking chair at home until his sit<>stands and balance are better. OT issued UE exercise program using green therabnd. Answered any questions family had in regards to BADL performance. Pt returned to room and left seated in wc with alarm belt on, call bell in reach, and needs met.   Therapy Documentation Precautions:  Precautions Precautions: Fall Precaution Comments: foley, T4 compression fx (no brace) Restrictions Weight Bearing Restrictions: No RLE Weight Bearing: Weight bearing as tolerated LLE Weight Bearing: Weight bearing as tolerated Pain:  Pt reports mild pain, no number given. Rest and repositioned for comfort Agitated Behavior Scale: TBI Observation  Details Observation Environment: CIR Start of observation period - Date: 10/30/20 Start of observation period - Time: 1000 End of observation period - Date: 10/30/20 End of observation period - Time: 1100 Agitated Behavior Scale (DO NOT LEAVE BLANKS) Short attention span, easy distractibility, inability to concentrate: Absent Impulsive, impatient, low tolerance for pain or frustration: Absent Uncooperative, resistant to care, demanding: Absent Violent and/or threatening violence toward people or property: Absent Explosive and/or unpredictable anger: Absent Rocking, rubbing, moaning, or other self-stimulating behavior: Absent Pulling at tubes, restraints, etc.: Absent Wandering from treatment areas: Absent Restlessness, pacing, excessive movement: Absent Repetitive behaviors, motor, and/or verbal: Absent Rapid, loud, or excessive talking: Absent Sudden changes of mood: Absent Easily initiated or excessive crying and/or laughter: Absent Self-abusiveness, physical and/or verbal: Absent Agitated behavior scale total score: 14  Therapy/Group: Individual Therapy  Valma Cava 10/30/2020, 11:33 AM

## 2020-10-30 NOTE — Progress Notes (Signed)
PROGRESS NOTE   Subjective/Complaints: Anxious to get home. Right thigh still sore but working through it  ROS: Patient denies fever, rash, sore throat, blurred vision, nausea, vomiting, diarrhea, cough, shortness of breath or chest pain,   headache, or mood change.    Objective:   No results found. No results for input(s): WBC, HGB, HCT, PLT in the last 72 hours.   Recent Labs    10/29/20 0454  NA 138  K 4.2  CL 104  CO2 27  GLUCOSE 107*  BUN 24*  CREATININE 1.06  CALCIUM 8.9    Intake/Output Summary (Last 24 hours) at 10/30/2020 1203 Last data filed at 10/30/2020 0745 Gross per 24 hour  Intake 960 ml  Output 2525 ml  Net -1565 ml        Physical Exam: Vital Signs Blood pressure 126/65, pulse 96, temperature (!) 97.5 F (36.4 C), temperature source Oral, resp. rate 16, height 6\' 1"  (1.854 m), weight 80.4 kg, SpO2 94 %. Constitutional: No distress . Vital signs reviewed. HEENT: EOMI, oral membranes moist Neck: supple Cardiovascular: RRR without murmur. No JVD    Respiratory/Chest: CTA Bilaterally without wheezes or rales. Normal effort    GI/Abdomen: BS +, non-tender, non-distended Ext: no clubbing, cyanosis, or edema Psych: pleasant and cooperative  Skin: intact, right hip wound  Neuro: functional insight, memory, and awareness. Speech clear. Normal language. Cranial nerves 2-12 are intact. Sensory exam is normal. Reflexes are 2+ in all 4's. Fine motor coordination is intact. No tremors. Motor function is grossly 5/5 in UE's. RLE 3+/5 d/t pain prox to 4/5 distally. LLE 4-5/5 prox to distal.-improved Musculoskeletal:  right hip tender with rom, edema decreased. Moving leg more freely now  Assessment/Plan: 1. Functional deficits which require 3+ hours per day of interdisciplinary therapy in a comprehensive inpatient rehab setting. Physiatrist is providing close team supervision and 24 hour management of  active medical problems listed below. Physiatrist and rehab team continue to assess barriers to discharge/monitor patient progress toward functional and medical goals  Care Tool:  Bathing    Body parts bathed by patient: Right arm, Left arm, Chest, Abdomen, Front perineal area, Right upper leg, Left upper leg, Face, Left lower leg, Right lower leg, Buttocks   Body parts bathed by helper: Right lower leg, Left lower leg, Buttocks     Bathing assist Assist Level: Contact Guard/Touching assist     Upper Body Dressing/Undressing Upper body dressing   What is the patient wearing?: Pull over shirt    Upper body assist Assist Level: Supervision/Verbal cueing    Lower Body Dressing/Undressing Lower body dressing      What is the patient wearing?: Pants     Lower body assist Assist for lower body dressing: Minimal Assistance - Patient > 75%     Toileting Toileting    Toileting assist Assist for toileting: Minimal Assistance - Patient > 75%     Transfers Chair/bed transfer  Transfers assist     Chair/bed transfer assist level: Contact Guard/Touching assist Chair/bed transfer assistive device:   Ambulation assist      Assist level: Contact Guard/Touching assist Assistive device: Walker-rolling Max distance:  1108ft   Walk 10 feet activity   Assist  Walk 10 feet activity did not occur: Safety/medical concerns (Limited by increased R hip pain)  Assist level: Contact Guard/Touching assist Assistive device: Walker-rolling   Walk 50 feet activity   Assist Walk 50 feet with 2 turns activity did not occur: Safety/medical concerns  Assist level: Contact Guard/Touching assist Assistive device: Walker-rolling    Walk 150 feet activity   Assist Walk 150 feet activity did not occur: Safety/medical concerns  Assist level: Contact Guard/Touching assist Assistive device: Walker-rolling    Walk 10 feet on uneven surface   activity   Assist Walk 10 feet on uneven surfaces activity did not occur: Safety/medical concerns         Wheelchair     Assist Will patient use wheelchair at discharge?: Yes Type of Wheelchair: Manual Wheelchair activity did not occur: Safety/medical concerns (Limited by increased R hip pain during evaluation)  Wheelchair assist level: Supervision/Verbal cueing Max wheelchair distance: 55 ft    Wheelchair 50 feet with 2 turns activity    Assist    Wheelchair 50 feet with 2 turns activity did not occur: Safety/medical concerns   Assist Level: Supervision/Verbal cueing   Wheelchair 150 feet activity     Assist  Wheelchair 150 feet activity did not occur: Safety/medical concerns   Assist Level: Contact Guard/Touching assist   Blood pressure 126/65, pulse 96, temperature (!) 97.5 F (36.4 C), temperature source Oral, resp. rate 16, height 6\' 1"  (1.854 m), weight 80.4 kg, SpO2 94 %.        Medical Problem List and Plan: 1.  TBI with associated SDH. Also suffered right IT hip fracture, T4 compression fx             -patient may not shower             -ELOS: 7/6            -Continue CIR therapies including PT, OT, and SLP     2.  Impaired mobility -DVT/anticoagulation:  Pharmaceutical: Continue Lovenox bid, need to check with ortho how long he will need this so we can provide injection education if warranted.              -antiplatelet therapy: N/A 3. Right IT hip fracture: Schedule tylenol 650mg  TID. LFTs reviewed and wnl. Continue oxycodone/robaxin prn, discussed with team that he is not using much, can consider using prior to therapy to maximize participation.  4. Mood: LCSW to follow for evaluation and support.             -antipsychotic agents: N/A 5. Neuropsych: This patient is not fully capable of making decisions on his own behalf.             --cognition improving. Probably close to baseline    6. Skin/Wound Care: foam dressing to right hip  incision-minimal to no drainage 7. Fluids/Electrolytes/Nutrition: Appetite has been good per wife. -- encourage PO as BUN/Cr elevated 6/27 -BUN/Cr stable to improved   6/30---continue to push fluids -protein supp for low albumin -recheck BMET Tuesday 8. Right femur fracture: WBAT.              --wounds healing nicely  -removed staples, dc'ed sutures too   9. L-SDH: off Keprra since 6/16  no seizures            10 Giant cell arteritis with visual loss: Was on methotrexate weekly. --Spoke to Dr. Thursday 6/16---resumed MTX 6/24. T bili normalized.  Folic acid changed back to 1mg  dose.  11. HR tid--incidental finding years ago and has been controlled.             --To follow up with Card? PCP regarding d/c of Xarelto per notes. 12. Constipation: Continue miralax.  13. Urinary retention: Has failed one voiding trial by Dr. [id number].             --plans for ultrasound of bladder/prostate? 06/24?             --Continue Flomax at bedtime.  -RN changed foley 6/17---will change out foley again prior to discharge 14. Leucocytosis: Resolving without signs of infection             --encourage pulmonary toilet for likely Right atelectasis. 15. ABLA: Continue to monitor H/H with serial checks and monitor for signs of bleeding. --Monitor hematoma right hip./flank.             -6/15 hgb looks to be trending   up, 7.5 ---> 8.9 6/27  -Continue ferrous gluconate 324mg  MWF.  16. H/o COPD: Stable. Did not like trial of MDI per wife.  17. Vitamin B12 deficiency: Continue Vitamin B12 7/27 daily ordered 18. Hypotension: bp's soft but asymptomatic   LOS: 17 days A FACE TO FACE EVALUATION WAS PERFORMED  10/30/2020, 12:03 PM

## 2020-10-30 NOTE — Progress Notes (Signed)
Physical Therapy Session Note  Patient Details  Name: Nathan Mitchell MRN: [id number] Date of Birth: 06/28/1930  Today's Date: 10/30/2020 PT Individual Time: 1406-1505 PT Individual Time Calculation (min): 59 min   Short Term Goals: Week 2:  PT Short Term Goal 1 (Week 2): Patient will perform basic transfers with CGA consistently using LRAD. PT Short Term Goal 1 - Progress (Week 2): Met PT Short Term Goal 2 (Week 2): Patient will ambulate >75 ft using LRAD with CGA and reciprocal gait pattern. PT Short Term Goal 2 - Progress (Week 2): Met PT Short Term Goal 3 (Week 2): patient will perform standing balance during a functional task x5 min with CGA using LRAD. PT Short Term Goal 3 - Progress (Week 2): Met  Skilled Therapeutic Interventions/Progress Updates: Pt presents supine in bed w/ 2 daughters present for family ed.  Pt agreeable to therapy.  Pt transfers sup to sit w/ supervision, w/ cueing for log roll from flat bed and w/o rails.  Pt transfers sit to stand w/ CGA and verbal cues and education to family for hand placement and initiation to improve safety and independence.  Pt amb 120' w/ RW and CGA to close supervision.  Verbal cues for posture and BOS, education to family for safety.  Pt negotiated (1) 4" platform w/ RW and min A.  Dtr performed w/ pt w/ assist/cue help to dtr for proper sequencing.  Pt amb 120' w/ RW and CGA to small gym.  Pt transferred in/out of simulated car w/ CGA.  Pt w/ visual deficits so required education on looking for color differences for safe negotiation around objects.  Educated dtrs for improved transfers from all surfaces w/ appropriate sequencing.  Pt amb from hallway to bed including turn to approach bed.  Pt required min A for RLE into bed.  Dtrs state that will be entering bed at home from other side and explained will be better able to enter bed w/ LLE first.  Bed alarm on and all needs in reach.     Therapy Documentation Precautions:   Precautions Precautions: Fall Precaution Comments: foley, T4 compression fx (no brace) Restrictions Weight Bearing Restrictions: No RLE Weight Bearing: Weight bearing as tolerated LLE Weight Bearing: Weight bearing as tolerated General:   Vital Signs: Therapy Vitals Temp: 98.2 F (36.8 C) Temp Source: Oral Pulse Rate: 87 Resp: 16 BP: 118/68 Patient Position (if appropriate): Sitting Oxygen Therapy SpO2: 98 % O2 Device: Room Air Pain:6/10 initially, decreased to 5/10 w/ activity.      Therapy/Group: Individual Therapy   P  10/30/2020, 3:49 PM  

## 2020-10-31 NOTE — Progress Notes (Signed)
Occupational Therapy Session Note  Patient Details  Name: Nathan Mitchell MRN: [id number] Date of Birth: 03/09/1931  Today's Date: 10/31/2020 OT Individual Time: 0942-1012 OT Individual Time Calculation (min): 30 min    Short Term Goals: Week 2:  OT Short Term Goal 1 (Week 2): Pt will use LRAD to don pants with min A OT Short Term Goal 1 - Progress (Week 2): Met OT Short Term Goal 2 (Week 2): Pt will complete walk in shower transfers with min A and min cueing OT Short Term Goal 2 - Progress (Week 2): Met OT Short Term Goal 3 (Week 2): Pt will require no more than min cueing during shower level bathing for initiation and thoroughness OT Short Term Goal 3 - Progress (Week 2): Met  Skilled Therapeutic Interventions/Progress Updates: [name] participated in skilled OT services as follows:  Donning pull on shoes: He had great difficulty with left hip flexion and right knee flexion and fatigued and became short of breathe while trying to don.  He required Moderate assistance and extra time.   He would have completed the task more independently had a long handled shoe horn been available.  Otherwise, he completed dyamic upper body and core strengthening and endurance.   Patient exhibited low endurance and required several 10-30 second rest breaks throughout this portion of the session.  He will benenfit from more opportunities to don shoes, increase dyamic and static seat and standing balance and endurance work in order to increase independence in ADLs, IADLs and functional mobility for self care function.  Continue OT Plan of care     Therapy Documentation Precautions:  Precautions Precautions: Fall Precaution Comments: foley, T4 compression fx (no brace) Restrictions Weight Bearing Restrictions: No RLE Weight Bearing: Weight bearing as tolerated LLE Weight Bearing: Weight bearing as tolerated  Pain:denied     Therapy/Group: Individual Therapy  ,   [name] 10/31/2020, 12:53 PM 

## 2020-10-31 NOTE — Progress Notes (Signed)
PROGRESS NOTE   Subjective/Complaints: No new complaints. Asked when his right leg will stop being sore! Anxious to get home.  ROS: Patient denies fever, rash, sore throat, blurred vision, nausea, vomiting, diarrhea, cough, shortness of breath or chest pain,   headache, or mood change. .    Objective:   No results found. No results for input(s): WBC, HGB, HCT, PLT in the last 72 hours.   Recent Labs    10/29/20 0454  NA 138  K 4.2  CL 104  CO2 27  GLUCOSE 107*  BUN 24*  CREATININE 1.06  CALCIUM 8.9    Intake/Output Summary (Last 24 hours) at 10/31/2020 1549 Last data filed at 10/31/2020 1308 Gross per 24 hour  Intake 720 ml  Output 1250 ml  Net -530 ml        Physical Exam: Vital Signs Blood pressure 118/82, pulse 98, temperature 97.9 F (36.6 C), temperature source Oral, resp. rate 18, height 6\' 1"  (1.854 m), weight 80.2 kg, SpO2 94 %. Constitutional: No distress . Vital signs reviewed. HEENT: EOMI, oral membranes moist Neck: supple Cardiovascular: RRR without murmur. No JVD    Respiratory/Chest: CTA Bilaterally without wheezes or rales. Normal effort    GI/Abdomen: BS +, non-tender, non-distended Ext: no clubbing, cyanosis, or edema Psych: pleasant and cooperative  Skin: intact, right hip wound  Neuro: functional insight, memory, and awareness. Speech clear. Normal language. Cranial nerves 2-12 are intact. Sensory exam is normal. Reflexes are 2+ in all 4's. Fine motor coordination is intact. No tremors. Motor function is grossly 5/5 in UE's. RLE 3+/5 d/t pain prox to 4/5 distally. LLE 4-5/5 prox to distal. Improving pain tolerance RLE. Musculoskeletal:  right hip tender with rom, edema decreased. Moving leg more freely now  Assessment/Plan: 1. Functional deficits which require 3+ hours per day of interdisciplinary therapy in a comprehensive inpatient rehab setting. Physiatrist is providing close team  supervision and 24 hour management of active medical problems listed below. Physiatrist and rehab team continue to assess barriers to discharge/monitor patient progress toward functional and medical goals  Care Tool:  Bathing    Body parts bathed by patient: Right arm, Left arm, Chest, Abdomen, Front perineal area, Right upper leg, Left upper leg, Face, Left lower leg, Right lower leg, Buttocks   Body parts bathed by helper: Right lower leg, Left lower leg, Buttocks     Bathing assist Assist Level: Supervision/Verbal cueing     Upper Body Dressing/Undressing Upper body dressing   What is the patient wearing?: Pull over shirt    Upper body assist Assist Level: Set up assist    Lower Body Dressing/Undressing Lower body dressing      What is the patient wearing?: Pants     Lower body assist Assist for lower body dressing: Minimal Assistance - Patient > 75%     Toileting Toileting    Toileting assist Assist for toileting: Supervision/Verbal cueing     Transfers Chair/bed transfer  Transfers assist     Chair/bed transfer assist level: Supervision/Verbal cueing Chair/bed transfer assistive device:   Ambulation assist      Assist level: Contact Guard/Touching assist Assistive device:  Walker-rolling Max distance: 120   Walk 10 feet activity   Assist  Walk 10 feet activity did not occur: Safety/medical concerns (Limited by increased R hip pain)  Assist level: Contact Guard/Touching assist Assistive device: Walker-rolling   Walk 50 feet activity   Assist Walk 50 feet with 2 turns activity did not occur: Safety/medical concerns  Assist level: Contact Guard/Touching assist Assistive device: Walker-rolling    Walk 150 feet activity   Assist Walk 150 feet activity did not occur: Safety/medical concerns  Assist level: Contact Guard/Touching assist Assistive device: Walker-rolling    Walk 10 feet on uneven surface   activity   Assist Walk 10 feet on uneven surfaces activity did not occur: Safety/medical concerns         Wheelchair     Assist Will patient use wheelchair at discharge?: Yes Type of Wheelchair: Manual Wheelchair activity did not occur: Safety/medical concerns (Limited by increased R hip pain during evaluation)  Wheelchair assist level: Supervision/Verbal cueing Max wheelchair distance: 55 ft    Wheelchair 50 feet with 2 turns activity    Assist    Wheelchair 50 feet with 2 turns activity did not occur: Safety/medical concerns   Assist Level: Supervision/Verbal cueing   Wheelchair 150 feet activity     Assist  Wheelchair 150 feet activity did not occur: Safety/medical concerns   Assist Level: Contact Guard/Touching assist   Blood pressure 118/82, pulse 98, temperature 97.9 F (36.6 C), temperature source Oral, resp. rate 18, height 6\' 1"  (1.854 m), weight 80.2 kg, SpO2 94 %.        Medical Problem List and Plan: 1.  TBI with associated SDH. Also suffered right IT hip fracture, T4 compression fx             -patient may not shower             -ELOS: 7/6            -Continue CIR therapies including PT, OT, and SLP      2.  Impaired mobility -DVT/anticoagulation:  Pharmaceutical: Continue Lovenox bid, need to check with ortho how long he will need this so we can provide injection education if warranted.              -antiplatelet therapy: N/A 3. Right IT hip fracture: Schedule tylenol 650mg  TID. LFTs reviewed and wnl. Continue oxycodone/robaxin prn, discussed with team that he is not using much, can consider using prior to therapy to maximize participation.  4. Mood: LCSW to follow for evaluation and support.             -antipsychotic agents: N/A 5. Neuropsych: This patient is not fully capable of making decisions on his own behalf.             --cognition improving. Probably close to baseline    6. Skin/Wound Care: foam dressing to right hip  incision-minimal to no drainage 7. Fluids/Electrolytes/Nutrition: Appetite has been good per wife. -- encourage PO as BUN/Cr elevated 6/27 -BUN/Cr stable to improved on 6/30---continue to push fluids -protein supp for low albumin -recheck BMET Tuesday 8. Right femur fracture: WBAT.              --wounds healing nicely  -removed staples, dc'ed sutures too   9. L-SDH: off Keprra since 6/16  no seizures            10 Giant cell arteritis with visual loss: Was on methotrexate weekly. --Spoke to Dr. Friday 6/16---resumed MTX 6/24. T  bili normalized. Folic acid changed back to 1mg  dose.  11. HR tid--incidental finding years ago and has been controlled.             --To follow up with Card? PCP regarding d/c of Xarelto per notes. 12. Constipation: Continue miralax.  13. Urinary retention: Has failed one voiding trial by Dr. EVO:[id number].             --plans for ultrasound of bladder/prostate? 06/24?             --Continue Flomax at bedtime.  -RN changed foley 6/17---will change out foley again prior to discharge 14. Leucocytosis: Resolving without signs of infection             --encourage pulmonary toilet for likely Right atelectasis. 15. ABLA: Continue to monitor H/H with serial checks and monitor for signs of bleeding. --Monitor hematoma right hip./flank.             -6/15 hgb looks to be trending   up, 7.5 ---> 8.9 6/27  -Continue ferrous gluconate 324mg  MWF. -check labs Tuesday  16. H/o COPD: Stable. Did not like trial of MDI per wife.  17. Vitamin B12 deficiency: Continue Vitamin B12 daily ordered 18. Hypotension: bp's improved. Remains asymptomatic  LOS: 18 days A FACE TO FACE EVALUATION WAS PERFORMED  Thursday 10/31/2020, 3:49 PM

## 2020-10-31 NOTE — Progress Notes (Signed)
Occupational Therapy Session Note  Patient Details  Name: Nathan Mitchell MRN: [id number] Date of Birth: 1931-05-01  Today's Date: 10/31/2020 OT Individual Time: 9470-7615 OT Individual Time Calculation (min): 58 min    Today's Date: 10/31/2020 OT Individual Time: 1440-1518 OT Individual Time Calculation (min): 38 min   Short Term Goals: Week 3:  OT Short Term Goal 1 (Week 3): STG=LTG d/t ELOS  Skilled Therapeutic Interventions/Progress Updates:     Pt received in bed with 4 out of 10 pain in R hip. Heat from shower provided for pain relief  ADL:  Pt completes bathing with with R hip incisions occluded and VC for sequencing bathing body parts Pt completes UB dressing with set up Pt completes LB dressing with MIN A for threading BLE with reacher and VC for doffing/donning strategy Pt completes footwear with S to doff socks in shower and total A to don d/t time constraints Pt completes toileting with VC for pulling pants farther past hips prior to sitting on elevated BSC Pt completes toileting transfer with supervsiion from elevated bed and BSC with good safety awareness/reach back Pt completes shower/Tub transfer with supervision to/from tub bench in shower   Pt left at end of session in bed with exit alarm on, call light in reach and all needs met  Session 2: Pt received in bed with 3 out of 10 pain in hip. rest provided for pain relief  ADL:  Pt completes footwear with MOD A for donning R shoe d/t hip pain and visual deficits impacting AE (shoe horn) use. Pt able to reach down to L foot this date.  Pt completes toileting with S level sit to stand and VC for anterior weight shift into toes in standing Pt completes toileting transfer with S level at ambulatory level   Therapeutic activity Functional mobility in hallway with RW and supervision with w/c follow. Pt completes mobility in kitchen with VC for RW amangement when reaching into cabinets and fridge for safety and  decrease risk of falling in prep for light cooking activity at home. Pt left at end of session in bed with exit alarm on, call light in reach and all needs met    Therapy Documentation Precautions:  Precautions Precautions: Fall Precaution Comments: foley, T4 compression fx (no brace) Restrictions Weight Bearing Restrictions: No RLE Weight Bearing: Weight bearing as tolerated LLE Weight Bearing: Weight bearing as tolerated General:   Vital Signs: Therapy Vitals Temp: 97.7 F (36.5 C) Temp Source: Oral Pulse Rate: 62 Resp: 18 BP: (!) 145/70 Patient Position (if appropriate): Lying Oxygen Therapy SpO2: 98 % O2 Device: Room Air Pain:   ADL: ADL Eating: Supervision/safety Where Assessed-Eating: Chair Grooming: Minimal assistance Upper Body Bathing: Supervision/safety Where Assessed-Upper Body Bathing: Chair Lower Body Bathing: Maximal assistance Where Assessed-Lower Body Bathing: Chair Upper Body Dressing: Contact guard Where Assessed-Upper Body Dressing: Chair Lower Body Dressing: Dependent Where Assessed-Lower Body Dressing: Chair Toileting: Dependent Where Assessed-Toileting: Bedside Commode Toilet Transfer: Dependent Toilet Transfer Method:  (stedy- MOD A power up) Vision   Perception    Praxis   Exercises:   Other Treatments:     Therapy/Group: Individual Therapy  Tonny Branch 10/31/2020, 6:47 AM

## 2020-11-02 DIAGNOSIS — S069X0S Unspecified intracranial injury without loss of consciousness, sequela: Secondary | ICD-10-CM

## 2020-11-02 DIAGNOSIS — S065X9A Traumatic subdural hemorrhage with loss of consciousness of unspecified duration, initial encounter: Secondary | ICD-10-CM

## 2020-11-02 NOTE — Progress Notes (Signed)
Occupational Therapy Discharge Summary  Patient Details  Name: Nathan Mitchell MRN: [id number] Date of Birth: [birth year]   Patient has met 12 of 12 long term goals due to improved activity tolerance, improved balance, postural control, ability to compensate for deficits, improved attention, improved awareness, and improved coordination.  Patient to discharge at overall Supervision level.  Patient's care partner is independent to provide the necessary cognitive assistance at discharge. His wife has also hired a 24/7 caregiver to assist with supervision level care. His family has attended family education and feels comfortable with providing physical and cognitive assist required.   Reasons goals not met: All treatment goals met.   Recommendation:  Patient will benefit from ongoing skilled OT services in home health setting to continue to advance functional skills in the area of BADL.  Equipment: BSC  Reasons for discharge: treatment goals met and discharge from hospital  Patient/family agrees with progress made and goals achieved: Yes  OT Discharge Precautions/Restrictions  Precautions Precautions: Fall Precaution Comments: foley, T4 compression fx (no brace) Restrictions Weight Bearing Restrictions: No RLE Weight Bearing: Weight bearing as tolerated LLE Weight Bearing: Weight bearing as tolerated  Pain Pain Assessment Pain Scale: 0-10 Pain Score: 5  Faces Pain Scale: No hurt Pain Type: Acute pain Pain Location: Hip Pain Orientation: Right Pain Descriptors / Indicators: Aching Pain Frequency: Constant Pain Onset: On-going Patients Stated Pain Goal: 1 Pain Intervention(s): Medication (See eMAR) ADL ADL Eating: Supervision/safety Where Assessed-Eating: Chair Grooming: Minimal assistance Upper Body Bathing: Supervision/safety Where Assessed-Upper Body Bathing: Chair Lower Body Bathing: Maximal assistance Where Assessed-Lower Body Bathing: Chair Upper Body  Dressing: Contact guard Where Assessed-Upper Body Dressing: Chair Lower Body Dressing: Dependent Where Assessed-Lower Body Dressing: Chair Toileting: Dependent Where Assessed-Toileting: Bedside Commode Toilet Transfer: Dependent Toilet Transfer Method:  (stedy- MOD A power up) Vision Baseline Vision/History: Legally blind Patient Visual Report: No change from baseline Vision Assessment?: No apparent visual deficits (no NEW visual deficits) Perception  Perception: Impaired Inattention/Neglect: Impaired-to be further tested in functional context (improved, still some slight L inattention) Praxis Praxis: Impaired Praxis Impairment Details: Motor planning Praxis-Other Comments: still some motor planning deficits Cognition Overall Cognitive Status: Impaired/Different from baseline Arousal/Alertness: Awake/alert Orientation Level: Oriented X4 Attention: Selective Selective Attention: Appears intact Memory: Impaired Memory Impairment: Decreased recall of new information Decreased Short Term Memory: Verbal basic;Functional basic Awareness: Impaired Awareness Impairment: Anticipatory impairment Problem Solving: Impaired Problem Solving Impairment: Verbal basic;Functional basic Safety/Judgment: Impaired [employer] Scales of Cognitive Functioning: Purposeful/appropriate Sensation Sensation Light Touch: Impaired Detail Central sensation comments: Peripheral neuropathy- affects distal toes only in terms of sensation Coordination Gross Motor Movements are Fluid and Coordinated: No Fine Motor Movements are Fluid and Coordinated: Yes Coordination and Movement Description: pain guarding with R LE movements, generalized weakness Motor  Motor Motor: Abnormal postural alignment and control Motor - Discharge Observations: generalized weakness Mobility  Bed Mobility Bed Mobility: Supine to Sit;Sit to Supine Supine to Sit: Supervision/Verbal cueing Sit to Supine: Supervision/Verbal  cueing Transfers Sit to Stand: Supervision/Verbal cueing Stand to Sit: Supervision/Verbal cueing  Trunk/Postural Assessment  Cervical Assessment Cervical Assessment: Exceptions to [facility] (forward head) Thoracic Assessment Thoracic Assessment: Exceptions to [facility] (rounded shoulders) Lumbar Assessment Lumbar Assessment: Exceptions to [facility] (posterior pelvic tilt) Postural Control Postural Control: Deficits on evaluation (delayed)  Balance Balance Balance Assessed: Yes Static Sitting Balance Static Sitting - Level of Assistance: 6: Modified independent (Device/Increase time) Dynamic Sitting Balance Dynamic Sitting - Level of Assistance: 5: Stand by assistance Static Standing Balance  Static Standing - Level of Assistance: 5: Stand by assistance Dynamic Standing Balance Dynamic Standing - Balance Support: During functional activity;Bilateral upper extremity supported Dynamic Standing - Level of Assistance: 5: Stand by assistance Extremity/Trunk Assessment RUE Assessment RUE Assessment: Within Functional Limits LUE Assessment LUE Assessment: Within Functional Limits   Curtis Sites 11/02/2020, 3:10 PM

## 2020-11-02 NOTE — Progress Notes (Signed)
PROGRESS NOTE   Subjective/Complaints:   No issues overnite , pt participated with PT this am , Right hip pain with movement   ROS: Patient denies CP, SOB, N/V/D    Objective:   No results found. No results for input(s): WBC, HGB, HCT, PLT in the last 72 hours.   No results for input(s): NA, K, CL, CO2, GLUCOSE, BUN, CREATININE, CALCIUM in the last 72 hours.   Intake/Output Summary (Last 24 hours) at 11/02/2020 1032 Last data filed at 11/02/2020 0900 Gross per 24 hour  Intake 520 ml  Output 1051 ml  Net -531 ml         Physical Exam: Vital Signs Blood pressure 127/73, pulse 81, temperature 97.9 F (36.6 C), temperature source Oral, resp. rate 18, height 6\' 1"  (1.854 m), weight 80.6 kg, SpO2 98 %.  General: No acute distress Mood and affect are appropriate Heart: Regular rate and rhythm no rubs murmurs or extra sounds Lungs: Clear to auscultation, breathing unlabored, no rales or wheezes Abdomen: Positive bowel sounds, soft nontender to palpation, nondistended Extremities: No clubbing, cyanosis, or edema Skin: Ecchymosis Right distal medial thigh    Neuro: functional insight, memory, and awareness. Speech clear. Normal language. Cranial nerves 2-12 are intact. Sensory exam is normal. Reflexes are 2+ in all 4's. Fine motor coordination is intact. No tremors. Motor function is grossly 5/5 in UE's. RLE 3+/5 d/t pain prox to 4/5 distally. LLE 4-5/5 prox to distal. Improving pain tolerance RLE. Musculoskeletal:  right hip tender with rom, edema decreased. Moving leg more freely now  Assessment/Plan: 1. Functional deficits which require 3+ hours per day of interdisciplinary therapy in a comprehensive inpatient rehab setting. Physiatrist is providing close team supervision and 24 hour management of active medical problems listed below. Physiatrist and rehab team continue to assess barriers to discharge/monitor patient  progress toward functional and medical goals  Care Tool:  Bathing    Body parts bathed by patient: Right arm, Left arm, Chest, Abdomen, Front perineal area, Right upper leg, Left upper leg, Face, Left lower leg, Right lower leg, Buttocks   Body parts bathed by helper: Right lower leg, Left lower leg, Buttocks     Bathing assist Assist Level: Supervision/Verbal cueing     Upper Body Dressing/Undressing Upper body dressing   What is the patient wearing?: Pull over shirt    Upper body assist Assist Level: Set up assist    Lower Body Dressing/Undressing Lower body dressing      What is the patient wearing?: Pants     Lower body assist Assist for lower body dressing: Minimal Assistance - Patient > 75%     Toileting Toileting    Toileting assist Assist for toileting: Supervision/Verbal cueing     Transfers Chair/bed transfer  Transfers assist     Chair/bed transfer assist level: Supervision/Verbal cueing Chair/bed transfer assistive device:   Ambulation assist      Assist level: Contact Guard/Touching assist Assistive device: Walker-rolling Max distance: 120   Walk 10 feet activity   Assist  Walk 10 feet activity did not occur: Safety/medical concerns (Limited by increased R hip pain)  Assist level: Contact  Guard/Touching assist Assistive device: Walker-rolling   Walk 50 feet activity   Assist Walk 50 feet with 2 turns activity did not occur: Safety/medical concerns  Assist level: Contact Guard/Touching assist Assistive device: Walker-rolling    Walk 150 feet activity   Assist Walk 150 feet activity did not occur: Safety/medical concerns  Assist level: Contact Guard/Touching assist Assistive device: Walker-rolling    Walk 10 feet on uneven surface  activity   Assist Walk 10 feet on uneven surfaces activity did not occur: Safety/medical concerns         Wheelchair     Assist Will patient use  wheelchair at discharge?: Yes Type of Wheelchair: Manual Wheelchair activity did not occur: Safety/medical concerns (Limited by increased R hip pain during evaluation)  Wheelchair assist level: Supervision/Verbal cueing Max wheelchair distance: 55 ft    Wheelchair 50 feet with 2 turns activity    Assist    Wheelchair 50 feet with 2 turns activity did not occur: Safety/medical concerns   Assist Level: Supervision/Verbal cueing   Wheelchair 150 feet activity     Assist  Wheelchair 150 feet activity did not occur: Safety/medical concerns   Assist Level: Contact Guard/Touching assist   Blood pressure 127/73, pulse 81, temperature 97.9 F (36.6 C), temperature source Oral, resp. rate 18, height 6\' 1"  (1.854 m), weight 80.6 kg, SpO2 98 %.        Medical Problem List and Plan: 1.  TBI with associated SDH. Also suffered right IT hip fracture, T4 compression fx             -patient may not shower             -ELOS: 7/6            -Continue CIR therapies including PT, OT, and SLP      2.  Impaired mobility -DVT/anticoagulation:  Pharmaceutical: Continue Lovenox bid, need to check with ortho how long he will need this so we can provide injection education if warranted.              -antiplatelet therapy: N/A 3. Right IT hip fracture: Schedule tylenol 650mg  TID. LFTs reviewed and wnl. Continue oxycodone/robaxin prn, discussed with team that he is not using much, can consider using prior to therapy to maximize participation.  4. Mood: LCSW to follow for evaluation and support.             -antipsychotic agents: N/A 5. Neuropsych: This patient is not fully capable of making decisions on his own behalf.             --cognition improving. Probably close to baseline    6. Skin/Wound Care: foam dressing to right hip incision-minimal to no drainage 7. Fluids/Electrolytes/Nutrition: Appetite has been good per wife. -- encourage PO as BUN/Cr elevated 6/27 -BUN/Cr stable to improved  on 6/30---continue to push fluids -protein supp for low albumin -recheck BMET Tuesday 8. Right femur fracture: WBAT.              --wounds healing nicely  -removed staples, dc'ed sutures too   9. L-SDH: off Keprra since 6/16  no seizures            10 Giant cell arteritis with visual loss: Was on methotrexate weekly. --Spoke to Dr. Wednesday 6/16---resumed MTX 6/24. T bili normalized. Folic acid changed back to 1mg  dose.  11. Nickola Major HR tid--incidental finding years ago and has been controlled.             --  To follow up with Card? PCP regarding d/c of Xarelto per notes. 12. Constipation: Continue miralax.  13. Urinary retention: Has failed one voiding trial by Dr. Cardell Peach.             --plans for ultrasound of bladder/prostate? 06/24?             --Continue Flomax at bedtime.  -RN changed foley 6/17---will change out foley again prior to discharge 14. Leucocytosis: Resolving without signs of infection             --encourage pulmonary toilet for likely Right atelectasis. 15. ABLA: Continue to monitor H/H with serial checks and monitor for signs of bleeding. --Monitor hematoma right hip./flank.             -6/15 hgb looks to be trending   up, 7.5 ---> 8.9 6/27  -Continue ferrous gluconate 324mg  MWF. -check labs Tuesday  16. H/o COPD: Stable. Did not like trial of MDI per wife.  17. Vitamin B12 deficiency: Continue Vitamin B12 Wednesday daily ordered 18. Hypotension: Resolved bp's improved. Remains asymptomatic Vitals:   11/01/20 1922 11/02/20 0415  BP: 108/64 127/73  Pulse: 73 81  Resp: 18 18  Temp: 97.9 F (36.6 C)   SpO2: 95% 98%     LOS: 20 days A FACE TO FACE EVALUATION WAS PERFORMED  01/03/21 11/02/2020, 10:32 AM

## 2020-11-02 NOTE — Progress Notes (Signed)
Occupational Therapy TBI Note  Patient Details  Name: Nathan Mitchell MRN: [id number] Date of Birth: 10-15-1930  Today's Date: 11/02/2020 OT Individual Time: 1300-1415 OT Individual Time Calculation (min): 75 min    Short Term Goals: Week 3:  OT Short Term Goal 1 (Week 3): STG=LTG d/t ELOS  Skilled Therapeutic Interventions/Progress Updates:    Pt received sitting in the recliner with 6/10 pain in his hip. Pt requesting to take shower. Pt's wife was present for session and significant time was spent providing d/c education and planning home accessibility and providing strategies for fall risk reduction. Pt completed transfer at close supervision level. He required intermittent cueing for remembering to keep a hand on the RW during ADLs for balance support. He completed toileting tasks at close supervision level. Shower at supervision as well. Pt donned shirt with set up assist and min cueing for orientating it correctly. With set up of reacher pt was able to use reacher with supervision today! He completed sit > stand with (S) and pulled up pants, requiring assist only to thread foley bag through shorts. He completed oral care and grooming tasks at the sink with set up assist. 120 ft of functional mobility with 2 standing rest breaks completed to increase dynamic standing balance and functional activity tolerance. Pt was left sitting up with all needs met, chair alarm set.   Therapy Documentation Precautions:  Precautions Precautions: Fall Precaution Comments: foley, T4 compression fx (no brace) Restrictions Weight Bearing Restrictions: No RLE Weight Bearing: Weight bearing as tolerated LLE Weight Bearing: Weight bearing as tolerated  Agitated Behavior Scale: TBI Observation Details Observation Environment: CIR Start of observation period - Date: 11/02/20 Start of observation period - Time: 1300 End of observation period - Date: 11/02/20 End of observation period - Time:  1415 Agitated Behavior Scale (DO NOT LEAVE BLANKS) Short attention span, easy distractibility, inability to concentrate: Absent Impulsive, impatient, low tolerance for pain or frustration: Absent Uncooperative, resistant to care, demanding: Absent Violent and/or threatening violence toward people or property: Absent Explosive and/or unpredictable anger: Absent Rocking, rubbing, moaning, or other self-stimulating behavior: Absent Pulling at tubes, restraints, etc.: Absent Wandering from treatment areas: Absent Restlessness, pacing, excessive movement: Absent Repetitive behaviors, motor, and/or verbal: Absent Rapid, loud, or excessive talking: Absent Sudden changes of mood: Absent Easily initiated or excessive crying and/or laughter: Absent Self-abusiveness, physical and/or verbal: Absent Agitated behavior scale total score: 14    Therapy/Group: Individual Therapy  Curtis Sites 11/02/2020, 1:32 PM

## 2020-11-02 NOTE — Progress Notes (Signed)
Physical Therapy TBI Note  Patient Details  Name: Nathan Mitchell MRN: [id number] Date of Birth: 10-11-1930  Today's Date: 11/02/2020 PT Individual Time: 3419-6222 PT Individual Time Calculation (min): 55 min   Short Term Goals: Week 3:  PT Short Term Goal 1 (Week 3): STG=LTG due to ELOS.  Skilled Therapeutic Interventions/Progress Updates:    Pt received seated in w/c in room, agreeable to PT session. Pt reports pain in R hip with mobility, not rated and pain improves at rest. Sit to stand with CGA and RW throughout session. Ambulation x 180 ft with RW and CGA, antalgic gait pattern and decreased tolerance for stance on RLE at times. Standing BLE strengthening therex x 10 reps each: marches, hip abd, HS curls, 4" step-taps with RW and CGA for balance. Standing balance performing ball toss against rebounder with no UE support and min A for balance, 2 x 15 reps. Pt left seated in w/c in room with needs in reach, quick release belt in place at end of session.  Therapy Documentation Precautions:  Precautions Precautions: Fall Precaution Comments: foley, T4 compression fx (no brace) Restrictions Weight Bearing Restrictions: No RLE Weight Bearing: Weight bearing as tolerated LLE Weight Bearing: Weight bearing as tolerated  Agitated Behavior Scale: TBI Observation Details Observation Environment: patient room; therapy gym Start of observation period - Date: 11/02/20 Start of observation period - Time: 1600 End of observation period - Date: 11/02/20 End of observation period - Time: 1655 Agitated Behavior Scale (DO NOT LEAVE BLANKS) Short attention span, easy distractibility, inability to concentrate: Absent Impulsive, impatient, low tolerance for pain or frustration: Absent Uncooperative, resistant to care, demanding: Absent Violent and/or threatening violence toward people or property: Absent Explosive and/or unpredictable anger: Absent Rocking, rubbing, moaning, or other  self-stimulating behavior: Absent Pulling at tubes, restraints, etc.: Absent Wandering from treatment areas: Absent Restlessness, pacing, excessive movement: Absent Repetitive behaviors, motor, and/or verbal: Absent Rapid, loud, or excessive talking: Absent Sudden changes of mood: Absent Easily initiated or excessive crying and/or laughter: Absent Self-abusiveness, physical and/or verbal: Absent Agitated behavior scale total score: 14     Therapy/Group: Individual Therapy  Peter Congo, PT, DPT, CSRS  11/02/2020, 5:06 PM

## 2020-11-03 ENCOUNTER — Telehealth: Payer: Self-pay

## 2020-11-03 DIAGNOSIS — R339 Retention of urine, unspecified: Secondary | ICD-10-CM

## 2020-11-03 LAB — CBC
HCT: 29.7 % — ABNORMAL LOW (ref 39.0–52.0)
Hemoglobin: 9 g/dL — ABNORMAL LOW (ref 13.0–17.0)
MCH: 28.8 pg (ref 26.0–34.0)
MCHC: 30.3 g/dL (ref 30.0–36.0)
MCV: 94.9 fL (ref 80.0–100.0)
Platelets: 248 10*3/uL (ref 150–400)
RBC: 3.13 MIL/uL — ABNORMAL LOW (ref 4.22–5.81)
RDW: 16.7 % — ABNORMAL HIGH (ref 11.5–15.5)
WBC: 7.2 10*3/uL (ref 4.0–10.5)
nRBC: 0 % (ref 0.0–0.2)

## 2020-11-03 LAB — BASIC METABOLIC PANEL
Anion gap: 5 (ref 5–15)
BUN: 24 mg/dL — ABNORMAL HIGH (ref 8–23)
CO2: 26 mmol/L (ref 22–32)
Calcium: 8.8 mg/dL — ABNORMAL LOW (ref 8.9–10.3)
Chloride: 105 mmol/L (ref 98–111)
Creatinine, Ser: 0.97 mg/dL (ref 0.61–1.24)
GFR, Estimated: >60 mL/min (ref >60)
Glucose, Bld: 110 mg/dL — ABNORMAL HIGH (ref 70–99)
Potassium: 4 mmol/L (ref 3.5–5.1)
Sodium: 136 mmol/L (ref 135–145)

## 2020-11-03 NOTE — Progress Notes (Signed)
PROGRESS NOTE   Subjective/Complaints: Fussing a little bit about his right thigh being sore but overall doing well. Excited to be going home tomorrow  ROS: Patient denies fever, rash, sore throat, blurred vision, nausea, vomiting, diarrhea, cough, shortness of breath or chest pain,  headache, or mood change. .    Objective:   No results found. Recent Labs    11/03/20 0020  WBC 7.2  HGB 9.0*  HCT 29.7*  PLT 248     Recent Labs    11/03/20 0020  NA 136  K 4.0  CL 105  CO2 26  GLUCOSE 110*  BUN 24*  CREATININE 0.97  CALCIUM 8.8*    Intake/Output Summary (Last 24 hours) at 11/03/2020 1339 Last data filed at 11/03/2020 1259 Gross per 24 hour  Intake 660 ml  Output 2700 ml  Net -2040 ml        Physical Exam: Vital Signs Blood pressure (!) 158/77, pulse 90, temperature 97.7 F (36.5 C), temperature source Oral, resp. rate 18, height 6\' 1"  (1.854 m), weight 80.7 kg, SpO2 93 %. Constitutional: No distress . Vital signs reviewed. HEENT: EOMI, oral membranes moist Neck: supple Cardiovascular: RRR without murmur. No JVD    Respiratory/Chest: CTA Bilaterally without wheezes or rales. Normal effort    GI/Abdomen: BS +, non-tender, non-distended Ext: no clubbing, cyanosis, or edema Psych: pleasant and cooperative  Skin: intact, right hip wound  Neuro: functional insight, memory, and awareness. Speech clear. Normal language. Cranial nerves 2-12 are intact. Sensory exam is normal. Reflexes are 2+ in all 4's. Fine motor coordination is intact. No tremors. Motor function is grossly 5/5 in UE's. RLE 3+/5 d/t pain prox to 4/5 distally. LLE 4-5/5 prox to distal. Improving pain tolerance RLE. Musculoskeletal:  mild right thigh tenderness.  Assessment/Plan: 1. Functional deficits which require 3+ hours per day of interdisciplinary therapy in a comprehensive inpatient rehab setting. Physiatrist is providing close team  supervision and 24 hour management of active medical problems listed below. Physiatrist and rehab team continue to assess barriers to discharge/monitor patient progress toward functional and medical goals  Care Tool:  Bathing    Body parts bathed by patient: Right arm, Left arm, Chest, Abdomen, Front perineal area, Right upper leg, Left upper leg, Face, Left lower leg, Right lower leg, Buttocks   Body parts bathed by helper: Right lower leg, Left lower leg, Buttocks     Bathing assist Assist Level: Supervision/Verbal cueing     Upper Body Dressing/Undressing Upper body dressing   What is the patient wearing?: Pull over shirt    Upper body assist Assist Level: Set up assist    Lower Body Dressing/Undressing Lower body dressing      What is the patient wearing?: Pants     Lower body assist Assist for lower body dressing: Contact Guard/Touching assist     Toileting Toileting    Toileting assist Assist for toileting: Supervision/Verbal cueing     Transfers Chair/bed transfer  Transfers assist     Chair/bed transfer assist level: Supervision/Verbal cueing Chair/bed transfer assistive device:   Ambulation assist      Assist level: Contact Guard/Touching assist Assistive  device: Walker-rolling Max distance: 175   Walk 10 feet activity   Assist  Walk 10 feet activity did not occur: Safety/medical concerns (Limited by increased R hip pain)  Assist level: Supervision/Verbal cueing Assistive device: Walker-rolling   Walk 50 feet activity   Assist Walk 50 feet with 2 turns activity did not occur: Safety/medical concerns  Assist level: Contact Guard/Touching assist Assistive device: Walker-rolling    Walk 150 feet activity   Assist Walk 150 feet activity did not occur: Safety/medical concerns  Assist level: Contact Guard/Touching assist Assistive device: Walker-rolling    Walk 10 feet on uneven surface   activity   Assist Walk 10 feet on uneven surfaces activity did not occur: Safety/medical concerns         Wheelchair     Assist Will patient use wheelchair at discharge?: Yes Type of Wheelchair: Manual Wheelchair activity did not occur: Safety/medical concerns (Limited by increased R hip pain during evaluation)  Wheelchair assist level: Supervision/Verbal cueing Max wheelchair distance: 55 ft    Wheelchair 50 feet with 2 turns activity    Assist    Wheelchair 50 feet with 2 turns activity did not occur: Safety/medical concerns   Assist Level: Supervision/Verbal cueing   Wheelchair 150 feet activity     Assist  Wheelchair 150 feet activity did not occur: Safety/medical concerns   Assist Level: Contact Guard/Touching assist   Blood pressure (!) 158/77, pulse 90, temperature 97.7 F (36.5 C), temperature source Oral, resp. rate 18, height 6\' 1"  (1.854 m), weight 80.7 kg, SpO2 93 %.        Medical Problem List and Plan: 1.  TBI with associated SDH. Also suffered right IT hip fracture, T4 compression fx             -patient may not shower             -ELOS: 7/6           -Continue CIR therapies including PT, OT, and SLP       2.  Impaired mobility -DVT/anticoagulation:  Pharmaceutical: Continue Lovenox bid, need to check with ortho how long he will need this so we can provide injection education if warranted.              -antiplatelet therapy: N/A 3. Right IT hip fracture: Schedule tylenol 650mg  TID. LFTs reviewed and wnl. Continue oxycodone/robaxin prn, discussed with team that he is not using much, can consider using prior to therapy to maximize participation.  4. Mood: LCSW to follow for evaluation and support.             -antipsychotic agents: N/A 5. Neuropsych: This patient is not fully capable of making decisions on his own behalf.             --cognition improving. Probably close to baseline    6. Skin/Wound Care: foam dressing to right hip  incision-minimal to no drainage 7. Fluids/Electrolytes/Nutrition: Appetite has been good per wife. -- encourage PO as BUN/Cr elevated 6/27 -BUN/Cr stable 7/5 8. Right femur fracture: WBAT.              --wounds healing nicely  -removed staples and sutures   9. L-SDH: off Keprra since 6/16  no seizures            10 Giant cell arteritis with visual loss: Was on methotrexate weekly. --Spoke to Dr. 7/27 6/16---resumed MTX 6/24. T bili normalized. Folic acid changed back to 1mg  dose.  11. Nickola Major HR tid--incidental  finding years ago and has been controlled.             --To follow up with Card? PCP regarding d/c of Xarelto per notes. 12. Constipation: Continue miralax.  13. Urinary retention: Has failed one voiding trial by Dr. Cardell Peach.             --plans for ultrasound of bladder/prostate? 06/24?             --Continue Flomax at bedtime.  -RN changed foley 6/17---change again today 7/5 14. Leucocytosis: Resolving without signs of infection             --encourage pulmonary toilet for likely Right atelectasis. 15. ABLA: Continue to monitor H/H with serial checks and monitor for signs of bleeding. --hgb 8.9--> 9.0. no gross signs of bleeding 16. H/o COPD: Stable. Did not like trial of MDI per wife.  17. Vitamin B12 deficiency: Continue Vitamin B12 daily ordered 18. Hypotension: bp's improved. Remains asymptomatic  LOS: 21 days A FACE TO FACE EVALUATION WAS PERFORMED  Ranelle Oyster 11/03/2020, 1:39 PM

## 2020-11-03 NOTE — Patient Care Conference (Signed)
Inpatient RehabilitationTeam Conference and Plan of Care Update Date: 11/03/2020   Time: 10:13 AM    Patient Name: Nathan Mitchell      Medical Record Number: [id number]  Date of Birth: 01-May-1931 Sex: Male         Room/Bed: 4W26C/4W26C-01 Payor Info: Payor: [employer] MEDICARE / Plan: [employer] MEDICARE CHOICE PPO / Product Type: *No Product type* /    Admit Date/Time:  10/13/2020  4:33 PM  Primary Diagnosis:  TBI (traumatic brain injury) [facility])  Hospital Problems: Principal Problem:   TBI (traumatic brain injury) [facility])    Expected Discharge Date: Expected Discharge Date: 11/04/20  Team Members Present: Physician leading conference: Dr. Alger Simons Care Coodinator Present: Erlene Quan, BSW;Aiman Sonn Creig Hines, RN, BSN, [place] Nurse Present: Dorthula Nettles, RN PT Present: Apolinar Junes, PT OT Present: Other (comment) Paulette Blanch, OT) SLP Present: Weston Anna, SLP PPS Coordinator present : Gunnar Fusi, SLP     Current Status/Progress Goal Weekly Team Focus  Bowel/Bladder   Pateint hx chronic foley cath., LBM 7/4/ 22, continue schedule and prn meds         Swallow/Nutrition/ Hydration             ADL's   Supervision UB ADLs, min A LB d/t hip ROM limitations and visual deficits making AE use difficult. Supervision transfers  supervision overall  d/c planning, family edu, ADLs, transfers, AE use   Mobility   Supervision overall, CGA step and car transfer  Supervision overall, patient is at goal level  D/c planning, patient/caregiver education, HEP   Communication             Safety/Cognition/ Behavioral Observations            Pain   Pain is deceasing, discomfort mostly during therapy will need to pre-medicate prior, has prn meds  pain < or = 3 on pain scale, pain is controlled  Continue to assess and reassess prior to and after therapy sessions for medication or repositioning for comfort   Skin   S/P right hip fracture healing slowly and improving,with  therapy, foam dressing x3, no drainage to area,ecchymosis area with tenderness right thigh, areas to arm from lab draw l  Patient will remain free of infection  Assess pain QS, prior to and after therapy and prn, evaulate medication effectiveness     Discharge Planning:  D/c tohome with 24/7 care from his wife. Dtrs to assist as needed, and private aide to begin on 7/6 with 24/7 care. Fam edu completed on 7/1 10am-12pm/1pm-3pm. [facility] for HHPT/OT/SN and DME ordered: 3in1 BSC and transport chair with [employer].   Team Discussion: Medically ready for discharge. Has chronic foley, will change today, continent of bowel. Pain controlled with Tylenol. Right hip incision is CDI. Patient on target to meet rehab goals: yes, supervision goals are met. Contact guard for some outdoor activities. SLP discharged patient last week.  *See Care Plan and progress notes for long and short-term goals.   Revisions to Treatment Plan:  Medically ready for discharge.  Teaching Needs: Education complete.  Current Barriers to Discharge: Decreased caregiver support, Medical stability, Home enviroment access/layout, Lack of/limited family support, Weight bearing restrictions, Medication compliance, and Behavior  Possible Resolutions to Barriers: Continue current medications, provide emotional support.     Medical Summary Current Status: right leg pain improving. wounds almost healed. eating well. bp controlled. continues with foley---change out today  Barriers to Discharge: Medical stability   Possible Resolutions to [employer] Focus: finalize  meds and dc plan for tomorrow   Continued Need for Acute Rehabilitation Level of Care: The patient requires daily medical management by a physician with specialized training in physical medicine and rehabilitation for the following reasons: Direction of a multidisciplinary physical rehabilitation program to maximize functional independence : Yes Medical  management of patient stability for increased activity during participation in an intensive rehabilitation regime.: Yes Analysis of laboratory values and/or radiology reports with any subsequent need for medication adjustment and/or medical intervention. : Yes   I attest that I was present, lead the team conference, and concur with the assessment and plan of the team.   ,  G 11/03/2020, 3:07 PM        

## 2020-11-03 NOTE — Progress Notes (Signed)
Patient ID: Nathan Mitchell, male   DOB: 02-20-1931, 87 y.o.   MRN: [id number] Team Conference Report to Patient/Family  Team Conference discussion was reviewed with the patient and caregiver, including goals, any changes in plan of care and target discharge date.  Patient and caregiver express understanding and are in agreement.  The patient has a target discharge date of 11/04/20.  SW made 2x attempts to call pt spouse. No answer, left VM  [name] 11/03/2020, 3:23 PM

## 2020-11-03 NOTE — Progress Notes (Signed)
, °  Occupational Therapy TBI Note  Patient Details  Name: Nathan Mitchell MRN: [id number] Date of Birth: 26-Mar-1931  Today's Date: 11/03/2020 OT Individual Time: 1259-1356 OT Individual Time Calculation (min): 57 min    Short Term Goals: Week 1:  OT Short Term Goal 1 (Week 1): Pt will stand pivot transfer to BSC/toilet wiht MOD A consistently OT Short Term Goal 1 - Progress (Week 1): Met OT Short Term Goal 2 (Week 1): Pt will thread 1LE into pants OT Short Term Goal 2 - Progress (Week 1): Progressing toward goal OT Short Term Goal 3 (Week 1): Pt will groom wiht S OT Short Term Goal 3 - Progress (Week 1): Met  Skilled Therapeutic Interventions/Progress Updates:    Pt received in room in bed and consented to OT tx. Pt transferred from EOB to stand with RW and walked to sit in w/c with CGA. Session focused on BUE strengthening HEP to increase strength and activity tolerance for ADLs and functional transfers. Pt trained in 3# bd unilateral exercises including elbow flexion and shoulder press, 2#db exercises including chest press, shoulder flexion, and shoulder abduction for 3x15 with min cuing for proper technique with good carryover.  Pt then instructed in lime green theraband exercises including tricep extension and chest pull for 3x15. Pt required frequent rest breaks due to fatigue. After tx, pt helped back to room and left up in wheelchair with all needs met.   Therapy Documentation Precautions:  Precautions Precautions: Fall Precaution Comments: foley, T4 compression fx (no brace) Restrictions Weight Bearing Restrictions: No RLE Weight Bearing: Weight bearing as tolerated LLE Weight Bearing: Weight bearing as tolerated  Pain: Pain Assessment Pain Scale: 0-10 Pain Score: 0-No pain Faces Pain Scale: No hurt Agitated Behavior Scale: TBI Observation Details Observation Environment: CIR Start of observation period - Date: 11/03/20 Start of observation period - Time: 1259 End of  observation period - Date: 11/03/20 End of observation period - Time: 1356 Agitated Behavior Scale (DO NOT LEAVE BLANKS) Short attention span, easy distractibility, inability to concentrate: Absent Impulsive, impatient, low tolerance for pain or frustration: Absent Uncooperative, resistant to care, demanding: Absent Violent and/or threatening violence toward people or property: Absent Explosive and/or unpredictable anger: Absent Rocking, rubbing, moaning, or other self-stimulating behavior: Absent Pulling at tubes, restraints, etc.: Absent Wandering from treatment areas: Absent Restlessness, pacing, excessive movement: Absent Repetitive behaviors, motor, and/or verbal: Absent Rapid, loud, or excessive talking: Absent Sudden changes of mood: Absent Easily initiated or excessive crying and/or laughter: Absent Self-abusiveness, physical and/or verbal: Absent Agitated behavior scale total score: 14     Therapy/Group: Individual Therapy  Lechelle Wrigley 11/03/2020, 1:58 PM

## 2020-11-03 NOTE — Telephone Encounter (Signed)
Pam love a PA at the hospital wanted to know how long you felt the pt needs to be on a anticoagulant. Please advise   Cb # [id number]

## 2020-11-03 NOTE — Progress Notes (Signed)
Physical Therapy TBI Note  Patient Details  Name: Nathan Mitchell MRN: [id number] Date of Birth: 09/07/1930  Today's Date: 11/03/2020 PT Individual Time: 1435-1500 PT Individual Time Calculation (min): 25 min   Short Term Goals: Week 3:  PT Short Term Goal 1 (Week 3): STG=LTG due to ELOS.  Skilled Therapeutic Interventions/Progress Updates:     Pt received seated in [facility] and agrees to therapy. No complaint of pain. WC transport to gym for time management. PT provides pt with printed HEP. Initially PT provides demonstration of each exercise, with cues on optimal body mechanics and performance. Pt performs multiple reps of sit to stand with RW and close supervision. Pt performs 1x10 standing marches, standing hip abduction, and hip extension. Pt then perform stand pivot transfer to mat table with CGA. Sit to supine with cues on logrolling. Pt performs 1x10 heel slides and supine hip abduction with cues for hip rotation for optimal performance. Supine to sit with cueing. Pt performs stand pivot from mat>WC>bed with CGA. Left supine with alarm intact and all needs within reach.  Therapy Documentation Precautions:  Precautions Precautions: Fall Precaution Comments: foley, T4 compression fx (no brace) Restrictions Weight Bearing Restrictions: No RLE Weight Bearing: Weight bearing as tolerated LLE Weight Bearing: Weight bearing as tolerated Agitated Behavior Scale: TBI Observation Details Observation Environment: CIR Start of observation period - Date: 11/03/20 Start of observation period - Time: 1259 End of observation period - Date: 11/03/20 End of observation period - Time: 1356 Agitated Behavior Scale (DO NOT LEAVE BLANKS) Short attention span, easy distractibility, inability to concentrate: Absent Impulsive, impatient, low tolerance for pain or frustration: Absent Uncooperative, resistant to care, demanding: Absent Violent and/or threatening violence toward people or property:  Absent Explosive and/or unpredictable anger: Absent Rocking, rubbing, moaning, or other self-stimulating behavior: Absent Pulling at tubes, restraints, etc.: Absent Wandering from treatment areas: Absent Restlessness, pacing, excessive movement: Absent Repetitive behaviors, motor, and/or verbal: Absent Rapid, loud, or excessive talking: Absent Sudden changes of mood: Absent Easily initiated or excessive crying and/or laughter: Absent Self-abusiveness, physical and/or verbal: Absent Agitated behavior scale total score: 14    Therapy/Group: Individual Therapy  Beau Fanny, PT, DPT 11/03/2020, 3:44 PM

## 2020-11-03 NOTE — Discharge Summary (Signed)
Physician Discharge Summary  Patient ID: Nathan Mitchell MRN: [id number] DOB/AGE: 12-26-1930 87 y.o.  Admit date: 10/13/2020 Discharge date: 11/04/2020  Discharge Diagnoses:  Principal Problem:   TBI (traumatic brain injury) [facility]) Active Problems:   Closed right hip fracture (HCC)   Atrial fibrillation (HCC)   Other giant cell arteritis (HCC)   Vision loss   Subdural hematoma (HCC)   Urinary retention   Discharged Condition: good  Significant Diagnostic Studies: DG FEMUR PORT, MIN 2 VIEWS RIGHT  Result Date: 10/24/2020 CLINICAL DATA:  Right intramedullary nail placement. EXAM: RIGHT FEMUR PORTABLE 2 VIEW COMPARISON:  October 07, 2020 FINDINGS: Rods have been placed across the right intertrochanteric fracture. Gamma nails and the femoral intramedullary rod are in good position. IMPRESSION: Repair of right intertrochanteric fracture as above. Hardware is in good position. Alignment is improved. Electronically Signed   By: Gerome Sam Mitchell M.D   On: 10/24/2020 10:36    Labs:  Basic Metabolic Panel: BMP Latest Ref Rng & Units 11/03/2020 10/29/2020 10/26/2020  Glucose 70 - 99 mg/dL 841(L) 244(W) 102(V)  BUN 8 - 23 mg/dL 25(D) 66(Y) 40(H)  Creatinine 0.61 - 1.24 mg/dL 4.74 2.59 5.63  Sodium 135 - 145 mmol/L 136 138 138  Potassium 3.5 - 5.1 mmol/L 4.0 4.2 4.2  Chloride 98 - 111 mmol/L 105 104 104  CO2 22 - 32 mmol/L 26 27 26   Calcium 8.9 - 10.3 mg/dL ) 8.9 8.7(F)     CBC: CBC Latest Ref Rng & Units 11/03/2020 10/26/2020 10/19/2020  WBC 4.0 - 10.5 K/uL 7.2 6.5 8.5  Hemoglobin 13.0 - 17.0 g/dL 9.0(L) 8.9(L) 8.3(L)  Hematocrit 39.0 - 52.0 % 29.7(L) 28.9(L) 27.3(L)  Platelets 150 - 400 K/uL 248 336 359     CBG: No results for input(s): GLUCAP in the last 168 hours.  Brief HPI:   Nathan Mitchell is a 87 y.o. male with history of CHF-on Xarelto, giant cell arteritis, thoracic aneurysm, recent p.o. requiring Foley placement was admitted on 10/08/2020 after tripping on his Foley  catheter and falling while getting up from a table at home.  He was found to have large left SDH overlying left cerebral convexity with significant mass-effect on left hemisphere with effacement of left lateral ventricle and 7 mm left-to-right midline shift as well as severely comminuted displaced proximal right IT femur fracture.  Xarelto was reversed and he was taken to the OR for left bur hole evacuation with placement of ventriculostomy by Dr. 12/08/2020 as well as ORIF right hip by Dr. Johnsie Cancel.  Subdural drain was removed on 06/12 when he received 2 units PRBCs for acute blood loss anemia.  Hospital course significant for issues with confusion as well as reports of increase in back pain with initiation of therapy.  He was found to have T4 compression fracture with severe spinal stenosis L3-4 as well as patchy pulmonary opacities in RUL. He was maintained on Keppra for seizure prophylaxis and no bracing needed per Dr. 08/12. Therapy ongoing and patient was limited by confusion with cognitive deficits, premorbid visual deficits and delay in processing. CIR recommended due to functional decline.    Hospital Course: Nathan Mitchell was admitted to rehab 10/13/2020 for inpatient therapies to consist of PT, ST and OT at least three hours five days a week. Past admission physiatrist, therapy team and rehab RN have worked together to provide customized collaborative inpatient rehab. Initially patient had issues with confusion at night which has resolved with improvement  in sleep hygiene.  Keppra was discontinued on 06/16 and he has been seizure-free during his stay. Abnormal LFTs have resolved with decrease in T bili and MTX was resumed on 06/24 per input from Dr. Nickola Major. Appetite has been good and pre-renal azotemia noted. He was advised to increase fluid intake and follow up BMET showed that BUN is stable at 24.   Hip/flank is resolving and serial CBC showed that ABLA is stable.  Staples and sutures were  removed without difficulty.  Reactive leucocytosis has resolved and hip incision is C/D/I and is healing well without any signs or symptoms of infection.  His blood pressures and HR were monitored on TID basis and have been stable.  His Foley was changed out on 06/17 as well as 07/05 and he is to follow-up with urology for repeat voiding trial. He has made steady gains and supervision is recommended for safety. Family has hired Engineer[employer] to assist with care. He will continue to receive follow up HHPT, HHOT and HHST by [facility] after discharge.    Rehab course: During patient's stay in rehab weekly team conferences were held to monitor patient's progress, set goals and discuss barriers to discharge. At admission, patient required assist with mobility and max assist with ADL tasks. He exhibited mild cognitive impairments with MoCA blind score 16/22. He  has had improvement in activity tolerance, balance, postural control as well as ability to compensate for deficits. He is able to complete ADL tasks with supervision.  He requires supervision with cues to ambulate >150' with RW. He requires supervision with cues for recall and for awareness of deficits. Family education was completed with wife and aide.   Discharge disposition: 01-Home or Self Care  Diet:  Regular  Special Instructions: Perform foley care twice a day   Allergies as of 11/04/2020       Reactions   Tiotropium Bromide Monohydrate    Other reaction(s): ineffecticve   Umeclidinium-vilanterol    Other reaction(s): ineffective        Medication List     STOP taking these medications    cholecalciferol 1000 units tablet Commonly known as: VITAMIN D   HYDROcodone-acetaminophen 5-325 MG tablet Commonly known as: NORCO/VICODIN   rivaroxaban 20 MG Tabs tablet Commonly known as: XARELTO       TAKE these medications    acetaminophen 325 MG tablet Commonly known as: TYLENOL Take 2 tablets (650 mg total) by mouth 3 (three)  times daily. What changed:  medication strength how much to take when to take this reasons to take this Notes to patient: Can wean to as needed as pain improves   CRANBERRY EXTRACT PO Take 1 capsule by mouth at bedtime.   docusate sodium 100 MG capsule Commonly known as: COLACE Take 1 capsule (100 mg total) by mouth 2 (two) times daily. Need to purchase over the counter Notes to patient: For constipation   escitalopram 20 MG tablet Commonly known as: LEXAPRO Take 20 mg by mouth daily.   famotidine 40 MG tablet Commonly known as: PEPCID Take 40 mg by mouth daily as needed.   ferrous gluconate 240 (27 FE) MG tablet Commonly known as: FERGON Take 240 mg by mouth every Monday, Wednesday, and Friday.   folic acid 1 MG tablet Commonly known as: FOLVITE Take 1 mg by mouth daily.   melatonin 3 MG Tabs tablet Take 1 tablet (3 mg total) by mouth at bedtime. Purchase over the counter   methotrexate 2.5 MG tablet Commonly  known as: RHEUMATREX Take 20 mg by mouth every Friday.   Omeprazole 20 MG Tbec Take 20 mg by mouth daily.   polyethylene glycol 17 g packet Commonly known as: MIRALAX / GLYCOLAX Take 17 g by mouth 2 (two) times daily. Need to purchase over the counter Notes to patient: For constipation   tamsulosin 0.4 MG Caps capsule Commonly known as: FLOMAX Take 0.8 mg by mouth daily.   vitamin B-12 1000 MCG tablet Commonly known as: CYANOCOBALAMIN Take 1,000 mcg by mouth daily.        Follow-up Information     Tarry Kos, MD. Schedule an appointment as soon as possible for a visit in 4 week(s).   Specialty: Orthopedic Surgery Contact information: [address] [place] [address] [phone]         Ranelle Oyster, MD Follow up.   Specialty: Physical Medicine and Rehabilitation Why: office will call you with follow up appointment Contact information: [address] [place] [address] [phone]          Merlene Laughter, MD. Call.   Specialty: Internal Medicine Why: for post hospital follow up and input on blood thinners Contact information: [address] [place] [address] [phone]         Jadene Pierini, MD. Call.   Specialty: Neurosurgery Why: for follow up appointment Contact information: [address] [place] [address] [phone]                 Signed: Jacquelynn Cree 11/04/2020, 5:39 PM

## 2020-11-03 NOTE — Progress Notes (Signed)
Occupational Therapy TBI Note  Patient Details  Name: Nathan Mitchell MRN: [id number] Date of Birth: January 06, 1931  Today's Date: 11/03/2020 OT Individual Time: 4098-1191 OT Individual Time Calculation (min): 57 min    Short Term Goals: Week 1:  OT Short Term Goal 1 (Week 1): Pt will stand pivot transfer to BSC/toilet wiht MOD A consistently OT Short Term Goal 1 - Progress (Week 1): Met OT Short Term Goal 2 (Week 1): Pt will thread 1LE into pants OT Short Term Goal 2 - Progress (Week 1): Progressing toward goal OT Short Term Goal 3 (Week 1): Pt will groom wiht S OT Short Term Goal 3 - Progress (Week 1): Met  Skilled Therapeutic Interventions/Progress Updates:    Pt received in room in bed and consented to OT tx. Pt finishing up breakfast and requested to take a shower. Session focused on ADL discharge analysis including toileting, bathing, dressing, grooming, and functional transfers. Pt sat EOB with SUP, stood with RW and walked into bathroom with close SUP, completed toileting tasks with CGA for steadying during clothing mgmt. Pt then transferred into walk in shower with CGA and bathed with close SUP. Pt required frequent rest breaks due to fatigue and appeared wheezy during  session. Educated in [place]. Pt req mod A for threading feet and cath bag through pant holes 2/2 visual impairment and fatigue. UBD completed with setup and cuing for front of shirt vs back of shirt 2/2 visual impairments. Pt then instructed in shaving task, required to complete task seated 2/2 fatigue and cuing for proficiency. After tx, pt left up in recliner with all needs met awaiting PT session to follow.   Therapy Documentation Precautions:  Precautions Precautions: Fall Precaution Comments: foley, T4 compression fx (no brace) Restrictions Weight Bearing Restrictions: No RLE Weight Bearing: Weight bearing as tolerated LLE Weight Bearing: Weight bearing as tolerated    Pain: none   Agitated Behavior  Scale: TBI Observation Details Observation Environment: pt room Start of observation period - Date: 11/03/20 Start of observation period - Time: 0759 End of observation period - Date: 11/03/20 End of observation period - Time: 0856 Agitated Behavior Scale (DO NOT LEAVE BLANKS) Short attention span, easy distractibility, inability to concentrate: Absent Impulsive, impatient, low tolerance for pain or frustration: Absent Uncooperative, resistant to care, demanding: Absent Violent and/or threatening violence toward people or property: Absent Explosive and/or unpredictable anger: Absent Rocking, rubbing, moaning, or other self-stimulating behavior: Absent Pulling at tubes, restraints, etc.: Absent Wandering from treatment areas: Absent Restlessness, pacing, excessive movement: Absent Repetitive behaviors, motor, and/or verbal: Absent Rapid, loud, or excessive talking: Absent Sudden changes of mood: Absent Easily initiated or excessive crying and/or laughter: Absent Self-abusiveness, physical and/or verbal: Absent Agitated behavior scale total score: 14    Therapy/Group: Individual Therapy  Jillyan Plitt 11/03/2020, 8:58 AM

## 2020-11-03 NOTE — Progress Notes (Addendum)
Physical Therapy Discharge Summary  Patient Details  Name: Nathan Mitchell MRN: [id number] Date of Birth: [birth year]  Today's Date: 11/03/2020 PT Individual Time: 0905-0950 PT Individual Time Calculation (min): 45 min    Patient has met 10 of 10 long term goals due to improved activity tolerance, improved balance, improved postural control, increased strength, increased range of motion, decreased pain, and ability to compensate for deficits.  Patient to discharge at an ambulatory level Supervision.   Patient's care partner is independent to provide the necessary physical and cognitive assistance at discharge.  Recommendation:  Patient will benefit from ongoing skilled PT services in home health setting to continue to advance safe functional mobility, address ongoing impairments in balance, strength, ROM, functional mobility, gait training, recall, patient/caregiver education, and minimize fall risk.  Equipment: RW, transport chair for energy conservation and community access  Reasons for discharge: treatment goals met  Patient/family agrees with progress made and goals achieved: Yes  Skilled Therapeutic Interventions: Patient in recliner in the room upon PT arrival. Patient alert and agreeable to PT session. Patient reported 5-6/10 R hip pain during session, RN made aware. PT provided repositioning, rest breaks, and distraction as pain interventions throughout session. Pain improved with mobility this session.  Therapeutic Activity: Bed Mobility: Patient performed supine to/from sit with supervision in elevated flat bed without use of bed rail to simulate home set up. . Transfers: Patient performed sit to/from stand x4 with supervision using RW. Provided verbal cues for backing up to the seat x1. Patient performed a simulated sedan height car transfer with supervision using RW. Provided cues for safe technique, patient able to teach back leanng the seat back for improved hip angle when  bringing his legs into the car.  Gait Training:  Patient ambulated >150 feet using RW with supervision for safety and min cues for navigation due to visual deficits. Ambulated as described above. Noted increased shoulder elevation. Adjusted RW height for improved posture with use of device. Patient ambulated up/down a ramp, over 10 feet of mulch (unlevel surface), and up/down a curb to simulate community ambulation over unlevel surfaces with CGA and min A for AD management using RW. Provided cues for technique and use of AD.  Wheelchair Mobility:  Patient propelled wheelchair >150 feet with supervision. Provided verbal cues for navigation.   Patient performed management of his catheter bag with min cues for recall throughout session. Patient reports feeling excited to go home and denied questions or concerns about d/c at this time.   Patient in bed at end of session with breaks locked, bed alarm set, and all needs within reach.   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, can see large shapes and bright colors Perception Perception: Impaired Inattention/Neglect:  (mild L inattention (impacted by visual deficits at baseline)) Praxis Praxis: Impaired Praxis Impairment Details: Motor planning  Cognition Overall Cognitive Status: Impaired/Different from baseline Arousal/Alertness: Awake/alert Attention: Selective Selective Attention: Appears intact Memory: Impaired Memory Impairment: Decreased recall of new information Decreased Short Term Memory: Verbal basic;Functional basic Awareness: Impaired Awareness Impairment: Anticipatory impairment Problem Solving: Impaired Problem  Solving Impairment: Verbal basic;Functional basic Safety/Judgment: Impaired [employer] Scales of Cognitive Functioning: Purposeful/appropriate Sensation Sensation Light Touch: Impaired Detail Peripheral sensation comments: Peripheral neuropathy- affects distal toes  only in terms of sensation Light Touch Impaired Details: Impaired RLE;Impaired LLE Proprioception: Impaired Detail Proprioception Impaired Details: Impaired RLE Coordination Gross Motor Movements are Fluid and Coordinated: No Fine Motor Movements are Fluid and Coordinated: Yes Coordination and Movement Description: pain guarding with R LE movements, generalized weakness Motor  Motor Motor: Abnormal postural alignment and control Motor - Discharge Observations: generalized weakness and ROM deficits secondary to R hip fx  Mobility Bed Mobility Bed Mobility: Supine to Sit;Sit to Supine;Rolling Right;Rolling Left Rolling Right: Supervision/verbal cueing Rolling Left: Supervision/Verbal cueing Supine to Sit: Supervision/Verbal cueing Sit to Supine: Supervision/Verbal cueing Transfers Transfers: Sit to Stand;Stand to Sit;Stand Pivot Transfers Sit to Stand: Supervision/Verbal cueing Stand to Sit: Supervision/Verbal cueing Stand Pivot Transfers: Supervision/Verbal cueing Transfer (Assistive device): Rolling walker Locomotion  Gait Ambulation: Yes Gait Assistance: Supervision/Verbal cueing Gait Distance (Feet): 180 Feet Assistive device: Rolling walker Gait Assistance Details: Verbal cues for precautions/safety;Verbal cues for technique Gait Gait: Yes Gait Pattern: Step-through pattern;Decreased stance time - right;Antalgic;Trunk flexed;Narrow base of support Gait velocity: decreased Stairs / Additional Locomotion Stairs: Yes Stairs Assistance: Contact Guard/Touching assist Stair Management Technique: With walker Number of Stairs: 1 Height of Stairs: 4 Wheelchair Mobility Wheelchair Mobility: Yes Wheelchair  Assistance: Chartered loss adjuster: Both upper extremities Wheelchair Parts Management: Needs assistance Distance: >150 ft  Trunk/Postural Assessment  Cervical Assessment Cervical Assessment: Exceptions to [facility] (forward head) Thoracic Assessment Thoracic Assessment: Exceptions to [facility] (rounded shoulders) Lumbar Assessment Lumbar Assessment: Exceptions to [facility] (posterior pelvic tilt) Postural Control Postural Control: Deficits on evaluation (decreased/delayed)  Balance Balance Balance Assessed: Yes Static Sitting Balance Static Sitting - Level of Assistance: 6: Modified independent (Device/Increase time) Dynamic Sitting Balance Dynamic Sitting - Level of Assistance: 5: Stand by assistance Static Standing Balance Static Standing - Level of Assistance: 5: Stand by assistance Dynamic Standing Balance Dynamic Standing - Balance Support: During functional activity;Bilateral upper extremity supported Dynamic Standing - Level of Assistance: 5: Stand by assistance Extremity Assessment  RLE Assessment RLE Assessment: Exceptions to [facility] Passive Range of Motion (PROM) Comments: Grossly 0-100 deg hip flexion in supine General Strength Comments: Grossly 3+/5 throughout with functional mobility LLE Assessment LLE Assessment: Within Functional Limits Active Range of Motion (AROM) Comments: [facility] General Strength Comments: 5/5 throughout in sitting    L  PT, DPT  11/03/2020, 6:28 PM

## 2020-11-04 ENCOUNTER — Telehealth: Payer: Self-pay | Admitting: Orthopaedic Surgery

## 2020-11-04 MED ORDER — DOCUSATE SODIUM 100 MG PO CAPS: 100.0000 mg | ORAL_CAPSULE | Freq: Two times a day (BID) | ORAL | 0 refills | Status: AC

## 2020-11-04 MED ORDER — ACETAMINOPHEN 325 MG PO TABS: 650.0000 mg | ORAL_TABLET | Freq: Three times a day (TID) | ORAL | Status: AC

## 2020-11-04 MED ORDER — POLYETHYLENE GLYCOL 3350 17 G PO PACK: 17.0000 g | PACK | Freq: Two times a day (BID) | ORAL | 0 refills | Status: AC

## 2020-11-04 MED ORDER — MELATONIN 3 MG PO TABS: 3.0000 mg | ORAL_TABLET | Freq: Every day | ORAL | 0 refills | Status: AC

## 2020-11-04 NOTE — Telephone Encounter (Signed)
Sure whats her number

## 2020-11-04 NOTE — Telephone Encounter (Signed)
I think he was on xarelto prior to the surgery.

## 2020-11-04 NOTE — Telephone Encounter (Signed)
That's fine

## 2020-11-04 NOTE — Telephone Encounter (Signed)
Spoke with the PA

## 2020-11-04 NOTE — Telephone Encounter (Signed)
Pts wife called to make the pts postop appt; she stated he was released from [facility] rehab today and they told her to F/U 4 weeks from 11/04/20. Pt hasn't had any postop appts with Dr. Roda Shutters and his surgery was 10/08/20. I went ahead and reserved the appt for 12/02/20 as that's 4 weeks from today but I just wanted to make sure the pt didn't need to be seen in office sooner than that.

## 2020-11-04 NOTE — Telephone Encounter (Signed)
Thank you, its is  Cb # [phone]

## 2020-11-04 NOTE — Telephone Encounter (Signed)
I called and talked to her. She has some more questions. Can you please call her

## 2020-11-04 NOTE — Progress Notes (Signed)
Pt in bed upon arrival, resting. Pleasant and polite. Pt prepped for foley removal. 72f foley removed, tip intact. No s/s of infection of trauma observed. Nurse placed new 38f foley with sterile technique, 10 cc balloon inflated. Secure with stat lock to right thigh. Pt tolerated well. Spouse updated via phone. NT present.

## 2020-11-04 NOTE — Progress Notes (Signed)
PROGRESS NOTE   Subjective/Complaints: In good spirits. Right hip sore. Excited to go home!  ROS: Patient denies fever, rash, sore throat, blurred vision, nausea, vomiting, diarrhea, cough, shortness of breath or chest pain,  headache, or mood change.  .    Objective:   No results found. Recent Labs    11/03/20 0020  WBC 7.2  HGB 9.0*  HCT 29.7*  PLT 248     Recent Labs    11/03/20 0020  NA 136  K 4.0  CL 105  CO2 26  GLUCOSE 110*  BUN 24*  CREATININE 0.97  CALCIUM 8.8*    Intake/Output Summary (Last 24 hours) at 11/04/2020 0851 Last data filed at 11/04/2020 0824 Gross per 24 hour  Intake 1080 ml  Output 2800 ml  Net -1720 ml        Physical Exam: Vital Signs Blood pressure 133/77, pulse 85, temperature 98 F (36.7 C), temperature source Oral, resp. rate 17, height 6\' 1"  (1.854 m), weight 78 kg, SpO2 100 %. Constitutional: No distress . Vital signs reviewed. HEENT: EOMI, oral membranes moist Neck: supple Cardiovascular: RRR without murmur. No JVD    Respiratory/Chest: CTA Bilaterally without wheezes or rales. Normal effort    GI/Abdomen: BS +, non-tender, non-distended Ext: no clubbing, cyanosis, or edema Psych: pleasant and cooperative Skin: intact, right hip wound  Neuro: functional insight, memory, and awareness. Speech clear. Normal language. Cranial nerves 2-12 are intact. Sensory exam is normal. Reflexes are 2+ in all 4's. Fine motor coordination is intact. No tremors. Motor function is grossly 5/5 in UE's. RLE 3+/5 d/t pain prox to 4/5 distally. LLE 4-5/5 prox to distal. Improving pain tolerance RLE. Musculoskeletal:  mild right thigh tenderness.  Assessment/Plan: 1. Functional deficits which require 3+ hours per day of interdisciplinary therapy in a comprehensive inpatient rehab setting. Physiatrist is providing close team supervision and 24 hour management of active medical problems listed  below. Physiatrist and rehab team continue to assess barriers to discharge/monitor patient progress toward functional and medical goals  Care Tool:  Bathing    Body parts bathed by patient: Right arm, Left arm, Chest, Abdomen, Front perineal area, Right upper leg, Left upper leg, Face, Left lower leg, Right lower leg, Buttocks   Body parts bathed by helper: Right lower leg, Left lower leg, Buttocks     Bathing assist Assist Level: Supervision/Verbal cueing     Upper Body Dressing/Undressing Upper body dressing   What is the patient wearing?: Pull over shirt    Upper body assist Assist Level: Set up assist    Lower Body Dressing/Undressing Lower body dressing      What is the patient wearing?: Pants     Lower body assist Assist for lower body dressing: Contact Guard/Touching assist     Toileting Toileting    Toileting assist Assist for toileting: Supervision/Verbal cueing     Transfers Chair/bed transfer  Transfers assist     Chair/bed transfer assist level: Supervision/Verbal cueing Chair/bed transfer assistive device:   Ambulation assist      Assist level: Supervision/Verbal cueing Assistive device: Walker-rolling Max distance: 180 ft   Walk 10 feet activity  Assist  Walk 10 feet activity did not occur: Safety/medical concerns (Limited by increased R hip pain)  Assist level: Supervision/Verbal cueing Assistive device: Walker-rolling   Walk 50 feet activity   Assist Walk 50 feet with 2 turns activity did not occur: Safety/medical concerns  Assist level: Supervision/Verbal cueing Assistive device: Walker-rolling    Walk 150 feet activity   Assist Walk 150 feet activity did not occur: Safety/medical concerns  Assist level: Supervision/Verbal cueing Assistive device: Walker-rolling    Walk 10 feet on uneven surface  activity   Assist Walk 10 feet on uneven surfaces activity did not occur:  Safety/medical concerns   Assist level: Contact Guard/Touching assist Assistive device: Photographer Will patient use wheelchair at discharge?: Yes Type of Wheelchair:  (transport chair) Wheelchair activity did not occur: Safety/medical concerns (Limited by increased R hip pain during evaluation)  Wheelchair assist level: Supervision/Verbal cueing Max wheelchair distance: >150 ft    Wheelchair 50 feet with 2 turns activity    Assist    Wheelchair 50 feet with 2 turns activity did not occur: Safety/medical concerns   Assist Level: Supervision/Verbal cueing   Wheelchair 150 feet activity     Assist  Wheelchair 150 feet activity did not occur: Safety/medical concerns   Assist Level: Supervision/Verbal cueing   Blood pressure 133/77, pulse 85, temperature 98 F (36.7 C), temperature source Oral, resp. rate 17, height 6\' 1"  (1.854 m), weight 78 kg, SpO2 100 %.        Medical Problem List and Plan: 1.  TBI with associated SDH. Also suffered right IT hip fracture, T4 compression fx             -dc home today  -f/u with me in 3-4 weeks    2.  Impaired mobility -DVT/anticoagulation:  Pharmaceutical: Continue Lovenox bid, need to check with ortho how long he will need this so we can provide injection education if warranted.              -antiplatelet therapy: N/A 3. Right IT hip fracture: Schedule tylenol 650mg  TID. LFTs reviewed and wnl. Continue oxycodone/robaxin prn, discussed with team that he is not using much, can consider using prior to therapy to maximize participation.  4. Mood: LCSW to follow for evaluation and support.             -antipsychotic agents: N/A 5. Neuropsych: This patient is not fully capable of making decisions on his own behalf.             --cognition improving. Probably close to baseline    6. Skin/Wound Care: foam dressing to right hip incision-minimal to no drainage 7. Fluids/Electrolytes/Nutrition: Appetite  has been good per wife. -- encourage PO as BUN/Cr elevated 6/27 -BUN/Cr stable 7/5 8. Right femur fracture: WBAT.              --wounds healing nicely  -removed staples and sutures   9. L-SDH: off Keprra since 6/16  no seizures            10 Giant cell arteritis with visual loss: Was on methotrexate weekly. --Spoke to Dr. 7/27 6/16---resumed MTX 6/24. T bili normalized. Folic acid changed back to 1mg  dose.  11. Nickola Major HR tid--incidental finding years ago and has been controlled.             --To follow up with Card? PCP regarding d/c of Xarelto per notes. 12. Constipation: Continue miralax.  13. Urinary retention: Has  failed one voiding trial by Dr. Cardell Peach.             --plans for ultrasound of bladder/prostate? 06/24?             --Continue Flomax at bedtime.  -foley change 7/5 14. Leucocytosis: Resolving without signs of infection             --encourage pulmonary toilet for likely Right atelectasis. 15. ABLA: Continue to monitor H/H with serial checks and monitor for signs of bleeding. --hgb 8.9--> 9.0. no gross signs of bleeding 16. H/o COPD: Stable. Did not like trial of MDI per wife.  17. Vitamin B12 deficiency: Continue Vitamin B12 daily ordered 18. Hypotension: bp's improved. Remains asymptomatic  LOS: 22 days A FACE TO FACE EVALUATION WAS PERFORMED  Ranelle Oyster 11/04/2020, 8:51 AM

## 2020-11-05 DIAGNOSIS — N401 Enlarged prostate with lower urinary tract symptoms: Secondary | ICD-10-CM | POA: Diagnosis not present

## 2020-11-05 DIAGNOSIS — R338 Other retention of urine: Secondary | ICD-10-CM | POA: Diagnosis not present

## 2020-11-05 NOTE — Telephone Encounter (Signed)
Called no answer

## 2020-11-05 NOTE — Telephone Encounter (Signed)
Just need to advise on message below.  

## 2020-11-06 NOTE — Telephone Encounter (Signed)
Called patient no answer LMOM. Okay per Dr Roda Shutters.

## 2020-11-09 DIAGNOSIS — R339 Retention of urine, unspecified: Secondary | ICD-10-CM | POA: Diagnosis not present

## 2020-11-10 DIAGNOSIS — H547 Unspecified visual loss: Secondary | ICD-10-CM | POA: Diagnosis not present

## 2020-11-10 DIAGNOSIS — S72141D Displaced intertrochanteric fracture of right femur, subsequent encounter for closed fracture with routine healing: Secondary | ICD-10-CM | POA: Diagnosis not present

## 2020-11-10 DIAGNOSIS — M4854XD Collapsed vertebra, not elsewhere classified, thoracic region, subsequent encounter for fracture with routine healing: Secondary | ICD-10-CM | POA: Diagnosis not present

## 2020-11-10 DIAGNOSIS — J449 Chronic obstructive pulmonary disease, unspecified: Secondary | ICD-10-CM | POA: Diagnosis not present

## 2020-11-10 DIAGNOSIS — E7800 Pure hypercholesterolemia, unspecified: Secondary | ICD-10-CM | POA: Diagnosis not present

## 2020-11-10 DIAGNOSIS — K5900 Constipation, unspecified: Secondary | ICD-10-CM | POA: Diagnosis not present

## 2020-11-10 DIAGNOSIS — S065X0D Traumatic subdural hemorrhage without loss of consciousness, subsequent encounter: Secondary | ICD-10-CM | POA: Diagnosis not present

## 2020-11-10 DIAGNOSIS — I4891 Unspecified atrial fibrillation: Secondary | ICD-10-CM | POA: Diagnosis not present

## 2020-11-10 DIAGNOSIS — M316 Other giant cell arteritis: Secondary | ICD-10-CM | POA: Diagnosis not present

## 2020-11-11 DIAGNOSIS — R5381 Other malaise: Secondary | ICD-10-CM | POA: Diagnosis not present

## 2020-11-11 DIAGNOSIS — K5901 Slow transit constipation: Secondary | ICD-10-CM | POA: Diagnosis not present

## 2020-11-11 DIAGNOSIS — Z978 Presence of other specified devices: Secondary | ICD-10-CM | POA: Diagnosis not present

## 2020-11-11 DIAGNOSIS — M25551 Pain in right hip: Secondary | ICD-10-CM | POA: Diagnosis not present

## 2020-11-11 DIAGNOSIS — R269 Unspecified abnormalities of gait and mobility: Secondary | ICD-10-CM | POA: Diagnosis not present

## 2020-11-11 DIAGNOSIS — S72001D Fracture of unspecified part of neck of right femur, subsequent encounter for closed fracture with routine healing: Secondary | ICD-10-CM | POA: Diagnosis not present

## 2020-11-11 DIAGNOSIS — S069X0D Unspecified intracranial injury without loss of consciousness, subsequent encounter: Secondary | ICD-10-CM | POA: Diagnosis not present

## 2020-11-11 DIAGNOSIS — H539 Unspecified visual disturbance: Secondary | ICD-10-CM | POA: Diagnosis not present

## 2020-11-13 DIAGNOSIS — M316 Other giant cell arteritis: Secondary | ICD-10-CM | POA: Diagnosis not present

## 2020-11-13 DIAGNOSIS — H547 Unspecified visual loss: Secondary | ICD-10-CM | POA: Diagnosis not present

## 2020-11-13 DIAGNOSIS — S069X9A Unspecified intracranial injury with loss of consciousness of unspecified duration, initial encounter: Secondary | ICD-10-CM | POA: Diagnosis not present

## 2020-11-13 DIAGNOSIS — S72141D Displaced intertrochanteric fracture of right femur, subsequent encounter for closed fracture with routine healing: Secondary | ICD-10-CM | POA: Diagnosis not present

## 2020-11-13 DIAGNOSIS — M4854XD Collapsed vertebra, not elsewhere classified, thoracic region, subsequent encounter for fracture with routine healing: Secondary | ICD-10-CM | POA: Diagnosis not present

## 2020-11-13 DIAGNOSIS — J449 Chronic obstructive pulmonary disease, unspecified: Secondary | ICD-10-CM | POA: Diagnosis not present

## 2020-11-13 DIAGNOSIS — S065X0D Traumatic subdural hemorrhage without loss of consciousness, subsequent encounter: Secondary | ICD-10-CM | POA: Diagnosis not present

## 2020-11-13 DIAGNOSIS — E7800 Pure hypercholesterolemia, unspecified: Secondary | ICD-10-CM | POA: Diagnosis not present

## 2020-11-13 DIAGNOSIS — K5900 Constipation, unspecified: Secondary | ICD-10-CM | POA: Diagnosis not present

## 2020-11-13 DIAGNOSIS — I4891 Unspecified atrial fibrillation: Secondary | ICD-10-CM | POA: Diagnosis not present

## 2020-11-16 ENCOUNTER — Other Ambulatory Visit: Payer: Self-pay | Admitting: Neurological Surgery

## 2020-11-16 DIAGNOSIS — M316 Other giant cell arteritis: Secondary | ICD-10-CM | POA: Diagnosis not present

## 2020-11-16 DIAGNOSIS — J449 Chronic obstructive pulmonary disease, unspecified: Secondary | ICD-10-CM | POA: Diagnosis not present

## 2020-11-16 DIAGNOSIS — K5900 Constipation, unspecified: Secondary | ICD-10-CM | POA: Diagnosis not present

## 2020-11-16 DIAGNOSIS — S065X9A Traumatic subdural hemorrhage with loss of consciousness of unspecified duration, initial encounter: Secondary | ICD-10-CM

## 2020-11-16 DIAGNOSIS — S065XAA Traumatic subdural hemorrhage with loss of consciousness status unknown, initial encounter: Secondary | ICD-10-CM

## 2020-11-16 DIAGNOSIS — S72141D Displaced intertrochanteric fracture of right femur, subsequent encounter for closed fracture with routine healing: Secondary | ICD-10-CM | POA: Diagnosis not present

## 2020-11-16 DIAGNOSIS — M4854XD Collapsed vertebra, not elsewhere classified, thoracic region, subsequent encounter for fracture with routine healing: Secondary | ICD-10-CM | POA: Diagnosis not present

## 2020-11-16 DIAGNOSIS — E7800 Pure hypercholesterolemia, unspecified: Secondary | ICD-10-CM | POA: Diagnosis not present

## 2020-11-16 DIAGNOSIS — I4891 Unspecified atrial fibrillation: Secondary | ICD-10-CM | POA: Diagnosis not present

## 2020-11-16 DIAGNOSIS — H547 Unspecified visual loss: Secondary | ICD-10-CM | POA: Diagnosis not present

## 2020-11-16 DIAGNOSIS — S065X0D Traumatic subdural hemorrhage without loss of consciousness, subsequent encounter: Secondary | ICD-10-CM | POA: Diagnosis not present

## 2020-11-17 DIAGNOSIS — S065X0D Traumatic subdural hemorrhage without loss of consciousness, subsequent encounter: Secondary | ICD-10-CM | POA: Diagnosis not present

## 2020-11-17 DIAGNOSIS — I4891 Unspecified atrial fibrillation: Secondary | ICD-10-CM | POA: Diagnosis not present

## 2020-11-17 DIAGNOSIS — M4854XD Collapsed vertebra, not elsewhere classified, thoracic region, subsequent encounter for fracture with routine healing: Secondary | ICD-10-CM | POA: Diagnosis not present

## 2020-11-17 DIAGNOSIS — M316 Other giant cell arteritis: Secondary | ICD-10-CM | POA: Diagnosis not present

## 2020-11-17 DIAGNOSIS — E7800 Pure hypercholesterolemia, unspecified: Secondary | ICD-10-CM | POA: Diagnosis not present

## 2020-11-17 DIAGNOSIS — H547 Unspecified visual loss: Secondary | ICD-10-CM | POA: Diagnosis not present

## 2020-11-17 DIAGNOSIS — K5900 Constipation, unspecified: Secondary | ICD-10-CM | POA: Diagnosis not present

## 2020-11-17 DIAGNOSIS — S72141D Displaced intertrochanteric fracture of right femur, subsequent encounter for closed fracture with routine healing: Secondary | ICD-10-CM | POA: Diagnosis not present

## 2020-11-17 DIAGNOSIS — J449 Chronic obstructive pulmonary disease, unspecified: Secondary | ICD-10-CM | POA: Diagnosis not present

## 2020-11-18 DIAGNOSIS — J449 Chronic obstructive pulmonary disease, unspecified: Secondary | ICD-10-CM | POA: Diagnosis not present

## 2020-11-18 DIAGNOSIS — M316 Other giant cell arteritis: Secondary | ICD-10-CM | POA: Diagnosis not present

## 2020-11-18 DIAGNOSIS — E7800 Pure hypercholesterolemia, unspecified: Secondary | ICD-10-CM | POA: Diagnosis not present

## 2020-11-18 DIAGNOSIS — K5900 Constipation, unspecified: Secondary | ICD-10-CM | POA: Diagnosis not present

## 2020-11-18 DIAGNOSIS — S065X0D Traumatic subdural hemorrhage without loss of consciousness, subsequent encounter: Secondary | ICD-10-CM | POA: Diagnosis not present

## 2020-11-18 DIAGNOSIS — H547 Unspecified visual loss: Secondary | ICD-10-CM | POA: Diagnosis not present

## 2020-11-18 DIAGNOSIS — I4891 Unspecified atrial fibrillation: Secondary | ICD-10-CM | POA: Diagnosis not present

## 2020-11-18 DIAGNOSIS — S72141D Displaced intertrochanteric fracture of right femur, subsequent encounter for closed fracture with routine healing: Secondary | ICD-10-CM | POA: Diagnosis not present

## 2020-11-18 DIAGNOSIS — M4854XD Collapsed vertebra, not elsewhere classified, thoracic region, subsequent encounter for fracture with routine healing: Secondary | ICD-10-CM | POA: Diagnosis not present

## 2020-11-20 DIAGNOSIS — I4891 Unspecified atrial fibrillation: Secondary | ICD-10-CM | POA: Diagnosis not present

## 2020-11-20 DIAGNOSIS — K5900 Constipation, unspecified: Secondary | ICD-10-CM | POA: Diagnosis not present

## 2020-11-20 DIAGNOSIS — S065X0D Traumatic subdural hemorrhage without loss of consciousness, subsequent encounter: Secondary | ICD-10-CM | POA: Diagnosis not present

## 2020-11-20 DIAGNOSIS — S72141D Displaced intertrochanteric fracture of right femur, subsequent encounter for closed fracture with routine healing: Secondary | ICD-10-CM | POA: Diagnosis not present

## 2020-11-20 DIAGNOSIS — H547 Unspecified visual loss: Secondary | ICD-10-CM | POA: Diagnosis not present

## 2020-11-20 DIAGNOSIS — E7800 Pure hypercholesterolemia, unspecified: Secondary | ICD-10-CM | POA: Diagnosis not present

## 2020-11-20 DIAGNOSIS — M316 Other giant cell arteritis: Secondary | ICD-10-CM | POA: Diagnosis not present

## 2020-11-20 DIAGNOSIS — J449 Chronic obstructive pulmonary disease, unspecified: Secondary | ICD-10-CM | POA: Diagnosis not present

## 2020-11-20 DIAGNOSIS — M4854XD Collapsed vertebra, not elsewhere classified, thoracic region, subsequent encounter for fracture with routine healing: Secondary | ICD-10-CM | POA: Diagnosis not present

## 2020-11-23 DIAGNOSIS — E7800 Pure hypercholesterolemia, unspecified: Secondary | ICD-10-CM | POA: Diagnosis not present

## 2020-11-23 DIAGNOSIS — S065X0D Traumatic subdural hemorrhage without loss of consciousness, subsequent encounter: Secondary | ICD-10-CM | POA: Diagnosis not present

## 2020-11-23 DIAGNOSIS — J449 Chronic obstructive pulmonary disease, unspecified: Secondary | ICD-10-CM | POA: Diagnosis not present

## 2020-11-23 DIAGNOSIS — M316 Other giant cell arteritis: Secondary | ICD-10-CM | POA: Diagnosis not present

## 2020-11-23 DIAGNOSIS — R338 Other retention of urine: Secondary | ICD-10-CM | POA: Diagnosis not present

## 2020-11-23 DIAGNOSIS — I4891 Unspecified atrial fibrillation: Secondary | ICD-10-CM | POA: Diagnosis not present

## 2020-11-23 DIAGNOSIS — S72141D Displaced intertrochanteric fracture of right femur, subsequent encounter for closed fracture with routine healing: Secondary | ICD-10-CM | POA: Diagnosis not present

## 2020-11-23 DIAGNOSIS — H547 Unspecified visual loss: Secondary | ICD-10-CM | POA: Diagnosis not present

## 2020-11-23 DIAGNOSIS — N401 Enlarged prostate with lower urinary tract symptoms: Secondary | ICD-10-CM | POA: Diagnosis not present

## 2020-11-23 DIAGNOSIS — K5900 Constipation, unspecified: Secondary | ICD-10-CM | POA: Diagnosis not present

## 2020-11-23 DIAGNOSIS — M4854XD Collapsed vertebra, not elsewhere classified, thoracic region, subsequent encounter for fracture with routine healing: Secondary | ICD-10-CM | POA: Diagnosis not present

## 2020-11-25 DIAGNOSIS — E7800 Pure hypercholesterolemia, unspecified: Secondary | ICD-10-CM | POA: Diagnosis not present

## 2020-11-25 DIAGNOSIS — J449 Chronic obstructive pulmonary disease, unspecified: Secondary | ICD-10-CM | POA: Diagnosis not present

## 2020-11-25 DIAGNOSIS — H547 Unspecified visual loss: Secondary | ICD-10-CM | POA: Diagnosis not present

## 2020-11-25 DIAGNOSIS — S72141D Displaced intertrochanteric fracture of right femur, subsequent encounter for closed fracture with routine healing: Secondary | ICD-10-CM | POA: Diagnosis not present

## 2020-11-25 DIAGNOSIS — K5900 Constipation, unspecified: Secondary | ICD-10-CM | POA: Diagnosis not present

## 2020-11-25 DIAGNOSIS — M316 Other giant cell arteritis: Secondary | ICD-10-CM | POA: Diagnosis not present

## 2020-11-25 DIAGNOSIS — S065X0D Traumatic subdural hemorrhage without loss of consciousness, subsequent encounter: Secondary | ICD-10-CM | POA: Diagnosis not present

## 2020-11-25 DIAGNOSIS — M4854XD Collapsed vertebra, not elsewhere classified, thoracic region, subsequent encounter for fracture with routine healing: Secondary | ICD-10-CM | POA: Diagnosis not present

## 2020-11-25 DIAGNOSIS — I4891 Unspecified atrial fibrillation: Secondary | ICD-10-CM | POA: Diagnosis not present

## 2020-11-26 ENCOUNTER — Other Ambulatory Visit: Payer: Self-pay | Admitting: Orthopaedic Surgery

## 2020-11-26 DIAGNOSIS — E7800 Pure hypercholesterolemia, unspecified: Secondary | ICD-10-CM | POA: Diagnosis not present

## 2020-11-26 DIAGNOSIS — J449 Chronic obstructive pulmonary disease, unspecified: Secondary | ICD-10-CM | POA: Diagnosis not present

## 2020-11-26 DIAGNOSIS — H547 Unspecified visual loss: Secondary | ICD-10-CM | POA: Diagnosis not present

## 2020-11-26 DIAGNOSIS — K5900 Constipation, unspecified: Secondary | ICD-10-CM | POA: Diagnosis not present

## 2020-11-26 DIAGNOSIS — S72141D Displaced intertrochanteric fracture of right femur, subsequent encounter for closed fracture with routine healing: Secondary | ICD-10-CM | POA: Diagnosis not present

## 2020-11-26 DIAGNOSIS — M316 Other giant cell arteritis: Secondary | ICD-10-CM | POA: Diagnosis not present

## 2020-11-26 DIAGNOSIS — M4854XD Collapsed vertebra, not elsewhere classified, thoracic region, subsequent encounter for fracture with routine healing: Secondary | ICD-10-CM | POA: Diagnosis not present

## 2020-11-26 DIAGNOSIS — I4891 Unspecified atrial fibrillation: Secondary | ICD-10-CM | POA: Diagnosis not present

## 2020-11-26 DIAGNOSIS — S065X0D Traumatic subdural hemorrhage without loss of consciousness, subsequent encounter: Secondary | ICD-10-CM | POA: Diagnosis not present

## 2020-11-27 ENCOUNTER — Other Ambulatory Visit: Payer: Self-pay | Admitting: Physician Assistant

## 2020-11-27 ENCOUNTER — Telehealth: Payer: Self-pay | Admitting: Orthopaedic Surgery

## 2020-11-27 MED ORDER — TRAMADOL HCL 50 MG PO TABS: 50.0000 mg | ORAL_TABLET | Freq: Three times a day (TID) | ORAL | 0 refills | Status: DC | PRN

## 2020-11-27 NOTE — Telephone Encounter (Signed)
Sent in tramadol

## 2020-11-27 NOTE — Telephone Encounter (Signed)
Pt daughter called and states her father is in a lot of pain. He has only been taking tylenol and that's not touching the pain. She states he is not sleeping through the night ad he needs something to help him. She said he is on blood thinners so he can't take Asprin.   CB [phone]

## 2020-11-27 NOTE — Telephone Encounter (Signed)
Patients wife aware

## 2020-11-30 DIAGNOSIS — K5900 Constipation, unspecified: Secondary | ICD-10-CM | POA: Diagnosis not present

## 2020-11-30 DIAGNOSIS — S72141D Displaced intertrochanteric fracture of right femur, subsequent encounter for closed fracture with routine healing: Secondary | ICD-10-CM | POA: Diagnosis not present

## 2020-11-30 DIAGNOSIS — M4854XD Collapsed vertebra, not elsewhere classified, thoracic region, subsequent encounter for fracture with routine healing: Secondary | ICD-10-CM | POA: Diagnosis not present

## 2020-11-30 DIAGNOSIS — H547 Unspecified visual loss: Secondary | ICD-10-CM | POA: Diagnosis not present

## 2020-11-30 DIAGNOSIS — S065X0D Traumatic subdural hemorrhage without loss of consciousness, subsequent encounter: Secondary | ICD-10-CM | POA: Diagnosis not present

## 2020-11-30 DIAGNOSIS — I4891 Unspecified atrial fibrillation: Secondary | ICD-10-CM | POA: Diagnosis not present

## 2020-11-30 DIAGNOSIS — M316 Other giant cell arteritis: Secondary | ICD-10-CM | POA: Diagnosis not present

## 2020-11-30 DIAGNOSIS — J449 Chronic obstructive pulmonary disease, unspecified: Secondary | ICD-10-CM | POA: Diagnosis not present

## 2020-11-30 DIAGNOSIS — E7800 Pure hypercholesterolemia, unspecified: Secondary | ICD-10-CM | POA: Diagnosis not present

## 2020-12-01 DIAGNOSIS — R82998 Other abnormal findings in urine: Secondary | ICD-10-CM | POA: Diagnosis not present

## 2020-12-01 DIAGNOSIS — D649 Anemia, unspecified: Secondary | ICD-10-CM | POA: Diagnosis not present

## 2020-12-01 DIAGNOSIS — R410 Disorientation, unspecified: Secondary | ICD-10-CM | POA: Diagnosis not present

## 2020-12-02 ENCOUNTER — Other Ambulatory Visit: Payer: Self-pay

## 2020-12-02 ENCOUNTER — Encounter: Payer: Medicare PPO | Admitting: Orthopaedic Surgery

## 2020-12-02 ENCOUNTER — Ambulatory Visit (INDEPENDENT_AMBULATORY_CARE_PROVIDER_SITE_OTHER): Payer: Medicare PPO

## 2020-12-02 ENCOUNTER — Ambulatory Visit (INDEPENDENT_AMBULATORY_CARE_PROVIDER_SITE_OTHER): Payer: Medicare PPO | Admitting: Orthopaedic Surgery

## 2020-12-02 ENCOUNTER — Encounter: Payer: Self-pay | Admitting: Orthopaedic Surgery

## 2020-12-02 DIAGNOSIS — S72141D Displaced intertrochanteric fracture of right femur, subsequent encounter for closed fracture with routine healing: Secondary | ICD-10-CM | POA: Diagnosis not present

## 2020-12-02 DIAGNOSIS — I4891 Unspecified atrial fibrillation: Secondary | ICD-10-CM | POA: Diagnosis not present

## 2020-12-02 DIAGNOSIS — E7800 Pure hypercholesterolemia, unspecified: Secondary | ICD-10-CM | POA: Diagnosis not present

## 2020-12-02 DIAGNOSIS — H547 Unspecified visual loss: Secondary | ICD-10-CM | POA: Diagnosis not present

## 2020-12-02 DIAGNOSIS — K5900 Constipation, unspecified: Secondary | ICD-10-CM | POA: Diagnosis not present

## 2020-12-02 DIAGNOSIS — S065X0D Traumatic subdural hemorrhage without loss of consciousness, subsequent encounter: Secondary | ICD-10-CM | POA: Diagnosis not present

## 2020-12-02 DIAGNOSIS — Z9889 Other specified postprocedural states: Secondary | ICD-10-CM

## 2020-12-02 DIAGNOSIS — M4854XD Collapsed vertebra, not elsewhere classified, thoracic region, subsequent encounter for fracture with routine healing: Secondary | ICD-10-CM | POA: Diagnosis not present

## 2020-12-02 DIAGNOSIS — J449 Chronic obstructive pulmonary disease, unspecified: Secondary | ICD-10-CM | POA: Diagnosis not present

## 2020-12-02 DIAGNOSIS — M316 Other giant cell arteritis: Secondary | ICD-10-CM | POA: Diagnosis not present

## 2020-12-02 NOTE — Progress Notes (Signed)
Post-Op Visit Note   Patient: Nathan Mitchell           Date of Birth: 11/25/1930           MRN: [id number] Visit Date: 12/02/2020 PCP: Merlene Laughter, MD   Assessment & Plan:  Chief Complaint:  Chief Complaint  Patient presents with   Right Hip - Routine Post Op   Visit Diagnoses:  1. Post-operative state     Plan: Patient is a pleasant 87 year old gentleman who comes in today 6 weeks out right hip IM nail from a an intertrochanteric femur fracture 10/08/2020.  He has been doing well.  He is at home getting home health physical therapy.  He is primarily ambulating with a walker.  He is ambulating unassisted prior to the fall.  He does note slight discomfort to the anterior thigh which is relieved with Tylenol.  Examination of his right hip reveals a fully healed surgical scar without complication.  No evidence of infection or cellulitis.  Calf soft nontender.  He has somewhat limited hip flexion due to weakness.  At this point, he will continue with physical therapy.  Follow-up with [place] in 6 weeks time for repeat evaluation and x-rays of the right femur.  Call with concerns or questions.  Follow-Up Instructions: Return in about 6 weeks (around 01/13/2021).   Orders:  Orders Placed This Encounter  Procedures   XR FEMUR, MIN 2 VIEWS RIGHT   No orders of the defined types were placed in this encounter.   Imaging: XR FEMUR, MIN 2 VIEWS RIGHT  Result Date: 12/02/2020 X-rays demonstrate bony consolidation of the fracture site.  No hardware complication.   PMFS History: Patient Active Problem List   Diagnosis Date Noted   Urinary retention 11/03/2020   TBI (traumatic brain injury) (HCC) 10/13/2020   Subdural hematoma (HCC) 10/08/2020   Acute pain due to trauma 10/08/2020   Closed right hip fracture (HCC) 10/07/2020   Abnormal gait 10/07/2020   Aortic regurgitation 10/07/2020   Aortic root dilatation (HCC) 10/07/2020   Atrial fibrillation (HCC) 10/07/2020   Chronic  obstructive pulmonary disease, unspecified (HCC) 10/07/2020   Gastroesophageal reflux disease 10/07/2020   Hardening of the aorta (main artery of the heart) (HCC) 10/07/2020   Major depression in remission (HCC) 10/07/2020   Peripheral neuropathy 10/07/2020   Pure hypercholesterolemia 10/07/2020   Solitary pulmonary nodule 10/07/2020   Thoracic aortic aneurysm without rupture (HCC) 10/07/2020   Thrombophilia (HCC) 10/07/2020   Entropion of left lower eyelid 07/01/2014   High risk medication use 02/25/2014   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   Posterior capsular opacification 06/03/2013   Encounter for long-term (current) use of other medications 05/28/2013   Dyslipidemia 01/15/2013   Other giant cell arteritis (HCC) 06/07/2012   Vision loss 04/29/2012   Past Medical History:  Diagnosis Date   Aortic atherosclerosis (HCC)    Atrial fibrillation (HCC)    Giant cell arteritis (HCC)    Osteopenia    Pure hypercholesterolemia    Thoracic aortic aneurysm (HCC)    Thrombophilia (HCC)     Family History  Problem Relation Age of Onset   Parkinson's disease Father    Cancer Brother    Stroke Brother     Past Surgical History:  Procedure Laterality Date   APPENDECTOMY     CRANIOTOMY Left 10/08/2020   Procedure: LEFT BURR HOLE DRAINAGE OF SUBDURAL HEMATOMA;  Surgeon: Jadene Pierini, MD;  Location: [facility] OR;  Service: Neurosurgery;  Laterality: Left;   HERNIA REPAIR     INTRAMEDULLARY (IM) NAIL INTERTROCHANTERIC Right 10/08/2020   Procedure: RIGHT INTRAMEDULLARY (IM) NAIL INTERTROCHANTRIC WITH PREVENA WOUND VAC APPLICATION;  Surgeon: Tarry Kos, MD;  Location: [facility] OR;  Service: Orthopedics;  Laterality: Right;   Social History   Occupational History   Not on file  Tobacco Use   Smoking status: Former   Smokeless tobacco: Never  Vaping Use   Vaping Use: Never used  Substance and Sexual Activity   Alcohol use: Yes    Alcohol/week: 3.0 standard  drinks    Types: 3 Glasses of wine per week   Drug use: No   Sexual activity: Not on file

## 2020-12-03 ENCOUNTER — Other Ambulatory Visit: Payer: Medicare PPO

## 2020-12-03 ENCOUNTER — Ambulatory Visit
Admission: RE | Admit: 2020-12-03 | Discharge: 2020-12-03 | Disposition: A | Discharge status: Home / Self Care | Payer: Medicare PPO | Source: Ambulatory Visit | Attending: Neurological Surgery | Admitting: Neurological Surgery

## 2020-12-03 DIAGNOSIS — I6200 Nontraumatic subdural hemorrhage, unspecified: Secondary | ICD-10-CM | POA: Diagnosis not present

## 2020-12-03 DIAGNOSIS — G319 Degenerative disease of nervous system, unspecified: Secondary | ICD-10-CM | POA: Diagnosis not present

## 2020-12-03 DIAGNOSIS — S065XAA Traumatic subdural hemorrhage with loss of consciousness status unknown, initial encounter: Secondary | ICD-10-CM

## 2020-12-03 DIAGNOSIS — S065X9A Traumatic subdural hemorrhage with loss of consciousness of unspecified duration, initial encounter: Secondary | ICD-10-CM

## 2020-12-04 DIAGNOSIS — S069X9A Unspecified intracranial injury with loss of consciousness of unspecified duration, initial encounter: Secondary | ICD-10-CM | POA: Diagnosis not present

## 2020-12-07 ENCOUNTER — Ambulatory Visit: Payer: Medicare PPO | Admitting: Neurology

## 2020-12-07 ENCOUNTER — Other Ambulatory Visit: Payer: Self-pay | Admitting: Urology

## 2020-12-07 DIAGNOSIS — J449 Chronic obstructive pulmonary disease, unspecified: Secondary | ICD-10-CM | POA: Diagnosis not present

## 2020-12-07 DIAGNOSIS — E7800 Pure hypercholesterolemia, unspecified: Secondary | ICD-10-CM | POA: Diagnosis not present

## 2020-12-07 DIAGNOSIS — H547 Unspecified visual loss: Secondary | ICD-10-CM | POA: Diagnosis not present

## 2020-12-07 DIAGNOSIS — K5900 Constipation, unspecified: Secondary | ICD-10-CM | POA: Diagnosis not present

## 2020-12-07 DIAGNOSIS — I4891 Unspecified atrial fibrillation: Secondary | ICD-10-CM | POA: Diagnosis not present

## 2020-12-07 DIAGNOSIS — S72141D Displaced intertrochanteric fracture of right femur, subsequent encounter for closed fracture with routine healing: Secondary | ICD-10-CM | POA: Diagnosis not present

## 2020-12-07 DIAGNOSIS — M4854XD Collapsed vertebra, not elsewhere classified, thoracic region, subsequent encounter for fracture with routine healing: Secondary | ICD-10-CM | POA: Diagnosis not present

## 2020-12-07 DIAGNOSIS — M316 Other giant cell arteritis: Secondary | ICD-10-CM | POA: Diagnosis not present

## 2020-12-07 DIAGNOSIS — S065X0D Traumatic subdural hemorrhage without loss of consciousness, subsequent encounter: Secondary | ICD-10-CM | POA: Diagnosis not present

## 2020-12-08 DIAGNOSIS — M4854XD Collapsed vertebra, not elsewhere classified, thoracic region, subsequent encounter for fracture with routine healing: Secondary | ICD-10-CM | POA: Diagnosis not present

## 2020-12-08 DIAGNOSIS — S72141D Displaced intertrochanteric fracture of right femur, subsequent encounter for closed fracture with routine healing: Secondary | ICD-10-CM | POA: Diagnosis not present

## 2020-12-08 DIAGNOSIS — J449 Chronic obstructive pulmonary disease, unspecified: Secondary | ICD-10-CM | POA: Diagnosis not present

## 2020-12-08 DIAGNOSIS — K5900 Constipation, unspecified: Secondary | ICD-10-CM | POA: Diagnosis not present

## 2020-12-08 DIAGNOSIS — M316 Other giant cell arteritis: Secondary | ICD-10-CM | POA: Diagnosis not present

## 2020-12-08 DIAGNOSIS — I4891 Unspecified atrial fibrillation: Secondary | ICD-10-CM | POA: Diagnosis not present

## 2020-12-08 DIAGNOSIS — E7800 Pure hypercholesterolemia, unspecified: Secondary | ICD-10-CM | POA: Diagnosis not present

## 2020-12-08 DIAGNOSIS — H547 Unspecified visual loss: Secondary | ICD-10-CM | POA: Diagnosis not present

## 2020-12-08 DIAGNOSIS — S065X0D Traumatic subdural hemorrhage without loss of consciousness, subsequent encounter: Secondary | ICD-10-CM | POA: Diagnosis not present

## 2020-12-09 DIAGNOSIS — J449 Chronic obstructive pulmonary disease, unspecified: Secondary | ICD-10-CM | POA: Diagnosis not present

## 2020-12-09 DIAGNOSIS — M25551 Pain in right hip: Secondary | ICD-10-CM | POA: Diagnosis not present

## 2020-12-09 DIAGNOSIS — N320 Bladder-neck obstruction: Secondary | ICD-10-CM | POA: Diagnosis not present

## 2020-12-09 DIAGNOSIS — I482 Chronic atrial fibrillation, unspecified: Secondary | ICD-10-CM | POA: Diagnosis not present

## 2020-12-09 DIAGNOSIS — R410 Disorientation, unspecified: Secondary | ICD-10-CM | POA: Diagnosis not present

## 2020-12-09 DIAGNOSIS — S065X9D Traumatic subdural hemorrhage with loss of consciousness of unspecified duration, subsequent encounter: Secondary | ICD-10-CM | POA: Diagnosis not present

## 2020-12-10 DIAGNOSIS — J449 Chronic obstructive pulmonary disease, unspecified: Secondary | ICD-10-CM | POA: Diagnosis not present

## 2020-12-10 DIAGNOSIS — M316 Other giant cell arteritis: Secondary | ICD-10-CM | POA: Diagnosis not present

## 2020-12-10 DIAGNOSIS — S72141D Displaced intertrochanteric fracture of right femur, subsequent encounter for closed fracture with routine healing: Secondary | ICD-10-CM | POA: Diagnosis not present

## 2020-12-10 DIAGNOSIS — E7800 Pure hypercholesterolemia, unspecified: Secondary | ICD-10-CM | POA: Diagnosis not present

## 2020-12-10 DIAGNOSIS — H547 Unspecified visual loss: Secondary | ICD-10-CM | POA: Diagnosis not present

## 2020-12-10 DIAGNOSIS — K5900 Constipation, unspecified: Secondary | ICD-10-CM | POA: Diagnosis not present

## 2020-12-10 DIAGNOSIS — I4891 Unspecified atrial fibrillation: Secondary | ICD-10-CM | POA: Diagnosis not present

## 2020-12-10 DIAGNOSIS — M4854XD Collapsed vertebra, not elsewhere classified, thoracic region, subsequent encounter for fracture with routine healing: Secondary | ICD-10-CM | POA: Diagnosis not present

## 2020-12-10 DIAGNOSIS — S065X0D Traumatic subdural hemorrhage without loss of consciousness, subsequent encounter: Secondary | ICD-10-CM | POA: Diagnosis not present

## 2020-12-11 ENCOUNTER — Emergency Department (HOSPITAL_BASED_OUTPATIENT_CLINIC_OR_DEPARTMENT_OTHER)
Admission: EM | Admit: 2020-12-11 | Discharge: 2020-12-11 | Disposition: A | Discharge status: Home / Self Care | Payer: Medicare PPO | Attending: Emergency Medicine | Admitting: Emergency Medicine

## 2020-12-11 ENCOUNTER — Emergency Department (HOSPITAL_BASED_OUTPATIENT_CLINIC_OR_DEPARTMENT_OTHER): Payer: Medicare PPO | Admitting: Radiology

## 2020-12-11 ENCOUNTER — Other Ambulatory Visit: Payer: Self-pay

## 2020-12-11 ENCOUNTER — Encounter (HOSPITAL_BASED_OUTPATIENT_CLINIC_OR_DEPARTMENT_OTHER): Payer: Self-pay | Admitting: Emergency Medicine

## 2020-12-11 DIAGNOSIS — I4891 Unspecified atrial fibrillation: Secondary | ICD-10-CM | POA: Diagnosis not present

## 2020-12-11 DIAGNOSIS — N179 Acute kidney failure, unspecified: Secondary | ICD-10-CM | POA: Diagnosis not present

## 2020-12-11 DIAGNOSIS — R059 Cough, unspecified: Secondary | ICD-10-CM | POA: Insufficient documentation

## 2020-12-11 DIAGNOSIS — Z87891 Personal history of nicotine dependence: Secondary | ICD-10-CM | POA: Insufficient documentation

## 2020-12-11 DIAGNOSIS — N390 Urinary tract infection, site not specified: Secondary | ICD-10-CM | POA: Insufficient documentation

## 2020-12-11 DIAGNOSIS — Z20822 Contact with and (suspected) exposure to covid-19: Secondary | ICD-10-CM | POA: Insufficient documentation

## 2020-12-11 DIAGNOSIS — T83511A Infection and inflammatory reaction due to indwelling urethral catheter, initial encounter: Secondary | ICD-10-CM

## 2020-12-11 DIAGNOSIS — R5383 Other fatigue: Secondary | ICD-10-CM | POA: Diagnosis present

## 2020-12-11 DIAGNOSIS — R509 Fever, unspecified: Secondary | ICD-10-CM | POA: Diagnosis not present

## 2020-12-11 DIAGNOSIS — I517 Cardiomegaly: Secondary | ICD-10-CM | POA: Diagnosis not present

## 2020-12-11 LAB — CBC
HCT: 31.5 % — ABNORMAL LOW (ref 39.0–52.0)
Hemoglobin: 9.9 g/dL — ABNORMAL LOW (ref 13.0–17.0)
MCH: 27.7 pg (ref 26.0–34.0)
MCHC: 31.4 g/dL (ref 30.0–36.0)
MCV: 88.2 fL (ref 80.0–100.0)
Platelets: 316 10*3/uL (ref 150–400)
RBC: 3.57 MIL/uL — ABNORMAL LOW (ref 4.22–5.81)
RDW: 17.2 % — ABNORMAL HIGH (ref 11.5–15.5)
WBC: 12.4 10*3/uL — ABNORMAL HIGH (ref 4.0–10.5)
nRBC: 0 % (ref 0.0–0.2)

## 2020-12-11 LAB — URINALYSIS, ROUTINE W REFLEX MICROSCOPIC
Bilirubin Urine: NEGATIVE
Glucose, UA: NEGATIVE mg/dL
Ketones, ur: NEGATIVE mg/dL
Nitrite: NEGATIVE
Protein, ur: 100 mg/dL — AB
RBC / HPF: >50 RBC/hpf — ABNORMAL HIGH (ref 0–5)
Specific Gravity, Urine: 1.025 (ref 1.005–1.030)
pH: 7 (ref 5.0–8.0)

## 2020-12-11 LAB — BASIC METABOLIC PANEL
Anion gap: 9 (ref 5–15)
BUN: 25 mg/dL — ABNORMAL HIGH (ref 8–23)
CO2: 23 mmol/L (ref 22–32)
Calcium: 8.6 mg/dL — ABNORMAL LOW (ref 8.9–10.3)
Chloride: 101 mmol/L (ref 98–111)
Creatinine, Ser: 1.74 mg/dL — ABNORMAL HIGH (ref 0.61–1.24)
GFR, Estimated: 37 mL/min — ABNORMAL LOW (ref >60)
Glucose, Bld: 130 mg/dL — ABNORMAL HIGH (ref 70–99)
Potassium: 4.5 mmol/L (ref 3.5–5.1)
Sodium: 133 mmol/L — ABNORMAL LOW (ref 135–145)

## 2020-12-11 LAB — RESP PANEL BY RT-PCR (FLU A&B, COVID) ARPGX2
Influenza A by PCR: NEGATIVE
Influenza B by PCR: NEGATIVE
SARS Coronavirus 2 by RT PCR: NEGATIVE

## 2020-12-11 MED ORDER — LACTATED RINGERS IV BOLUS
1000.0000 mL | Freq: Once | INTRAVENOUS | Status: AC
  Administered 2020-12-11: 1000 mL via INTRAVENOUS

## 2020-12-11 MED ORDER — CEPHALEXIN 500 MG PO CAPS: 500.0000 mg | ORAL_CAPSULE | Freq: Four times a day (QID) | ORAL | 0 refills | Status: DC

## 2020-12-11 NOTE — ED Triage Notes (Signed)
Pt arrives with wife who reports pt has felt sick since Wednesday but noticed a fever today. Pt recently broke right hip, foley catheter in place. Endorses mild SOB, lack of appetite. Off of blood thinners since fall that required neuro consult.

## 2020-12-11 NOTE — ED Provider Notes (Signed)
[facility]   CSN: [id number] Arrival date & time: 12/11/20  1411     History No chief complaint on file.   Nathan Mitchell is a 87 y.o. male.  HPI  87 year old male with a history of atrial fibrillation, not on Eliquis, Foley catheter in place postoperatively after an IM nail of an intertrochanteric femur fracture on 10/08/2020 who follows with orthopedics who presents to the emergency department with generalized malaise, decreased p.o. intake, subjective fever earlier today.  He endorses mild shortness of breath and has had decreased appetite which is caused some dehydration.  He denies any chest pain.  He denies abdominal pain, nausea, vomiting.  He denies any suprapubic pain or discomfort.  Past Medical History:  Diagnosis Date   Aortic atherosclerosis (HCC)    Atrial fibrillation (HCC)    Giant cell arteritis (HCC)    Osteopenia    Pure hypercholesterolemia    Thoracic aortic aneurysm (HCC)    Thrombophilia (HCC)     Patient Active Problem List   Diagnosis Date Noted   Urinary retention 11/03/2020   TBI (traumatic brain injury) (HCC) 10/13/2020   Subdural hematoma (HCC) 10/08/2020   Acute pain due to trauma 10/08/2020   Closed right hip fracture (HCC) 10/07/2020   Abnormal gait 10/07/2020   Aortic regurgitation 10/07/2020   Aortic root dilatation (HCC) 10/07/2020   Atrial fibrillation (HCC) 10/07/2020   Chronic obstructive pulmonary disease, unspecified (HCC) 10/07/2020   Gastroesophageal reflux disease 10/07/2020   Hardening of the aorta (main artery of the heart) (HCC) 10/07/2020   Major depression in remission (HCC) 10/07/2020   Peripheral neuropathy 10/07/2020   Pure hypercholesterolemia 10/07/2020   Solitary pulmonary nodule 10/07/2020   Thoracic aortic aneurysm without rupture (HCC) 10/07/2020   Thrombophilia (HCC) 10/07/2020   Entropion of left lower eyelid 07/01/2014   High risk medication use 02/25/2014    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   Posterior capsular opacification 06/03/2013   Encounter for long-term (current) use of other medications 05/28/2013   Dyslipidemia 01/15/2013   Other giant cell arteritis (HCC) 06/07/2012   Vision loss 04/29/2012    Past Surgical History:  Procedure Laterality Date   APPENDECTOMY     CRANIOTOMY Left 10/08/2020   Procedure: LEFT BURR HOLE DRAINAGE OF SUBDURAL HEMATOMA;  Surgeon: Jadene Pierinistergard, Thomas A, MD;  Location: [facility] OR;  Service: Neurosurgery;  Laterality: Left;   HERNIA REPAIR     INTRAMEDULLARY (IM) NAIL INTERTROCHANTERIC Right 10/08/2020   Procedure: RIGHT INTRAMEDULLARY (IM) NAIL INTERTROCHANTRIC WITH PREVENA WOUND VAC APPLICATION;  Surgeon: Tarry KosXu, Naiping M, MD;  Location: [facility] OR;  Service: Orthopedics;  Laterality: Right;       Family History  Problem Relation Age of Onset   Parkinson's disease Father    Cancer Brother    Stroke Brother     Social History   Tobacco Use   Smoking status: Former   Smokeless tobacco: Never  Building services engineerVaping Use   Vaping Use: Never used  Substance Use Topics   Alcohol use: Yes    Alcohol/week: 3.0 standard drinks    Types: 3 Glasses of wine per week   Drug use: No    Home Medications Prior to Admission medications   Medication Sig Start Date End Date Taking? Authorizing Provider  cephALEXin (KEFLEX) 500 MG capsule Take 1 capsule (500 mg total) by mouth 4 (four) times daily. 12/11/20  Yes Ernie AvenaLawsing, Rita Prom, MD  acetaminophen (TYLENOL) 325 MG tablet Take 2  tablets (650 mg total) by mouth 3 (three) times daily. 11/04/20   Love, Evlyn Kanner, PA-C  CRANBERRY EXTRACT PO Take 1 capsule by mouth at bedtime.    [provider]  docusate sodium (COLACE) 100 MG capsule Take 1 capsule (100 mg total) by mouth 2 (two) times daily. Need to purchase over the counter 11/04/20   Love, Evlyn Kanner, PA-C  escitalopram (LEXAPRO) 20 MG tablet Take 20 mg by mouth daily.    [provider]   famotidine (PEPCID) 40 MG tablet Take 40 mg by mouth daily as needed. 01/30/19   [provider]  ferrous gluconate (FERGON) 240 (27 FE) MG tablet Take 240 mg by mouth every Monday, Wednesday, and Friday.    [provider]  folic acid (FOLVITE) 1 MG tablet Take 1 mg by mouth daily. 12/02/14   [provider]  melatonin 3 MG TABS tablet Take 1 tablet (3 mg total) by mouth at bedtime. Purchase over the counter 11/04/20   Love, Evlyn Kanner, PA-C  methocarbamol (ROBAXIN) 500 MG tablet TAKE ONE TABLET DAILY FOR MUSCLE SPASM 11/26/20   Cristie Hem, PA-C  methotrexate (RHEUMATREX) 2.5 MG tablet Take 20 mg by mouth every Friday. 04/24/15   [provider]  Omeprazole 20 MG TBEC Take 20 mg by mouth daily.     [provider]  polyethylene glycol (MIRALAX / GLYCOLAX) 17 g packet Take 17 g by mouth 2 (two) times daily. Need to purchase over the counter 11/04/20   Love, Evlyn Kanner, PA-C  tamsulosin (FLOMAX) 0.4 MG CAPS capsule Take 0.8 mg by mouth daily.    [provider]  traMADol (ULTRAM) 50 MG tablet Take 1 tablet (50 mg total) by mouth 3 (three) times daily as needed. 11/27/20   Cristie Hem, PA-C  vitamin B-12 (CYANOCOBALAMIN) 1000 MCG tablet Take 1,000 mcg by mouth daily.    [provider]    Allergies    Tiotropium bromide monohydrate and Umeclidinium-vilanterol  Review of Systems   Review of Systems  Constitutional:  Positive for appetite change, fatigue and fever. Negative for chills.  HENT:  Negative for ear pain and sore throat.   Eyes:  Negative for pain and visual disturbance.  Respiratory:  Positive for shortness of breath. Negative for cough.   Cardiovascular:  Negative for chest pain and palpitations.  Gastrointestinal:  Negative for abdominal pain, blood in stool, constipation, diarrhea, nausea and vomiting.  Genitourinary:  Negative for dysuria and hematuria.  Musculoskeletal:  Negative for arthralgias and back pain.   Skin:  Negative for color change and rash.  Neurological:  Negative for seizures and syncope.  All other systems reviewed and are negative.  Physical Exam Updated Vital Signs BP (!) 145/60   Pulse 88   Temp 98.3 F (36.8 C) (Oral)   Resp (!) 22   Ht 6\' 1"  (1.854 m)   Wt 80.3 kg   SpO2 99%   BMI 23.35 kg/m   Physical Exam Vitals and nursing note reviewed. Exam conducted with a chaperone present.  Constitutional:      Appearance: He is well-developed.  HENT:     Head: Normocephalic and atraumatic.  Eyes:     Conjunctiva/sclera: Conjunctivae normal.  Cardiovascular:     Rate and Rhythm: Normal rate and regular rhythm.     Heart sounds: No murmur heard. Pulmonary:     Effort: Pulmonary effort is normal. No respiratory distress.     Breath sounds: Normal breath sounds.  Abdominal:     Palpations: Abdomen is soft.     Tenderness: There is no abdominal tenderness.  Genitourinary:    Comments: Rectal exam performed with no surrounding inflammatory changes.  No evidence of scrotal cellulitis or perineal tenderness.  Penis unremarkable.  Foley catheter in place draining yellow urine. Musculoskeletal:     Cervical back: Neck supple.  Skin:    General: Skin is warm and dry.  Neurological:     Mental Status: He is alert.     GCS: GCS eye subscore is 4. GCS verbal subscore is 5. GCS motor subscore is 6.     Cranial Nerves: Cranial nerves are intact.     Sensory: Sensation is intact.     Motor: Motor function is intact.     Coordination: Coordination is intact.     Gait: Gait is intact.    ED Results / Procedures / Treatments   Labs (all labs ordered are listed, but only abnormal results are displayed) Labs Reviewed  BASIC METABOLIC PANEL - Abnormal; Notable for the following components:      Result Value   Sodium 133 (*)    Glucose, Bld 130 (*)    BUN 25 (*)    Creatinine, Ser 1.74 (*)    Calcium 8.6 (*)    GFR, Estimated 37 (*)    All other components within normal  limits  CBC - Abnormal; Notable for the following components:   WBC 12.4 (*)    RBC 3.57 (*)    Hemoglobin 9.9 (*)    HCT 31.5 (*)    RDW 17.2 (*)    All other components within normal limits  URINALYSIS, ROUTINE W REFLEX MICROSCOPIC - Abnormal; Notable for the following components:   Hgb urine dipstick SMALL (*)    Protein, ur 100 (*)    Leukocytes,Ua LARGE (*)    RBC / HPF >50 (*)    Bacteria, UA FEW (*)    All other components within normal limits  RESP PANEL BY RT-PCR (FLU A&B, COVID) [id number]    EKG EKG Interpretation  Date/Time:  Friday December 11 2020 14:25:58 EDT Ventricular Rate:  104 PR Interval:    QRS Duration: 152 QT Interval:  390 QTC Calculation: 512 R Axis:   18 Text Interpretation: Atrial fibrillation with rapid ventricular response Right bundle branch block Abnormal ECG Confirmed by Vanetta Mulders [phone]) on 12/11/2020 2:31:19 PM  Radiology DG Chest 1 View  Result Date: 12/11/2020 CLINICAL DATA:  Fever EXAM: CHEST  1 VIEW COMPARISON:  Chest radiograph 10/07/2020 FINDINGS: The heart is enlarged, unchanged. The mediastinal contours are stable. There is calcified atherosclerotic plaque of the aortic arch. Slightly increased interstitial markings bilaterally is unchanged. There is no focal consolidation or pulmonary edema. There is no pleural effusion or pneumothorax. Retrocardiac opacity is consistent with a large hiatal hernia. There is degenerative change of both shoulders. There is no acute osseous abnormality. IMPRESSION: Stable cardiomegaly with no radiographic evidence of acute cardiopulmonary process. Electronically Signed   By: Lesia Hausen M.D.   On: 12/11/2020 15:23    Procedures Procedures   Medications Ordered in ED Medications  lactated ringers bolus 1,000 mL (0 mLs Intravenous Stopped 12/11/20 1738)    ED Course  I have reviewed the triage vital signs and the nursing notes.  Pertinent labs & imaging results that were available during my care of  the patient were reviewed by me and considered in my medical decision making (see chart for details).  MDM Rules/Calculators/A&P                           87 year old male with a history of atrial fibrillation, not on Eliquis, Foley catheter in place postoperatively after an IM nail of an intertrochanteric femur fracture on 10/08/2020 who follows with orthopedics who presents to the emergency department with generalized malaise, decreased p.o. intake, subjective fever earlier today.  He endorses mild shortness of breath and has had decreased appetite which is caused some dehydration.  He denies any chest pain.  He denies abdominal pain, nausea, vomiting.  He denies any suprapubic pain or discomfort.  On arrival, the patient was afebrile, hemodynamically stable, not tachycardic or tachypneic, saturating well on room air.  He presents with generalized weakness and fatigue and concern for dehydration in the setting of poor oral intake.  Differential diagnosis includes urinary tract infection due to Foley catheter in place, pyelonephritis, viral URI, COVID-19, hypovolemia from poor oral intake, electrolyte abnormality.  No recent falls, neurologically intact with no concern for acute CVA at this time.  Work-up initiated which revealed rate controlled atrial fibrillation, urinalysis with large leukocytes, few bacteria, 21-50 WBCs, BMP concerning for an AKI with a creatinine of 1.74 and a BUN of 25.  The patient was administered an LR bolus for volume resuscitation.  He has a mild leukocytosis to 12.4, stable anemia to 9.9, no platelet abnormality.  COVID-19 and influenza PCR testing resulted negative.  Chest x-ray without focal consolidation or other acute cardiopulmonary process, stable cardiomegaly present.  EKG revealed atrial fibrillation without RVR, rate controlled, no ST segment changes.  Patient denies chest pain at this time.  He denies shortness of breath.  He was informed of his finding of AKI in  the setting of likely dehydration.  He was offered admission for further volume resuscitation but ultimately decided on a plan for continued management of his symptoms outpatient.  He was prescribed Keflex due to concern for developing urinary tract infection and advised to follow-up with his PCP.  Final Clinical Impression(s) / ED Diagnoses Final diagnoses:  Cough  AKI (acute kidney injury) (HCC)  Urinary tract infection associated with indwelling urethral catheter, initial encounter [facility])    Rx / DC Orders ED Discharge Orders          Ordered    cephALEXin (KEFLEX) 500 MG capsule  4 times daily        12/11/20 Miguel Aschoff, MD 12/12/20 2202

## 2020-12-14 DIAGNOSIS — R5381 Other malaise: Secondary | ICD-10-CM | POA: Diagnosis not present

## 2020-12-14 DIAGNOSIS — N3000 Acute cystitis without hematuria: Secondary | ICD-10-CM | POA: Diagnosis not present

## 2020-12-14 DIAGNOSIS — R051 Acute cough: Secondary | ICD-10-CM | POA: Diagnosis not present

## 2020-12-14 DIAGNOSIS — M25551 Pain in right hip: Secondary | ICD-10-CM | POA: Diagnosis not present

## 2020-12-14 DIAGNOSIS — Z8619 Personal history of other infectious and parasitic diseases: Secondary | ICD-10-CM | POA: Diagnosis not present

## 2020-12-15 ENCOUNTER — Emergency Department (HOSPITAL_COMMUNITY): Payer: Medicare PPO

## 2020-12-15 ENCOUNTER — Encounter (HOSPITAL_COMMUNITY): Payer: Self-pay

## 2020-12-15 ENCOUNTER — Other Ambulatory Visit: Payer: Self-pay

## 2020-12-15 ENCOUNTER — Inpatient Hospital Stay (HOSPITAL_COMMUNITY)
Admission: EM | Admit: 2020-12-15 | Discharge: 2020-12-31 | DRG: 698 | Disposition: E | Payer: Medicare PPO | Attending: Internal Medicine | Admitting: Internal Medicine

## 2020-12-15 DIAGNOSIS — F419 Anxiety disorder, unspecified: Secondary | ICD-10-CM | POA: Diagnosis present

## 2020-12-15 DIAGNOSIS — Z20822 Contact with and (suspected) exposure to covid-19: Secondary | ICD-10-CM | POA: Diagnosis present

## 2020-12-15 DIAGNOSIS — Z79899 Other long term (current) drug therapy: Secondary | ICD-10-CM

## 2020-12-15 DIAGNOSIS — J42 Unspecified chronic bronchitis: Secondary | ICD-10-CM | POA: Diagnosis not present

## 2020-12-15 DIAGNOSIS — F05 Delirium due to known physiological condition: Secondary | ICD-10-CM | POA: Diagnosis not present

## 2020-12-15 DIAGNOSIS — J9600 Acute respiratory failure, unspecified whether with hypoxia or hypercapnia: Secondary | ICD-10-CM

## 2020-12-15 DIAGNOSIS — J90 Pleural effusion, not elsewhere classified: Secondary | ICD-10-CM

## 2020-12-15 DIAGNOSIS — S069XAA Unspecified intracranial injury with loss of consciousness status unknown, initial encounter: Secondary | ICD-10-CM | POA: Diagnosis present

## 2020-12-15 DIAGNOSIS — R0602 Shortness of breath: Secondary | ICD-10-CM | POA: Diagnosis not present

## 2020-12-15 DIAGNOSIS — N3289 Other specified disorders of bladder: Secondary | ICD-10-CM | POA: Diagnosis not present

## 2020-12-15 DIAGNOSIS — A419 Sepsis, unspecified organism: Secondary | ICD-10-CM | POA: Diagnosis present

## 2020-12-15 DIAGNOSIS — R000 Tachycardia, unspecified: Secondary | ICD-10-CM | POA: Diagnosis not present

## 2020-12-15 DIAGNOSIS — M8580 Other specified disorders of bone density and structure, unspecified site: Secondary | ICD-10-CM | POA: Diagnosis present

## 2020-12-15 DIAGNOSIS — Z66 Do not resuscitate: Secondary | ICD-10-CM | POA: Diagnosis not present

## 2020-12-15 DIAGNOSIS — K219 Gastro-esophageal reflux disease without esophagitis: Secondary | ICD-10-CM | POA: Diagnosis not present

## 2020-12-15 DIAGNOSIS — E1122 Type 2 diabetes mellitus with diabetic chronic kidney disease: Secondary | ICD-10-CM | POA: Diagnosis present

## 2020-12-15 DIAGNOSIS — R6520 Severe sepsis without septic shock: Secondary | ICD-10-CM | POA: Diagnosis present

## 2020-12-15 DIAGNOSIS — R918 Other nonspecific abnormal finding of lung field: Secondary | ICD-10-CM | POA: Diagnosis not present

## 2020-12-15 DIAGNOSIS — Z978 Presence of other specified devices: Secondary | ICD-10-CM | POA: Diagnosis not present

## 2020-12-15 DIAGNOSIS — S7292XD Unspecified fracture of left femur, subsequent encounter for closed fracture with routine healing: Secondary | ICD-10-CM

## 2020-12-15 DIAGNOSIS — G9341 Metabolic encephalopathy: Secondary | ICD-10-CM | POA: Diagnosis present

## 2020-12-15 DIAGNOSIS — N400 Enlarged prostate without lower urinary tract symptoms: Secondary | ICD-10-CM | POA: Diagnosis present

## 2020-12-15 DIAGNOSIS — Z886 Allergy status to analgesic agent status: Secondary | ICD-10-CM

## 2020-12-15 DIAGNOSIS — D6489 Other specified anemias: Secondary | ICD-10-CM | POA: Diagnosis present

## 2020-12-15 DIAGNOSIS — D6859 Other primary thrombophilia: Secondary | ICD-10-CM | POA: Diagnosis present

## 2020-12-15 DIAGNOSIS — I4891 Unspecified atrial fibrillation: Secondary | ICD-10-CM | POA: Diagnosis not present

## 2020-12-15 DIAGNOSIS — D638 Anemia in other chronic diseases classified elsewhere: Secondary | ICD-10-CM | POA: Diagnosis present

## 2020-12-15 DIAGNOSIS — R404 Transient alteration of awareness: Secondary | ICD-10-CM | POA: Diagnosis not present

## 2020-12-15 DIAGNOSIS — T380X5A Adverse effect of glucocorticoids and synthetic analogues, initial encounter: Secondary | ICD-10-CM | POA: Diagnosis not present

## 2020-12-15 DIAGNOSIS — E785 Hyperlipidemia, unspecified: Secondary | ICD-10-CM | POA: Diagnosis not present

## 2020-12-15 DIAGNOSIS — F325 Major depressive disorder, single episode, in full remission: Secondary | ICD-10-CM | POA: Diagnosis present

## 2020-12-15 DIAGNOSIS — J189 Pneumonia, unspecified organism: Secondary | ICD-10-CM | POA: Diagnosis not present

## 2020-12-15 DIAGNOSIS — R0603 Acute respiratory distress: Secondary | ICD-10-CM | POA: Diagnosis not present

## 2020-12-15 DIAGNOSIS — Z4682 Encounter for fitting and adjustment of non-vascular catheter: Secondary | ICD-10-CM | POA: Diagnosis not present

## 2020-12-15 DIAGNOSIS — J690 Pneumonitis due to inhalation of food and vomit: Secondary | ICD-10-CM | POA: Diagnosis not present

## 2020-12-15 DIAGNOSIS — K449 Diaphragmatic hernia without obstruction or gangrene: Secondary | ICD-10-CM | POA: Diagnosis not present

## 2020-12-15 DIAGNOSIS — N136 Pyonephrosis: Secondary | ICD-10-CM | POA: Diagnosis present

## 2020-12-15 DIAGNOSIS — S069X9A Unspecified intracranial injury with loss of consciousness of unspecified duration, initial encounter: Secondary | ICD-10-CM | POA: Diagnosis present

## 2020-12-15 DIAGNOSIS — M316 Other giant cell arteritis: Secondary | ICD-10-CM | POA: Diagnosis present

## 2020-12-15 DIAGNOSIS — N170 Acute kidney failure with tubular necrosis: Secondary | ICD-10-CM | POA: Diagnosis present

## 2020-12-15 DIAGNOSIS — T83511A Infection and inflammatory reaction due to indwelling urethral catheter, initial encounter: Principal | ICD-10-CM | POA: Diagnosis present

## 2020-12-15 DIAGNOSIS — Z87891 Personal history of nicotine dependence: Secondary | ICD-10-CM

## 2020-12-15 DIAGNOSIS — Z820 Family history of epilepsy and other diseases of the nervous system: Secondary | ICD-10-CM

## 2020-12-15 DIAGNOSIS — E7800 Pure hypercholesterolemia, unspecified: Secondary | ICD-10-CM | POA: Diagnosis present

## 2020-12-15 DIAGNOSIS — I517 Cardiomegaly: Secondary | ICD-10-CM | POA: Diagnosis not present

## 2020-12-15 DIAGNOSIS — T17800A Unspecified foreign body in other parts of respiratory tract causing asphyxiation, initial encounter: Secondary | ICD-10-CM

## 2020-12-15 DIAGNOSIS — S069X3S Unspecified intracranial injury with loss of consciousness of 1 hour to 5 hours 59 minutes, sequela: Secondary | ICD-10-CM

## 2020-12-15 DIAGNOSIS — I4811 Longstanding persistent atrial fibrillation: Secondary | ICD-10-CM | POA: Diagnosis not present

## 2020-12-15 DIAGNOSIS — J441 Chronic obstructive pulmonary disease with (acute) exacerbation: Secondary | ICD-10-CM | POA: Diagnosis present

## 2020-12-15 DIAGNOSIS — R4182 Altered mental status, unspecified: Secondary | ICD-10-CM | POA: Diagnosis not present

## 2020-12-15 DIAGNOSIS — E870 Hyperosmolality and hypernatremia: Secondary | ICD-10-CM | POA: Diagnosis present

## 2020-12-15 DIAGNOSIS — R062 Wheezing: Secondary | ICD-10-CM | POA: Diagnosis not present

## 2020-12-15 DIAGNOSIS — R0600 Dyspnea, unspecified: Secondary | ICD-10-CM

## 2020-12-15 DIAGNOSIS — M62838 Other muscle spasm: Secondary | ICD-10-CM | POA: Diagnosis present

## 2020-12-15 DIAGNOSIS — J438 Other emphysema: Secondary | ICD-10-CM | POA: Diagnosis not present

## 2020-12-15 DIAGNOSIS — Z7189 Other specified counseling: Secondary | ICD-10-CM | POA: Diagnosis not present

## 2020-12-15 DIAGNOSIS — Z823 Family history of stroke: Secondary | ICD-10-CM

## 2020-12-15 DIAGNOSIS — I728 Aneurysm of other specified arteries: Secondary | ICD-10-CM | POA: Diagnosis not present

## 2020-12-15 DIAGNOSIS — E871 Hypo-osmolality and hyponatremia: Secondary | ICD-10-CM | POA: Diagnosis not present

## 2020-12-15 DIAGNOSIS — R52 Pain, unspecified: Secondary | ICD-10-CM

## 2020-12-15 DIAGNOSIS — I472 Ventricular tachycardia: Secondary | ICD-10-CM | POA: Diagnosis present

## 2020-12-15 DIAGNOSIS — K5900 Constipation, unspecified: Secondary | ICD-10-CM | POA: Diagnosis present

## 2020-12-15 DIAGNOSIS — I712 Thoracic aortic aneurysm, without rupture: Secondary | ICD-10-CM | POA: Diagnosis present

## 2020-12-15 DIAGNOSIS — J449 Chronic obstructive pulmonary disease, unspecified: Secondary | ICD-10-CM | POA: Diagnosis not present

## 2020-12-15 DIAGNOSIS — R1310 Dysphagia, unspecified: Secondary | ICD-10-CM | POA: Diagnosis not present

## 2020-12-15 DIAGNOSIS — I442 Atrioventricular block, complete: Secondary | ICD-10-CM | POA: Diagnosis not present

## 2020-12-15 DIAGNOSIS — H469 Unspecified optic neuritis: Secondary | ICD-10-CM | POA: Diagnosis present

## 2020-12-15 DIAGNOSIS — R0902 Hypoxemia: Secondary | ICD-10-CM | POA: Diagnosis present

## 2020-12-15 DIAGNOSIS — N390 Urinary tract infection, site not specified: Secondary | ICD-10-CM

## 2020-12-15 DIAGNOSIS — A4152 Sepsis due to Pseudomonas: Secondary | ICD-10-CM | POA: Diagnosis present

## 2020-12-15 DIAGNOSIS — D631 Anemia in chronic kidney disease: Secondary | ICD-10-CM | POA: Diagnosis present

## 2020-12-15 DIAGNOSIS — Z4659 Encounter for fitting and adjustment of other gastrointestinal appliance and device: Secondary | ICD-10-CM

## 2020-12-15 DIAGNOSIS — Z6822 Body mass index (BMI) 22.0-22.9, adult: Secondary | ICD-10-CM

## 2020-12-15 DIAGNOSIS — R627 Adult failure to thrive: Secondary | ICD-10-CM | POA: Diagnosis present

## 2020-12-15 DIAGNOSIS — I672 Cerebral atherosclerosis: Secondary | ICD-10-CM | POA: Diagnosis not present

## 2020-12-15 DIAGNOSIS — Z515 Encounter for palliative care: Secondary | ICD-10-CM

## 2020-12-15 DIAGNOSIS — N182 Chronic kidney disease, stage 2 (mild): Secondary | ICD-10-CM | POA: Diagnosis present

## 2020-12-15 DIAGNOSIS — E1142 Type 2 diabetes mellitus with diabetic polyneuropathy: Secondary | ICD-10-CM | POA: Diagnosis present

## 2020-12-15 DIAGNOSIS — I4819 Other persistent atrial fibrillation: Secondary | ICD-10-CM | POA: Diagnosis present

## 2020-12-15 DIAGNOSIS — R451 Restlessness and agitation: Secondary | ICD-10-CM | POA: Diagnosis present

## 2020-12-15 DIAGNOSIS — R0689 Other abnormalities of breathing: Secondary | ICD-10-CM | POA: Diagnosis not present

## 2020-12-15 DIAGNOSIS — R410 Disorientation, unspecified: Secondary | ICD-10-CM | POA: Diagnosis not present

## 2020-12-15 DIAGNOSIS — B359 Dermatophytosis, unspecified: Secondary | ICD-10-CM | POA: Diagnosis present

## 2020-12-15 DIAGNOSIS — G9340 Encephalopathy, unspecified: Secondary | ICD-10-CM | POA: Diagnosis not present

## 2020-12-15 DIAGNOSIS — E860 Dehydration: Secondary | ICD-10-CM | POA: Diagnosis present

## 2020-12-15 DIAGNOSIS — Z781 Physical restraint status: Secondary | ICD-10-CM

## 2020-12-15 DIAGNOSIS — I700 Atherosclerosis of aorta: Secondary | ICD-10-CM | POA: Diagnosis present

## 2020-12-15 DIAGNOSIS — I6200 Nontraumatic subdural hemorrhage, unspecified: Secondary | ICD-10-CM | POA: Diagnosis not present

## 2020-12-15 DIAGNOSIS — Z888 Allergy status to other drugs, medicaments and biological substances status: Secondary | ICD-10-CM

## 2020-12-15 DIAGNOSIS — N179 Acute kidney failure, unspecified: Secondary | ICD-10-CM | POA: Diagnosis not present

## 2020-12-15 DIAGNOSIS — N1330 Unspecified hydronephrosis: Secondary | ICD-10-CM | POA: Diagnosis not present

## 2020-12-15 DIAGNOSIS — S065X9D Traumatic subdural hemorrhage with loss of consciousness of unspecified duration, subsequent encounter: Secondary | ICD-10-CM

## 2020-12-15 DIAGNOSIS — R54 Age-related physical debility: Secondary | ICD-10-CM | POA: Diagnosis present

## 2020-12-15 LAB — I-STAT VENOUS BLOOD GAS, ED
Acid-Base Excess: 1 mmol/L (ref 0.0–2.0)
Bicarbonate: 24.3 mmol/L (ref 20.0–28.0)
Calcium, Ion: 1.07 mmol/L — ABNORMAL LOW (ref 1.15–1.40)
HCT: 27 % — ABNORMAL LOW (ref 39.0–52.0)
Hemoglobin: 9.2 g/dL — ABNORMAL LOW (ref 13.0–17.0)
O2 Saturation: 41 %
Potassium: 4.8 mmol/L (ref 3.5–5.1)
Sodium: 130 mmol/L — ABNORMAL LOW (ref 135–145)
TCO2: 25 mmol/L (ref 22–32)
pCO2, Ven: 32.1 mmHg — ABNORMAL LOW (ref 44.0–60.0)
pH, Ven: 7.487 — ABNORMAL HIGH (ref 7.250–7.430)
pO2, Ven: 21 mmHg — CL (ref 32.0–45.0)

## 2020-12-15 LAB — LACTIC ACID, PLASMA
Lactic Acid, Venous: 1.1 mmol/L (ref 0.5–1.9)
Lactic Acid, Venous: 1.5 mmol/L (ref 0.5–1.9)

## 2020-12-15 LAB — CBC WITH DIFFERENTIAL/PLATELET
Abs Immature Granulocytes: 0.14 10*3/uL — ABNORMAL HIGH (ref 0.00–0.07)
Basophils Absolute: 0 10*3/uL (ref 0.0–0.1)
Basophils Relative: 0 %
Eosinophils Absolute: 0 10*3/uL (ref 0.0–0.5)
Eosinophils Relative: 0 %
HCT: 28 % — ABNORMAL LOW (ref 39.0–52.0)
Hemoglobin: 8.9 g/dL — ABNORMAL LOW (ref 13.0–17.0)
Immature Granulocytes: 1 %
Lymphocytes Relative: 3 %
Lymphs Abs: 0.5 10*3/uL — ABNORMAL LOW (ref 0.7–4.0)
MCH: 27.6 pg (ref 26.0–34.0)
MCHC: 31.8 g/dL (ref 30.0–36.0)
MCV: 87 fL (ref 80.0–100.0)
Monocytes Absolute: 1.2 10*3/uL — ABNORMAL HIGH (ref 0.1–1.0)
Monocytes Relative: 7 %
Neutro Abs: 15 10*3/uL — ABNORMAL HIGH (ref 1.7–7.7)
Neutrophils Relative %: 89 %
Platelets: 264 10*3/uL (ref 150–400)
RBC: 3.22 MIL/uL — ABNORMAL LOW (ref 4.22–5.81)
RDW: 17.3 % — ABNORMAL HIGH (ref 11.5–15.5)
WBC: 16.9 10*3/uL — ABNORMAL HIGH (ref 4.0–10.5)
nRBC: 0 % (ref 0.0–0.2)

## 2020-12-15 LAB — PROTIME-INR
INR: 1.3 — ABNORMAL HIGH (ref 0.8–1.2)
Prothrombin Time: 16.1 seconds — ABNORMAL HIGH (ref 11.4–15.2)

## 2020-12-15 LAB — COMPREHENSIVE METABOLIC PANEL
ALT: 13 U/L (ref 0–44)
AST: 13 U/L — ABNORMAL LOW (ref 15–41)
Albumin: 2.5 g/dL — ABNORMAL LOW (ref 3.5–5.0)
Alkaline Phosphatase: 61 U/L (ref 38–126)
Anion gap: 9 (ref 5–15)
BUN: 26 mg/dL — ABNORMAL HIGH (ref 8–23)
CO2: 23 mmol/L (ref 22–32)
Calcium: 8.2 mg/dL — ABNORMAL LOW (ref 8.9–10.3)
Chloride: 97 mmol/L — ABNORMAL LOW (ref 98–111)
Creatinine, Ser: 1.79 mg/dL — ABNORMAL HIGH (ref 0.61–1.24)
GFR, Estimated: 36 mL/min — ABNORMAL LOW (ref >60)
Glucose, Bld: 112 mg/dL — ABNORMAL HIGH (ref 70–99)
Potassium: 4.9 mmol/L (ref 3.5–5.1)
Sodium: 129 mmol/L — ABNORMAL LOW (ref 135–145)
Total Bilirubin: 1 mg/dL (ref 0.3–1.2)
Total Protein: 6.1 g/dL — ABNORMAL LOW (ref 6.5–8.1)

## 2020-12-15 LAB — TROPONIN I (HIGH SENSITIVITY)
Troponin I (High Sensitivity): 26 ng/L — ABNORMAL HIGH (ref <18)
Troponin I (High Sensitivity): 25 ng/L — ABNORMAL HIGH (ref <18)

## 2020-12-15 LAB — BRAIN NATRIURETIC PEPTIDE: B Natriuretic Peptide: 344.5 pg/mL — ABNORMAL HIGH (ref 0.0–100.0)

## 2020-12-15 LAB — CDS SEROLOGY

## 2020-12-15 LAB — RESP PANEL BY RT-PCR (FLU A&B, COVID) ARPGX2
Influenza A by PCR: NEGATIVE
Influenza B by PCR: NEGATIVE
SARS Coronavirus 2 by RT PCR: NEGATIVE

## 2020-12-15 LAB — APTT: aPTT: 31 seconds (ref 24–36)

## 2020-12-15 MED ORDER — ACETAMINOPHEN 500 MG PO TABS
1000.0000 mg | ORAL_TABLET | Freq: Once | ORAL | Status: AC
  Administered 2020-12-15: 1000 mg via ORAL
  Filled 2020-12-15: qty 2

## 2020-12-15 MED ORDER — LACTATED RINGERS IV BOLUS
500.0000 mL | Freq: Once | INTRAVENOUS | Status: AC
  Administered 2020-12-15: 500 mL via INTRAVENOUS

## 2020-12-15 MED ORDER — IPRATROPIUM BROMIDE 0.02 % IN SOLN
0.5000 mg | Freq: Once | RESPIRATORY_TRACT | Status: AC
  Administered 2020-12-15: 0.5 mg via RESPIRATORY_TRACT
  Filled 2020-12-15: qty 2.5

## 2020-12-15 MED ORDER — SODIUM CHLORIDE 0.9% FLUSH
3.0000 mL | Freq: Two times a day (BID) | INTRAVENOUS | Status: DC
  Administered 2020-12-15 – 2020-12-16 (×2): 3 mL via INTRAVENOUS
  Administered 2020-12-16: 10 mL via INTRAVENOUS
  Administered 2020-12-17 – 2020-12-27 (×16): 3 mL via INTRAVENOUS

## 2020-12-15 MED ORDER — METRONIDAZOLE 500 MG/100ML IV SOLN
500.0000 mg | Freq: Once | INTRAVENOUS | Status: AC
  Administered 2020-12-15: 500 mg via INTRAVENOUS
  Filled 2020-12-15: qty 100

## 2020-12-15 MED ORDER — SODIUM CHLORIDE 0.9 % IV SOLN
2.0000 g | Freq: Two times a day (BID) | INTRAVENOUS | Status: DC
  Administered 2020-12-16 – 2020-12-17 (×3): 2 g via INTRAVENOUS
  Filled 2020-12-15 (×3): qty 2

## 2020-12-15 MED ORDER — STERILE WATER FOR INJECTION IJ SOLN
INTRAMUSCULAR | Status: AC
  Administered 2020-12-15: 10 mL
  Filled 2020-12-15: qty 10

## 2020-12-15 MED ORDER — ACETAMINOPHEN 650 MG RE SUPP
650.0000 mg | Freq: Four times a day (QID) | RECTAL | Status: DC | PRN
  Administered 2020-12-16 – 2020-12-20 (×2): 650 mg via RECTAL
  Filled 2020-12-15 (×2): qty 1

## 2020-12-15 MED ORDER — ALBUTEROL SULFATE (2.5 MG/3ML) 0.083% IN NEBU
INHALATION_SOLUTION | RESPIRATORY_TRACT | Status: AC
  Administered 2020-12-15: 2.5 mg
  Filled 2020-12-15: qty 3

## 2020-12-15 MED ORDER — ACETAMINOPHEN 325 MG PO TABS
650.0000 mg | ORAL_TABLET | Freq: Four times a day (QID) | ORAL | Status: DC | PRN
  Administered 2020-12-23 – 2020-12-27 (×3): 650 mg via ORAL
  Filled 2020-12-15 (×3): qty 2

## 2020-12-15 MED ORDER — VANCOMYCIN HCL 1500 MG/300ML IV SOLN
1500.0000 mg | Freq: Once | INTRAVENOUS | Status: AC
  Administered 2020-12-15: 1500 mg via INTRAVENOUS
  Filled 2020-12-15: qty 300

## 2020-12-15 MED ORDER — METRONIDAZOLE 500 MG/100ML IV SOLN
500.0000 mg | Freq: Three times a day (TID) | INTRAVENOUS | Status: DC
  Administered 2020-12-15 – 2020-12-17 (×5): 500 mg via INTRAVENOUS
  Filled 2020-12-15 (×5): qty 100

## 2020-12-15 MED ORDER — OLANZAPINE 10 MG IM SOLR
2.5000 mg | Freq: Once | INTRAMUSCULAR | Status: AC | PRN
  Administered 2020-12-15: 2.5 mg via INTRAMUSCULAR
  Filled 2020-12-15 (×2): qty 10

## 2020-12-15 MED ORDER — TAMSULOSIN HCL 0.4 MG PO CAPS
0.8000 mg | ORAL_CAPSULE | Freq: Every day | ORAL | Status: DC
  Administered 2020-12-16 – 2020-12-18 (×3): 0.8 mg via ORAL
  Filled 2020-12-15 (×3): qty 2

## 2020-12-15 MED ORDER — VANCOMYCIN HCL 1750 MG/350ML IV SOLN
1750.0000 mg | INTRAVENOUS | Status: DC
  Filled 2020-12-15: qty 350

## 2020-12-15 MED ORDER — LACTATED RINGERS IV BOLUS (SEPSIS)
500.0000 mL | Freq: Once | INTRAVENOUS | Status: AC
  Administered 2020-12-15: 500 mL via INTRAVENOUS

## 2020-12-15 MED ORDER — SODIUM CHLORIDE 0.9 % IV SOLN
2.0000 g | Freq: Once | INTRAVENOUS | Status: AC
  Administered 2020-12-15: 2 g via INTRAVENOUS
  Filled 2020-12-15: qty 2

## 2020-12-15 MED ORDER — IPRATROPIUM-ALBUTEROL 0.5-2.5 (3) MG/3ML IN SOLN
3.0000 mL | Freq: Once | RESPIRATORY_TRACT | Status: AC
  Administered 2020-12-15: 3 mL via RESPIRATORY_TRACT

## 2020-12-15 MED ORDER — ENOXAPARIN SODIUM 40 MG/0.4ML IJ SOSY
40.0000 mg | PREFILLED_SYRINGE | INTRAMUSCULAR | Status: DC
  Administered 2020-12-16 – 2020-12-19 (×4): 40 mg via SUBCUTANEOUS
  Filled 2020-12-15 (×5): qty 0.4

## 2020-12-15 MED ORDER — LACTATED RINGERS IV SOLN: INTRAVENOUS | Status: DC

## 2020-12-15 MED ORDER — IPRATROPIUM-ALBUTEROL 0.5-2.5 (3) MG/3ML IN SOLN
3.0000 mL | Freq: Four times a day (QID) | RESPIRATORY_TRACT | Status: DC
  Administered 2020-12-16 – 2020-12-19 (×9): 3 mL via RESPIRATORY_TRACT
  Filled 2020-12-15 (×13): qty 3

## 2020-12-15 MED ORDER — ALBUTEROL SULFATE (2.5 MG/3ML) 0.083% IN NEBU
5.0000 mg | INHALATION_SOLUTION | Freq: Once | RESPIRATORY_TRACT | Status: AC
  Administered 2020-12-15: 5 mg via RESPIRATORY_TRACT
  Filled 2020-12-15: qty 6

## 2020-12-15 MED ORDER — ALBUTEROL SULFATE (2.5 MG/3ML) 0.083% IN NEBU
2.5000 mg | INHALATION_SOLUTION | RESPIRATORY_TRACT | Status: DC | PRN
  Administered 2020-12-16 (×2): 2.5 mg via RESPIRATORY_TRACT
  Filled 2020-12-15 (×2): qty 3

## 2020-12-15 MED ORDER — POLYETHYLENE GLYCOL 3350 17 G PO PACK: 17.0000 g | PACK | Freq: Every day | ORAL | Status: DC | PRN

## 2020-12-15 NOTE — ED Provider Notes (Addendum)
[facility] Provider [facility]   CSN: [id number] Arrival date & time: 12/23/2020  1337     History Chief Complaint  Patient presents with  . Altered Mental Status    Nathan Mitchell is a 87 y.o. male.  Patient is an 87 year old male with history of atrial fibrillation not on anticoagulation, Foley catheter in place postoperatively after an IM nail of an intertrochanteric femur fracture on 10/08/2020, COPD, aortic root dilation, with recent visit to the emergency room 4 days ago for poor oral intake, AKI and possible UTI and was given Keflex.  However patient has been at home and they called today because in the last 24 hours he has become altered he is still not eating or drinking well and now he is becoming more combative.  When EMS arrived they report that he was tachypneic, wheezing, altered and tachycardic.  They attempted to give albuterol neb but patient kept trying to bite them and bite on the tubing.  Family did not note any vomiting or diarrhea.  It is unclear how much antibiotic he has been taking.  Patient will respond to his name when asking for history and he denies having pain anywhere but given that he is altered which is abnormal for him he is not able to give further reliable history.  The history is provided by the EMS personnel. The history is limited by the condition of the patient.  Altered Mental Status     Past Medical History:  Diagnosis Date  . Aortic atherosclerosis (HCC)   . Atrial fibrillation (HCC)   . Giant cell arteritis (HCC)   . Osteopenia   . Pure hypercholesterolemia   . Thoracic aortic aneurysm (HCC)   . Thrombophilia [facility](HCC)     Patient Active Problem List   Diagnosis Date Noted  . Urinary retention 11/03/2020  . TBI (traumatic brain injury) (HCC) 10/13/2020  . Subdural hematoma (HCC) 10/08/2020  . Acute pain due to trauma 10/08/2020  . Closed right hip fracture (HCC) 10/07/2020  . Abnormal gait 10/07/2020   . Aortic regurgitation 10/07/2020  . Aortic root dilatation (HCC) 10/07/2020  . Atrial fibrillation (HCC) 10/07/2020  . Chronic obstructive pulmonary disease, unspecified (HCC) 10/07/2020  . Gastroesophageal reflux disease 10/07/2020  . Hardening of the aorta (main artery of the heart) (HCC) 10/07/2020  . Major depression in remission (HCC) 10/07/2020  . Peripheral neuropathy 10/07/2020  . Pure hypercholesterolemia 10/07/2020  . Solitary pulmonary nodule 10/07/2020  . Thoracic aortic aneurysm without rupture (HCC) 10/07/2020  . Thrombophilia (HCC) 10/07/2020  . Entropion of left lower eyelid 07/01/2014  . High risk medication use 02/25/2014  . 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 Posterior capsular opacification 06/03/2013  . Encounter for long-term (current) use of other medications 05/28/2013  . Dyslipidemia 01/15/2013  . Other giant cell arteritis (HCC) 06/07/2012  . Vision loss 04/29/2012    Past Surgical History:  Procedure Laterality Date  . APPENDECTOMY    . CRANIOTOMY Left 10/08/2020   Procedure: LEFT BURR HOLE DRAINAGE OF SUBDURAL HEMATOMA;  Surgeon: Jadene Pierinistergard, Thomas A, MD;  Location: [facility] OR;  Service: Neurosurgery;  Laterality: Left;  . HERNIA REPAIR    . INTRAMEDULLARY (IM) NAIL INTERTROCHANTERIC Right 10/08/2020   Procedure: RIGHT INTRAMEDULLARY (IM) NAIL INTERTROCHANTRIC WITH PREVENA WOUND VAC APPLICATION;  Surgeon: Tarry KosXu, Naiping M, MD;  Location: [facility] OR;  Service: Orthopedics;  Laterality: Right;       Family History  Problem Relation Age  of Onset  . Parkinson's disease Father   . Cancer Brother   . Stroke Brother     Social History   Tobacco Use  . Smoking status: Former  . Smokeless tobacco: Never  Vaping Use  . Vaping Use: Never used  Substance Use Topics  . Alcohol use: Yes    Alcohol/week: 3.0 standard drinks    Types: 3 Glasses of wine per week  . Drug use: No    Home Medications Prior to Admission medications    Medication Sig Start Date End Date Taking? Authorizing Provider  acetaminophen (TYLENOL) 325 MG tablet Take 2 tablets (650 mg total) by mouth 3 (three) times daily. Patient taking differently: Take 650 mg by mouth every 6 (six) hours as needed for moderate pain. 11/04/20   Love, Evlyn Kanner, PA-C  albuterol (VENTOLIN HFA) 108 (90 Base) MCG/ACT inhaler Inhale 1-2 puffs into the lungs every 6 (six) hours as needed for wheezing or shortness of breath.    [provider]  cephALEXin (KEFLEX) 500 MG capsule Take 1 capsule (500 mg total) by mouth 4 (four) times daily. 12/11/20   Ernie Avena, MD  CRANBERRY EXTRACT PO Take 1 capsule by mouth at bedtime.    [provider]  diclofenac Sodium (VOLTAREN) 1 % GEL Apply 2 g topically daily as needed (hip pain).    [provider]  docusate sodium (COLACE) 100 MG capsule Take 1 capsule (100 mg total) by mouth 2 (two) times daily. Need to purchase over the counter Patient taking differently: Take 100 mg by mouth daily as needed for mild constipation. 11/04/20   Love, Evlyn Kanner, PA-C  escitalopram (LEXAPRO) 20 MG tablet Take 20 mg by mouth daily.    [provider]  famotidine (PEPCID) 40 MG tablet Take 40 mg by mouth at bedtime. 01/30/19   [provider]  ferrous gluconate (FERGON) 240 (27 FE) MG tablet Take 240 mg by mouth every Monday, Wednesday, and Friday.    [provider]  folic acid (FOLVITE) 1 MG tablet Take 1 mg by mouth daily. 12/02/14   [provider]  melatonin 3 MG TABS tablet Take 1 tablet (3 mg total) by mouth at bedtime. Purchase over the counter Patient taking differently: Take 3 mg by mouth at bedtime as needed (sleep). 11/04/20   Love, Evlyn Kanner, PA-C  methocarbamol (ROBAXIN) 500 MG tablet TAKE ONE TABLET DAILY FOR MUSCLE SPASM Patient not taking: No sig reported 11/26/20   Cristie Hem, PA-C  methotrexate (RHEUMATREX) 2.5 MG tablet Take 20 mg by mouth every Friday. 04/24/15   [provider]  Omeprazole 20 MG TBEC Take 20 mg by mouth daily.     [provider]  polyethylene glycol (MIRALAX / GLYCOLAX) 17 g packet Take 17 g by mouth 2 (two) times daily. Need to purchase over the counter Patient taking differently: Take 17 g by mouth daily as needed for moderate constipation. Need to purchase over the counter 11/04/20   Love, Evlyn Kanner, PA-C  tamsulosin (FLOMAX) 0.4 MG CAPS capsule Take 0.8 mg by mouth daily.    [provider]  traMADol (ULTRAM) 50 MG tablet Take 1 tablet (50 mg total) by mouth 3 (three) times daily as needed. Patient not taking: No sig reported 11/27/20   Cristie Hem, PA-C  vitamin B-12 (CYANOCOBALAMIN) 1000 MCG tablet Take 1,000 mcg by mouth daily.    [provider]    Allergies    Nsaids, Tiotropium bromide monohydrate,  and Umeclidinium-vilanterol  Review of Systems   Review of Systems  Unable to perform ROS: Mental status change   Physical Exam Updated Vital Signs BP 114/72 (BP Location: Left Arm)   Pulse (!) 111   Temp (!) 100.7 F (38.2 C) (Oral)   Resp (!) 30   Ht 6\' 1"  (1.854 m)   Wt 80.3 kg   SpO2 100%   BMI 23.36 kg/m   Physical Exam Vitals and nursing note reviewed.  Constitutional:      General: He is not in acute distress.    Appearance: He is well-developed.  HENT:     Head: Normocephalic and atraumatic.     Mouth/Throat:     Mouth: Mucous membranes are dry.  Eyes:     Conjunctiva/sclera: Conjunctivae normal.     Pupils: Pupils are equal, round, and reactive to light.  Cardiovascular:     Rate and Rhythm: Regular rhythm. Tachycardia present.     Heart sounds: No murmur heard. Pulmonary:     Effort: Tachypnea and accessory muscle usage present. No respiratory distress.     Breath sounds: Wheezing present. No rales.  Abdominal:     General: There is no distension.     Palpations: Abdomen is soft.     Tenderness: There is no abdominal tenderness. There is no guarding or rebound.   Genitourinary:    Comments: Foley catheter in place draining a dark urine Musculoskeletal:        General: No tenderness. Normal range of motion.     Cervical back: Normal range of motion and neck supple.  Skin:    General: Skin is warm and dry.     Findings: No erythema or rash.  Neurological:     Mental Status: He is alert.     Comments: Oriented to self only but will intermittently follow commands  Psychiatric:     Comments: Directable at this time    ED Results / Procedures / Treatments   Labs (all labs ordered are listed, but only abnormal results are displayed) Labs Reviewed  CBC WITH DIFFERENTIAL/PLATELET - Abnormal; Notable for the following components:      Result Value   WBC 16.9 (*)    RBC 3.22 (*)    Hemoglobin 8.9 (*)    HCT 28.0 (*)    RDW 17.3 (*)    Neutro Abs 15.0 (*)    Lymphs Abs 0.5 (*)    Monocytes Absolute 1.2 (*)    Abs Immature Granulocytes 0.14 (*)    All other components within normal limits  COMPREHENSIVE METABOLIC PANEL - Abnormal; Notable for the following components:   Sodium 129 (*)    Chloride 97 (*)    Glucose, Bld 112 (*)    BUN 26 (*)    Creatinine, Ser 1.79 (*)    Calcium 8.2 (*)    Total Protein 6.1 (*)    Albumin 2.5 (*)    AST 13 (*)    GFR, Estimated 36 (*)    All other components within normal limits  PROTIME-INR - Abnormal; Notable for the following components:   Prothrombin Time 16.1 (*)    INR 1.3 (*)    All other components within normal limits  I-STAT VENOUS BLOOD GAS, ED - Abnormal; Notable for the following components:   pH, Ven 7.487 (*)    pCO2, Ven 32.1 (*)    pO2, Ven 21.0 (*)    Sodium 130 (*)    Calcium, Ion 1.07 (*)  HCT 27.0 (*)    Hemoglobin 9.2 (*)    All other components within normal limits  TROPONIN I (HIGH SENSITIVITY) - Abnormal; Notable for the following components:   Troponin I (High Sensitivity) 26 (*)    All other components within normal limits  RESP PANEL BY RT-PCR (FLU A&B, COVID)  ARPGX2  CULTURE, BLOOD (ROUTINE X 2)  CULTURE, BLOOD (ROUTINE X 2)  URINE CULTURE  LACTIC ACID, PLASMA  APTT  CDS SEROLOGY  BRAIN NATRIURETIC PEPTIDE  LACTIC ACID, PLASMA  MISCELLANEOUS GENETIC TEST  TROPONIN I (HIGH SENSITIVITY)    EKG EKG Interpretation  Date/Time:  Tuesday December 15 2020 13:48:25 EDT Ventricular Rate:  103 PR Interval:    QRS Duration: 152 QT Interval:  427 QTC Calculation: 537 R Axis:   114 Text Interpretation: Atrial fibrillation RBBB and LPFB Artifact in lead(s) I II aVR aVL aVF No significant change since last tracing Confirmed by Gwyneth Sprout ([id number]) on 12/12/2020 2:08:04 PM  Radiology DG Chest Port 1 View  Result Date: 11/30/2020 CLINICAL DATA:  Shortness of breath and altered mental status for 1 day EXAM: PORTABLE CHEST 1 VIEW COMPARISON:  12/11/2020 FINDINGS: Midline trachea. Mild cardiomegaly. Atherosclerosis in the transverse aorta. No pleural effusion or pneumothorax. Similar interstitial thickening. Mild bibasilar airspace disease. IMPRESSION: Cardiomegaly with chronic interstitial thickening, likely related to smoking or chronic bronchitis. Mild bibasilar Airspace disease, likely atelectasis. Aortic Atherosclerosis (ICD10-I70.0). Electronically Signed   By: Jeronimo Greaves M.D.   On: 12/26/2020 14:21    Procedures Procedures   Medications Ordered in ED Medications  albuterol (PROVENTIL) (2.5 MG/3ML) 0.083% nebulizer solution (has no administration in time range)    ED Course  I have reviewed the triage vital signs and the nursing notes.  Pertinent labs & imaging results that were available during my care of the patient were reviewed by me and considered in my medical decision making (see chart for details).    MDM Rules/Calculators/A&P                           Patient is an elderly male presenting today after recently being seen in the emergency room 4 days ago due to decreased oral intake, general malaise and with known Foley  catheter that has been in place since June because of a hip surgery.  Patient was discharged home on Keflex but noted to have an AKI most likely thought to be due to dehydration.  However the patient did not want to stay at that time.  Last 24 hours patient has become altered.  Patient on exam here is wheezing diffusely, tachypneic but satting 100% on 2 L.  He has prior history of COPD but does not use inhalers regularly.  EMS attempted to give patient a neb but he was biting and uncooperative.  He denies any pain but does not give further history.  Concern for sepsis from possible UTI versus pneumonia or other source.  Sepsis protocol was initiated.  VBG without evidence for hypercarbia at this time.  Because he is altered will most likely need a head CT but at this time is unable to participate and will not lay still.  He has no focal symptoms and suspect acute encephalopathy.  He is febrile here to 100.7.  We will give a gentle bolus as patient does not have evidence of fluid overload.  Will give albuterol and Atrovent for wheezing and tachypnea.  Labs otherwise pending.  4 days ago  patient was COVID-negative.  2:27 PM Wheezing somewhat improved after intial neb and will give second.  3:27 PM Chest x-ray with atelectasis but no other acute findings, lactic acid within normal limits, CBC with leukocytosis of 16,000, hemoglobin of 8.9 which is slightly lower than his baseline of 9, CMP with hyponatremia of 129, persistent AKI with creatinine of 1.73 from his baseline of 0.7 which is not significantly changed from 4 days ago, troponin of 26 and PT/INR without acute findings.  VBG without signs of hypercarbia today.  Head CT pending to rule out other acute pathology that would suggest why he is altered however suspect is related to sepsis and fever.  Wife is present now and reports he has taken his dose of Keflex every day and even this morning but his symptoms are not improving.  He was covered with  broad-spectrum antibiotics.  He has no abdominal pain at this time.  He was given a liter bolus and started on a rate and has been hemodynamically stable.  We will plan on admitting for sepsis.  3:51 PM Wheezing now resolved after 2nd albuterol/atrovent treatment  MDM   Amount and/or Complexity of Data Reviewed Clinical lab tests: ordered and reviewed Tests in the radiology section of CPT: ordered and reviewed Tests in the medicine section of CPT: ordered and reviewed Independent visualization of images, tracings, or specimens: yes  Patient Progress Patient progress: stable    Final Clinical Impression(s) / ED Diagnoses Final diagnoses:  Sepsis with acute renal failure without septic shock, due to unspecified organism, unspecified acute renal failure type (HCC)  Metabolic encephalopathy  COPD exacerbation [facility])    Rx / DC Orders ED Discharge Orders     None        Gwyneth Sprout, MD 12/10/2020 1531    Gwyneth Sprout, MD 12/10/2020 1551

## 2020-12-15 NOTE — ED Notes (Signed)
MD at bedside.  Pt is started to to decline more in mentation.  Pt urinary Output is not productive.  MD aware.  Foley cath securing device was replaced and foley balloon deflated and advanced to assure foley was in the proper position.  No issues with placement I will bladder scan pt.

## 2020-12-15 NOTE — ED Triage Notes (Signed)
Per EMS pt was trying to bite them nurse and NP was a the seen private house call duty

## 2020-12-15 NOTE — Progress Notes (Signed)
Elink following Code Sepsis. 

## 2020-12-15 NOTE — Progress Notes (Signed)
Pharmacy Antibiotic Note  Nathan Mitchell is a 87 y.o. male admitted on 12/24/2020 with sepsis.  Pharmacy has been consulted for vancomycin and cefepime dosing. Patient's Scr is elevated from baseline around 1.  Plan: Vancomycin 1500mg  x1   Vancomycin 1750 mg IV Q 48 hrs. Goal AUC 400-550. Expected AUC: 478.7 SCr used: 1.76 Cefepime 2g IV q12h Monitor renal function, clinical status, and antibiotic plan.  Height: 6\' 1"  (185.4 cm) Weight: 80.3 kg (177 lb 0.5 oz) IBW/kg (Calculated) : 79.9  Temp (24hrs), Avg:100.7 F (38.2 C), Min:100.7 F (38.2 C), Max:100.7 F (38.2 C)  Recent Labs  Lab 12/11/20 1433 12/28/2020 1425  WBC 12.4* 16.9*  CREATININE 1.74* 1.79*    Estimated Creatinine Clearance: 32.2 mL/min (A) (by C-G formula based on SCr of 1.79 mg/dL (H)).    Allergies  Allergen Reactions   Nsaids     Due to afib   Tiotropium Bromide Monohydrate     Other reaction(s): ineffecticve   Umeclidinium-Vilanterol     Other reaction(s): ineffective    Antimicrobials this admission: Vanc 8/16 >>  Cefepime 8/16 >>  Flagyl x1 in ED  Dose adjustments this admission: N/A  Microbiology results: 8/16 BCx: collected 8/16 UCx: collected   Thank you for allowing pharmacy to be a part of this patient's care.  9/16, PharmD PGY1 Pharmacy Resident  Please check AMION for all [facility] pharmacy phone numbers After 10:00 PM call main pharmacy [phone]

## 2020-12-15 NOTE — ED Triage Notes (Signed)
Within the last 24 hours pt has become more altered mentation wife stated had kidney infection waned to be admitted pt refused went home with po antibiotics

## 2020-12-15 NOTE — H&P (Signed)
History and Physical   Nathan Mitchell ZOX:[id number] DOB: February 18, 1931 DOA: 12/05/2020  PCP: Merlene Laughter, MD   Patient coming from: Home  Chief Complaint: Altered mental status  HPI: Nathan Mitchell is a 87 y.o. male with medical history significant of aortic regurgitation, giant cell arteritis, optic neuropathy, COPD, atrial fibrillation, hyperlipidemia, GERD, depression, TBI, thoracic aortic aneurysm who presents with worsening mental status. History obtained with assistance of chart review and family due to patient's encephalopathy.  Patient was seen in the emergency department on 8/12 and evaluation there showed evidence of AKI and developing UTI in the setting of decreased p.o. intake also with a Foley in place postop from a femur surgery that he is following with Ortho for.  At that time admission was encouraged but was refused. Patient was discharged with p.o. antibiotics which she has been taking.  However for the past day he has had decline in his mental status and is becoming combative wears at baseline he is fully functional able to complete all his ADLs.  EMS was called out and noted as above he was somewhat combative also noted to be tachypneic, wheezing, altered and tachycardic.  They had some issues giving him nebulizers due to his combativeness at that time. He mentions having some "Ball Pain".  ED Course: Vital signs in the ED significant for temperature to 100.7, heart rate in the 100s to 120s, respiratory rate in the 20s, blood pressure in the 90s to 110s systolic.  Lab work-up showed CMP with sodium 129, chloride 97, BUN 26, creatinine 1.79 up from baseline of around 1, calcium 8.2 which improved when considering albumin of 2.5, protein 6.1.  CBC with hemoglobin stable at 8.9, leukocytosis at 16.9.  PT elevated mildly to 16.1 and INR mildly elevated to 1.3.  PTT within normal limits.  Troponin flat at 26 and 25 on repeat.  BNP mildly elevated to 344.  Lactic acid normal  x2.  Respiratory panel for flu and COVID-negative.  Urine culture and blood cultures are pending.  VBG performed which showed pH 7.48 and PCO2 of 32.  Chest x-ray showed cardiomegaly and chronic interstitial thickening likely due to chronic bronchitis.  CT head showed stable left subdural hematoma with possible decrease in midline shift.  No acute abnormalities.  Patient started on vancomycin, cefepime, Flagyl in the ED and received 1 L fluid bolus and started on a rate of IV fluids.  Also received multiple nebs with improvement in his wheezing.  Wife also reports some ongoing constipation.  Review of Systems: Unable to be reliably performed due to patient's encephalopathy, wife denies anything other than constipation.  Past Medical History:  Diagnosis Date   Aortic atherosclerosis (HCC)    Atrial fibrillation (HCC)    Giant cell arteritis (HCC)    Osteopenia    Pure hypercholesterolemia    Thoracic aortic aneurysm (HCC)    Thrombophilia (HCC)     Past Surgical History:  Procedure Laterality Date   APPENDECTOMY     CRANIOTOMY Left 10/08/2020   Procedure: LEFT BURR HOLE DRAINAGE OF SUBDURAL HEMATOMA;  Surgeon: Jadene Pierini, MD;  Location: [facility] OR;  Service: Neurosurgery;  Laterality: Left;   HERNIA REPAIR     INTRAMEDULLARY (IM) NAIL INTERTROCHANTERIC Right 10/08/2020   Procedure: RIGHT INTRAMEDULLARY (IM) NAIL INTERTROCHANTRIC WITH PREVENA WOUND VAC APPLICATION;  Surgeon: Tarry Kos, MD;  Location: [facility] OR;  Service: Orthopedics;  Laterality: Right;    Social History  reports that he has quit  smoking. He has never used smokeless tobacco. He reports current alcohol use of about 3.0 standard drinks per week. He reports that he does not use drugs.  Allergies  Allergen Reactions   Nsaids     Due to afib   Tiotropium Bromide Monohydrate     Other reaction(s): ineffecticve   Umeclidinium-Vilanterol     Other reaction(s): ineffective    Family History  Problem Relation Age of Onset    Parkinson's disease Father    Cancer Brother    Stroke Brother   Reviewed on admission  Prior to Admission medications   Medication Sig Start Date End Date Taking? Authorizing Provider  acetaminophen (TYLENOL) 325 MG tablet Take 2 tablets (650 mg total) by mouth 3 (three) times daily. Patient taking differently: Take 650 mg by mouth every 6 (six) hours as needed for moderate pain. 11/04/20   Love, Evlyn KannerPamela S, PA-C  albuterol (VENTOLIN HFA) 108 (90 Base) MCG/ACT inhaler Inhale 1-2 puffs into the lungs every 6 (six) hours as needed for wheezing or shortness of breath.    [provider]  CRANBERRY EXTRACT PO Take 1 capsule by mouth at bedtime.    [provider]  diclofenac Sodium (VOLTAREN) 1 % GEL Apply 2 g topically daily as needed (hip pain).    [provider]  docusate sodium (COLACE) 100 MG capsule Take 1 capsule (100 mg total) by mouth 2 (two) times daily. Need to purchase over the counter Patient taking differently: Take 100 mg by mouth daily as needed for mild constipation. 11/04/20   Love, Evlyn KannerPamela S, PA-C  escitalopram (LEXAPRO) 20 MG tablet Take 20 mg by mouth daily.    [provider]  famotidine (PEPCID) 40 MG tablet Take 40 mg by mouth at bedtime. 01/30/19   [provider]  ferrous gluconate (FERGON) 240 (27 FE) MG tablet Take 240 mg by mouth every Monday, Wednesday, and Friday.    [provider]  folic acid (FOLVITE) 1 MG tablet Take 1 mg by mouth daily. 12/02/14   [provider]  melatonin 3 MG TABS tablet Take 1 tablet (3 mg total) by mouth at bedtime. Purchase over the counter Patient taking differently: Take 3 mg by mouth at bedtime as needed (sleep). 11/04/20   Love, Evlyn KannerPamela S, PA-C  methocarbamol (ROBAXIN) 500 MG tablet TAKE ONE TABLET DAILY FOR MUSCLE SPASM Patient not taking: No sig reported 11/26/20   Cristie HemStanbery, Mary L, PA-C  methotrexate (RHEUMATREX) 2.5 MG tablet Take 20 mg by mouth every Friday. 04/24/15   [provider]  Omeprazole 20 MG TBEC Take 20 mg by mouth daily.     [provider]  polyethylene glycol (MIRALAX / GLYCOLAX) 17 g packet Take 17 g by mouth 2 (two) times daily. Need to purchase over the counter Patient taking differently: Take 17 g by mouth daily as needed for moderate constipation. Need to purchase over the counter 11/04/20   Love, Evlyn Kanneramela S, PA-C  tamsulosin (FLOMAX) 0.4 MG CAPS capsule Take 0.8 mg by mouth daily.    [provider]  vitamin B-12 (CYANOCOBALAMIN) 1000 MCG tablet Take 1,000 mcg by mouth daily.    [provider]    Physical Exam: Vitals:   Dec 04, 2020 1445 Dec 04, 2020 1500 Dec 04, 2020 1600 Dec 04, 2020 1815  BP: (!) 117/57 110/82 96/85 (!) 93/55  Pulse: (!) 103 (!) 104 (!) 122 (!) 108  Resp: (!) 26 (!) 29 (!) 27 (!) 26  Temp:      TempSrc:  SpO2: 99% 100% 97% 94%  Weight:      Height:       Physical Exam Constitutional:      General: He is not in acute distress.    Appearance: Normal appearance.  HENT:     Head: Normocephalic and atraumatic.     Mouth/Throat:     Mouth: Mucous membranes are moist.     Pharynx: Oropharynx is clear.  Eyes:     Extraocular Movements: Extraocular movements intact.     Pupils: Pupils are equal, round, and reactive to light.  Cardiovascular:     Rate and Rhythm: Regular rhythm. Tachycardia present.     Pulses: Normal pulses.     Heart sounds: Normal heart sounds.  Pulmonary:     Effort: Pulmonary effort is normal. No respiratory distress.     Breath sounds: Rhonchi present.     Comments: tachypnea Abdominal:     General: Bowel sounds are normal. There is no distension.     Palpations: Abdomen is soft.     Tenderness: There is no abdominal tenderness.  Genitourinary:    Penis: Normal.      Testes: Normal.  Musculoskeletal:        General: No swelling or deformity.  Skin:    General: Skin is warm and dry.  Neurological:     General: No focal deficit present.     Comments: Alert but  disoriented. And fidgeting    Labs on Admisson: I have personally reviewed following labs and imaging studies  CBC: Recent Labs  Lab 12/11/20 1433 23-Dec-2020 1356 12-23-2020 1425  WBC 12.4*  --  16.9*  NEUTROABS  --   --  15.0*  HGB 9.9* 9.2* 8.9*  HCT 31.5* 27.0* 28.0*  MCV 88.2  --  87.0  PLT 316  --  264    Basic Metabolic Panel: Recent Labs  Lab 12/11/20 1433 2020/12/23 1356 12-23-20 1425  NA 133* 130* 129*  K 4.5 4.8 4.9  CL 101  --  97*  CO2 23  --  23  GLUCOSE 130*  --  112*  BUN 25*  --  26*  CREATININE 1.74*  --  1.79*  CALCIUM 8.6*  --  8.2*    GFR: Estimated Creatinine Clearance: 32.2 mL/min (A) (by C-G formula based on SCr of 1.79 mg/dL (H)).  Liver Function Tests: Recent Labs  Lab Dec 23, 2020 1425  AST 13*  ALT 13  ALKPHOS 61  BILITOT 1.0  PROT 6.1*  ALBUMIN 2.5*    Urine analysis:    Component Value Date/Time   COLORURINE YELLOW 12/11/2020 1606   APPEARANCEUR CLEAR 12/11/2020 1606   LABSPEC 1.025 12/11/2020 1606   PHURINE 7.0 12/11/2020 1606   GLUCOSEU NEGATIVE 12/11/2020 1606   HGBUR SMALL (A) 12/11/2020 1606   BILIRUBINUR NEGATIVE 12/11/2020 1606   KETONESUR NEGATIVE 12/11/2020 1606   PROTEINUR 100 (A) 12/11/2020 1606   NITRITE NEGATIVE 12/11/2020 1606   LEUKOCYTESUR LARGE (A) 12/11/2020 1606    Radiological Exams on Admission: CT HEAD WO CONTRAST ( )  Result Date: Dec 23, 2020 CLINICAL DATA:  Altered mental status. EXAM: CT HEAD WITHOUT CONTRAST TECHNIQUE: Contiguous axial images were obtained from the base of the skull through the vertex without intravenous contrast. COMPARISON:  CT 12 days ago 12/03/2020 FINDINGS: Brain: Left holo hemispheric subdural hematoma has re-distributed from prior exam, currently more prominent overlying the temporal occipital lobes rather than the frontal parietal lobes. Maximal thickness is similar at 14 mm, previously 13 mm. This is predominantly  low-density, some intermediate density primarily in the pendant  portion was also seen on prior exam and appears similar. The degree of midline shift has improved, currently 2-3 mm, previously 4 mm. There is no new hemorrhage. Generalized atrophy and chronic small vessel ischemia similar. 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 No acute ischemia. The basilar cisterns remain patent. Vascular: Atherosclerosis of skullbase vasculature without hyperdense vessel or abnormal calcification. Skull: Again seen left frontal burr hole. No skull fracture or acute findings. Sinuses/Orbits: No acute findings. Bilateral cataract resection. Small mucous retention cyst and mucosal thickening in the right frontal sinus. Mastoid air cells are clear. Other: None. IMPRESSION: 1. Left-sided subdural hematoma which is not significantly changed in size over the past 12 days, with slight decreased midline shift. No evidence of acute hemorrhage. 2. Unchanged atrophy and chronic small vessel ischemia. Remote lacunar infarcts in the left thalamus. Electronically Signed   By: Narda Rutherford M.D.   On: 12/04/2020 17:01   DG Chest Port 1 View  Result Date: 12/03/2020 CLINICAL DATA:  Shortness of breath and altered mental status for 1 day EXAM: PORTABLE CHEST 1 VIEW COMPARISON:  12/11/2020 FINDINGS: Midline trachea. Mild cardiomegaly. Atherosclerosis in the transverse aorta. No pleural effusion or pneumothorax. Similar interstitial thickening. Mild bibasilar airspace disease. IMPRESSION: Cardiomegaly with chronic interstitial thickening, likely related to smoking or chronic bronchitis. Mild bibasilar Airspace disease, likely atelectasis. Aortic Atherosclerosis (ICD10-I70.0). Electronically Signed   By: Jeronimo Greaves M.D.   On: 12/22/2020 14:21    EKG: Independently reviewed.  Atrial fibrillation at 103 bpm.  Some baseline artifact in multiple leads.  Right bundle branch block.  Similar to previous.  Assessment/Plan Principal Problem:   Sepsis (HCC) Active Problems:   Atrial  fibrillation (HCC)   Chronic obstructive pulmonary disease, unspecified (HCC)   Dyslipidemia   Gastroesophageal reflux disease   Major depression in remission (HCC)   TBI (traumatic brain injury) (HCC)   Acute lower UTI  Sepsis Metabolic encephalopathy > Sent with encephalopathy, likely source is this ongoing infection and sepsis no other explanation for encephalopathy noted..  Found to have tachycardia, tachypnea, fever, elevated white count of 16.9.  Possible sources of UTI that he was previously diagnosed with on 8/12 versus, less likely, bronchitis given his wheezing and history of COPD. > Blood pressure has remained stable in the 90s to 110s in the ED with normal lactic acid as well.  Was started on cefepime, Flagyl, vancomycin in the ED. - Monitor in progressive unit - Continue with vancomycin, cefepime, Flagyl  - Trend fever curve and white count - Follow-up urine cultures and blood cultures  COPD - We will give DuoNebs every 6 hours while awake given his increased wheezing in the ED though he is saturating well at this time. - As needed albuterol   Atrial fibrillation > Rates in the 100s to 120s in the ED with this has improved with IV fluids. - Continue to monitor, not currently on any nodal blocking agents no anticoagulations at home  GERD - Continue home PPI  Depression - Holding home Lexapro in the setting of altered no status   DVT prophylaxis: Lovenox  Code Status:   Full  Family Communication:  Wife updated at bedside. Disposition Plan:   Patient is from:  Home  Anticipated DC to:  Home  Anticipated DC date:  1 to 5 days  Anticipated DC barriers: None  Consults called:  None  Admission status:  Observation, progressive  Severity of Illness:  The appropriate patient status for this patient is OBSERVATION. Observation status is judged to be reasonable and necessary in order to provide the required intensity of service to ensure the patient's safety. The  patient's presenting symptoms, physical exam findings, and initial radiographic and laboratory data in the context of their medical condition is felt to place them at decreased risk for further clinical deterioration. Furthermore, it is anticipated that the patient will be medically stable for discharge from the hospital within 2 midnights of admission. The following factors support the patient status of observation.   " The patient's presenting symptoms include altered mental status. " The physical exam findings include tachycardia, tachypnea, encephalopathy, rhonchi. " The initial radiographic and laboratory data are Lab work-up showed CMP with sodium 129, chloride 97, BUN 26, creatinine 1.79 up from baseline of around 1, calcium 8.2 which improved when considering albumin of 2.5, protein 6.1.  CBC with hemoglobin stable at 8.9, leukocytosis at 16.9.  PT elevated mildly to 16.1 and INR mildly elevated to 1.3.  PTT within normal limits.  Troponin flat at 26 and 25 on repeat.  BNP mildly elevated to 344.  Lactic acid normal x2.  Respiratory panel for flu and COVID-negative.  Urine culture and blood cultures are pending.  VBG performed which showed pH 7.48 and PCO2 of 32.  Chest x-ray showed cardiomegaly and chronic interstitial thickening likely due to chronic bronchitis.  CT head showed stable left subdural hematoma with possible decrease in midline shift.  No acute abnormalities.    Synetta Fail MD [facility]  How to contact the [facility] Attending or Consulting provider 7A - 7P or covering provider during after hours 7P -7A, for this patient?   Check the care team in [facility] and look for a) attending/consulting [facility] provider listed and b) the [facility] team listed Log into [email or web address] and use [place]'s universal password to access. If you do not have the password, please contact the hospital operator. Locate the [facility] provider you are looking for under [facility] and page to a number that  you can be directly reached. If you still have difficulty reaching the provider, please page the [facility] (Director on Call) for the Hospitalists listed on amion for assistance.  04-Jan-2021, 7:52 PM

## 2020-12-15 NOTE — ED Provider Notes (Addendum)
Physical Exam  BP 128/68   Pulse (!) 126   Temp (!) 100.7 F (38.2 C) (Oral)   Resp (!) 22   Ht 6\' 1"  (1.854 m)   Wt 80.3 kg   SpO2 99%   BMI 23.36 kg/m   Physical Exam Vitals and nursing note reviewed.  Constitutional:      Appearance: Normal appearance.  HENT:     Head: Normocephalic and atraumatic.     Right Ear: External ear normal.     Left Ear: External ear normal.     Mouth/Throat:     Mouth: Mucous membranes are dry.  Eyes:     General: No scleral icterus.    Pupils: Pupils are equal, round, and reactive to light.  Cardiovascular:     Rate and Rhythm: Regular rhythm. Tachycardia present.     Pulses: Normal pulses.     Heart sounds: Normal heart sounds.  Pulmonary:     Effort: Pulmonary effort is normal. No respiratory distress.     Breath sounds: Wheezing present.  Abdominal:     General: Abdomen is flat. There is no distension.     Palpations: Abdomen is soft.  Genitourinary:    Comments: Indwelling foley catheter  Musculoskeletal:        General: Normal range of motion.     Cervical back: Normal range of motion.     Right lower leg: No edema.     Left lower leg: No edema.  Skin:    General: Skin is warm and dry.     Capillary Refill: Capillary refill takes less than 2 seconds.  Neurological:     Mental Status: He is alert.     GCS: GCS eye subscore is 4. GCS verbal subscore is 4. GCS motor subscore is 5.     Comments: Agitated, moving extremities spontaneously and cross midline. Eyes cross midline.   Psychiatric:        Mood and Affect: Affect is labile.        Behavior: Behavior is uncooperative and agitated.    ED Course/Procedures     .Critical Care  Date/Time: 12/16/2020 2:26 AM Performed by: 12/18/2020, DO Authorized by: Sloan Leiter, DO   Critical care provider statement:    Critical care time (minutes):  41   Critical care time was exclusive of:  Separately billable procedures and treating other patients   Critical care was  necessary to treat or prevent imminent or life-threatening deterioration of the following conditions:  Sepsis   Critical care was time spent personally by me on the following activities:  Discussions with consultants, evaluation of patient's response to treatment, examination of patient, ordering and performing treatments and interventions, ordering and review of laboratory studies, ordering and review of radiographic studies, pulse oximetry, re-evaluation of patient's condition, obtaining history from patient or surrogate and review of old charts   I assumed direction of critical care for this patient from another provider in my specialty: yes     Care discussed with: admitting provider    MDM  87 yo male received at handoff with concern for metabolic encephalopathy, presumed 2/2 UTI. Hx indwelling foley catheter, lives alone @home , eval 4 days ago OSH with complaint of fatigue/myalgias/poor PO. ?UTI. AKI  Cr 1.7; pt left after admission was recommended. Taking Keflex as prescribed. Mentation worsening since yesterday pm. Tachypnea, wheezing, febrile, tachy. ?COPD w/ home inhaler. Wheezing Improved s/p nebs. Altered with EMS but improved in ED. APAP given as febrile  in ED. Labs consistent with UTI, AKI. Broad spectrum antibiotics started as concern for complicated UTI given indwelling catheter. Pt with NSTEMI likely demand ischemia from sepsis. Severe sepsis is concern w/o septic shock.   CT imaging reviewed. Subdural hematoma improving. CXR  stable. Labs reviewed.  Serious etiology considered  Recommend admission for above, family is agreeable. D/w admitting team who accepts patient for admission.   See ED note from previous physician.     Sloan Leiter, DO 12/25/2020 2016    Sloan Leiter, DO 12/16/20 [id number]

## 2020-12-16 ENCOUNTER — Observation Stay (HOSPITAL_COMMUNITY): Payer: Medicare PPO

## 2020-12-16 ENCOUNTER — Inpatient Hospital Stay (HOSPITAL_COMMUNITY): Payer: Medicare PPO

## 2020-12-16 ENCOUNTER — Encounter: Payer: Medicare PPO | Admitting: Physical Medicine & Rehabilitation

## 2020-12-16 DIAGNOSIS — I6200 Nontraumatic subdural hemorrhage, unspecified: Secondary | ICD-10-CM | POA: Diagnosis not present

## 2020-12-16 DIAGNOSIS — E870 Hyperosmolality and hypernatremia: Secondary | ICD-10-CM | POA: Diagnosis present

## 2020-12-16 DIAGNOSIS — R4182 Altered mental status, unspecified: Secondary | ICD-10-CM | POA: Diagnosis not present

## 2020-12-16 DIAGNOSIS — J42 Unspecified chronic bronchitis: Secondary | ICD-10-CM | POA: Diagnosis not present

## 2020-12-16 DIAGNOSIS — G9341 Metabolic encephalopathy: Secondary | ICD-10-CM | POA: Diagnosis present

## 2020-12-16 DIAGNOSIS — N170 Acute kidney failure with tubular necrosis: Secondary | ICD-10-CM | POA: Diagnosis present

## 2020-12-16 DIAGNOSIS — D6859 Other primary thrombophilia: Secondary | ICD-10-CM | POA: Diagnosis present

## 2020-12-16 DIAGNOSIS — Z66 Do not resuscitate: Secondary | ICD-10-CM | POA: Diagnosis not present

## 2020-12-16 DIAGNOSIS — J438 Other emphysema: Secondary | ICD-10-CM | POA: Diagnosis not present

## 2020-12-16 DIAGNOSIS — A419 Sepsis, unspecified organism: Secondary | ICD-10-CM | POA: Diagnosis not present

## 2020-12-16 DIAGNOSIS — E1142 Type 2 diabetes mellitus with diabetic polyneuropathy: Secondary | ICD-10-CM | POA: Diagnosis present

## 2020-12-16 DIAGNOSIS — F325 Major depressive disorder, single episode, in full remission: Secondary | ICD-10-CM | POA: Diagnosis present

## 2020-12-16 DIAGNOSIS — E871 Hypo-osmolality and hyponatremia: Secondary | ICD-10-CM | POA: Diagnosis not present

## 2020-12-16 DIAGNOSIS — F05 Delirium due to known physiological condition: Secondary | ICD-10-CM | POA: Diagnosis not present

## 2020-12-16 DIAGNOSIS — N390 Urinary tract infection, site not specified: Secondary | ICD-10-CM | POA: Diagnosis not present

## 2020-12-16 DIAGNOSIS — R0902 Hypoxemia: Secondary | ICD-10-CM | POA: Diagnosis not present

## 2020-12-16 DIAGNOSIS — I472 Ventricular tachycardia: Secondary | ICD-10-CM | POA: Diagnosis present

## 2020-12-16 DIAGNOSIS — I4891 Unspecified atrial fibrillation: Secondary | ICD-10-CM | POA: Diagnosis not present

## 2020-12-16 DIAGNOSIS — N136 Pyonephrosis: Secondary | ICD-10-CM | POA: Diagnosis present

## 2020-12-16 DIAGNOSIS — G9340 Encephalopathy, unspecified: Secondary | ICD-10-CM | POA: Diagnosis not present

## 2020-12-16 DIAGNOSIS — Z20822 Contact with and (suspected) exposure to covid-19: Secondary | ICD-10-CM | POA: Diagnosis present

## 2020-12-16 DIAGNOSIS — I442 Atrioventricular block, complete: Secondary | ICD-10-CM | POA: Diagnosis not present

## 2020-12-16 DIAGNOSIS — R0603 Acute respiratory distress: Secondary | ICD-10-CM

## 2020-12-16 DIAGNOSIS — J441 Chronic obstructive pulmonary disease with (acute) exacerbation: Secondary | ICD-10-CM | POA: Diagnosis present

## 2020-12-16 DIAGNOSIS — I4811 Longstanding persistent atrial fibrillation: Secondary | ICD-10-CM | POA: Diagnosis not present

## 2020-12-16 DIAGNOSIS — Z7189 Other specified counseling: Secondary | ICD-10-CM | POA: Diagnosis not present

## 2020-12-16 DIAGNOSIS — J690 Pneumonitis due to inhalation of food and vomit: Secondary | ICD-10-CM | POA: Diagnosis not present

## 2020-12-16 DIAGNOSIS — E1122 Type 2 diabetes mellitus with diabetic chronic kidney disease: Secondary | ICD-10-CM | POA: Diagnosis present

## 2020-12-16 DIAGNOSIS — I712 Thoracic aortic aneurysm, without rupture: Secondary | ICD-10-CM | POA: Diagnosis present

## 2020-12-16 DIAGNOSIS — D638 Anemia in other chronic diseases classified elsewhere: Secondary | ICD-10-CM | POA: Diagnosis present

## 2020-12-16 DIAGNOSIS — I700 Atherosclerosis of aorta: Secondary | ICD-10-CM | POA: Diagnosis present

## 2020-12-16 DIAGNOSIS — Z978 Presence of other specified devices: Secondary | ICD-10-CM

## 2020-12-16 DIAGNOSIS — A4152 Sepsis due to Pseudomonas: Secondary | ICD-10-CM | POA: Diagnosis present

## 2020-12-16 DIAGNOSIS — R6520 Severe sepsis without septic shock: Secondary | ICD-10-CM | POA: Diagnosis present

## 2020-12-16 DIAGNOSIS — I517 Cardiomegaly: Secondary | ICD-10-CM | POA: Diagnosis not present

## 2020-12-16 DIAGNOSIS — M316 Other giant cell arteritis: Secondary | ICD-10-CM | POA: Diagnosis present

## 2020-12-16 DIAGNOSIS — Z515 Encounter for palliative care: Secondary | ICD-10-CM | POA: Diagnosis not present

## 2020-12-16 DIAGNOSIS — I672 Cerebral atherosclerosis: Secondary | ICD-10-CM | POA: Diagnosis not present

## 2020-12-16 DIAGNOSIS — R410 Disorientation, unspecified: Secondary | ICD-10-CM | POA: Diagnosis not present

## 2020-12-16 DIAGNOSIS — D631 Anemia in chronic kidney disease: Secondary | ICD-10-CM | POA: Diagnosis present

## 2020-12-16 DIAGNOSIS — T83511A Infection and inflammatory reaction due to indwelling urethral catheter, initial encounter: Secondary | ICD-10-CM | POA: Diagnosis present

## 2020-12-16 LAB — CBC
HCT: 27.1 % — ABNORMAL LOW (ref 39.0–52.0)
Hemoglobin: 8.2 g/dL — ABNORMAL LOW (ref 13.0–17.0)
MCH: 27.2 pg (ref 26.0–34.0)
MCHC: 30.3 g/dL (ref 30.0–36.0)
MCV: 90 fL (ref 80.0–100.0)
Platelets: 236 10*3/uL (ref 150–400)
RBC: 3.01 MIL/uL — ABNORMAL LOW (ref 4.22–5.81)
RDW: 17.3 % — ABNORMAL HIGH (ref 11.5–15.5)
WBC: 14.7 10*3/uL — ABNORMAL HIGH (ref 4.0–10.5)
nRBC: 0 % (ref 0.0–0.2)

## 2020-12-16 LAB — COMPREHENSIVE METABOLIC PANEL
ALT: 13 U/L (ref 0–44)
AST: 14 U/L — ABNORMAL LOW (ref 15–41)
Albumin: 2.5 g/dL — ABNORMAL LOW (ref 3.5–5.0)
Alkaline Phosphatase: 53 U/L (ref 38–126)
Anion gap: 10 (ref 5–15)
BUN: 27 mg/dL — ABNORMAL HIGH (ref 8–23)
CO2: 21 mmol/L — ABNORMAL LOW (ref 22–32)
Calcium: 8.2 mg/dL — ABNORMAL LOW (ref 8.9–10.3)
Chloride: 102 mmol/L (ref 98–111)
Creatinine, Ser: 1.63 mg/dL — ABNORMAL HIGH (ref 0.61–1.24)
GFR, Estimated: 40 mL/min — ABNORMAL LOW (ref >60)
Glucose, Bld: 106 mg/dL — ABNORMAL HIGH (ref 70–99)
Potassium: 4.2 mmol/L (ref 3.5–5.1)
Sodium: 133 mmol/L — ABNORMAL LOW (ref 135–145)
Total Bilirubin: 1 mg/dL (ref 0.3–1.2)
Total Protein: 5.9 g/dL — ABNORMAL LOW (ref 6.5–8.1)

## 2020-12-16 LAB — GLUCOSE, CAPILLARY
Glucose-Capillary: 130 mg/dL — ABNORMAL HIGH (ref 70–99)
Glucose-Capillary: 129 mg/dL — ABNORMAL HIGH (ref 70–99)

## 2020-12-16 LAB — MAGNESIUM: Magnesium: 1.8 mg/dL (ref 1.7–2.4)

## 2020-12-16 LAB — BLOOD GAS, ARTERIAL
Acid-base deficit: 2.2 mmol/L — ABNORMAL HIGH (ref 0.0–2.0)
Bicarbonate: 21.6 mmol/L (ref 20.0–28.0)
Drawn by: 42783
FIO2: 32
O2 Saturation: 95.4 %
Patient temperature: 38
pCO2 arterial: 35.9 mmHg (ref 32.0–48.0)
pH, Arterial: 7.403 (ref 7.350–7.450)
pO2, Arterial: 84.3 mmHg (ref 83.0–108.0)

## 2020-12-16 LAB — HEMOGLOBIN A1C
Hgb A1c MFr Bld: 5.8 % — ABNORMAL HIGH (ref 4.8–5.6)
Mean Plasma Glucose: 119.76 mg/dL

## 2020-12-16 LAB — CBG MONITORING, ED
Glucose-Capillary: 112 mg/dL — ABNORMAL HIGH (ref 70–99)
Glucose-Capillary: 115 mg/dL — ABNORMAL HIGH (ref 70–99)

## 2020-12-16 LAB — MRSA NEXT GEN BY PCR, NASAL: MRSA by PCR Next Gen: NOT DETECTED

## 2020-12-16 LAB — CORTISOL-AM, BLOOD: Cortisol - AM: 29.5 ug/dL — ABNORMAL HIGH (ref 6.7–22.6)

## 2020-12-16 LAB — PROTIME-INR
INR: 1.3 — ABNORMAL HIGH (ref 0.8–1.2)
Prothrombin Time: 15.8 seconds — ABNORMAL HIGH (ref 11.4–15.2)

## 2020-12-16 LAB — PROCALCITONIN: Procalcitonin: 0.85 ng/mL

## 2020-12-16 LAB — AMMONIA: Ammonia: 29 umol/L (ref 9–35)

## 2020-12-16 LAB — BRAIN NATRIURETIC PEPTIDE: B Natriuretic Peptide: 247.5 pg/mL — ABNORMAL HIGH (ref 0.0–100.0)

## 2020-12-16 MED ORDER — BUDESONIDE 0.5 MG/2ML IN SUSP
0.5000 mg | Freq: Two times a day (BID) | RESPIRATORY_TRACT | Status: DC
  Administered 2020-12-16 – 2020-12-27 (×22): 0.5 mg via RESPIRATORY_TRACT
  Filled 2020-12-16 (×22): qty 2

## 2020-12-16 MED ORDER — CHLORHEXIDINE GLUCONATE CLOTH 2 % EX PADS
6.0000 | MEDICATED_PAD | Freq: Every day | CUTANEOUS | Status: DC
  Administered 2020-12-16 – 2020-12-26 (×12): 6 via TOPICAL

## 2020-12-16 MED ORDER — CEFAZOLIN SODIUM-DEXTROSE 2-4 GM/100ML-% IV SOLN: 2.0000 g | Freq: Once | INTRAVENOUS | Status: DC

## 2020-12-16 MED ORDER — METHYLPREDNISOLONE SODIUM SUCC 40 MG IJ SOLR
40.0000 mg | Freq: Two times a day (BID) | INTRAMUSCULAR | Status: DC
  Administered 2020-12-16: 40 mg via INTRAVENOUS
  Filled 2020-12-16: qty 1

## 2020-12-16 MED ORDER — FAMOTIDINE IN NACL 20-0.9 MG/50ML-% IV SOLN
20.0000 mg | INTRAVENOUS | Status: DC
  Administered 2020-12-16: 20 mg via INTRAVENOUS
  Filled 2020-12-16: qty 50

## 2020-12-16 MED ORDER — ORAL CARE MOUTH RINSE: 15.0000 mL | Freq: Once | OROMUCOSAL | Status: DC

## 2020-12-16 MED ORDER — SODIUM CHLORIDE 0.9 % IV SOLN: INTRAVENOUS | Status: DC | PRN

## 2020-12-16 MED ORDER — INSULIN ASPART 100 UNIT/ML IJ SOLN
0.0000 [IU] | INTRAMUSCULAR | Status: DC
  Administered 2020-12-16 – 2020-12-21 (×8): 1 [IU] via SUBCUTANEOUS
  Administered 2020-12-22: 2 [IU] via SUBCUTANEOUS
  Administered 2020-12-22 (×4): 1 [IU] via SUBCUTANEOUS
  Administered 2020-12-22: 2 [IU] via SUBCUTANEOUS
  Administered 2020-12-23 – 2020-12-24 (×3): 1 [IU] via SUBCUTANEOUS
  Administered 2020-12-24: 0 [IU] via SUBCUTANEOUS
  Administered 2020-12-24 – 2020-12-26 (×10): 1 [IU] via SUBCUTANEOUS
  Administered 2020-12-26: 2 [IU] via SUBCUTANEOUS
  Administered 2020-12-26 – 2020-12-27 (×4): 1 [IU] via SUBCUTANEOUS

## 2020-12-16 MED ORDER — LACTATED RINGERS IV SOLN: INTRAVENOUS | Status: DC

## 2020-12-16 MED ORDER — CHLORHEXIDINE GLUCONATE 0.12 % MT SOLN
15.0000 mL | Freq: Two times a day (BID) | OROMUCOSAL | Status: DC
  Administered 2020-12-16 – 2020-12-18 (×4): 15 mL via OROMUCOSAL
  Filled 2020-12-16 (×3): qty 15

## 2020-12-16 MED ORDER — CHLORHEXIDINE GLUCONATE 0.12 % MT SOLN: 15.0000 mL | Freq: Once | OROMUCOSAL | Status: DC

## 2020-12-16 MED ORDER — IPRATROPIUM-ALBUTEROL 0.5-2.5 (3) MG/3ML IN SOLN
3.0000 mL | Freq: Once | RESPIRATORY_TRACT | Status: AC
  Administered 2020-12-16: 3 mL via RESPIRATORY_TRACT
  Filled 2020-12-16: qty 3

## 2020-12-16 MED ORDER — ORAL CARE MOUTH RINSE
15.0000 mL | Freq: Two times a day (BID) | OROMUCOSAL | Status: DC
  Administered 2020-12-17 (×2): 15 mL via OROMUCOSAL

## 2020-12-16 MED ORDER — FUROSEMIDE 10 MG/ML IJ SOLN
40.0000 mg | Freq: Once | INTRAMUSCULAR | Status: AC
  Administered 2020-12-16: 40 mg via INTRAVENOUS
  Filled 2020-12-16: qty 4

## 2020-12-16 NOTE — Patient Instructions (Signed)
DUE TO COVID-19 ONLY ONE VISITOR IS ALLOWED TO COME WITH YOU AND STAY IN THE WAITING ROOM ONLY DURING PRE OP AND PROCEDURE.   **NO VISITORS ARE ALLOWED IN THE SHORT STAY AREA OR RECOVERY ROOM!!**  IF YOU WILL BE ADMITTED INTO THE HOSPITAL YOU ARE ALLOWED ONLY TWO SUPPORT PEOPLE DURING VISITATION HOURS ONLY (10AM -8PM)   The support person(s) may change daily. The support person(s) must pass our screening, gel in and out, and wear a mask at all times, including in the patient's room. Patients must also wear a mask when staff or their support person are in the room.  No visitors under the age of 66. Any visitor under the age of 26 must be accompanied by an adult.    COVID SWAB TESTING MUST BE COMPLETED ON:  01/06/21 **MUST PRESENT COMPLETED FORM AT TESTING SITE**    [address] [place] (backside of the building) You are not required to quarantine, however you are required to wear a well-fitted mask when you are out and around people not in your household.  Hand Hygiene often Do NOT share personal items Notify your provider if you are in close contact with someone who has COVID or you develop fever 100.4 or greater, new onset of sneezing, cough, sore throat, shortness of breath or body aches.  [facility] Entr[facility] [address], [address], must go inside of the hospital, NOT A DRIVE THRU!  (Must self quarantine after testing. Follow instructions on handout.)       Your procedure is scheduled on: 01/08/21   Report to [facility] Main  Entrance   Report to Short Stay at 5:15 AM   [facility])   Call this number if you have problems the morning of surgery [phone]   Do not eat food :After Midnight.   May have liquids until: 4:30 AM    day of surgery  CLEAR LIQUID DIET  Foods Allowed                                                                     Foods Excluded  Water, Black Coffee and tea, regular and decaf                              liquids that you cannot  Plain Jell-O in any flavor  (No red)                                           see through such as: Fruit ices (not with fruit pulp)                                     milk, soups, orange juice              Iced Popsicles (No red)  All solid food                                   Apple juices Sports drinks like Gatorade (No red) Lightly seasoned clear broth or consume(fat free) Sugar, honey syrup  Sample Menu Breakfast                                Lunch                                     Supper Cranberry juice                    Beef broth                            Chicken broth Jell-O                                     Grape juice                           Apple juice Coffee or tea                        Jell-O                                      Popsicle                                                Coffee or tea                        Coffee or tea      Oral Hygiene is also important to reduce your risk of infection.                                    Remember - BRUSH YOUR TEETH THE MORNING OF SURGERY WITH YOUR REGULAR TOOTHPASTE   Do NOT smoke after Midnight   Take these medicines the morning of surgery with A SIP OF WATER: escitalopram,famotidine,omeprazole.Use inhalers as usual.                              You may not have any metal on your body including hair pins, jewelry, and body piercing             Do not wear lotions, powders, perfumes/cologne, or deodorant              Men may shave face and neck.   Do not bring valuables to the hospital. [facility] IS NOT             RESPONSIBLE   FOR VALUABLES.   Contacts, dentures or bridgework may not be  worn into surgery.   Bring small overnight bag day of surgery.    Patients discharged the day of surgery will not be allowed to drive home.   Special Instructions: Bring a copy of your healthcare power of attorney and living will  documents         the day of surgery if you haven't scanned them in before.              Please read over the following fact sheets you were given: IF YOU HAVE QUESTIONS ABOUT YOUR PRE OP INSTRUCTIONS PLEASE CALL [phone]   [place] - Preparing for Surgery Before surgery, you can play an important role.  Because skin is not sterile, your skin needs to be as free of germs as possible.  You can reduce the number of germs on your skin by washing with CHG (chlorahexidine gluconate) soap before surgery.  CHG is an antiseptic cleaner which kills germs and bonds with the skin to continue killing germs even after washing. Please DO NOT use if you have an allergy to CHG or antibacterial soaps.  If your skin becomes reddened/irritated stop using the CHG and inform your nurse when you arrive at Short Stay. Do not shave (including legs and underarms) for at least 48 hours prior to the first CHG shower.  You may shave your face/neck. Please follow these instructions carefully:  1.  Shower with CHG Soap the night before surgery and the  morning of Surgery.  2.  If you choose to wash your hair, wash your hair first as usual with your  normal  shampoo.  3.  After you shampoo, rinse your hair and body thoroughly to remove the  shampoo.                           4.  Use CHG as you would any other liquid soap.  You can apply chg directly  to the skin and wash                       Gently with a scrungie or clean washcloth.  5.  Apply the CHG Soap to your body ONLY FROM THE NECK DOWN.   Do not use on face/ open                           Wound or open sores. Avoid contact with eyes, ears mouth and genitals (private parts).                       Wash face,  Genitals (private parts) with your normal soap.             6.  Wash thoroughly, paying special attention to the area where your surgery  will be performed.  7.  Thoroughly rinse your body with warm water from the neck down.  8.  DO NOT shower/wash with your  normal soap after using and rinsing off  the CHG Soap.                9.  Pat yourself dry with a clean towel.            10.  Wear clean pajamas.            11.  Place clean sheets on your bed the night of your first shower and do not  sleep with  pets. Day of Surgery : Do not apply any lotions/deodorants the morning of surgery.  Please wear clean clothes to the hospital/surgery center.  FAILURE TO FOLLOW THESE INSTRUCTIONS MAY RESULT IN THE CANCELLATION OF YOUR SURGERY PATIENT SIGNATURE_________________________________  NURSE SIGNATURE__________________________________  ________________________________________________________________________

## 2020-12-16 NOTE — Progress Notes (Signed)
EEG complete - results pending 

## 2020-12-16 NOTE — ED Notes (Signed)
Pt in radiology since 1505. PT will go upstairs upon return. Family still at bedside.

## 2020-12-16 NOTE — Consult Note (Addendum)
NAME:  Nathan Mitchell, MRN:  [id number], DOB:  1931-03-09, LOS: 0 ADMISSION DATE:  2021-01-01, CONSULTATION DATE:  12/16/2020 REFERRING MD:  Dr. Roda Shutters, CHIEF COMPLAINT:  Encephalopathy   History of Present Illness:  87 year old male presents to ED 8/16 with altered mental status, decreased oral intake, and generalized fatigue. On arrival with tachycardia, respiratory distress with expiratory wheezes. Family reports over last 24 hours he has become progressively confused and combative. CT head with stable left SDH. CXR with cardiomegaly with chronic interstitial thickening. Sodium 129. Crt 1.79. COVID negative. WBC 14.7. Started on Vancomycin/Cefepime. Throughout the night with continued confusion. Given Zyprexa 2.5 mg x 1. This AM patient with progressive lethargy. Critical Care consulted for evaluation.   Of note patient was admitted 6/9 s/p fall found to have fractured femur and large left SDH, underwent IM nail for femur fracture, discharged with foley catheter. Family reports patient was seen in ED on 8/12 and diagnosed with UTI and AKI. Discharged with Keflex.   Pertinent  Medical History  Aortic Regurgitation, giant cell arteritis, optic neuropathy, COPD, A.Fib, HLD, GERD, Depression, TBI, thoracic aortic aneurysm  Significant Hospital Events: Including procedures, antibiotic start and stop dates in addition to other pertinent events   8/16 Admitted.   Interim History / Subjective:  As above.   Objective   Blood pressure (!) 135/92, pulse (!) 111, temperature 100.3 F (37.9 C), temperature source Rectal, resp. rate (!) 30, height 6\' 1"  (1.854 m), weight 80.3 kg, SpO2 100 %.        Intake/Output Summary (Last 24 hours) at 12/16/2020 1432 Last data filed at 12/16/2020 0350 Gross per 24 hour  Intake 550 ml  Output 800 ml  Net -250 ml   Filed Weights   01/01/21 1346  Weight: 80.3 kg    Examination: General: elderly male, mild distress noted  HENT: Dry MM  Lungs: Exp Wheeze,  mild tachypnea/accessory muscle use Cardiovascular: Tachy, HR 103 Abdomen: soft, non-distended, active bowel sounds  Extremities: -edema  Neuro: lethargic, +cough, does not follow commands, with stimulation patient moves all extremities  GU: foley in place > urine cloudy with sediment   Resolved Hospital Problem list     Assessment & Plan:   Encephalopathy, high likelihood in setting of UTI H/O Large Left SDH H/O Depression  Plan -Repeat Head CT -Obtain EEG -Avoid sedatives -Hold home Lexapro -Send Ammonia  -remaining treatment below  Leucocytosis, Febrile  Urosepsis (as evidence by tachycardia, elevated RR, AKI)  Plan -Trend WBC and Fever Curve -Follow Culture Data -Continue Cefepime/Vancomycin  -12/17/20 renal pending   AKI, Crt 1.74 on admission, now 1.63, baseline 1, thought secondary to dehydration, UTI, with poor oral intake Plan -Trend BMP -Strict I&O -Replace electrolytes as indicated   Acute Hypoxic Respiratory Distress, CXR with mild bibasilar airspace diease, +AECOPD  -COVID negative  Plan -Repeat CXR pending -Scheduled and PRN Nebs -Continue scheduled solu-medrol  -Titrate supplemental oxygen for saturation goal >88   A.Fib, not on anticoagulation  HLD Plan -Cardiac Monitoring  -not on home statin   GERD Plan -Pepcid   Best Practice (right click and "Reselect all SmartList Selections" daily)   Diet/type: NPO DVT prophylaxis: SCD GI prophylaxis: H2B Lines: N/A Foley:  Yes, and it is still needed Code Status:  full code Last date of multidisciplinary goals of care discussion [pending]  Labs   CBC: Recent Labs  Lab 12/11/20 1433 01-01-2021 1356 01/01/2021 1425 12/16/20 0231  WBC 12.4*  --  16.9*  14.7*  NEUTROABS  --   --  15.0*  --   HGB 9.9* 9.2* 8.9* 8.2*  HCT 31.5* 27.0* 28.0* 27.1*  MCV 88.2  --  87.0 90.0  PLT 316  --  264 236    Basic Metabolic Panel: Recent Labs  Lab 12/11/20 1433 10-Jan-2021 1356 01-10-2021 1425 12/16/20 0231   NA 133* 130* 129* 133*  K 4.5 4.8 4.9 4.2  CL 101  --  97* 102  CO2 23  --  23 21*  GLUCOSE 130*  --  112* 106*  BUN 25*  --  26* 27*  CREATININE 1.74*  --  1.79* 1.63*  CALCIUM 8.6*  --  8.2* 8.2*  MG  --   --   --  1.8   GFR: Estimated Creatinine Clearance: 35.4 mL/min (A) (by C-G formula based on SCr of 1.63 mg/dL (H)). Recent Labs  Lab 12/11/20 1433 10-Jan-2021 1425 01/10/2021 1434 2021/01/10 1630 12/16/20 0231  PROCALCITON  --   --   --   --  0.85  WBC 12.4* 16.9*  --   --  14.7*  LATICACIDVEN  --   --  1.1 1.5  --     Liver Function Tests: Recent Labs  Lab 01/10/2021 1425 12/16/20 0231  AST 13* 14*  ALT 13 13  ALKPHOS 61 53  BILITOT 1.0 1.0  PROT 6.1* 5.9*  ALBUMIN 2.5* 2.5*   No results for input(s): LIPASE, AMYLASE in the last 168 hours. No results for input(s): AMMONIA in the last 168 hours.  ABG    Component Value Date/Time   PHART 7.403 12/16/2020 1312   PCO2ART 35.9 12/16/2020 1312   PO2ART 84.3 12/16/2020 1312   HCO3 21.6 12/16/2020 1312   TCO2 25 Jan 10, 2021 1356   ACIDBASEDEF 2.2 (H) 12/16/2020 1312   O2SAT 95.4 12/16/2020 1312     Coagulation Profile: Recent Labs  Lab 01-10-2021 1425 12/16/20 0231  INR 1.3* 1.3*    Cardiac Enzymes: No results for input(s): CKTOTAL, CKMB, CKMBINDEX, TROPONINI in the last 168 hours.  HbA1C: No results found for: HGBA1C  CBG: Recent Labs  Lab 12/16/20 1305  GLUCAP 112*    Review of Systems:   Unable to review given encephalopath   Past Medical History:  He,  has a past medical history of Aortic atherosclerosis (HCC), Atrial fibrillation (HCC), Giant cell arteritis (HCC), Osteopenia, Pure hypercholesterolemia, Thoracic aortic aneurysm (HCC), and Thrombophilia (HCC).   Surgical History:   Past Surgical History:  Procedure Laterality Date   APPENDECTOMY     CRANIOTOMY Left 10/08/2020   Procedure: LEFT BURR HOLE DRAINAGE OF SUBDURAL HEMATOMA;  Surgeon: Jadene Pierini, MD;  Location: [facility] OR;   Service: Neurosurgery;  Laterality: Left;   HERNIA REPAIR     INTRAMEDULLARY (IM) NAIL INTERTROCHANTERIC Right 10/08/2020   Procedure: RIGHT INTRAMEDULLARY (IM) NAIL INTERTROCHANTRIC WITH PREVENA WOUND VAC APPLICATION;  Surgeon: Tarry Kos, MD;  Location: [facility] OR;  Service: Orthopedics;  Laterality: Right;     Social History:   reports that he has quit smoking. He has never used smokeless tobacco. He reports current alcohol use of about 3.0 standard drinks per week. He reports that he does not use drugs.   Family History:  His family history includes Cancer in his brother; Parkinson's disease in his father; Stroke in his brother.   Allergies Allergies  Allergen Reactions   Nsaids     Due to afib   Tiotropium Bromide Monohydrate     Other reaction(s):  ineffecticve   Umeclidinium-Vilanterol     Other reaction(s): ineffective     Home Medications  Prior to Admission medications   Medication Sig Start Date End Date Taking? Authorizing Provider  acetaminophen (TYLENOL) 325 MG tablet Take 2 tablets (650 mg total) by mouth 3 (three) times daily. Patient taking differently: Take 650 mg by mouth every 6 (six) hours as needed for moderate pain. 11/04/20   Love, Evlyn KannerPamela S, PA-C  albuterol (VENTOLIN HFA) 108 (90 Base) MCG/ACT inhaler Inhale 1-2 puffs into the lungs every 6 (six) hours as needed for wheezing or shortness of breath.    [provider]  CRANBERRY EXTRACT PO Take 1 capsule by mouth at bedtime.    [provider]  diclofenac Sodium (VOLTAREN) 1 % GEL Apply 2 g topically daily as needed (hip pain).    [provider]  docusate sodium (COLACE) 100 MG capsule Take 1 capsule (100 mg total) by mouth 2 (two) times daily. Need to purchase over the counter Patient taking differently: Take 100 mg by mouth daily as needed for mild constipation. 11/04/20   Love, Evlyn KannerPamela S, PA-C  escitalopram (LEXAPRO) 20 MG tablet Take 20 mg by mouth daily.    [provider]   famotidine (PEPCID) 40 MG tablet Take 40 mg by mouth at bedtime. 01/30/19   [provider]  ferrous gluconate (FERGON) 240 (27 FE) MG tablet Take 240 mg by mouth every Monday, Wednesday, and Friday.    [provider]  folic acid (FOLVITE) 1 MG tablet Take 1 mg by mouth daily. 12/02/14   [provider]  melatonin 3 MG TABS tablet Take 1 tablet (3 mg total) by mouth at bedtime. Purchase over the counter Patient taking differently: Take 3 mg by mouth at bedtime as needed (sleep). 11/04/20   Love, Evlyn KannerPamela S, PA-C  methocarbamol (ROBAXIN) 500 MG tablet TAKE ONE TABLET DAILY FOR MUSCLE SPASM Patient not taking: No sig reported 11/26/20   Cristie HemStanbery, Mary L, PA-C  methotrexate (RHEUMATREX) 2.5 MG tablet Take 20 mg by mouth every Friday. 04/24/15   [provider]  Omeprazole 20 MG TBEC Take 20 mg by mouth daily.     [provider]  polyethylene glycol (MIRALAX / GLYCOLAX) 17 g packet Take 17 g by mouth 2 (two) times daily. Need to purchase over the counter Patient taking differently: Take 17 g by mouth daily as needed for moderate constipation. Need to purchase over the counter 11/04/20   Love, Evlyn Kanneramela S, PA-C  tamsulosin (FLOMAX) 0.4 MG CAPS capsule Take 0.8 mg by mouth daily.    [provider]  vitamin B-12 (CYANOCOBALAMIN) 1000 MCG tablet Take 1,000 mcg by mouth daily.    [provider]     Critical care time: 42 minutes    CRITICAL CARE Performed by: Tobey GrimKatalina M Merary Garguilo   Total critical care time: 42 minutes  Critical care time was exclusive of separately billable procedures and treating other patients.  Critical care was necessary to treat or prevent imminent or life-threatening deterioration.  Critical care was time spent personally by me on the following activities: development of treatment plan with patient and/or surrogate as well as nursing, discussions with consultants, evaluation of patient's response to treatment, examination  of patient, obtaining history from patient or surrogate, ordering and performing treatments and interventions, ordering and review of laboratory studies, ordering and review of radiographic studies, pulse oximetry and re-evaluation of patient's condition.  Jovita KussmaulKatalina Aili Casillas, AGACNP-BC [facility]  PCCM Pgr: [phone]

## 2020-12-16 NOTE — ED Notes (Signed)
Went into room to check on pt, pt noticed to have increased respiratory problems and decreased mental status. Dr. Roda Shutters made aware. VSS at this time

## 2020-12-16 NOTE — Progress Notes (Signed)
Patient going to CT. Then transferred to 67m bed. Will get EEG when patient is available.

## 2020-12-16 NOTE — Progress Notes (Addendum)
PROGRESS NOTE    Nathan Mitchell  ZCH:[id number] DOB: March 30, 1931 DOA: 01/03/2021 PCP: Merlene Laughter, MD    Chief Complaint  Patient presents with   Altered Mental Status    Brief Narrative:  giant cell arteritis, optic neuropathy with visual impairment,  COPD, thoracic aortic aneurysm, atrial fibrillation off anticoagulation since 09/2020 due to SDH secondary to fall after striping over a indwelling foley at home with resulting TBI,  who presents with worsening mental status. He was treated with abx for uti recently  Respiratory panel for flu and COVID-negative.  Urine culture and blood cultures are pending Chest x-ray showed cardiomegaly and chronic interstitial thickening likely due to chronic bronchitis.  CT head showed stable left subdural hematoma with possible decrease in midline shift.  No acute abnormalities.  Patient started on vancomycin, cefepime, Flagyl in the ED and received 1 L fluid bolus and started on a rate of IV fluids.  Also received multiple nebs with improvement in his wheezing.  Wife also reports some ongoing constipation.  Subjective:   He was confused around 3am, then reported with improvement around breakfast time, RN reports he went in to sleep this am, then noticed he became tachypneic, and not able to wake up  Wife at bedside  Assessment & Plan:   Principal Problem:   Sepsis (HCC) Active Problems:   Atrial fibrillation (HCC)   Chronic obstructive pulmonary disease, unspecified (HCC)   Dyslipidemia   Gastroesophageal reflux disease   Major depression in remission (HCC)   TBI (traumatic brain injury) (HCC)   Acute lower UTI    Sepsis present on admission/acute metabolic encephalopathy -Patient presented with tachycardia, tachypnea, fever, elevated white count of 16.9 -CT head showed stable left subdural hematoma with possible decrease in midline shift.  No acute abnormalities -Possibly from complicated UTI with the presence of indwelling  Foley catheter -started on cefepime, Flagyl, vancomycin in the ED -Blood culture and urine culture pending  COPD , not on home O2 -Per initial H&P patient had wheezing in the ED which resolved after nebulizer treatment -with acute mental status changes on 8/17 am, with wheezing, tachypnea, he is on 3liter oxygen -stat abg ordered, start solumedrol, likely will need bipap , will discuss with critical care  AKI on CKDII Cr 0.9 at baseline, cr 1.79on presentation Renal dosing meds  A. Fib Was taken off anticoagulation in June 2020 after subdural hematoma Not on nodal blocking agent at home either   GERD - Continue home PPI  Depression - Holding home Lexapro in the setting of altered no status  Bph with indwelling foley, was last changed on 7/14 Change foley order placed     The patient's BMI is: Body mass index is 23.36 kg/m.Marland Kitchen     Unresulted Labs (From admission, onward)     Start     Ordered   12/22/20 0500  Creatinine, serum  (enoxaparin (LOVENOX)    CrCl >/= 30 ml/min)  Weekly,   R     Comments: while on enoxaparin therapy    01-03-21 1903   12/16/20 0756  MRSA Next Gen by PCR, Nasal  Once,   STAT        12/16/20 0755   01/03/21 1407  Miscellaneous Genetic Test (non-interfaced send-out)  Once,   STAT       Comments: [facility]   01-03-21 1406   01-03-21 1402  Blood Culture (routine x 2)  (Septic presentation on arrival (screening labs, nursing and treatment orders for obvious sepsis))  BLOOD CULTURE X 2,   STAT      09-Jan-2021 1402   Jan 09, 2021 1402  Urine Culture  (Septic presentation on arrival (screening labs, nursing and treatment orders for obvious sepsis))  ONCE - STAT,   STAT       Question:  Indication  Answer:  Sepsis   Jan 09, 2021 1402              DVT prophylaxis: enoxaparin (LOVENOX) injection 40 mg Start: January 09, 2021 2200   Code Status:full Family Communication: wife at bedside , tried to call son Dr Roxan Hockey, not able to reach him, not able to leave  message Disposition:    Dispo: The patient is from: home              Anticipated d/c is to: TBD              Anticipated d/c date is: TBD                Consultants:  Critical care  Procedures:  none  Antimicrobials:   Anti-infectives (From admission, onward)    Start     Dose/Rate Route Frequency Ordered Stop   12/17/20 1500  vancomycin (VANCOREADY) IVPB 1750 mg/350 mL        1,750 mg 175 mL/hr over 120 Minutes Intravenous Every 48 hours 2021-01-09 1539     12/16/20 0200  ceFEPIme (MAXIPIME) 2 g in sodium chloride 0.9 % 100 mL IVPB        2 g 200 mL/hr over 30 Minutes Intravenous Every 12 hours January 09, 2021 1539     01/09/21 2200  metroNIDAZOLE (FLAGYL) IVPB 500 mg        500 mg 100 mL/hr over 60 Minutes Intravenous Every 8 hours Jan 09, 2021 1953     01/09/2021 1415  ceFEPIme (MAXIPIME) 2 g in sodium chloride 0.9 % 100 mL IVPB        2 g 200 mL/hr over 30 Minutes Intravenous  Once 01/09/21 1402 2021/01/09 1528   09-Jan-2021 1415  metroNIDAZOLE (FLAGYL) IVPB 500 mg        500 mg 100 mL/hr over 60 Minutes Intravenous  Once 09-Jan-2021 1402 09-Jan-2021 1647   09-Jan-2021 1415  vancomycin (VANCOREADY) IVPB 1500 mg/300 mL        1,500 mg 150 mL/hr over 120 Minutes Intravenous  Once 09-Jan-2021 1402 01/09/2021 1900           Objective: Vitals:   12/16/20 0645 12/16/20 0700 12/16/20 0715 12/16/20 0745  BP: 121/66 117/73 96/84 115/68  Pulse: (!) 120 (!) 129 (!) 117 (!) 118  Resp: (!) 27 (!) 23 (!) 22 (!) 21  Temp:      TempSrc:      SpO2: 100% 100% 100% 100%  Weight:      Height:        Intake/Output Summary (Last 24 hours) at 12/16/2020 0759 Last data filed at 12/16/2020 0350 Gross per 24 hour  Intake 550 ml  Output 800 ml  Net -250 ml   Filed Weights   01-09-2021 1346  Weight: 80.3 kg    Examination:  General exam: not awake, + indwelling foley Respiratory system: diffuse bilateral wheezing, tachypnea Cardiovascular system: S1 & S2 heard, IRRR. No pedal edema. Gastrointestinal  system: Abdomen is nondistended, soft and nontender.  Normal bowel sounds heard. Central nervous system: not awake , not following commands Extremities: not awake, not following commands Skin: No rashes, lesions or ulcers Psychiatry: not awake, not following commands    Data  Reviewed: I have personally reviewed following labs and imaging studies  CBC: Recent Labs  Lab 12/11/20 1433 12/18/2020 1356 12/04/2020 1425 12/16/20 0231  WBC 12.4*  --  16.9* 14.7*  NEUTROABS  --   --  15.0*  --   HGB 9.9* 9.2* 8.9* 8.2*  HCT 31.5* 27.0* 28.0* 27.1*  MCV 88.2  --  87.0 90.0  PLT 316  --  264 236    Basic Metabolic Panel: Recent Labs  Lab 12/11/20 1433 12/17/2020 1356 12/05/2020 1425 12/16/20 0231  NA 133* 130* 129* 133*  K 4.5 4.8 4.9 4.2  CL 101  --  97* 102  CO2 23  --  23 21*  GLUCOSE 130*  --  112* 106*  BUN 25*  --  26* 27*  CREATININE 1.74*  --  1.79* 1.63*  CALCIUM 8.6*  --  8.2* 8.2*  MG  --   --   --  1.8    GFR: Estimated Creatinine Clearance: 35.4 mL/min (A) (by C-G formula based on SCr of 1.63 mg/dL (H)).  Liver Function Tests: Recent Labs  Lab 12/22/2020 1425 12/16/20 0231  AST 13* 14*  ALT 13 13  ALKPHOS 61 53  BILITOT 1.0 1.0  PROT 6.1* 5.9*  ALBUMIN 2.5* 2.5*    CBG: No results for input(s): GLUCAP in the last 168 hours.   Recent Results (from the past 240 hour(s))  Resp Panel by RT-PCR (Flu A&B, Covid) Nasopharyngeal Swab     Status: None   Collection Time: 12/11/20  2:11 PM   Specimen: Nasopharyngeal Swab; Nasopharyngeal(NP) swabs in vial transport medium  Result Value Ref Range Status   SARS Coronavirus 2 by RT PCR NEGATIVE NEGATIVE Final    Comment: (NOTE) SARS-CoV-2 target nucleic acids are NOT DETECTED.  The SARS-CoV-2 RNA is generally detectable in upper respiratory specimens during the acute phase of infection. The lowest concentration of SARS-CoV-2 viral copies this assay can detect is 138 copies/mL. A negative result does not preclude  SARS-Cov-2 infection and should not be used as the sole basis for treatment or other patient management decisions. A negative result may occur with  improper specimen collection/handling, submission of specimen other than nasopharyngeal swab, presence of viral mutation(s) within the areas targeted by this assay, and inadequate number of viral copies(<138 copies/mL). A negative result must be combined with clinical observations, patient history, and epidemiological information. The expected result is Negative.  Fact Sheet for Patients:  BloggerCourse.comhttps://www.fda.gov/media/152166/download  Fact Sheet for Healthcare Providers:  SeriousBroker.ithttps://www.fda.gov/media/152162/download  This test is no t yet approved or cleared by the [place] FDA and  has been authorized for detection and/or diagnosis of SARS-CoV-2 by FDA under an Emergency Use Authorization (EUA). This EUA will remain  in effect (meaning this test can be used) for the duration of the COVID-19 declaration under Section 564(b)(1) of the Act, 21 U.S.C.section 360bbb-3(b)(1), unless the authorization is terminated  or revoked sooner.       Influenza A by PCR NEGATIVE NEGATIVE Final   Influenza B by PCR NEGATIVE NEGATIVE Final    Comment: (NOTE) The Xpert Xpress SARS-CoV-2/FLU/RSV plus assay is intended as an aid in the diagnosis of influenza from Nasopharyngeal swab specimens and should not be used as a sole basis for treatment. Nasal washings and aspirates are unacceptable for Xpert Xpress SARS-CoV-2/FLU/RSV testing.  Fact Sheet for Patients: BloggerCourse.comhttps://www.fda.gov/media/152166/download  Fact Sheet for Healthcare Providers: SeriousBroker.ithttps://www.fda.gov/media/152162/download  This test is not yet approved or cleared by the [place] FDA and has  been authorized for detection and/or diagnosis of SARS-CoV-2 by FDA under an Emergency Use Authorization (EUA). This EUA will remain in effect (meaning this test can be used) for the duration of  the COVID-19 declaration under Section 564(b)(1) of the Act, 21 U.S.C. section 360bbb-3(b)(1), unless the authorization is terminated or revoked.  Performed at [facility], [address] [place], [place] [address]   Resp Panel by RT-PCR (Flu A&B, Covid) Nasopharyngeal Swab     Status: None   Collection Time: 12/07/2020  2:02 PM   Specimen: Nasopharyngeal Swab; Nasopharyngeal(NP) swabs in vial transport medium  Result Value Ref Range Status   SARS Coronavirus 2 by RT PCR NEGATIVE NEGATIVE Final    Comment: (NOTE) SARS-CoV-2 target nucleic acids are NOT DETECTED.  The SARS-CoV-2 RNA is generally detectable in upper respiratory specimens during the acute phase of infection. The lowest concentration of SARS-CoV-2 viral copies this assay can detect is 138 copies/mL. A negative result does not preclude SARS-Cov-2 infection and should not be used as the sole basis for treatment or other patient management decisions. A negative result may occur with  improper specimen collection/handling, submission of specimen other than nasopharyngeal swab, presence of viral mutation(s) within the areas targeted by this assay, and inadequate number of viral copies(<138 copies/mL). A negative result must be combined with clinical observations, patient history, and epidemiological information. The expected result is Negative.  Fact Sheet for Patients:  BloggerCourse.com  Fact Sheet for Healthcare Providers:  SeriousBroker.it  This test is no t yet approved or cleared by the [place] FDA and  has been authorized for detection and/or diagnosis of SARS-CoV-2 by FDA under an Emergency Use Authorization (EUA). This EUA will remain  in effect (meaning this test can be used) for the duration of the COVID-19 declaration under Section 564(b)(1) of the Act, 21 U.S.C.section 360bbb-3(b)(1), unless the authorization is terminated  or  revoked sooner.       Influenza A by PCR NEGATIVE NEGATIVE Final   Influenza B by PCR NEGATIVE NEGATIVE Final    Comment: (NOTE) The Xpert Xpress SARS-CoV-2/FLU/RSV plus assay is intended as an aid in the diagnosis of influenza from Nasopharyngeal swab specimens and should not be used as a sole basis for treatment. Nasal washings and aspirates are unacceptable for Xpert Xpress SARS-CoV-2/FLU/RSV testing.  Fact Sheet for Patients: BloggerCourse.com  Fact Sheet for Healthcare Providers: SeriousBroker.it  This test is not yet approved or cleared by the [place] FDA and has been authorized for detection and/or diagnosis of SARS-CoV-2 by FDA under an Emergency Use Authorization (EUA). This EUA will remain in effect (meaning this test can be used) for the duration of the COVID-19 declaration under Section 564(b)(1) of the Act, 21 U.S.C. section 360bbb-3(b)(1), unless the authorization is terminated or revoked.  Performed at [facility], [address], [place], [place] [address]          Radiology Studies: CT HEAD WO CONTRAST ( )  Result Date: 12/05/2020 CLINICAL DATA:  Altered mental status. EXAM: CT HEAD WITHOUT CONTRAST TECHNIQUE: Contiguous axial images were obtained from the base of the skull through the vertex without intravenous contrast. COMPARISON:  CT 12 days ago 12/03/2020 FINDINGS: Brain: Left holo hemispheric subdural hematoma has re-distributed from prior exam, currently more prominent overlying the temporal occipital lobes rather than the frontal parietal lobes. Maximal thickness is similar at 14 mm, previously 13 mm. This is predominantly low-density, some intermediate density primarily in the pendant portion was also seen on  prior exam and appears similar. The degree of midline shift has improved, currently 2-3 mm, previously 4 mm. There is no new hemorrhage. Generalized atrophy and chronic small vessel  ischemia similar. 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 No acute ischemia. The basilar cisterns remain patent. Vascular: Atherosclerosis of skullbase vasculature without hyperdense vessel or abnormal calcification. Skull: Again seen left frontal burr hole. No skull fracture or acute findings. Sinuses/Orbits: No acute findings. Bilateral cataract resection. Small mucous retention cyst and mucosal thickening in the right frontal sinus. Mastoid air cells are clear. Other: None. IMPRESSION: 1. Left-sided subdural hematoma which is not significantly changed in size over the past 12 days, with slight decreased midline shift. No evidence of acute hemorrhage. 2. Unchanged atrophy and chronic small vessel ischemia. Remote lacunar infarcts in the left thalamus. Electronically Signed   By: Narda Rutherford M.D.   On: 01-12-2021 17:01   DG Chest Port 1 View  Result Date: 2021-01-12 CLINICAL DATA:  Shortness of breath and altered mental status for 1 day EXAM: PORTABLE CHEST 1 VIEW COMPARISON:  12/11/2020 FINDINGS: Midline trachea. Mild cardiomegaly. Atherosclerosis in the transverse aorta. No pleural effusion or pneumothorax. Similar interstitial thickening. Mild bibasilar airspace disease. IMPRESSION: Cardiomegaly with chronic interstitial thickening, likely related to smoking or chronic bronchitis. Mild bibasilar Airspace disease, likely atelectasis. Aortic Atherosclerosis (ICD10-I70.0). Electronically Signed   By: Jeronimo Greaves M.D.   On: 01-12-2021 14:21        Scheduled Meds:  enoxaparin (LOVENOX) injection  40 mg Subcutaneous Q24H   ipratropium-albuterol  3 mL Nebulization Q6H WA   sodium chloride flush  3 mL Intravenous Q12H   tamsulosin  0.8 mg Oral Daily   Continuous Infusions:  ceFEPime (MAXIPIME) IV Stopped (12/16/20 0426)   lactated ringers 150 mL/hr at 12/16/20 0526   metronidazole Stopped (12/16/20 0631)   [START ON 12/17/2020] vancomycin       LOS: 0 days   Time spent:  Greater than 50% of this time was spent in counseling, explanation of diagnosis, planning of further management, and coordination of care.   Voice Recognition Reubin Milan dictation system was used to create this note, attempts have been made to correct errors. Please contact the author with questions and/or clarifications.   Albertine Grates, MD PhD FACP [facility]  Available via Epic secure chat 7am-7pm for nonurgent issues Please page for urgent issues To page the attending provider between 7A-7P or the covering provider during after hours 7P-7A, please log into the web site [email or web address] and access using universal [place] password for that web site. If you do not have the password, please call the hospital operator.    12/16/2020, 7:59 AM

## 2020-12-17 ENCOUNTER — Encounter (HOSPITAL_COMMUNITY)
Admission: RE | Admit: 2020-12-17 | Discharge: 2020-12-17 | Disposition: A | Discharge status: Home / Self Care | Payer: Medicare PPO | Source: Ambulatory Visit | Attending: Geriatric Medicine | Admitting: Geriatric Medicine

## 2020-12-17 ENCOUNTER — Inpatient Hospital Stay (HOSPITAL_COMMUNITY): Payer: Medicare PPO

## 2020-12-17 DIAGNOSIS — G9341 Metabolic encephalopathy: Secondary | ICD-10-CM | POA: Diagnosis not present

## 2020-12-17 DIAGNOSIS — R6520 Severe sepsis without septic shock: Secondary | ICD-10-CM | POA: Diagnosis not present

## 2020-12-17 DIAGNOSIS — A419 Sepsis, unspecified organism: Secondary | ICD-10-CM | POA: Diagnosis not present

## 2020-12-17 DIAGNOSIS — I4891 Unspecified atrial fibrillation: Secondary | ICD-10-CM | POA: Diagnosis not present

## 2020-12-17 DIAGNOSIS — N179 Acute kidney failure, unspecified: Secondary | ICD-10-CM

## 2020-12-17 DIAGNOSIS — I442 Atrioventricular block, complete: Secondary | ICD-10-CM | POA: Diagnosis not present

## 2020-12-17 LAB — ECHOCARDIOGRAM COMPLETE
AR max vel: 3.58 cm2
AV Area VTI: 2.58 cm2
AV Area mean vel: 3.45 cm2
AV Mean grad: 3 mmHg
AV Peak grad: 5.6 mmHg
Ao pk vel: 1.18 m/s
Height: 73 in
P 1/2 time: 382 msec
S' Lateral: 3.1 cm
Weight: 2694.9 oz

## 2020-12-17 LAB — BASIC METABOLIC PANEL
Anion gap: 10 (ref 5–15)
BUN: 33 mg/dL — ABNORMAL HIGH (ref 8–23)
CO2: 22 mmol/L (ref 22–32)
Calcium: 8.5 mg/dL — ABNORMAL LOW (ref 8.9–10.3)
Chloride: 105 mmol/L (ref 98–111)
Creatinine, Ser: 1.65 mg/dL — ABNORMAL HIGH (ref 0.61–1.24)
GFR, Estimated: 40 mL/min — ABNORMAL LOW (ref >60)
Glucose, Bld: 127 mg/dL — ABNORMAL HIGH (ref 70–99)
Potassium: 3.9 mmol/L (ref 3.5–5.1)
Sodium: 137 mmol/L (ref 135–145)

## 2020-12-17 LAB — CBC
HCT: 26.5 % — ABNORMAL LOW (ref 39.0–52.0)
Hemoglobin: 8.3 g/dL — ABNORMAL LOW (ref 13.0–17.0)
MCH: 27.5 pg (ref 26.0–34.0)
MCHC: 31.3 g/dL (ref 30.0–36.0)
MCV: 87.7 fL (ref 80.0–100.0)
Platelets: 230 10*3/uL (ref 150–400)
RBC: 3.02 MIL/uL — ABNORMAL LOW (ref 4.22–5.81)
RDW: 17.5 % — ABNORMAL HIGH (ref 11.5–15.5)
WBC: 13.4 10*3/uL — ABNORMAL HIGH (ref 4.0–10.5)
nRBC: 0 % (ref 0.0–0.2)

## 2020-12-17 LAB — MAGNESIUM: Magnesium: 2 mg/dL (ref 1.7–2.4)

## 2020-12-17 LAB — PHOSPHORUS: Phosphorus: 4.6 mg/dL (ref 2.5–4.6)

## 2020-12-17 LAB — GLUCOSE, CAPILLARY
Glucose-Capillary: 108 mg/dL — ABNORMAL HIGH (ref 70–99)
Glucose-Capillary: 109 mg/dL — ABNORMAL HIGH (ref 70–99)
Glucose-Capillary: 113 mg/dL — ABNORMAL HIGH (ref 70–99)
Glucose-Capillary: 116 mg/dL — ABNORMAL HIGH (ref 70–99)
Glucose-Capillary: 144 mg/dL — ABNORMAL HIGH (ref 70–99)
Glucose-Capillary: 97 mg/dL (ref 70–99)

## 2020-12-17 MED ORDER — SODIUM CHLORIDE 0.9 % IV SOLN
2.0000 g | Freq: Two times a day (BID) | INTRAVENOUS | Status: DC
  Administered 2020-12-17 – 2020-12-20 (×6): 2 g via INTRAVENOUS
  Filled 2020-12-17 (×6): qty 2

## 2020-12-17 MED ORDER — HALOPERIDOL LACTATE 5 MG/ML IJ SOLN
INTRAMUSCULAR | Status: AC
  Filled 2020-12-17: qty 1

## 2020-12-17 MED ORDER — FUROSEMIDE 10 MG/ML IJ SOLN
40.0000 mg | Freq: Once | INTRAMUSCULAR | Status: AC
  Administered 2020-12-17: 40 mg via INTRAVENOUS
  Filled 2020-12-17: qty 4

## 2020-12-17 MED ORDER — HALOPERIDOL LACTATE 5 MG/ML IJ SOLN
1.0000 mg | Freq: Four times a day (QID) | INTRAMUSCULAR | Status: DC | PRN
  Administered 2020-12-17 – 2020-12-18 (×2): 1 mg via INTRAVENOUS
  Filled 2020-12-17 (×2): qty 1

## 2020-12-17 MED ORDER — FAMOTIDINE 20 MG PO TABS
20.0000 mg | ORAL_TABLET | Freq: Every day | ORAL | Status: DC
  Administered 2020-12-18: 20 mg via ORAL
  Filled 2020-12-17: qty 1

## 2020-12-17 MED ORDER — POTASSIUM CHLORIDE CRYS ER 20 MEQ PO TBCR
40.0000 meq | EXTENDED_RELEASE_TABLET | Freq: Once | ORAL | Status: AC
  Administered 2020-12-17: 40 meq via ORAL
  Filled 2020-12-17: qty 2

## 2020-12-17 MED ORDER — SODIUM CHLORIDE 0.9 % IV SOLN: 2.0000 g | INTRAVENOUS | Status: DC

## 2020-12-17 MED ORDER — HALOPERIDOL LACTATE 5 MG/ML IJ SOLN: 1.0000 mg | Freq: Once | INTRAMUSCULAR | Status: DC

## 2020-12-17 NOTE — Progress Notes (Signed)
  Echocardiogram 2D Echocardiogram has been performed.  Roosvelt Maser F 12/17/2020, 2:27 PM

## 2020-12-17 NOTE — Progress Notes (Addendum)
Pharmacy Antibiotic Note  Nathan Mitchell is a 87 y.o. male admitted on 12/26/2020 with sepsis with suspected urinary source. 8/18 patient is afebrile, tachycardic (HR 100s), leukocytosis is improving, however WBC still elevated @ 13.4. Pt also has altered mental status which doesn't seem to be present at baseline, EEG showing moderate diffuse encephalopathy of unknown etiology, no seizures/epileptiform discharges noted. Pharmacy was consulted for ceftriaxone dosing.   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 Given new culture information, plan is to resume antibiotic therapy with cefepime. Most recent dose of cefepime was 8/18 AM, so patient has not missed any doses of appropriate therapy.   STOP Ceftriaxone  RESUME Cefepime 2g IV Q12H  F/U LOT, renal function   Height: 6\' 1"  (185.4 cm) Weight: 76.4 kg (168 lb 6.9 oz) IBW/kg (Calculated) : 79.9  Temp (24hrs), Avg:97.6 F (36.4 C), Min:96.8 F (36 C), Max:98.1 F (36.7 C)  Recent Labs  Lab 12/11/20 1433 12/17/2020 1425 12/16/2020 1434 12/09/2020 1630 12/16/20 0231 12/17/20 0153  WBC 12.4* 16.9*  --   --  14.7* 13.4*  CREATININE 1.74* 1.79*  --   --  1.63* 1.65*  LATICACIDVEN  --   --  1.1 1.5  --   --      Estimated Creatinine Clearance: 33.4 mL/min (A) (by C-G formula based on SCr of 1.65 mg/dL (H)).    Allergies  Allergen Reactions   Nsaids     Due to afib   Tiotropium Bromide Monohydrate     Other reaction(s): ineffecticve   Umeclidinium-Vilanterol     Other reaction(s): ineffective    Antimicrobials this admission: Vanc 8/16 >> 8/18 Cefepime 8/16 >>8/18  Flagyl 8/16>>8/18  Ceftriaxone 8/18>>  Dose adjustments this admission: N/A  Microbiology results: 8/16 BCx: ngtd  8/16 UCx: > 100,000 colonies GNRs (Pseudomonas aeroginosa)    9/16, PharmD PGY-1 Acute Care Resident  12/17/2020 11:21 AM

## 2020-12-17 NOTE — Progress Notes (Signed)
Pt had 8 beat run VT. Dr. Merrily Pew notified.

## 2020-12-17 NOTE — Progress Notes (Addendum)
eLink Physician-Brief Progress Note Patient Name: Nathan Mitchell DOB: 08/13/1930 MRN: [id number]   Date of Service  12/17/2020  HPI/Events of Note  Agitation - QTc interval = 0.535 seconds. Currently on Haldol 1 mg IV Q 6 hours PRN agitation. With prolonged QTc interval and desire of rounding team to limit sedating agents we are really limited in additional medication for behavior.   eICU Interventions  Plan: Soft waist belt X 10 hours.  Continue present medical therapy.      Intervention Category Major Interventions: Delirium, psychosis, severe agitation - evaluation and management  Aragorn Recker Eugene 12/17/2020, 10:35 PM

## 2020-12-17 NOTE — Procedures (Signed)
Patient Name: Nathan Mitchell  MRN: [id number]  Epilepsy Attending: Charlsie Quest  Referring Physician/Provider: Dr Lynnell Catalan Date: 12/16/2020 Duration: 24.37 mins  Patient history: 87 year old male with altered mental status.  EEG to evaluate for seizures.  Level of alertness: Asleep  AEDs during EEG study: None  Technical aspects: This EEG study was done with scalp electrodes positioned according to the 10-20 International system of electrode placement. Electrical activity was acquired at a sampling rate of 500Hz  and reviewed with a high frequency filter of 70Hz  and a low frequency filter of 1Hz . EEG data were recorded continuously and digitally stored.   Description: Sleep was characterized by sleep spindles (12 to 14 Hz), maximal frontocentral region. EEG showed continuous generalized 3 to 6 Hz theta-delta slowing. Hyperventilation and photic stimulation were not performed.     ABNORMALITY - Continuous slow, generalized  IMPRESSION: This study is suggestive of moderate diffuse encephalopathy, nonspecific etiology. No seizures or epileptiform discharges were seen throughout the recording.  Rehema Muffley 

## 2020-12-17 NOTE — Progress Notes (Addendum)
NAME:  Nathan Mitchell, MRN:  [id number], DOB:  01-Aug-1930, LOS: 1 ADMISSION DATE:  2021-01-07, CONSULTATION DATE:  12/16/2020 REFERRING MD:  Dr. Roda Shutters, CHIEF COMPLAINT:  Encephalopathy   History of Present Illness:  87 year old male presents to ED 8/16 with altered mental status, decreased oral intake, and generalized fatigue. On arrival with tachycardia, respiratory distress with expiratory wheezes. Family reports over last 24 hours he has become progressively confused and combative. CT head with stable left SDH. CXR with cardiomegaly with chronic interstitial thickening. Sodium 129. Crt 1.79. COVID negative. WBC 14.7. Started on Vancomycin/Cefepime. Throughout the night with continued confusion. Given Zyprexa 2.5 mg x 1. This AM patient with progressive lethargy. Critical Care consulted for evaluation.   Of note patient was admitted 6/9 s/p fall found to have fractured femur and large left SDH, underwent IM nail for femur fracture, discharged with foley catheter. Family reports patient was seen in ED on 8/12 and diagnosed with UTI and AKI. Discharged with Keflex.   Pertinent  Medical History  Aortic Regurgitation, giant cell arteritis, optic neuropathy, COPD, A.Fib, HLD, GERD, Depression, TBI, thoracic aortic aneurysm  Significant Hospital Events: Including procedures, antibiotic start and stop dates in addition to other pertinent events   8/16 Admitted.   Interim History / Subjective:  No events Overnight.   This morning patient difficult to arouse, answering in one-2 word answers. AAOx0  Objective   Blood pressure 122/72, pulse 84, temperature (!) 97.5 F (36.4 C), temperature source Oral, resp. rate (!) 22, height 6\' 1"  (1.854 m), weight 76.4 kg, SpO2 96 %.        Intake/Output Summary (Last 24 hours) at 12/17/2020 0814 Last data filed at 12/17/2020 0700 Gross per 24 hour  Intake 1105.44 ml  Output 2550 ml  Net -1444.56 ml   Filed Weights   01/07/21 1346 12/17/20 0400   Weight: 80.3 kg 76.4 kg   Examination: Physical Exam Constitutional:      General: He is not in acute distress.    Appearance: He is ill-appearing. He is not toxic-appearing or diaphoretic.     Comments: Chronically ill appearing, did not respond to vocal stimuli, responsive to sternal rub.    Cardiovascular:     Rate and Rhythm: Normal rate. Rhythm irregular.     Heart sounds: No murmur heard.   No friction rub. No gallop.  Pulmonary:     Effort: Pulmonary effort is normal. No respiratory distress.     Breath sounds: Rhonchi present. No wheezing or rales.     Comments: Saturating at 95% on 4L [place] Musculoskeletal:     Right lower leg: No edema.     Left lower leg: No edema.  Skin:    General: Skin is warm and dry.  Neurological:     Comments: Alert and Oriented x0, moving extremities voluntarily.      Resolved Hospital Problem list     Assessment & Plan:   Encephalopathy Sepsis likely 2/2 to UTI Hx of Left SDH Hx of Depression:   CT negative for acute worsening findings in the setting of SDH. Currently on Metro, Vanc, Cefepime, Urine culture growing GNR. Febrile O/N with a Tmax 103 F. 12/19/20 Renal showing mild L sided hydronephrosis, but otherwise unremarkable. EEG negative for seizure activity. Given urinalysis, will start patient on ceftriaxone, can broaden pending sensitivities.  ADDENDUM: Urine cultures grew pseudomonas, will restart cefepime and await sensitivities.  - Avoid sedatives - Hold home Lexapro - Follow up Cultures and  sensitives  - Discontinue Vancomycin/Metronidazole/Ceftriaxone - Restart Cefepime  AKI in the Setting of Sepsis:  sCr of 1.65, this AM - Trend BMP -Replace electrolytes as indicated   COPD:   Was noted to be wheezing yesterday, rhonchi appreciated this AM.  -Continue Duoneb and Pulmicort -Titrate supplemental oxygen for saturation goal >88   Atrial Fibrillation Nonsustained V. Tach:  Not on anticoagulation 2/2 to fall with subsequent  SDH. Currently rate controlled. Noted to have nonsustained V. Tach on tele this AM, Mg 2.0, K: 3.9.  Will replete Mg and K as needed, assess cardiac function with formal echo.  - Echo - Klor-Con Cr tablet 40 mEq once - Trend Mg and BMP  GERD:  - Pepcid 20 mg QD    Best Practice (right click and "Reselect all SmartList Selections" daily)   Diet/type: NPO DVT prophylaxis: Lovenox  GI prophylaxis: H2B Lines: N/A Foley:  Yes, and it is still needed Code Status:  full code Last date of multidisciplinary goals of care discussion [pending]  Labs   CBC: Recent Labs  Lab 12/11/20 1433 August 28, 2020 1356 August 28, 2020 1425 12/16/20 0231 12/17/20 0153  WBC 12.4*  --  16.9* 14.7* 13.4*  NEUTROABS  --   --  15.0*  --   --   HGB 9.9* 9.2* 8.9* 8.2* 8.3*  HCT 31.5* 27.0* 28.0* 27.1* 26.5*  MCV 88.2  --  87.0 90.0 87.7  PLT 316  --  264 236 230    Basic Metabolic Panel: Recent Labs  Lab 12/11/20 1433 August 28, 2020 1356 August 28, 2020 1425 12/16/20 0231 12/17/20 0153  NA 133* 130* 129* 133* 137  K 4.5 4.8 4.9 4.2 3.9  CL 101  --  97* 102 105  CO2 23  --  23 21* 22  GLUCOSE 130*  --  112* 106* 127*  BUN 25*  --  26* 27* 33*  CREATININE 1.74*  --  1.79* 1.63* 1.65*  CALCIUM 8.6*  --  8.2* 8.2* 8.5*  MG  --   --   --  1.8 2.0  PHOS  --   --   --   --  4.6   GFR: Estimated Creatinine Clearance: 33.4 mL/min (A) (by C-G formula based on SCr of 1.65 mg/dL (H)). Recent Labs  Lab 12/11/20 1433 August 28, 2020 1425 August 28, 2020 1434 August 28, 2020 1630 12/16/20 0231 12/17/20 0153  PROCALCITON  --   --   --   --  0.85  --   WBC 12.4* 16.9*  --   --  14.7* 13.4*  LATICACIDVEN  --   --  1.1 1.5  --   --     Liver Function Tests: Recent Labs  Lab August 28, 2020 1425 12/16/20 0231  AST 13* 14*  ALT 13 13  ALKPHOS 61 53  BILITOT 1.0 1.0  PROT 6.1* 5.9*  ALBUMIN 2.5* 2.5*   No results for input(s): LIPASE, AMYLASE in the last 168 hours. Recent Labs  Lab 12/16/20 1624  AMMONIA 29    ABG    Component Value  Date/Time   PHART 7.403 12/16/2020 1312   PCO2ART 35.9 12/16/2020 1312   PO2ART 84.3 12/16/2020 1312   HCO3 21.6 12/16/2020 1312   TCO2 25 23-Jun-2020 1356   ACIDBASEDEF 2.2 (H) 12/16/2020 1312   O2SAT 95.4 12/16/2020 1312     Coagulation Profile: Recent Labs  Lab August 28, 2020 1425 12/16/20 0231  INR 1.3* 1.3*    Cardiac Enzymes: No results for input(s): CKTOTAL, CKMB, CKMBINDEX, TROPONINI in the last 168 hours.  HbA1C: Hgb  A1c MFr Bld  Date/Time Value Ref Range Status  12/16/2020 04:24 PM 5.8 (H) 4.8 - 5.6 % Final    Comment:    (NOTE) Pre diabetes:          5.7%-6.4%  Diabetes:              >6.4%  Glycemic control for   <7.0% adults with diabetes     CBG: Recent Labs  Lab 12/16/20 1549 12/16/20 2010 12/16/20 2311 12/17/20 0327 12/17/20 0727  GLUCAP 115* 130* 129* 116* 113*    Review of Systems:   Negative with exception to above  Past Medical History:  He,  has a past medical history of Aortic atherosclerosis (HCC), Atrial fibrillation (HCC), Giant cell arteritis (HCC), Osteopenia, Pure hypercholesterolemia, Thoracic aortic aneurysm (HCC), and Thrombophilia (HCC).   Surgical History:   Past Surgical History:  Procedure Laterality Date   APPENDECTOMY     CRANIOTOMY Left 10/08/2020   Procedure: LEFT BURR HOLE DRAINAGE OF SUBDURAL HEMATOMA;  Surgeon: Jadene Pierini, MD;  Location: [facility] OR;  Service: Neurosurgery;  Laterality: Left;   HERNIA REPAIR     INTRAMEDULLARY (IM) NAIL INTERTROCHANTERIC Right 10/08/2020   Procedure: RIGHT INTRAMEDULLARY (IM) NAIL INTERTROCHANTRIC WITH PREVENA WOUND VAC APPLICATION;  Surgeon: Tarry Kos, MD;  Location: [facility] OR;  Service: Orthopedics;  Laterality: Right;     Social History:   reports that he has quit smoking. He has never used smokeless tobacco. He reports current alcohol use of about 3.0 standard drinks per week. He reports that he does not use drugs.   Family History:  His family history includes Cancer in his  brother; Parkinson's disease in his father; Stroke in his brother.   Allergies Allergies  Allergen Reactions   Nsaids     Due to afib   Tiotropium Bromide Monohydrate     Other reaction(s): ineffecticve   Umeclidinium-Vilanterol     Other reaction(s): ineffective     Home Medications  Prior to Admission medications   Medication Sig Start Date End Date Taking? Authorizing Provider  acetaminophen (TYLENOL) 325 MG tablet Take 2 tablets (650 mg total) by mouth 3 (three) times daily. Patient taking differently: Take 650 mg by mouth every 6 (six) hours as needed for moderate pain. 11/04/20   Love, Evlyn Kanner, PA-C  albuterol (VENTOLIN HFA) 108 (90 Base) MCG/ACT inhaler Inhale 1-2 puffs into the lungs every 6 (six) hours as needed for wheezing or shortness of breath.    [provider]  CRANBERRY EXTRACT PO Take 1 capsule by mouth at bedtime.    [provider]  diclofenac Sodium (VOLTAREN) 1 % GEL Apply 2 g topically daily as needed (hip pain).    [provider]  docusate sodium (COLACE) 100 MG capsule Take 1 capsule (100 mg total) by mouth 2 (two) times daily. Need to purchase over the counter Patient taking differently: Take 100 mg by mouth daily as needed for mild constipation. 11/04/20   Love, Evlyn Kanner, PA-C  escitalopram (LEXAPRO) 20 MG tablet Take 20 mg by mouth daily.    [provider]  famotidine (PEPCID) 40 MG tablet Take 40 mg by mouth at bedtime. 01/30/19   [provider]  ferrous gluconate (FERGON) 240 (27 FE) MG tablet Take 240 mg by mouth every Monday, Wednesday, and Friday.    [provider]  folic acid (FOLVITE) 1 MG tablet Take 1 mg by mouth daily. 12/02/14   [provider]  melatonin 3 MG TABS  tablet Take 1 tablet (3 mg total) by mouth at bedtime. Purchase over the counter Patient taking differently: Take 3 mg by mouth at bedtime as needed (sleep). 11/04/20   Love, Evlyn Kanner, PA-C  methocarbamol (ROBAXIN) 500 MG tablet  TAKE ONE TABLET DAILY FOR MUSCLE SPASM Patient not taking: No sig reported 11/26/20   Cristie Hem, PA-C  methotrexate (RHEUMATREX) 2.5 MG tablet Take 20 mg by mouth every Friday. 04/24/15   [provider]  Omeprazole 20 MG TBEC Take 20 mg by mouth daily.     [provider]  polyethylene glycol (MIRALAX / GLYCOLAX) 17 g packet Take 17 g by mouth 2 (two) times daily. Need to purchase over the counter Patient taking differently: Take 17 g by mouth daily as needed for moderate constipation. Need to purchase over the counter 11/04/20   Love, Evlyn Kanner, PA-C  tamsulosin (FLOMAX) 0.4 MG CAPS capsule Take 0.8 mg by mouth daily.    [provider]  vitamin B-12 (CYANOCOBALAMIN) 1000 MCG tablet Take 1,000 mcg by mouth daily.    [provider]   Dolan Amen, MD IMTS, PGY-3 Pager: [phone] 12/17/2020,8:15 AM

## 2020-12-17 NOTE — Progress Notes (Signed)
PHARMACIST - PHYSICIAN COMMUNICATION  DR:  Merrily Pew   CONCERNING: IV to Oral Route Change Policy  RECOMMENDATION: This patient is receiving famotidine 20 mg by the intravenous route.  Based on criteria approved by the Pharmacy and Therapeutics Committee, the intravenous medication(s) is/are being converted to the equivalent oral dose form(s).   DESCRIPTION: These criteria include: The patient is eating (either orally or via tube) and/or has been taking other orally administered medications for a least 24 hours The patient has no evidence of active gastrointestinal bleeding or impaired GI absorption (gastrectomy, short bowel, patient on TNA or NPO).  If you have questions about this conversion, please contact the Pharmacy Department  []   [phone] )  ( [phone]   Redge Gainer, PharmD PGY-1 Acute Care Resident  12/17/2020 8:21 AM

## 2020-12-18 ENCOUNTER — Inpatient Hospital Stay (HOSPITAL_COMMUNITY): Payer: Medicare PPO

## 2020-12-18 DIAGNOSIS — G9341 Metabolic encephalopathy: Secondary | ICD-10-CM | POA: Diagnosis not present

## 2020-12-18 DIAGNOSIS — R6520 Severe sepsis without septic shock: Secondary | ICD-10-CM | POA: Diagnosis not present

## 2020-12-18 DIAGNOSIS — A419 Sepsis, unspecified organism: Secondary | ICD-10-CM | POA: Diagnosis not present

## 2020-12-18 DIAGNOSIS — N390 Urinary tract infection, site not specified: Secondary | ICD-10-CM | POA: Diagnosis not present

## 2020-12-18 DIAGNOSIS — I4891 Unspecified atrial fibrillation: Secondary | ICD-10-CM | POA: Diagnosis not present

## 2020-12-18 LAB — GLUCOSE, CAPILLARY
Glucose-Capillary: 101 mg/dL — ABNORMAL HIGH (ref 70–99)
Glucose-Capillary: 92 mg/dL (ref 70–99)
Glucose-Capillary: 104 mg/dL — ABNORMAL HIGH (ref 70–99)
Glucose-Capillary: 106 mg/dL — ABNORMAL HIGH (ref 70–99)
Glucose-Capillary: 104 mg/dL — ABNORMAL HIGH (ref 70–99)
Glucose-Capillary: 99 mg/dL (ref 70–99)

## 2020-12-18 LAB — CBC
HCT: 28.8 % — ABNORMAL LOW (ref 39.0–52.0)
Hemoglobin: 8.8 g/dL — ABNORMAL LOW (ref 13.0–17.0)
MCH: 27 pg (ref 26.0–34.0)
MCHC: 30.6 g/dL (ref 30.0–36.0)
MCV: 88.3 fL (ref 80.0–100.0)
Platelets: 295 10*3/uL (ref 150–400)
RBC: 3.26 MIL/uL — ABNORMAL LOW (ref 4.22–5.81)
RDW: 17.5 % — ABNORMAL HIGH (ref 11.5–15.5)
WBC: 14 10*3/uL — ABNORMAL HIGH (ref 4.0–10.5)
nRBC: 0 % (ref 0.0–0.2)

## 2020-12-18 LAB — BASIC METABOLIC PANEL
Anion gap: 9 (ref 5–15)
BUN: 45 mg/dL — ABNORMAL HIGH (ref 8–23)
CO2: 24 mmol/L (ref 22–32)
Calcium: 8.7 mg/dL — ABNORMAL LOW (ref 8.9–10.3)
Chloride: 107 mmol/L (ref 98–111)
Creatinine, Ser: 1.79 mg/dL — ABNORMAL HIGH (ref 0.61–1.24)
GFR, Estimated: 36 mL/min — ABNORMAL LOW (ref >60)
Glucose, Bld: 107 mg/dL — ABNORMAL HIGH (ref 70–99)
Potassium: 4.5 mmol/L (ref 3.5–5.1)
Sodium: 140 mmol/L (ref 135–145)

## 2020-12-18 LAB — URINE CULTURE: Culture: >=100000 — AB

## 2020-12-18 LAB — MAGNESIUM: Magnesium: 2.2 mg/dL (ref 1.7–2.4)

## 2020-12-18 MED ORDER — DEXMEDETOMIDINE HCL IN NACL 400 MCG/100ML IV SOLN
0.4000 ug/kg/h | INTRAVENOUS | Status: AC
  Administered 2020-12-18: 0.4 ug/kg/h via INTRAVENOUS
  Administered 2020-12-19: 0.3 ug/kg/h via INTRAVENOUS
  Administered 2020-12-19: 0.2 ug/kg/h via INTRAVENOUS
  Administered 2020-12-20: 0.9 ug/kg/h via INTRAVENOUS
  Administered 2020-12-20: 0.4 ug/kg/h via INTRAVENOUS
  Administered 2020-12-21: 0.7 ug/kg/h via INTRAVENOUS
  Administered 2020-12-21: 0.9 ug/kg/h via INTRAVENOUS
  Administered 2020-12-21: 0.7 ug/kg/h via INTRAVENOUS
  Administered 2020-12-21: 1 ug/kg/h via INTRAVENOUS
  Administered 2020-12-22: 0.7 ug/kg/h via INTRAVENOUS
  Filled 2020-12-18 (×9): qty 100

## 2020-12-18 MED ORDER — LORAZEPAM 2 MG/ML IJ SOLN
2.0000 mg | Freq: Once | INTRAMUSCULAR | Status: AC
  Administered 2020-12-18: 2 mg via INTRAVENOUS

## 2020-12-18 MED ORDER — LACTATED RINGERS IV SOLN: INTRAVENOUS | Status: DC

## 2020-12-18 MED ORDER — ORAL CARE MOUTH RINSE
15.0000 mL | Freq: Two times a day (BID) | OROMUCOSAL | Status: DC
Start: 2020-12-18 — End: 2020-12-27
  Administered 2020-12-18 – 2020-12-26 (×15): 15 mL via OROMUCOSAL

## 2020-12-18 MED ORDER — HALOPERIDOL LACTATE 5 MG/ML IJ SOLN: 2.0000 mg | Freq: Once | INTRAMUSCULAR | Status: DC

## 2020-12-18 MED ORDER — LACTATED RINGERS IV BOLUS
500.0000 mL | Freq: Once | INTRAVENOUS | Status: AC
  Administered 2020-12-18: 500 mL via INTRAVENOUS

## 2020-12-18 MED ORDER — LORAZEPAM 2 MG/ML IJ SOLN: 1.0000 mg | Freq: Once | INTRAMUSCULAR | Status: DC

## 2020-12-18 MED ORDER — LORAZEPAM 2 MG/ML IJ SOLN
INTRAMUSCULAR | Status: AC
  Filled 2020-12-18: qty 1

## 2020-12-18 NOTE — Progress Notes (Addendum)
Dr. Randol Kern called to bedside for increased patient agitation, patient combative, attempting to climb out of bed despite multiple staff trying to redirect patient and reposition in bed.   Dr. Randol Kern at bedside, gave verbal order for 1 mg Ativan IV. Ativan retrieved from Pyxis, Dr. Randol Kern ordered for additional 1 mg Ativan to be given for a total dose of 2 mg. 2 mg Ativan given per order with MD at bedside. See MAR for administration.   Patient agitated despite 2 mg Ativan. CCM consulted, Posey Boyer and Dr. Merrily Pew at bedside evaluating patient.

## 2020-12-18 NOTE — Progress Notes (Signed)
PROGRESS NOTE    Nathan Mitchell  NID:[id number] DOB: 1931-02-18 DOA: 12/10/2020 PCP: Merlene Laughter, MD   Chief Complaint  Patient presents with   Altered Mental Status    Brief Narrative:   87 year old male presents to ED 8/16 with altered mental status, decreased oral intake, and generalized fatigue. On arrival with tachycardia, respiratory distress with expiratory wheezes. Family reports over last 24 hours he has become progressively confused and combative. CT head with stable left SDH. CXR with cardiomegaly with chronic interstitial thickening. Sodium 129. Crt 1.79. COVID negative. WBC 14.7. Started on Vancomycin/Cefepime. Throughout the night with continued confusion. Given Zyprexa 2.5 mg x 1.    Of note patient was admitted 6/9 s/p fall found to have fractured femur and large left SDH, underwent IM nail for femur fracture, discharged with foley catheter. Family reports patient was seen in ED on 8/12 and diagnosed with UTI and AKI. Discharged with Keflex. -Was transfused to ICU given worsening mental status, but overall his mentation has improved, transferred back [facility] 8/19.      Assessment & Plan:   Principal Problem:   Sepsis (HCC) Active Problems:   Atrial fibrillation (HCC)   Chronic obstructive pulmonary disease, unspecified (HCC)   Dyslipidemia   Gastroesophageal reflux disease   Major depression in remission (HCC)   TBI (traumatic brain injury) (HCC)   Acute lower UTI   Acute metabolic encephalopathy  Sepsis secondary to Pseudomonas UTI -Urine culture growing Pseudomonas -Initially on broad-spectrum antibiotics vancomycin/Flagyl/Rocephin, currently transitioned to cefepime. -Urine culture remains with no growth to date -Renal ultrasound showing mild left hydronephrosis, will check CT renal stone protocol  Acute metabolic encephalopathy/hospital delirium -Phototherapy due to sepsis/infection, resolved with hospital delirium -Requested bedside  sitter -Avoid sedatives -EEG with no seizures activity  AKI -Due to sepsis, avoid nephrotoxic medications  Recent left subdural hematoma status post fall  COPD:   -Continue Duoneb and Pulmicort -Titrate supplemental oxygen for saturation goal >88    Atrial Fibrillation - Not on anticoagulation 2/2 to fall with subsequent SDH. Currently rate controlled.   nonsustained V. Tach on tele  -1 episode yesterday, keep potassium > 4, magnesium >2. -Monitor on telemetry    GERD:  - Pepcid 20 mg QD    History of giant cell arteritis -Wife reports he only has residual 20% of his vision left, report he is off of methotrexate for few weeks now.   DVT prophylaxis: Lovenox Code Status: Full Family Communication: None at bedside, unable to leave his son's voicemail, I have discussed with wife by phone Disposition:   Status is: Inpatient  Remains inpatient appropriate because:IV treatments appropriate due to intensity of illness or inability to take PO  Dispo: The patient is from: Home              Anticipated d/c is to:  PT pending              Patient currently is not medically stable to d/c.   Difficult to place patient No       Consultants:  PCCM   Subjective:  Patient himself unable to provide any complaints, as discussed with staff has been restless, trying to get out of bed  Objective: Vitals:   12/18/20 0737 12/18/20 0800 12/18/20 0900 12/18/20 1000  BP:  133/61 (!) 127/116 (!) 102/48  Pulse:  (!) 116 (!) 137 (!) 119  Resp:  (!) 33 (!) 28 18  Temp:      TempSrc:  SpO2: 99% 97% 95% 96%  Weight:      Height:        Intake/Output Summary (Last 24 hours) at 12/18/2020 1103 Last data filed at 12/18/2020 0800 Gross per 24 hour  Intake 675.35 ml  Output 1500 ml  Net -824.65 ml   Filed Weights   December 22, 2020 1346 12/17/20 0400 12/18/20 0359  Weight: 80.3 kg 76.4 kg 75.7 kg    Examination:  Awake Alert, frail, deconditioned ,restless, mittens and soft waist  belt restraint Symmetrical Chest wall movement, Good air movement bilaterally, CTAB RRR,No Gallops,Rubs or new Murmurs, No Parasternal Heave +ve B.Sounds, Abd Soft, No tenderness, No rebound - guarding or rigidity. No Cyanosis, Clubbing or edema, No new Rash or bruise      Data Reviewed: I have personally reviewed following labs and imaging studies  CBC: Recent Labs  Lab 12/11/20 1433 Dec 22, 2020 1356 December 22, 2020 1425 12/16/20 0231 12/17/20 0153 12/18/20 0205  WBC 12.4*  --  16.9* 14.7* 13.4* 14.0*  NEUTROABS  --   --  15.0*  --   --   --   HGB 9.9* 9.2* 8.9* 8.2* 8.3* 8.8*  HCT 31.5* 27.0* 28.0* 27.1* 26.5* 28.8*  MCV 88.2  --  87.0 90.0 87.7 88.3  PLT 316  --  264 236 230 295    Basic Metabolic Panel: Recent Labs  Lab 12/11/20 1433 December 22, 2020 1356 2020-12-22 1425 12/16/20 0231 12/17/20 0153 12/18/20 0205  NA 133* 130* 129* 133* 137 140  K 4.5 4.8 4.9 4.2 3.9 4.5  CL 101  --  97* 102 105 107  CO2 23  --  23 21* 22 24  GLUCOSE 130*  --  112* 106* 127* 107*  BUN 25*  --  26* 27* 33* 45*  CREATININE 1.74*  --  1.79* 1.63* 1.65* 1.79*  CALCIUM 8.6*  --  8.2* 8.2* 8.5* 8.7*  MG  --   --   --  1.8 2.0 2.2  PHOS  --   --   --   --  4.6  --     GFR: Estimated Creatinine Clearance: 30.5 mL/min (A) (by C-G formula based on SCr of 1.79 mg/dL (H)).  Liver Function Tests: Recent Labs  Lab 12/22/20 1425 12/16/20 0231  AST 13* 14*  ALT 13 13  ALKPHOS 61 53  BILITOT 1.0 1.0  PROT 6.1* 5.9*  ALBUMIN 2.5* 2.5*    CBG: Recent Labs  Lab 12/17/20 1523 12/17/20 1942 12/17/20 2337 12/18/20 0338 12/18/20 0725  GLUCAP 109* 108* 97 101* 92     Recent Results (from the past 240 hour(s))  Resp Panel by RT-PCR (Flu A&B, Covid) Nasopharyngeal Swab     Status: None   Collection Time: 12/11/20  2:11 PM   Specimen: Nasopharyngeal Swab; Nasopharyngeal(NP) swabs in vial transport medium  Result Value Ref Range Status   SARS Coronavirus 2 by RT PCR NEGATIVE NEGATIVE Final     Comment: (NOTE) SARS-CoV-2 target nucleic acids are NOT DETECTED.  The SARS-CoV-2 RNA is generally detectable in upper respiratory specimens during the acute phase of infection. The lowest concentration of SARS-CoV-2 viral copies this assay can detect is 138 copies/mL. A negative result does not preclude SARS-Cov-2 infection and should not be used as the sole basis for treatment or other patient management decisions. A negative result may occur with  improper specimen collection/handling, submission of specimen other than nasopharyngeal swab, presence of viral mutation(s) within the areas targeted by this assay, and inadequate number of viral copies(<138  copies/mL). A negative result must be combined with clinical observations, patient history, and epidemiological information. The expected result is Negative.  Fact Sheet for Patients:  BloggerCourse.com  Fact Sheet for Healthcare Providers:  SeriousBroker.it  This test is no t yet approved or cleared by the [place] FDA and  has been authorized for detection and/or diagnosis of SARS-CoV-2 by FDA under an Emergency Use Authorization (EUA). This EUA will remain  in effect (meaning this test can be used) for the duration of the COVID-19 declaration under Section 564(b)(1) of the Act, 21 U.S.C.section 360bbb-3(b)(1), unless the authorization is terminated  or revoked sooner.       Influenza A by PCR NEGATIVE NEGATIVE Final   Influenza B by PCR NEGATIVE NEGATIVE Final    Comment: (NOTE) The Xpert Xpress SARS-CoV-2/FLU/RSV plus assay is intended as an aid in the diagnosis of influenza from Nasopharyngeal swab specimens and should not be used as a sole basis for treatment. Nasal washings and aspirates are unacceptable for Xpert Xpress SARS-CoV-2/FLU/RSV testing.  Fact Sheet for Patients: BloggerCourse.com  Fact Sheet for Healthcare  Providers: SeriousBroker.it  This test is not yet approved or cleared by the [place] FDA and has been authorized for detection and/or diagnosis of SARS-CoV-2 by FDA under an Emergency Use Authorization (EUA). This EUA will remain in effect (meaning this test can be used) for the duration of the COVID-19 declaration under Section 564(b)(1) of the Act, 21 U.S.C. section 360bbb-3(b)(1), unless the authorization is terminated or revoked.  Performed at [facility], [address], [place], [place] [address]   Resp Panel by RT-PCR (Flu A&B, Covid) Nasopharyngeal Swab     Status: None   Collection Time: 01-08-2021  2:02 PM   Specimen: Nasopharyngeal Swab; Nasopharyngeal(NP) swabs in vial transport medium  Result Value Ref Range Status   SARS Coronavirus 2 by RT PCR NEGATIVE NEGATIVE Final    Comment: (NOTE) SARS-CoV-2 target nucleic acids are NOT DETECTED.  The SARS-CoV-2 RNA is generally detectable in upper respiratory specimens during the acute phase of infection. The lowest concentration of SARS-CoV-2 viral copies this assay can detect is 138 copies/mL. A negative result does not preclude SARS-Cov-2 infection and should not be used as the sole basis for treatment or other patient management decisions. A negative result may occur with  improper specimen collection/handling, submission of specimen other than nasopharyngeal swab, presence of viral mutation(s) within the areas targeted by this assay, and inadequate number of viral copies(<138 copies/mL). A negative result must be combined with clinical observations, patient history, and epidemiological information. The expected result is Negative.  Fact Sheet for Patients:  BloggerCourse.com  Fact Sheet for Healthcare Providers:  SeriousBroker.it  This test is no t yet approved or cleared by the [place] FDA and  has been  authorized for detection and/or diagnosis of SARS-CoV-2 by FDA under an Emergency Use Authorization (EUA). This EUA will remain  in effect (meaning this test can be used) for the duration of the COVID-19 declaration under Section 564(b)(1) of the Act, 21 U.S.C.section 360bbb-3(b)(1), unless the authorization is terminated  or revoked sooner.       Influenza A by PCR NEGATIVE NEGATIVE Final   Influenza B by PCR NEGATIVE NEGATIVE Final    Comment: (NOTE) The Xpert Xpress SARS-CoV-2/FLU/RSV plus assay is intended as an aid in the diagnosis of influenza from Nasopharyngeal swab specimens and should not be used as a sole basis for treatment. Nasal washings and aspirates are unacceptable for Xpert Xpress  SARS-CoV-2/FLU/RSV testing.  Fact Sheet for Patients: BloggerCourse.com  Fact Sheet for Healthcare Providers: SeriousBroker.it  This test is not yet approved or cleared by the [place] FDA and has been authorized for detection and/or diagnosis of SARS-CoV-2 by FDA under an Emergency Use Authorization (EUA). This EUA will remain in effect (meaning this test can be used) for the duration of the COVID-19 declaration under Section 564(b)(1) of the Act, 21 U.S.C. section 360bbb-3(b)(1), unless the authorization is terminated or revoked.  Performed at [facility], [address], [place], [place] [address]   Urine Culture     Status: Abnormal   Collection Time: 12/31/2020  2:02 PM   Specimen: In/Out Cath Urine  Result Value Ref Range Status   Specimen Description IN/OUT CATH URINE  Final   Special Requests   Final    NONE Performed at [facility], [address], [place], [place] [address]    Culture >=100,000 COLONIES/mL PSEUDOMONAS AERUGINOSA (A)  Final   Report Status 12/18/2020 FINAL  Final   Organism ID, Bacteria PSEUDOMONAS AERUGINOSA (A)  Final      Susceptibility   Pseudomonas aeruginosa - MIC*     CEFTAZIDIME 4 SENSITIVE Sensitive     CIPROFLOXACIN <=0.25 SENSITIVE Sensitive     GENTAMICIN 4 SENSITIVE Sensitive     IMIPENEM 2 SENSITIVE Sensitive     PIP/TAZO 8 SENSITIVE Sensitive     CEFEPIME 4 SENSITIVE Sensitive     * >=100,000 COLONIES/mL PSEUDOMONAS AERUGINOSA  Blood Culture (routine x 2)     Status: None (Preliminary result)   Collection Time: 2020/12/31  2:45 PM   Specimen: BLOOD  Result Value Ref Range Status   Specimen Description BLOOD SITE NOT SPECIFIED  Final   Special Requests   Final    BOTTLES DRAWN AEROBIC AND ANAEROBIC Blood Culture results may not be optimal due to an inadequate volume of blood received in culture bottles   Culture   Final    NO GROWTH 3 DAYS Performed at [facility], [address], [place], [place] [address]    Report Status PENDING  Incomplete  Blood Culture (routine x 2)     Status: None (Preliminary result)   Collection Time: December 31, 2020  2:50 PM   Specimen: BLOOD  Result Value Ref Range Status   Specimen Description BLOOD SITE NOT SPECIFIED  Final   Special Requests   Final    BOTTLES DRAWN AEROBIC AND ANAEROBIC Blood Culture adequate volume   Culture   Final    NO GROWTH 3 DAYS Performed at [facility], [address], [place], [place] [address]    Report Status PENDING  Incomplete  MRSA Next Gen by PCR, Nasal     Status: None   Collection Time: 12/16/20  4:43 PM   Specimen: Nasal Mucosa; Nasal Swab  Result Value Ref Range Status   MRSA by PCR Next Gen NOT DETECTED NOT DETECTED Final    Comment: (NOTE) The GeneXpert MRSA Assay (FDA approved for NASAL specimens only), is one component of a comprehensive MRSA colonization surveillance program. It is not intended to diagnose MRSA infection nor to guide or monitor treatment for MRSA infections. Test performance is not FDA approved in patients less than 22 years old. Performed at [facility], [address] [place], [place] [address]          Radiology  Studies: CT HEAD WO CONTRAST ( )  Result Date: 12/16/2020 CLINICAL DATA:  Delirium.  Worsened mental status. EXAM: CT HEAD WITHOUT CONTRAST TECHNIQUE: Contiguous axial images were obtained from the base of the skull through the vertex without intravenous contrast. COMPARISON:  12/26/2020 FINDINGS: Brain: Severely motion degraded exam. No change or acute finding. Low-density left-sided chronic subdural collection with maximal thickness of 12-13 mm. Mild mass-effect upon the brain but no midline shift, because of generalized atrophy. Old small vessel infarction in the left thalamus. Vascular: There is atherosclerotic calcification of the major vessels at the base of the brain. Skull: Left frontoparietal burr hole.  Otherwise negative. Sinuses/Orbits: Clear/normal as seen with motion. Other: None IMPRESSION: Motion degraded examination. No new or worsening finding. Chronic low-density subdural collection on the left maximal thickness of 12-13 mm. Electronically Signed   By: Paulina Fusi M.D.   On: 12/16/2020 16:03   US RENAL  Result Date: 12/16/2020 CLINICAL DATA:  Urinary tract infection. EXAM: RENAL / URINARY TRACT ULTRASOUND COMPLETE COMPARISON:  Sep 05, 2020. FINDINGS: Right Kidney: Renal measurements: 10.9 x 5.9 x 5.3 cm = volume: 179 mL. Echogenicity within normal limits. No mass or hydronephrosis visualized. Left Kidney: Renal measurements: 10.9 x 5.4 x 4.6 cm = volume: 142 mL. Mild left hydronephrosis is noted. Echogenicity within normal limits. No mass visualized. Bladder: Decompressed secondary to Foley catheter. Other: None. IMPRESSION: Mild left hydronephrosis is noted. CT urogram is recommended evaluate for distal ureteral obstruction. Grossly normal right kidney. Electronically Signed   By: Lupita Raider M.D.   On: 12/16/2020 20:45   DG CHEST PORT 1 VIEW  Result Date: 12/16/2020 CLINICAL DATA:  Hypoxia.  Altered mental status. EXAM: PORTABLE CHEST 1 VIEW COMPARISON:  12/22/2020 FINDINGS:  Moderate cardiomegaly remains stable. Aortic atherosclerotic calcification noted. Chronic pulmonary interstitial prominence is again seen. No evidence of acute infiltrate or pleural effusion. IMPRESSION: Stable cardiomegaly chronic pulmonary interstitial prominence. No acute findings. Electronically Signed   By: Danae Orleans M.D.   On: 12/16/2020 15:06   EEG adult  Result Date: 12/17/2020 Charlsie Quest, MD     12/17/2020  8:47 AM Patient Name: Nathan Mitchell MRN: [id number] Epilepsy Attending: Charlsie Quest Referring Physician/Provider: Dr Lynnell Catalan Date: 12/16/2020 Duration: 24.37 mins Patient history: 87 year old male with altered mental status.  EEG to evaluate for seizures. Level of alertness: Asleep AEDs during EEG study: None Technical aspects: This EEG study was done with scalp electrodes positioned according to the 10-20 International system of electrode placement. Electrical activity was acquired at a sampling rate of  and reviewed with a high frequency filter of  and a low frequency filter of . EEG data were recorded continuously and digitally stored. Description: Sleep was characterized by sleep spindles (12 to 14 Hz), maximal frontocentral region. EEG showed continuous generalized 3 to 6 Hz theta-delta slowing. Hyperventilation and photic stimulation were not performed.   ABNORMALITY - Continuous slow, generalized IMPRESSION: This study is suggestive of moderate diffuse encephalopathy, nonspecific etiology. No seizures or epileptiform discharges were seen throughout the recording. Charlsie Quest   ECHOCARDIOGRAM COMPLETE  Result Date: 12/17/2020    ECHOCARDIOGRAM REPORT   Patient Name:   Nathan Mitchell Date of Exam: 12/17/2020 Medical Rec #:  [id number]            Height:       73.0 in Accession #:    [id number]           Weight:       168.4 lb Date of Birth:  07-21-1930  BSA:          2.000 m Patient Age:    89 years             BP:           115/79 mmHg  Patient Gender: M                    HR:           102 bpm. Exam Location:  Inpatient Procedure: 2D Echo, Cardiac Doppler and Color Doppler Indications:    Heart block, Complete I44.2  History:        Patient has prior history of Echocardiogram examinations, most                 recent 12/30/2017.  Sonographer:    Roosvelt Maserachel Lane RDCS Referring Phys: [id number] SUDHAM CHAND IMPRESSIONS  1. Left ventricular ejection fraction, by estimation, is 60 to 65%. The left ventricle has normal function. The left ventricle has no regional wall motion abnormalities. There is severe asymmetric left ventricular hypertrophy of the basal-septal segment. Left ventricular diastolic parameters are indeterminate.  2. Right ventricular systolic function is normal. The right ventricular size is normal. Tricuspid regurgitation signal is inadequate for assessing PA pressure.  3. The mitral valve is normal in structure. Mild mitral valve regurgitation.  4. The aortic valve is tricuspid. Aortic valve regurgitation is mild to moderate. Mild aortic valve sclerosis is present, with no evidence of aortic valve stenosis.  5. Aortic dilatation noted. There is dilatation of the aortic root, measuring 45 mm. There is dilatation of the ascending aorta, measuring 43 mm.  6. The inferior vena cava is normal in size with <50% respiratory variability, suggesting right atrial pressure of 8 mmHg. FINDINGS  Left Ventricle: Left ventricular ejection fraction, by estimation, is 60 to 65%. The left ventricle has normal function. The left ventricle has no regional wall motion abnormalities. The left ventricular internal cavity size was normal in size. There is  severe asymmetric left ventricular hypertrophy of the basal-septal segment. Left ventricular diastolic parameters are indeterminate. Right Ventricle: The right ventricular size is normal. No increase in right ventricular wall thickness. Right ventricular systolic function is normal. Tricuspid regurgitation signal  is inadequate for assessing PA pressure. Left Atrium: Left atrial size was normal in size. Right Atrium: Right atrial size was normal in size. Pericardium: There is no evidence of pericardial effusion. Mitral Valve: The mitral valve is normal in structure. Mild mitral valve regurgitation. Tricuspid Valve: The tricuspid valve is not well visualized. Tricuspid valve regurgitation is not demonstrated. Aortic Valve: The aortic valve is tricuspid. Aortic valve regurgitation is mild to moderate. Aortic regurgitation PHT measures 382 msec. Mild aortic valve sclerosis is present, with no evidence of aortic valve stenosis. Aortic valve mean gradient measures 3.0 mmHg. Aortic valve peak gradient measures 5.6 mmHg. Aortic valve area, by VTI measures 2.58 cm. Pulmonic Valve: The pulmonic valve was not well visualized. Pulmonic valve regurgitation is not visualized. Aorta: Aortic dilatation noted. There is dilatation of the aortic root, measuring 45 mm. There is dilatation of the ascending aorta, measuring 43 mm. Venous: The inferior vena cava is normal in size with less than 50% respiratory variability, suggesting right atrial pressure of 8 mmHg. IAS/Shunts: The interatrial septum was not well visualized.  LEFT VENTRICLE PLAX 2D LVIDd:         4.80 cm LVIDs:         3.10 cm LV PW:  1.30 cm LV IVS:        1.20 cm LVOT diam:     2.20 cm LV SV:         55 LV SV Index:   28 LVOT Area:     3.80 cm  RIGHT VENTRICLE RV Basal diam:  3.30 cm RV S prime:     10.30 cm/s TAPSE (M-mode): 2.2 cm LEFT ATRIUM           Index       RIGHT ATRIUM           Index LA diam:      4.20 cm 2.10 cm/m  RA Area:     21.90 cm LA Vol (A2C): 48.1 ml 24.05 ml/m RA Volume:   56.40 ml  28.20 ml/m LA Vol (A4C): 74.6 ml 37.30 ml/m  AORTIC VALVE AV Area (Vmax):    3.58 cm AV Area (Vmean):   3.45 cm AV Area (VTI):     2.58 cm AV Vmax:           118.00 cm/s AV Vmean:          81.400 cm/s AV VTI:            0.214 m AV Peak Grad:      5.6 mmHg AV Mean  Grad:      3.0 mmHg LVOT Vmax:         111.00 cm/s LVOT Vmean:        73.900 cm/s LVOT VTI:          0.145 m LVOT/AV VTI ratio: 0.68 AI PHT:            382 msec  AORTA Ao Root diam: 4.50 cm  SHUNTS Systemic VTI:  0.14 m Systemic Diam: 2.20 cm Epifanio Lesches MD Electronically signed by Epifanio Lesches MD Signature Date/Time: 12/17/2020/5:01:56 PM    Final         Scheduled Meds:  budesonide (PULMICORT) nebulizer solution  0.5 mg Nebulization BID   chlorhexidine  15 mL Mouth Rinse BID   Chlorhexidine Gluconate Cloth  6 each Topical Daily   enoxaparin (LOVENOX) injection  40 mg Subcutaneous Q24H   famotidine  20 mg Oral Daily   insulin aspart  0-9 Units Subcutaneous Q4H   ipratropium-albuterol  3 mL Nebulization Q6H WA   mouth rinse  15 mL Mouth Rinse q12n4p   sodium chloride flush  3 mL Intravenous Q12H   tamsulosin  0.8 mg Oral Daily   Continuous Infusions:  sodium chloride 10 mL/hr at 12/18/20 0800   ceFEPime (MAXIPIME) IV Stopped (12/18/20 0148)   [START ON 01/08/2021] lactated ringers       LOS: 2 days       Huey Bienenstock, MD [facility]   To contact the attending provider between 7A-7P or the covering provider during after hours 7P-7A, please log into the web site [email or web address] and access using universal [place] password for that web site. If you do not have the password, please call the hospital operator.  12/18/2020, 11:03 AM

## 2020-12-18 NOTE — Progress Notes (Addendum)
NAME:  Nathan Mitchell, MRN:  [id number], DOB:  02-Feb-1931, LOS: 2 ADMISSION DATE:  12/09/2020, CONSULTATION DATE:  12/16/2020 REFERRING MD:  Dr. Roda Shutters, CHIEF COMPLAINT:  Encephalopathy   History of Present Illness:  87 year old male presents to ED 8/16 with altered mental status, decreased oral intake, and generalized fatigue. On arrival with tachycardia, respiratory distress with expiratory wheezes. Family reports over last 24 hours he has become progressively confused and combative. CT head with stable left SDH. CXR with cardiomegaly with chronic interstitial thickening. Sodium 129. Crt 1.79. COVID negative. WBC 14.7. Started on Vancomycin/Cefepime. Throughout the night with continued confusion. Given Zyprexa 2.5 mg x 1. This AM patient with progressive lethargy. Critical Care consulted for evaluation.   Of note patient was admitted 6/9 s/p fall found to have fractured femur and large left SDH, underwent IM nail for femur fracture, discharged with foley catheter. Family reports patient was seen in ED on 8/12 and diagnosed with UTI and AKI. Discharged with Keflex.   Pertinent  Medical History  Aortic Regurgitation, giant cell arteritis, optic neuropathy, COPD, A.Fib, HLD, GERD, Depression, TBI, thoracic aortic aneurysm  Significant Hospital Events: Including procedures, antibiotic start and stop dates in addition to other pertinent events   8/16 Admitted.  8/19 to [facility]; PCCM re-consulted for delirium/ agitation  Interim History / Subjective:  Transferred to [facility] service 8/19, however pt with intermittent agitation/ confusion throughout the day, now with a sitter, meds limited by prolonged Qtc PCCM asked to re-evaluate for possible precedex.  S/p ativan 2mg  without much change  Objective   Blood pressure 132/87, pulse (!) 121, temperature 97.8 F (36.6 C), temperature source Oral, resp. rate (!) 22, height 6\' 1"  (1.854 m), weight 75.7 kg, SpO2 93 %.        Intake/Output Summary (Last 24  hours) at 12/18/2020 1711 Last data filed at 12/18/2020 1518 Gross per 24 hour  Intake 331.02 ml  Output 1300 ml  Net -968.98 ml    Filed Weights   11/30/2020 1346 12/17/20 0400 12/18/20 0359  Weight: 80.3 kg 76.4 kg 75.7 kg   Examination:  General:  Elderly male actively trying to get OOB currently in mittens and posey with sitter and wife at bedside.  HEENT: MM pink/dry, pupils 4/reactive Neuro: Awake, confused, verbal, follows some commands, MAE CV: afib, +2 distal pulses PULM:  non labored, CTA GI: soft, bs+, ND/ NT, foley  Extremities: warm/dry, no LE edema  Skin: no rashes   Resolved Hospital Problem list     Assessment & Plan:   Encephalopathy  Sepsis 2/2 to Pseudomonal UTI Hx of Left SDH Hx of Depression:   - CT negative for acute worsening findings in the setting of SDH.  - 12/19/20 Renal showing mild L sided hydronephrosis, but otherwise unremarkable - EEG negative for seizure activity.  P:  - neuro exam remains non focal, no need for repeat imaging.  - ?sun downing.  Will start on low dose precedex and reassess.   - delirium precautions  - continue cefepime per pharmacy, day 4/x - holding home lexapro  Prolonged Qtc - tele monitoring - avoid Qtc prolonging meds   AKI in the Setting of Sepsis:  sCr of 1.65-> 1.79 - decreased UOP today, poor PO take - LR bolus 12/20/20 f/b LR 75 ml/hr  - Trend BMP / urinary output - Replace electrolytes as indicated - Avoid nephrotoxic agents, ensure adequate renal perfusion  COPD:   - no AECOPD -Continue Duoneb and Pulmicort -  Titrate supplemental oxygen for saturation goal >88   Atrial Fibrillation Nonsustained V. Tach:  Not on anticoagulation 2/2 to fall with subsequent SDH. Currently rate controlled. Noted to have nonsustained V. Tach on 8/18 am - Echo with EF 60-65%, no wall motion abnormalities, severe asymmetric left ventricular hypertrophy of basal-septal, normal RV  - goal K>4, Mag > 2- currently within goal - may  need to add lopressor for rate control, will reassess after precedex  GERD:  - cont Pepcid 20 mg QD    Best Practice (right click and "Reselect all SmartList Selections" daily)   Diet/type: NPO DVT prophylaxis: Lovenox  GI prophylaxis: H2B Lines: N/A Foley:  Yes, and it is still needed Code Status:  full code Last date of multidisciplinary goals of care discussion [wife updated at bedside 8/19)  Labs   CBC: Recent Labs  Lab 01/04/2021 1356 2021-01-04 1425 12/16/20 0231 12/17/20 0153 12/18/20 0205  WBC  --  16.9* 14.7* 13.4* 14.0*  NEUTROABS  --  15.0*  --   --   --   HGB 9.2* 8.9* 8.2* 8.3* 8.8*  HCT 27.0* 28.0* 27.1* 26.5* 28.8*  MCV  --  87.0 90.0 87.7 88.3  PLT  --  264 236 230 295     Basic Metabolic Panel: Recent Labs  Lab 01/04/2021 1356 01/04/21 1425 12/16/20 0231 12/17/20 0153 12/18/20 0205  NA 130* 129* 133* 137 140  K 4.8 4.9 4.2 3.9 4.5  CL  --  97* 102 105 107  CO2  --  23 21* 22 24  GLUCOSE  --  112* 106* 127* 107*  BUN  --  26* 27* 33* 45*  CREATININE  --  1.79* 1.63* 1.65* 1.79*  CALCIUM  --  8.2* 8.2* 8.5* 8.7*  MG  --   --  1.8 2.0 2.2  PHOS  --   --   --  4.6  --     GFR: Estimated Creatinine Clearance: 30.5 mL/min (A) (by C-G formula based on SCr of 1.79 mg/dL (H)). Recent Labs  Lab Jan 04, 2021 1425 01/04/2021 1434 01-04-2021 1630 12/16/20 0231 12/17/20 0153 12/18/20 0205  PROCALCITON  --   --   --  0.85  --   --   WBC 16.9*  --   --  14.7* 13.4* 14.0*  LATICACIDVEN  --  1.1 1.5  --   --   --      Liver Function Tests: Recent Labs  Lab 01-04-2021 1425 12/16/20 0231  AST 13* 14*  ALT 13 13  ALKPHOS 61 53  BILITOT 1.0 1.0  PROT 6.1* 5.9*  ALBUMIN 2.5* 2.5*    No results for input(s): LIPASE, AMYLASE in the last 168 hours. Recent Labs  Lab 12/16/20 1624  AMMONIA 29     ABG    Component Value Date/Time   PHART 7.403 12/16/2020 1312   PCO2ART 35.9 12/16/2020 1312   PO2ART 84.3 12/16/2020 1312   HCO3 21.6 12/16/2020 1312    TCO2 25 2021-01-04 1356   ACIDBASEDEF 2.2 (H) 12/16/2020 1312   O2SAT 95.4 12/16/2020 1312      Coagulation Profile: Recent Labs  Lab Jan 04, 2021 1425 12/16/20 0231  INR 1.3* 1.3*     Cardiac Enzymes: No results for input(s): CKTOTAL, CKMB, CKMBINDEX, TROPONINI in the last 168 hours.  HbA1C: Hgb A1c MFr Bld  Date/Time Value Ref Range Status  12/16/2020 04:24 PM 5.8 (H) 4.8 - 5.6 % Final    Comment:    (NOTE) Pre diabetes:  5.7%-6.4%  Diabetes:              >6.4%  Glycemic control for   <7.0% adults with diabetes     CBG: Recent Labs  Lab 12/17/20 2337 12/18/20 0338 12/18/20 0725 12/18/20 1127 12/18/20 1527  GLUCAP 97 101* 92 104* 106*    CCT 30 mins  Posey Boyer, ACNP [facility] 12/18/2020, 5:30 PM  See Amion for pager If no response to pager, please call PCCM consult pager After 7:00 pm call Elink

## 2020-12-18 NOTE — Progress Notes (Signed)
eLink Physician-Brief Progress Note Patient Name: Nathan Mitchell DOB: May 20, 1930 MRN: [id number]   Date of Service  12/18/2020  HPI/Events of Note  Agitation - Already on Precedex IV infusion at 0.6 mcg/kg/hour. QTc interval = 0.511 seconds.  eICU Interventions  Plan: Bilateral soft wrist and waste belt X 12 hours.      Intervention Category Major Interventions: Delirium, psychosis, severe agitation - evaluation and management  Nataliya Graig Eugene 12/18/2020, 9:13 PM

## 2020-12-19 DIAGNOSIS — G9340 Encephalopathy, unspecified: Secondary | ICD-10-CM | POA: Diagnosis not present

## 2020-12-19 DIAGNOSIS — G9341 Metabolic encephalopathy: Secondary | ICD-10-CM | POA: Diagnosis not present

## 2020-12-19 DIAGNOSIS — A419 Sepsis, unspecified organism: Secondary | ICD-10-CM | POA: Diagnosis not present

## 2020-12-19 DIAGNOSIS — R6520 Severe sepsis without septic shock: Secondary | ICD-10-CM | POA: Diagnosis not present

## 2020-12-19 LAB — BASIC METABOLIC PANEL
Anion gap: 10 (ref 5–15)
BUN: 46 mg/dL — ABNORMAL HIGH (ref 8–23)
CO2: 22 mmol/L (ref 22–32)
Calcium: 8.6 mg/dL — ABNORMAL LOW (ref 8.9–10.3)
Chloride: 109 mmol/L (ref 98–111)
Creatinine, Ser: 1.87 mg/dL — ABNORMAL HIGH (ref 0.61–1.24)
GFR, Estimated: 34 mL/min — ABNORMAL LOW (ref >60)
Glucose, Bld: 109 mg/dL — ABNORMAL HIGH (ref 70–99)
Potassium: 4.5 mmol/L (ref 3.5–5.1)
Sodium: 141 mmol/L (ref 135–145)

## 2020-12-19 LAB — CBC
HCT: 25.5 % — ABNORMAL LOW (ref 39.0–52.0)
Hemoglobin: 8 g/dL — ABNORMAL LOW (ref 13.0–17.0)
MCH: 27.8 pg (ref 26.0–34.0)
MCHC: 31.4 g/dL (ref 30.0–36.0)
MCV: 88.5 fL (ref 80.0–100.0)
Platelets: 252 10*3/uL (ref 150–400)
RBC: 2.88 MIL/uL — ABNORMAL LOW (ref 4.22–5.81)
RDW: 17.6 % — ABNORMAL HIGH (ref 11.5–15.5)
WBC: 9.5 10*3/uL (ref 4.0–10.5)
nRBC: 0 % (ref 0.0–0.2)

## 2020-12-19 LAB — GLUCOSE, CAPILLARY
Glucose-Capillary: 111 mg/dL — ABNORMAL HIGH (ref 70–99)
Glucose-Capillary: 99 mg/dL (ref 70–99)
Glucose-Capillary: 103 mg/dL — ABNORMAL HIGH (ref 70–99)
Glucose-Capillary: 97 mg/dL (ref 70–99)
Glucose-Capillary: 86 mg/dL (ref 70–99)
Glucose-Capillary: 88 mg/dL (ref 70–99)

## 2020-12-19 MED ORDER — ACETAMINOPHEN 10 MG/ML IV SOLN
1000.0000 mg | Freq: Once | INTRAVENOUS | Status: AC
  Administered 2020-12-19: 1000 mg via INTRAVENOUS
  Filled 2020-12-19: qty 100

## 2020-12-19 MED ORDER — ALBUTEROL SULFATE (2.5 MG/3ML) 0.083% IN NEBU
2.5000 mg | INHALATION_SOLUTION | RESPIRATORY_TRACT | Status: DC | PRN
  Administered 2020-12-25: 2.5 mg via RESPIRATORY_TRACT
  Filled 2020-12-19: qty 3

## 2020-12-19 MED ORDER — PANTOPRAZOLE SODIUM 40 MG IV SOLR
40.0000 mg | INTRAVENOUS | Status: DC
  Administered 2020-12-19 – 2020-12-22 (×4): 40 mg via INTRAVENOUS
  Filled 2020-12-19 (×4): qty 40

## 2020-12-19 MED ORDER — SODIUM CHLORIDE 0.9 % IV SOLN: INTRAVENOUS | Status: DC

## 2020-12-19 MED ORDER — LIDOCAINE 5 % EX PTCH
1.0000 | MEDICATED_PATCH | CUTANEOUS | Status: DC
  Administered 2020-12-19 – 2020-12-26 (×7): 1 via TRANSDERMAL
  Filled 2020-12-19 (×9): qty 1

## 2020-12-19 MED ORDER — ARFORMOTEROL TARTRATE 15 MCG/2ML IN NEBU
15.0000 ug | INHALATION_SOLUTION | Freq: Two times a day (BID) | RESPIRATORY_TRACT | Status: DC
  Administered 2020-12-19 – 2020-12-27 (×17): 15 ug via RESPIRATORY_TRACT
  Filled 2020-12-19 (×19): qty 2

## 2020-12-19 MED ORDER — REVEFENACIN 175 MCG/3ML IN SOLN
175.0000 ug | Freq: Every day | RESPIRATORY_TRACT | Status: DC
  Administered 2020-12-19 – 2020-12-27 (×9): 175 ug via RESPIRATORY_TRACT
  Filled 2020-12-19 (×10): qty 3

## 2020-12-19 NOTE — Progress Notes (Signed)
NAME:  Nathan Mitchell, MRN:  [id number], DOB:  12/09/1930, LOS: 3 ADMISSION DATE:  01/06/2021, CONSULTATION DATE:  12/16/2020 REFERRING MD:  Dr. Roda Shutters, CHIEF COMPLAINT:  Encephalopathy   History of Present Illness:  87 yo male presented to ED with AMS, fatigue, and poor oral intake.  Found to have dyspnea and wheezing.  He has chronic foley from recent admission, and started on keflex for UTI on 12/11/20.  CT head showed stable Lt SDH.  He was started on ABx for possible sepsis and zyprexa for delirium.  He was in hospital in June 2022 for femur fracture and large Lt SDH.  Pertinent  Medical History  Aortic Regurgitation, giant cell arteritis, optic neuropathy, COPD, A.Fib, HLD, GERD, Depression, TBI, thoracic aortic aneurysm  Significant Hospital Events:  8/16 Admitted 8/19 back to ICU, start precedex 8/20 precedex held due to lethargy  Interim History / Subjective:  Precedex held due to decreases mental status.  Objective   Blood pressure 124/74, pulse 74, temperature (!) 97.5 F (36.4 C), temperature source Axillary, resp. rate (!) 25, height 6\' 1"  (1.854 m), weight 76.1 kg, SpO2 97 %.    FiO2 (%):  [40 %] 40 %   Intake/Output Summary (Last 24 hours) at 12/19/2020 0747 Last data filed at 12/19/2020 0600 Gross per 24 hour  Intake 1739.99 ml  Output 825 ml  Net 914.99 ml    Filed Weights   12/17/20 0400 12/18/20 0359 12/19/20 0350  Weight: 76.4 kg 75.7 kg 76.1 kg   Examination:  General - somnolent Eyes - pupils reactive ENT - dry mucosa, no stridor Cardiac - regular rate/rhythm, no murmur Chest - equal breath sounds b/l, no wheezing or rales Abdomen - soft, non tender, + bowel sounds Extremities - decreased muscle bulk Skin - no rashes Neuro - opens eyes and moves extremities with stimulation, not following commands  Resolved Hospital Problem list     Assessment & Plan:   Acute metabolic encephalopathy likely from sepsis and ICU delirium. Lt SDH from June  2022. Hx of depression. - hold precedex - monitor mental status; if he remains lethargic, then repeat CT head - hold outpt lexapro  UTI with chronic foley and Pseudomonas aeruginosa in urine culture from January 06, 2021. - day 5 of cefepime - f/u blood cultures from 8/16  AKI from ATN 2nd to sepsis. Urine retention. - f/u BMET, monitor urine outpt - continue foley - hold outpt flomax while NPO  Hx of COPD. - pulmicort, brovana, yupelri - prn albuterol  Hx of A fib, nonsustained VT. Prolonged Qtc. - monitor heart rhythm  Anemia of critical illness and chronic disease. - follow up CBC - transfuse for Hb < 7  Hx of giant cell arteritis. - hold outpt methotrexate  Best Practice (right click and "Reselect all SmartList Selections" daily)  Diet/type: NPO DVT prophylaxis: Lovenox  GI prophylaxis: Protonix Code Status:  full code Last date of multidisciplinary goals of care discussion [pending]  Labs    CMP Latest Ref Rng & Units 12/19/2020 12/18/2020 12/17/2020  Glucose 70 - 99 mg/dL 12/19/2020) 086(V) 784(O)  BUN 8 - 23 mg/dL 962(X) 52(W) 41(L)  Creatinine 0.61 - 1.24 mg/dL 24(M) 0.10(U) 7.25(D)  Sodium 135 - 145 mmol/L 141 140 137  Potassium 3.5 - 5.1 mmol/L 4.5 4.5 3.9  Chloride 98 - 111 mmol/L 109 107 105  CO2 22 - 32 mmol/L 22 24 22   Calcium 8.9 - 10.3 mg/dL 6.64(Q) ) 0.3(K)  Total Protein 6.5 - 8.1  g/dL - - -  Total Bilirubin 0.3 - 1.2 mg/dL - - -  Alkaline Phos 38 - 126 U/L - - -  AST 15 - 41 U/L - - -  ALT 0 - 44 U/L - - -    CBC Latest Ref Rng & Units 12/19/2020 12/18/2020 12/17/2020  WBC 4.0 - 10.5 K/uL 9.5 14.0(H) 13.4(H)  Hemoglobin 13.0 - 17.0 g/dL 8.0(L) 8.8(L) 8.3(L)  Hematocrit 39.0 - 52.0 % 25.5(L) 28.8(L) 26.5(L)  Platelets 150 - 400 K/uL 252 295 230    ABG    Component Value Date/Time   PHART 7.403 12/16/2020 1312   PCO2ART 35.9 12/16/2020 1312   PO2ART 84.3 12/16/2020 1312   HCO3 21.6 12/16/2020 1312   TCO2 25 01-09-21 1356   ACIDBASEDEF 2.2 (H)  12/16/2020 1312   O2SAT 95.4 12/16/2020 1312    CBG (last 3)  Recent Labs    12/18/20 2319 12/19/20 0324 12/19/20 0748  GLUCAP 99 111* 99    Critical care time: 37 minutes  Coralyn Helling, MD  Pulmonary/Critical Care Pager - [phone] 12/19/2020, 8:01 AM

## 2020-12-19 NOTE — Progress Notes (Signed)
eLink Physician-Brief Progress Note Patient Name: Nathan Mitchell DOB: October 08, 1930 MRN: [id number]   Date of Service  12/19/2020  HPI/Events of Note  Agitation - Nursing request to reorder bilateral soft wrist restraints.   eICU Interventions  Will renew bilateral soft wrist restraints X 12 hours.     Intervention Category Major Interventions: Delirium, psychosis, severe agitation - evaluation and management  Tayleigh Wetherell Eugene 12/19/2020, 8:25 PM

## 2020-12-20 ENCOUNTER — Inpatient Hospital Stay (HOSPITAL_COMMUNITY): Payer: Medicare PPO

## 2020-12-20 DIAGNOSIS — N390 Urinary tract infection, site not specified: Secondary | ICD-10-CM | POA: Diagnosis not present

## 2020-12-20 DIAGNOSIS — A4152 Sepsis due to Pseudomonas: Secondary | ICD-10-CM | POA: Diagnosis not present

## 2020-12-20 DIAGNOSIS — R6520 Severe sepsis without septic shock: Secondary | ICD-10-CM | POA: Diagnosis not present

## 2020-12-20 DIAGNOSIS — G9341 Metabolic encephalopathy: Secondary | ICD-10-CM | POA: Diagnosis not present

## 2020-12-20 LAB — CULTURE, BLOOD (ROUTINE X 2)
Culture: NO GROWTH
Culture: NO GROWTH
Special Requests: ADEQUATE

## 2020-12-20 LAB — GLUCOSE, CAPILLARY
Glucose-Capillary: 90 mg/dL (ref 70–99)
Glucose-Capillary: 80 mg/dL (ref 70–99)
Glucose-Capillary: 82 mg/dL (ref 70–99)
Glucose-Capillary: 81 mg/dL (ref 70–99)
Glucose-Capillary: 83 mg/dL (ref 70–99)

## 2020-12-20 LAB — CBC
HCT: 27.5 % — ABNORMAL LOW (ref 39.0–52.0)
Hemoglobin: 8.4 g/dL — ABNORMAL LOW (ref 13.0–17.0)
MCH: 27.4 pg (ref 26.0–34.0)
MCHC: 30.5 g/dL (ref 30.0–36.0)
MCV: 89.6 fL (ref 80.0–100.0)
Platelets: 279 10*3/uL (ref 150–400)
RBC: 3.07 MIL/uL — ABNORMAL LOW (ref 4.22–5.81)
RDW: 17.9 % — ABNORMAL HIGH (ref 11.5–15.5)
WBC: 17.2 10*3/uL — ABNORMAL HIGH (ref 4.0–10.5)
nRBC: 0 % (ref 0.0–0.2)

## 2020-12-20 LAB — MAGNESIUM: Magnesium: 2 mg/dL (ref 1.7–2.4)

## 2020-12-20 LAB — BASIC METABOLIC PANEL
Anion gap: 9 (ref 5–15)
BUN: 42 mg/dL — ABNORMAL HIGH (ref 8–23)
CO2: 21 mmol/L — ABNORMAL LOW (ref 22–32)
Calcium: 8.5 mg/dL — ABNORMAL LOW (ref 8.9–10.3)
Chloride: 114 mmol/L — ABNORMAL HIGH (ref 98–111)
Creatinine, Ser: 1.86 mg/dL — ABNORMAL HIGH (ref 0.61–1.24)
GFR, Estimated: 34 mL/min — ABNORMAL LOW (ref >60)
Glucose, Bld: 101 mg/dL — ABNORMAL HIGH (ref 70–99)
Potassium: 4.2 mmol/L (ref 3.5–5.1)
Sodium: 144 mmol/L (ref 135–145)

## 2020-12-20 MED ORDER — MORPHINE SULFATE (PF) 2 MG/ML IV SOLN
0.5000 mg | INTRAVENOUS | Status: DC | PRN
Start: 2020-12-20 — End: 2020-12-24
  Administered 2020-12-20 – 2020-12-23 (×6): 0.5 mg via INTRAVENOUS
  Filled 2020-12-20 (×6): qty 1

## 2020-12-20 MED ORDER — METOPROLOL TARTRATE 5 MG/5ML IV SOLN
2.5000 mg | Freq: Four times a day (QID) | INTRAVENOUS | Status: DC | PRN
  Filled 2020-12-20: qty 5

## 2020-12-20 MED ORDER — PIPERACILLIN-TAZOBACTAM 3.375 G IVPB
3.3750 g | Freq: Three times a day (TID) | INTRAVENOUS | Status: AC
  Administered 2020-12-20 – 2020-12-21 (×5): 3.375 g via INTRAVENOUS
  Filled 2020-12-20 (×5): qty 50

## 2020-12-20 MED ORDER — LORAZEPAM 2 MG/ML IJ SOLN
0.5000 mg | Freq: Once | INTRAMUSCULAR | Status: AC
  Administered 2020-12-20: 0.5 mg via INTRAVENOUS
  Filled 2020-12-20: qty 1

## 2020-12-20 MED ORDER — ENOXAPARIN SODIUM 30 MG/0.3ML IJ SOSY
30.0000 mg | PREFILLED_SYRINGE | INTRAMUSCULAR | Status: DC
  Administered 2020-12-20 – 2020-12-23 (×4): 30 mg via SUBCUTANEOUS
  Filled 2020-12-20 (×4): qty 0.3

## 2020-12-20 MED ORDER — METHYLPREDNISOLONE SODIUM SUCC 125 MG IJ SOLR
40.0000 mg | Freq: Two times a day (BID) | INTRAMUSCULAR | Status: DC
  Administered 2020-12-20 – 2020-12-22 (×4): 40 mg via INTRAVENOUS
  Filled 2020-12-20 (×4): qty 2

## 2020-12-20 NOTE — Progress Notes (Signed)
eLink Physician-Brief Progress Note Patient Name: Nathan Mitchell DOB: 1931/02/19 MRN: [id number]   Date of Service  12/20/2020  HPI/Events of Note  Agitation - Nursing request for 1:1 safety sitter.   eICU Interventions  Will order 1:1 safety sitter X 12 hours.      Intervention Category Major Interventions: Delirium, psychosis, severe agitation - evaluation and management  Ronne Savoia Eugene 12/20/2020, 1:42 AM

## 2020-12-20 NOTE — Progress Notes (Signed)
Pharmacy Antibiotic Note  Nathan Mitchell is a 87 y.o. male admitted on January 14, 2021 with sepsis with suspected urinary source. 8/18 patient is afebrile, tachycardic (HR 100s), leukocytosis is improving, however WBC still elevated @ 13.4. Pt also has altered mental status which doesn't seem to be present at baseline, EEG showing moderate diffuse encephalopathy of unknown etiology, no seizures/epileptiform discharges noted.   Patient has been on cefepime for a Pan susceptible pseudomonas complicated UTI.  MD concerned that Cefepime is contributing to his encephalopathy. Pharmacy asked to switch to Zosyn.  CrCL 29 ml/min      Plan: STOP Cefepime Start Zosyn 3.375 gm IV q8hr Monitor renal function and F/u LOT  Height: 6\' 1"  (185.4 cm) Weight: 74.2 kg (163 lb 9.3 oz) IBW/kg (Calculated) : 79.9  Temp (24hrs), Avg:98.3 F (36.8 C), Min:97.3 F (36.3 C), Max:99.5 F (37.5 C)  Recent Labs  Lab 01/14/2021 1434 2021/01/14 1630 12/16/20 0231 12/17/20 0153 12/18/20 0205 12/19/20 0031 12/20/20 0343  WBC  --   --  14.7* 13.4* 14.0* 9.5 17.2*  CREATININE  --   --  1.63* 1.65* 1.79* 1.87* 1.86*  LATICACIDVEN 1.1 1.5  --   --   --   --   --      Estimated Creatinine Clearance: 28.8 mL/min (A) (by C-G formula based on SCr of 1.86 mg/dL (H)).    Allergies  Allergen Reactions   Nsaids     Due to afib   Tiotropium Bromide Monohydrate     Other reaction(s): ineffecticve   Umeclidinium-Vilanterol     Other reaction(s): ineffective    Antimicrobials this admission: Vanc 8/16 >> 8/18 Flagyl 8/16>>8/18  8/16 Cefepime >>8/21 8/21 Zosyn >>  Dose adjustments this admission: N/A  Microbiology results: 8/16 BCx: ngtd  8/16 UCx: > 100,000 colonies GNRs (Pseudomonas aeroginosa)    9/16, PharmD, [facility] Clinical Pharmacist Please see AMION for all Pharmacists' Contact Phone Numbers 12/20/2020, 1:42 PM

## 2020-12-20 NOTE — Progress Notes (Signed)
NAME:  Nathan Mitchell, MRN:  [id number], DOB:  [birth year], LOS: 4 ADMISSION DATE:  12/01/2020, CONSULTATION DATE:  12/16/2020 REFERRING MD:  Dr. Roda Shutters, CHIEF COMPLAINT:  Encephalopathy   History of Present Illness:  87 yo male presented to ED with AMS, fatigue, and poor oral intake.  Found to have dyspnea and wheezing.  He has chronic foley from recent admission, and started on keflex for UTI on 12/11/20.  CT head showed stable Lt SDH.  He was started on ABx for possible sepsis and zyprexa for delirium.  He was in hospital in June 2022 for femur fracture and large Lt SDH.  Pertinent  Medical History  Aortic Regurgitation, giant cell arteritis, optic neuropathy, COPD, A.Fib, HLD, GERD, Depression, TBI, thoracic aortic aneurysm  Significant Hospital Events:  8/16 Admitted 8/19 back to ICU, start precedex 8/20 precedex held due to lethargy 8/21 progressive agitation >> precedex restarted.  Interim History / Subjective:  Was off precedex yesterday afternoon and mental status better.  During the course of last evening and this morning he has become more confused again.  Objective   Blood pressure (!) 138/96, pulse (!) 125, temperature 98.8 F (37.1 C), temperature source Axillary, resp. rate 19, height 6\' 1"  (1.854 m), weight 74.2 kg, SpO2 92 %.        Intake/Output Summary (Last 24 hours) at 12/20/2020 1348 Last data filed at 12/20/2020 1300 Gross per 24 hour  Intake 1642.07 ml  Output 1000 ml  Net 642.07 ml   Filed Weights   12/18/20 0359 12/19/20 0350 12/20/20 0500  Weight: 75.7 kg 76.1 kg 74.2 kg   Examination:  General - wiggly Eyes - pupils reactive ENT - no sinus tenderness, no stridor Cardiac - regular rate/rhythm, no murmur Chest - equal breath sounds b/l, no wheezing or rales Abdomen - soft, non tender, + bowel sounds Extremities - no cyanosis, clubbing, or edema Skin - no rashes Neuro - not following commands   Resolved Hospital Problem list     Assessment &  Plan:   Acute metabolic encephalopathy likely from sepsis and ICU delirium. Lt SDH from June 2022. Hx of depression. - resume precedex 8/21 for goal RASS 0 to -1 - agree with plan to change from cefepime to alternative antibiotic - hold outpt lexapro  UTI with chronic foley and Pseudomonas aeruginosa in urine culture from 12/14/2020. - day 6 of Abx, changed to zosyn on 8/21  AKI from ATN 2nd to sepsis. Urine retention. - f/u BMET, monitor urine outpt - continue foley - hold outpt flomax while NPO  Hx of COPD. - pulmicort, brovana, yupelri - prn albuterol  Hx of A fib, nonsustained VT. Prolonged Qtc. - monitor heart rhythm  Anemia of critical illness and chronic disease. - f/u CBC - transfuse for Hb < 7  Hx of giant cell arteritis. - hold outpt methotrexate - will add solumedrol  Dysphagia. - f/u with speech therapy, but mental status remains barrier to his swallowing - might need cortrak; could then add oral medications for delirium  Best Practice (right click and "Reselect all SmartList Selections" daily)  Diet/type: NPO DVT prophylaxis: Lovenox  GI prophylaxis: Protonix Code Status:  full code Last date of multidisciplinary goals of care discussion [pending]  Labs    CMP Latest Ref Rng & Units 12/20/2020 12/19/2020 12/18/2020  Glucose 70 - 99 mg/dL 12/20/2020) 371(I) 967(E)  BUN 8 - 23 mg/dL 938(B) 01(B) 51(W)  Creatinine 0.61 - 1.24 mg/dL 25(E) 5.27(P) 8.24(M)  Sodium  135 - 145 mmol/L 144 141 140  Potassium 3.5 - 5.1 mmol/L 4.2 4.5 4.5  Chloride 98 - 111 mmol/L 114(H) 109 107  CO2 22 - 32 mmol/L 21(L) 22 24  Calcium 8.9 - 10.3 mg/dL 8.9(F) 8.1(O) 1.7(P)  Total Protein 6.5 - 8.1 g/dL - - -  Total Bilirubin 0.3 - 1.2 mg/dL - - -  Alkaline Phos 38 - 126 U/L - - -  AST 15 - 41 U/L - - -  ALT 0 - 44 U/L - - -    CBC Latest Ref Rng & Units 12/20/2020 12/19/2020 12/18/2020  WBC 4.0 - 10.5 K/uL 17.2(H) 9.5 14.0(H)  Hemoglobin 13.0 - 17.0 g/dL 1.0(C) 5.8(N) 2.7(P)   Hematocrit 39.0 - 52.0 % 27.5(L) 25.5(L) 28.8(L)  Platelets 150 - 400 K/uL 279 252 295    ABG    Component Value Date/Time   PHART 7.403 12/16/2020 1312   PCO2ART 35.9 12/16/2020 1312   PO2ART 84.3 12/16/2020 1312   HCO3 21.6 12/16/2020 1312   TCO2 25 01/05/21 1356   ACIDBASEDEF 2.2 (H) 12/16/2020 1312   O2SAT 95.4 12/16/2020 1312    CBG (last 3)  Recent Labs    12/20/20 0319 12/20/20 0732 12/20/20 1121  GLUCAP 90 80 82    Critical care time: 33 minutes  Coralyn Helling, MD [facility] Pager - [phone] 12/20/2020, 1:48 PM

## 2020-12-20 NOTE — Evaluation (Signed)
Clinical/Bedside Swallow Evaluation Patient Details  Name: Nathan Mitchell MRN: [id number] Date of Birth: Nov 16, 1930  Today's Date: 12/20/2020 Time: SLP Start Time (ACUTE ONLY): 0947 SLP Stop Time (ACUTE ONLY): 1003 SLP Time Calculation (min) (ACUTE ONLY): 16 min  Past Medical History:  Past Medical History:  Diagnosis Date   Aortic atherosclerosis (HCC)    Atrial fibrillation (HCC)    Giant cell arteritis (HCC)    Osteopenia    Pure hypercholesterolemia    Thoracic aortic aneurysm (HCC)    Thrombophilia (HCC)    Past Surgical History:  Past Surgical History:  Procedure Laterality Date   APPENDECTOMY     CRANIOTOMY Left 10/08/2020   Procedure: LEFT BURR HOLE DRAINAGE OF SUBDURAL HEMATOMA;  Surgeon: Jadene Pierini, MD;  Location: [facility] OR;  Service: Neurosurgery;  Laterality: Left;   HERNIA REPAIR     INTRAMEDULLARY (IM) NAIL INTERTROCHANTERIC Right 10/08/2020   Procedure: RIGHT INTRAMEDULLARY (IM) NAIL INTERTROCHANTRIC WITH PREVENA WOUND VAC APPLICATION;  Surgeon: Tarry Kos, MD;  Location: [facility] OR;  Service: Orthopedics;  Laterality: Right;   HPI:  Pt is an 87 y/o male who presented to the ED with AMS, fatigue, and poor oral intake.  Found to have dyspnea and wheezing.  He has chronic foley from recent admission, and started on keflex for UTI on 8/12.  CT head showed stable Lt SDH.  CXR 8/17: No  acute findings. Dx possible sepsis, acute metabolic encephalopathy thought to be from sepsis and ICU delirium. PMH: Aortic Regurgitation, giant cell arteritis, optic neuropathy, COPD, A.Fib, HLD, GERD, Depression, TBI, thoracic aortic aneurysm.   Assessment / Plan / Recommendation Clinical Impression  Pt was seen for bedside swallow evaluation. Pt was confused, made multiple attempts to exit the bed, frequently moved within in the bed, and frequently produced vocalizations (short productions of phoneme /a/) with a rough vocal quality, but no verbalization was noted. Pt was unable to  participate in a complete oral mechanism exam due to his difficulty following commands, but he presented with a dry oral mucosa and adequate, natural dentition. Pt demonstrated reduced bolus awareness, and intermittently required cues for bolus manipulation. Regular texture solids were spat out despite verbal prompts. Pt's rough vocalizations were consistently noted following p.o. intake and delayed throat clearing and coughing were noted following intake of consecutive swallows of thin liquids at the end of the evaluation. Pt's NPO status will be maintained at this time with allowance of ice chips and small sips of water following oral care. SLP will follow to assess improvement in swallow function. SLP Visit Diagnosis: Dysphagia, unspecified (R13.10)    Aspiration Risk  Moderate aspiration risk    Diet Recommendation NPO;Ice chips PRN after oral care   Liquid Administration via: Straw;Cup;Spoon Medication Administration: Via alternative means (RN reported that alol meds have been changed to IV) Supervision: Staff to assist with self feeding Compensations: Minimize environmental distractions Postural Changes: Seated upright at 90 degrees    Other  Recommendations Oral Care Recommendations: Oral care QID   Follow up Recommendations  (TBD)      Frequency and Duration min 2x/week  2 weeks       Prognosis Prognosis for Safe Diet Advancement: Good Barriers to Reach Goals: Cognitive deficits;Severity of deficits      Swallow Study   General Date of Onset: 12/19/20 HPI: Pt is an 87 y/o male who presented to the ED with AMS, fatigue, and poor oral intake.  Found to have dyspnea and wheezing.  He  has chronic foley from recent admission, and started on keflex for UTI on 8/12.  CT head showed stable Lt SDH.  CXR 8/17: No  acute findings. Dx possible sepsis, acute metabolic encephalopathy thought to be from sepsis and ICU delirium. PMH: Aortic Regurgitation, giant cell arteritis, optic  neuropathy, COPD, A.Fib, HLD, GERD, Depression, TBI, thoracic aortic aneurysm. Type of Study: Bedside Swallow Evaluation Previous Swallow Assessment: none Diet Prior to this Study: NPO Temperature Spikes Noted: No Respiratory Status: Nasal cannula History of Recent Intubation: No Behavior/Cognition: Alert;Doesn't follow directions;Confused;Impulsive Oral Cavity Assessment: Within Functional Limits Oral Care Completed by SLP: No (pt inadequately cooperative) Self-Feeding Abilities: Total assist Patient Positioning: Upright in bed;Postural control adequate for testing Baseline Vocal Quality:  (rough) Volitional Cough: Cognitively unable to elicit Volitional Swallow: Unable to elicit    Oral/Motor/Sensory Function Overall Oral Motor/Sensory Function:  (UTA)   Ice Chips Ice chips: Impaired Presentation: Spoon Oral Phase Impairments: Poor awareness of bolus   Thin Liquid Thin Liquid: Impaired Presentation: Straw Oral Phase Impairments: Poor awareness of bolus Pharyngeal  Phase Impairments: Cough - Delayed;Throat Clearing - Delayed    Nectar Thick Nectar Thick Liquid: Not tested   Honey Thick Honey Thick Liquid: Not tested   Puree Puree: Impaired Presentation: Spoon Oral Phase Impairments: Poor awareness of bolus   Solid     Solid: Not tested     Nathan Tant I. Vear Clock, MS, CCC-SLP Acute Rehabilitation Services Office number [phone] Pager [phone]  Nathan Mitchell 12/20/2020,10:20 AM

## 2020-12-20 NOTE — Progress Notes (Addendum)
Consider  [facility] PROGRESS NOTE   [name] ZOX:[id number] DOB: [birth year] DOA: 01-05-21  PCP: Merlene Laughter, MD  Brief History/Interval Summary: 87 year old male presented to ED 8/16 with altered mental status, decreased oral intake, and generalized fatigue. On arrival with tachycardia, respiratory distress with expiratory wheezes. Family reports over last 24 hours he has become progressively confused and combative. CT head with stable left SDH. CXR with cardiomegaly with chronic interstitial thickening. Sodium 129. Crt 1.79. COVID negative. WBC 14.7. Started on Vancomycin/Cefepime. Throughout the night with continued confusion. Given Zyprexa 2.5 mg x 1.  Subsequently had to be transferred to the ICU.  Was placed on Precedex.  Remains confused.  Precedex was taken off yesterday morning.  Transferred back to [facility].  Of note patient was admitted 6/9 s/p fall found to have fractured femur and large left SDH, underwent IM nail for femur fracture, discharged with foley catheter.     Consultants: Critical care medicine  Procedures: None yet  Antibiotics: Anti-infectives (From admission, onward)    Start     Dose/Rate Route Frequency Ordered Stop   01/08/21 0600  ceFAZolin (ANCEF) IVPB 2g/100 mL premix  Status:  Discontinued        2 g 200 mL/hr over 30 Minutes Intravenous  Once 12/16/20 1948 12/16/20 1959   12/17/20 1500  vancomycin (VANCOREADY) IVPB 1750 mg/350 mL  Status:  Discontinued        1,750 mg 175 mL/hr over 120 Minutes Intravenous Every 48 hours January 05, 2021 1539 12/17/20 1024   12/17/20 1400  cefTRIAXone (ROCEPHIN) 2 g in sodium chloride 0.9 % 100 mL IVPB  Status:  Discontinued        2 g 200 mL/hr over 30 Minutes Intravenous Every 24 hours 12/17/20 1024 12/17/20 1226   12/17/20 1400  ceFEPIme (MAXIPIME) 2 g in sodium chloride 0.9 % 100 mL IVPB        2 g 200 mL/hr over 30 Minutes Intravenous Every 12 hours 12/17/20 1226     12/16/20 2030   ceFAZolin (ANCEF) IVPB 2g/100 mL premix  Status:  Discontinued        2 g 200 mL/hr over 30 Minutes Intravenous  Once 12/16/20 1943 12/16/20 1948   12/16/20 0200  ceFEPIme (MAXIPIME) 2 g in sodium chloride 0.9 % 100 mL IVPB  Status:  Discontinued        2 g 200 mL/hr over 30 Minutes Intravenous Every 12 hours 2021/01/05 1539 12/17/20 1024   01-05-2021 2200  metroNIDAZOLE (FLAGYL) IVPB 500 mg  Status:  Discontinued        500 mg 100 mL/hr over 60 Minutes Intravenous Every 8 hours 01-05-2021 1953 12/17/20 1024   January 05, 2021 1415  ceFEPIme (MAXIPIME) 2 g in sodium chloride 0.9 % 100 mL IVPB        2 g 200 mL/hr over 30 Minutes Intravenous  Once Jan 05, 2021 1402 01/05/21 1528   January 05, 2021 1415  metroNIDAZOLE (FLAGYL) IVPB 500 mg        500 mg 100 mL/hr over 60 Minutes Intravenous  Once 05-Jan-2021 1402 01/05/2021 1647   05-Jan-2021 1415  vancomycin (VANCOREADY) IVPB 1500 mg/300 mL        1,500 mg 150 mL/hr over 120 Minutes Intravenous  Once 2021-01-05 1402 January 05, 2021 1900       Subjective/Interval History: Patient remains confused and agitated.  Discussed with nursing staff.  Moving all of his extremities.  No obvious facial asymmetry is noted.  Patient unable to communicate due  to his agitation.     Assessment/Plan:  Acute metabolic encephalopathy Most likely due to sepsis from UTI, ICU delirium CT head done at the time of admission did not show any acute findings.  Stable subdural hematoma was noted.  Does not appear to have any focal neurological deficits.  No reason to repeat another CT at this time. Patient did require Precedex in the ICU.  Taken off of Precedex yesterday morning.  Remains delirious.  Due to prolonged QT interval we have limitations medicines that we can use. EEG was done which did not show any epileptiform activity. Patient reevaluated this afternoon.  Remains quite encephalopathic more agitated than this morning.  Discussed with CCM.  We will place him back on Precedex.  I feel that  cefepime could be primarily responsible for his encephalopathy more so than other factors.  We will change him to Pip/tazo for now.  Aspiration pneumonia Noted to be on oxygen.  Nursing staff as noted that he has been coughing more than usual.  Chest x-ray this morning does suggest a worsening opacity in the right lung base.  Patient does have chronic interstitial prominence.  It is quite likely he may have aspirated.  Patient noted to be on cefepime which will be continued.  Aspiration precautions.  Speech therapy to see.  Pseudomonas UTI with sepsis present on admission Likely in setting of Foley catheter that was placed in June after he sustained a left femur fracture.  It looks like Foley catheter was replaced on 8/17. Continue cefepime for now.  Blood cultures are negative so far.  Pain Patient apparently experiencing pain based on the fact that he is grimacing and crying out.  Source of pain has been difficult to identify.  His WBC is noted to be high today compared to yesterday though he has been afebrile.  We will do an abdominal film just to make sure there is nothing concerning in that region.  Continue to monitor. Recent CT scan done on 8/19 did not show any concerning findings.  Leukocytosis Sudden increase in WBC is noted.  Could be due to aspiration.  Noted to be afebrile.  Continue to monitor.  Acute kidney injury Renal function has been stable for the last several days.  Monitor urine output.  Holding Flomax. CT renal study showed left hydronephrosis with no obstructing stone was noted.  History of COPD Home medications being continued.  No active wheezing noted on examination.  History of atrial fibrillation/nonsustained ventricular tachycardia/prolonged QT interval Monitor on telemetry.  Not on anticoagulation due to recent subdural hematoma.  Normocytic anemia No evidence of overt bleeding.  Continue to monitor hemoglobin.  History of giant cell arteritis Holding  methotrexate.     DVT Prophylaxis: Lovenox Code Status: Full code Family Communication: No family at bedside Disposition Plan: Unclear for now  Status is: Inpatient  Remains inpatient appropriate because:Altered mental status, IV treatments appropriate due to intensity of illness or inability to take PO, and Inpatient level of care appropriate due to severity of illness  Dispo: The patient is from: Home              Anticipated d/c is to:  To be determined              Patient currently is not medically stable to d/c.   Difficult to place patient No        Medications: Scheduled:  arformoterol  15 mcg Nebulization BID   budesonide (PULMICORT) nebulizer solution  0.5  mg Nebulization BID   Chlorhexidine Gluconate Cloth  6 each Topical Daily   enoxaparin (LOVENOX) injection  40 mg Subcutaneous Q24H   insulin aspart  0-9 Units Subcutaneous Q4H   lidocaine  1 patch Transdermal Q24H   mouth rinse  15 mL Mouth Rinse BID   pantoprazole (PROTONIX) IV  40 mg Intravenous Q24H   revefenacin  175 mcg Nebulization Daily   sodium chloride flush  3 mL Intravenous Q12H   Continuous:  sodium chloride 50 mL/hr at 12/20/20 0900   ceFEPime (MAXIPIME) IV Stopped (12/20/20 0234)   dexmedetomidine (PRECEDEX) IV infusion Stopped (12/20/20 0501)   PNT:[id number] **OR** acetaminophen, albuterol, morphine injection   Objective:  Vital Signs  Vitals:   12/20/20 0726 12/20/20 0800 12/20/20 0816 12/20/20 0900  BP:   (!) 146/91 (!) 123/108  Pulse:  (!) 133 (!) 129 (!) 137  Resp:  (!) 32 (!) 24 (!) 28  Temp: 99 F (37.2 C)     TempSrc: Axillary     SpO2:  94% 94% 96%  Weight:      Height:        Intake/Output Summary (Last 24 hours) at 12/20/2020 0945 Last data filed at 12/20/2020 0900 Gross per 24 hour  Intake 1620.95 ml  Output 1000 ml  Net 620.95 ml   Filed Weights   12/18/20 0359 12/19/20 0350 12/20/20 0500  Weight: 75.7 kg 76.1 kg 74.2 kg    General appearance: Patient  is awake.  Eyes open.  Delirious. Resp: Noted to be tachypneic with no use of accessory muscles.  Coarse breath sounds bilaterally with crackles right base. Cardio: S1-S2 is tachycardic regular.  No S3-S4.  No rubs murmurs or bruit GI: Abdomen is soft.  Difficult to appreciate if he is tender although he seems to be wincing when pushed in various regions of the abdomen.  Bowel sounds present.  No obvious masses organomegaly.   Extremities: No edema.  noted to be All of his extremities  Neurologic: He is delirious.  No obvious focal neurological deficits noted.    Lab Results:  Data Reviewed: I have personally reviewed following labs and imaging studies  CBC: Recent Labs  Lab 12/21/2020 1425 12/16/20 0231 12/17/20 0153 12/18/20 0205 12/19/20 0031 12/20/20 0343  WBC 16.9* 14.7* 13.4* 14.0* 9.5 17.2*  NEUTROABS 15.0*  --   --   --   --   --   HGB 8.9* 8.2* 8.3* 8.8* 8.0* 8.4*  HCT 28.0* 27.1* 26.5* 28.8* 25.5* 27.5*  MCV 87.0 90.0 87.7 88.3 88.5 89.6  PLT 264 236 230 295 252 279    Basic Metabolic Panel: Recent Labs  Lab 12/16/20 0231 12/17/20 0153 12/18/20 0205 12/19/20 0031 12/20/20 0343  NA 133* 137 140 141 144  K 4.2 3.9 4.5 4.5 4.2  CL 102 105 107 109 114*  CO2 21* 22 24 22  21*  GLUCOSE 106* 127* 107* 109* 101*  BUN 27* 33* 45* 46* 42*  CREATININE 1.63* 1.65* 1.79* 1.87* 1.86*  CALCIUM 8.2* 8.5* 8.7* 8.6* 8.5*  MG 1.8 2.0 2.2  --  2.0  PHOS  --  4.6  --   --   --     GFR: Estimated Creatinine Clearance: 28.8 mL/min (A) (by C-G formula based on SCr of 1.86 mg/dL (H)).  Liver Function Tests: Recent Labs  Lab 12/16/2020 1425 12/16/20 0231  AST 13* 14*  ALT 13 13  ALKPHOS 61 53  BILITOT 1.0 1.0  PROT 6.1* 5.9*  ALBUMIN 2.5*  2.5*     Recent Labs  Lab 12/16/20 1624  AMMONIA 29    Coagulation Profile: Recent Labs  Lab 05-15-20 1425 12/16/20 0231  INR 1.3* 1.3*     CBG: Recent Labs  Lab 12/19/20 1525 12/19/20 1919 12/19/20 2321 12/20/20 0319  12/20/20 0732  GLUCAP 97 86 88 90 80     Recent Results (from the past 240 hour(s))  Resp Panel by RT-PCR (Flu A&B, Covid) Nasopharyngeal Swab     Status: None   Collection Time: 12/11/20  2:11 PM   Specimen: Nasopharyngeal Swab; Nasopharyngeal(NP) swabs in vial transport medium  Result Value Ref Range Status   SARS Coronavirus 2 by RT PCR NEGATIVE NEGATIVE Final    Comment: (NOTE) SARS-CoV-2 target nucleic acids are NOT DETECTED.  The SARS-CoV-2 RNA is generally detectable in upper respiratory specimens during the acute phase of infection. The lowest concentration of SARS-CoV-2 viral copies this assay can detect is 138 copies/mL. A negative result does not preclude SARS-Cov-2 infection and should not be used as the sole basis for treatment or other patient management decisions. A negative result may occur with  improper specimen collection/handling, submission of specimen other than nasopharyngeal swab, presence of viral mutation(s) within the areas targeted by this assay, and inadequate number of viral copies(<138 copies/mL). A negative result must be combined with clinical observations, patient history, and epidemiological information. The expected result is Negative.  Fact Sheet for Patients:  BloggerCourse.comhttps://www.fda.gov/media/152166/download  Fact Sheet for Healthcare Providers:  SeriousBroker.ithttps://www.fda.gov/media/152162/download  This test is no t yet approved or cleared by the [place] FDA and  has been authorized for detection and/or diagnosis of SARS-CoV-2 by FDA under an Emergency Use Authorization (EUA). This EUA will remain  in effect (meaning this test can be used) for the duration of the COVID-19 declaration under Section 564(b)(1) of the Act, 21 U.S.C.section 360bbb-3(b)(1), unless the authorization is terminated  or revoked sooner.       Influenza A by PCR NEGATIVE NEGATIVE Final   Influenza B by PCR NEGATIVE NEGATIVE Final    Comment: (NOTE) The Xpert Xpress  SARS-CoV-2/FLU/RSV plus assay is intended as an aid in the diagnosis of influenza from Nasopharyngeal swab specimens and should not be used as a sole basis for treatment. Nasal washings and aspirates are unacceptable for Xpert Xpress SARS-CoV-2/FLU/RSV testing.  Fact Sheet for Patients: BloggerCourse.comhttps://www.fda.gov/media/152166/download  Fact Sheet for Healthcare Providers: SeriousBroker.ithttps://www.fda.gov/media/152162/download  This test is not yet approved or cleared by the [place] FDA and has been authorized for detection and/or diagnosis of SARS-CoV-2 by FDA under an Emergency Use Authorization (EUA). This EUA will remain in effect (meaning this test can be used) for the duration of the COVID-19 declaration under Section 564(b)(1) of the Act, 21 U.S.C. section 360bbb-3(b)(1), unless the authorization is terminated or revoked.  Performed at [facility], [address], [place], [place] [address]   Resp Panel by RT-PCR (Flu A&B, Covid) Nasopharyngeal Swab     Status: None   Collection Time: 05-15-20  2:02 PM   Specimen: Nasopharyngeal Swab; Nasopharyngeal(NP) swabs in vial transport medium  Result Value Ref Range Status   SARS Coronavirus 2 by RT PCR NEGATIVE NEGATIVE Final    Comment: (NOTE) SARS-CoV-2 target nucleic acids are NOT DETECTED.  The SARS-CoV-2 RNA is generally detectable in upper respiratory specimens during the acute phase of infection. The lowest concentration of SARS-CoV-2 viral copies this assay can detect is 138 copies/mL. A negative result does not preclude SARS-Cov-2 infection and should not  be used as the sole basis for treatment or other patient management decisions. A negative result may occur with  improper specimen collection/handling, submission of specimen other than nasopharyngeal swab, presence of viral mutation(s) within the areas targeted by this assay, and inadequate number of viral copies(<138 copies/mL). A negative result must be  combined with clinical observations, patient history, and epidemiological information. The expected result is Negative.  Fact Sheet for Patients:  BloggerCourse.com  Fact Sheet for Healthcare Providers:  SeriousBroker.it  This test is no t yet approved or cleared by the [place] FDA and  has been authorized for detection and/or diagnosis of SARS-CoV-2 by FDA under an Emergency Use Authorization (EUA). This EUA will remain  in effect (meaning this test can be used) for the duration of the COVID-19 declaration under Section 564(b)(1) of the Act, 21 U.S.C.section 360bbb-3(b)(1), unless the authorization is terminated  or revoked sooner.       Influenza A by PCR NEGATIVE NEGATIVE Final   Influenza B by PCR NEGATIVE NEGATIVE Final    Comment: (NOTE) The Xpert Xpress SARS-CoV-2/FLU/RSV plus assay is intended as an aid in the diagnosis of influenza from Nasopharyngeal swab specimens and should not be used as a sole basis for treatment. Nasal washings and aspirates are unacceptable for Xpert Xpress SARS-CoV-2/FLU/RSV testing.  Fact Sheet for Patients: BloggerCourse.com  Fact Sheet for Healthcare Providers: SeriousBroker.it  This test is not yet approved or cleared by the [place] FDA and has been authorized for detection and/or diagnosis of SARS-CoV-2 by FDA under an Emergency Use Authorization (EUA). This EUA will remain in effect (meaning this test can be used) for the duration of the COVID-19 declaration under Section 564(b)(1) of the Act, 21 U.S.C. section 360bbb-3(b)(1), unless the authorization is terminated or revoked.  Performed at [facility], [address] [place], [place] [address]   Urine Culture     Status: Abnormal   Collection Time: 11/30/2020  2:02 PM   Specimen: In/Out Cath Urine  Result Value Ref Range Status   Specimen Description IN/OUT CATH  URINE  Final   Special Requests   Final    NONE Performed at [facility], [address] [place], [place] [address]    Culture >=100,000 COLONIES/mL PSEUDOMONAS AERUGINOSA (A)  Final   Report Status 12/18/2020 FINAL  Final   Organism ID, Bacteria PSEUDOMONAS AERUGINOSA (A)  Final      Susceptibility   Pseudomonas aeruginosa - MIC*    CEFTAZIDIME 4 SENSITIVE Sensitive     CIPROFLOXACIN <=0.25 SENSITIVE Sensitive     GENTAMICIN 4 SENSITIVE Sensitive     IMIPENEM 2 SENSITIVE Sensitive     PIP/TAZO 8 SENSITIVE Sensitive     CEFEPIME 4 SENSITIVE Sensitive     * >=100,000 COLONIES/mL PSEUDOMONAS AERUGINOSA  Blood Culture (routine x 2)     Status: None (Preliminary result)   Collection Time: 12/16/2020  2:45 PM   Specimen: BLOOD  Result Value Ref Range Status   Specimen Description BLOOD SITE NOT SPECIFIED  Final   Special Requests   Final    BOTTLES DRAWN AEROBIC AND ANAEROBIC Blood Culture results may not be optimal due to an inadequate volume of blood received in culture bottles   Culture   Final    NO GROWTH 4 DAYS Performed at [facility], [address] [place], [place] [address]    Report Status PENDING  Incomplete  Blood Culture (routine x 2)     Status:  None (Preliminary result)   Collection Time: 2020/12/19  2:50 PM   Specimen: BLOOD  Result Value Ref Range Status   Specimen Description BLOOD SITE NOT SPECIFIED  Final   Special Requests   Final    BOTTLES DRAWN AEROBIC AND ANAEROBIC Blood Culture adequate volume   Culture   Final    NO GROWTH 4 DAYS Performed at [facility], [address] [place], [place] [address]    Report Status PENDING  Incomplete  MRSA Next Gen by PCR, Nasal     Status: None   Collection Time: 12/16/20  4:43 PM   Specimen: Nasal Mucosa; Nasal Swab  Result Value Ref Range Status   MRSA by PCR Next Gen NOT DETECTED NOT DETECTED Final    Comment: (NOTE) The GeneXpert MRSA Assay (FDA approved for NASAL specimens only), is one  component of a comprehensive MRSA colonization surveillance program. It is not intended to diagnose MRSA infection nor to guide or monitor treatment for MRSA infections. Test performance is not FDA approved in patients less than 79 years old. Performed at [facility], [address], [place], [place] [address]       Radiology Studies: CT RENAL STONE STUDY  Result Date: 12/18/2020 CLINICAL DATA:  UTI and hydronephrosis. EXAM: CT ABDOMEN AND PELVIS WITHOUT CONTRAST TECHNIQUE: Multidetector CT imaging of the abdomen and pelvis was performed following the standard protocol without IV contrast. COMPARISON:  09/05/2020 FINDINGS: Lower chest: Enlarged heart. Moderate bilateral pleural effusions, with associated atelectasis. Ground-glass opacities in the right middle, right lower, and left lower lobes. Bronchial wall thickening. Hepatobiliary: No focal liver abnormality is seen. No gallstones, gallbladder wall thickening, or biliary dilatation. Pancreas: Unremarkable. No pancreatic ductal dilatation or surrounding inflammatory changes. Spleen: Normal in size without focal abnormality. Adrenals/Urinary Tract: Stable low-density right adrenal nodule. Normal left adrenal gland. The right kidney is normal in contour with no hydronephrosis or nephrolithiasis. The left kidney demonstrates mild hydronephrosis and mild ureterectasis. No stone is seen in the course of the ureters. The bladder contains a Foley and is mostly decompressed, with redemonstrated wall thickening. Small amount of air within the bladder, not unexpected post Foley insertion. Stomach/Bowel: Large hiatal hernia. No evidence of obstruction. Colonic diverticulosis without evidence of diverticulitis. Vascular/Lymphatic: Severe atherosclerotic calcifications in the aorta and its branch vessels. 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 No lymphadenopathy. Reproductive: The prostate is enlarged and indents upon the urinary bladder.  Other: No free fluid or free air in the abdomen or pelvis. Musculoskeletal: Degenerative changes in the imaged thoracic and lumbar spine. Status post right femur intramedullary nail fixation. No acute osseous abnormality. IMPRESSION: 1. Mild left hydronephrosis, without evidence of obstructing stone. The bladder is mostly decompressed but again noted is bladder wall thickening, compatible with chronic bladder outlet obstruction, which may be secondary to enlarged prostate. 2. Moderate bilateral pleural effusions, ground-glass opacities in the imaged lungs, and bronchial wall thickening, most likely pulmonary edema, but infectious component cannot be excluded. 3. Aortic atherosclerosis (ICD10-I70.0) and unchanged celiac artery aneurysm. Electronically Signed   By: Wiliam Ke M.D.   On: 12/18/2020 14:12       LOS: 4 days   Nathan Mitchell Rito Ehrlich  [facility] Pager on [email or web address]  12/20/2020, 9:45 AM

## 2020-12-21 ENCOUNTER — Inpatient Hospital Stay (HOSPITAL_COMMUNITY): Payer: Medicare PPO

## 2020-12-21 DIAGNOSIS — N390 Urinary tract infection, site not specified: Secondary | ICD-10-CM | POA: Diagnosis not present

## 2020-12-21 DIAGNOSIS — J690 Pneumonitis due to inhalation of food and vomit: Secondary | ICD-10-CM

## 2020-12-21 DIAGNOSIS — A419 Sepsis, unspecified organism: Secondary | ICD-10-CM | POA: Diagnosis not present

## 2020-12-21 DIAGNOSIS — G9341 Metabolic encephalopathy: Secondary | ICD-10-CM | POA: Diagnosis not present

## 2020-12-21 DIAGNOSIS — R6520 Severe sepsis without septic shock: Secondary | ICD-10-CM | POA: Diagnosis not present

## 2020-12-21 DIAGNOSIS — G9340 Encephalopathy, unspecified: Secondary | ICD-10-CM | POA: Diagnosis not present

## 2020-12-21 LAB — CBC WITH DIFFERENTIAL/PLATELET
Abs Immature Granulocytes: 0.22 10*3/uL — ABNORMAL HIGH (ref 0.00–0.07)
Basophils Absolute: 0 10*3/uL (ref 0.0–0.1)
Basophils Relative: 0 %
Eosinophils Absolute: 0 10*3/uL (ref 0.0–0.5)
Eosinophils Relative: 0 %
HCT: 27.2 % — ABNORMAL LOW (ref 39.0–52.0)
Hemoglobin: 8.5 g/dL — ABNORMAL LOW (ref 13.0–17.0)
Immature Granulocytes: 2 %
Lymphocytes Relative: 6 %
Lymphs Abs: 0.8 10*3/uL (ref 0.7–4.0)
MCH: 28 pg (ref 26.0–34.0)
MCHC: 31.3 g/dL (ref 30.0–36.0)
MCV: 89.5 fL (ref 80.0–100.0)
Monocytes Absolute: 0.4 10*3/uL (ref 0.1–1.0)
Monocytes Relative: 3 %
Neutro Abs: 11.4 10*3/uL — ABNORMAL HIGH (ref 1.7–7.7)
Neutrophils Relative %: 89 %
Platelets: 250 10*3/uL (ref 150–400)
RBC: 3.04 MIL/uL — ABNORMAL LOW (ref 4.22–5.81)
RDW: 18.3 % — ABNORMAL HIGH (ref 11.5–15.5)
WBC: 12.9 10*3/uL — ABNORMAL HIGH (ref 4.0–10.5)
nRBC: 0 % (ref 0.0–0.2)

## 2020-12-21 LAB — COMPREHENSIVE METABOLIC PANEL
ALT: 21 U/L (ref 0–44)
AST: 24 U/L (ref 15–41)
Albumin: 2.3 g/dL — ABNORMAL LOW (ref 3.5–5.0)
Alkaline Phosphatase: 46 U/L (ref 38–126)
Anion gap: 10 (ref 5–15)
BUN: 45 mg/dL — ABNORMAL HIGH (ref 8–23)
CO2: 18 mmol/L — ABNORMAL LOW (ref 22–32)
Calcium: 8.3 mg/dL — ABNORMAL LOW (ref 8.9–10.3)
Chloride: 118 mmol/L — ABNORMAL HIGH (ref 98–111)
Creatinine, Ser: 1.75 mg/dL — ABNORMAL HIGH (ref 0.61–1.24)
GFR, Estimated: 37 mL/min — ABNORMAL LOW (ref >60)
Glucose, Bld: 125 mg/dL — ABNORMAL HIGH (ref 70–99)
Potassium: 4.6 mmol/L (ref 3.5–5.1)
Sodium: 146 mmol/L — ABNORMAL HIGH (ref 135–145)
Total Bilirubin: 0.9 mg/dL (ref 0.3–1.2)
Total Protein: 6 g/dL — ABNORMAL LOW (ref 6.5–8.1)

## 2020-12-21 LAB — GLUCOSE, CAPILLARY
Glucose-Capillary: 117 mg/dL — ABNORMAL HIGH (ref 70–99)
Glucose-Capillary: 117 mg/dL — ABNORMAL HIGH (ref 70–99)
Glucose-Capillary: 122 mg/dL — ABNORMAL HIGH (ref 70–99)
Glucose-Capillary: 123 mg/dL — ABNORMAL HIGH (ref 70–99)
Glucose-Capillary: 127 mg/dL — ABNORMAL HIGH (ref 70–99)
Glucose-Capillary: 129 mg/dL — ABNORMAL HIGH (ref 70–99)
Glucose-Capillary: 133 mg/dL — ABNORMAL HIGH (ref 70–99)

## 2020-12-21 LAB — MAGNESIUM: Magnesium: 2.4 mg/dL (ref 1.7–2.4)

## 2020-12-21 LAB — PHOSPHORUS: Phosphorus: 5.4 mg/dL — ABNORMAL HIGH (ref 2.5–4.6)

## 2020-12-21 MED ORDER — SODIUM CHLORIDE 0.45 % IV SOLN: INTRAVENOUS | Status: DC

## 2020-12-21 MED ORDER — VITAL HIGH PROTEIN PO LIQD
1000.0000 mL | ORAL | Status: DC
  Administered 2020-12-21: 1000 mL

## 2020-12-21 MED ORDER — LORAZEPAM 2 MG/ML IJ SOLN
0.5000 mg | Freq: Once | INTRAMUSCULAR | Status: AC
  Administered 2020-12-21: 0.5 mg via INTRAVENOUS
  Filled 2020-12-21: qty 1

## 2020-12-21 MED ORDER — METHOCARBAMOL 500 MG PO TABS
500.0000 mg | ORAL_TABLET | Freq: Three times a day (TID) | ORAL | Status: DC | PRN
  Administered 2020-12-21 – 2020-12-23 (×3): 500 mg
  Filled 2020-12-21 (×3): qty 1

## 2020-12-21 MED ORDER — ESCITALOPRAM OXALATE 10 MG PO TABS
10.0000 mg | ORAL_TABLET | Freq: Every day | ORAL | Status: AC
  Administered 2020-12-21 – 2020-12-22 (×2): 10 mg
  Filled 2020-12-21 (×2): qty 1

## 2020-12-21 MED ORDER — PROSOURCE TF PO LIQD
45.0000 mL | Freq: Two times a day (BID) | ORAL | Status: DC
  Administered 2020-12-21 – 2020-12-27 (×12): 45 mL
  Filled 2020-12-21 (×12): qty 45

## 2020-12-21 NOTE — Progress Notes (Signed)
eLink Physician-Brief Progress Note Patient Name: Nathan Mitchell DOB: 06/30/1930 MRN: [id number]   Date of Service  12/21/2020  HPI/Events of Note  Asking for 1:1 sitter, already on it for combative behaviors, risk to self injury. Recent SDH. Sepsis.  eICU Interventions  Re ordered sitter for tonight. AM team to decide further.      Intervention Category Minor Interventions: Agitation / anxiety - evaluation and management  Ranee Gosselin 12/21/2020, 3:07 AM

## 2020-12-21 NOTE — Progress Notes (Signed)
eLink Physician-Brief Progress Note Patient Name: Nathan Mitchell DOB: 1930-10-20 MRN: [id number]   Date of Service  12/21/2020  HPI/Events of Note  RN requesting something for pain, has arthritis and morphine is ineffective.  On precedex gtt, got morphine 25 once. Making urine. Fracture of femur recently. Asp PNA/UTI/AKI.  On 4 lit nasal canula. Qtc prolonged. Confused.    eICU Interventions  Ativan ordered for now. Avoiding toradol. Pain assessment by AM team.      Intervention Category Intermediate Interventions: Pain - evaluation and management  Ranee Gosselin 12/21/2020, 12:25 AM

## 2020-12-21 NOTE — Progress Notes (Signed)
SLP Cancellation Note  Patient Details Name: Nathan Mitchell MRN: [id number] DOB: July 14, 1930   Cancelled treatment:       Reason Eval/Treat Not Completed: Fatigue/lethargy limiting ability to participate. Pt on precedex this am. Per discussion with RN, likely not alert enough for PO trials but plan is for Cortrak placement today. Will f/u for ongoing PO trials as alertness improves.    Mahala Menghini., M.A. CCC-SLP Acute Rehabilitation Services Pager [phone] Office [phone]  12/21/2020, 9:46 AM

## 2020-12-21 NOTE — Progress Notes (Addendum)
NAME:  Nathan Mitchell, MRN:  [id number], DOB:  1930-11-30, LOS: 5 ADMISSION DATE:  2021-01-04, CONSULTATION DATE:  12/16/2020 REFERRING MD:  Dr. Roda Shutters, CHIEF COMPLAINT:  Encephalopathy   History of Present Illness:  87 y/o male who presented to ED with AMS, fatigue, and poor oral intake.  Found to have dyspnea and wheezing.  He has chronic foley from recent admission, and started on keflex for UTI on 12/11/20.  CT head showed stable Lt SDH.  He was started on ABx for possible sepsis and zyprexa for delirium.  He was in hospital in June 2022 for femur fracture and large Lt SDH.   Pertinent  Medical History  Aortic Regurgitation, giant cell arteritis, optic neuropathy, COPD, A.Fib, HLD, GERD, Depression, TBI, thoracic aortic aneurysm  Significant Hospital Events:  8/16 Admitted 8/19 back to ICU, start precedex 8/20 precedex held due to lethargy 8/21 progressive agitation >> precedex restarted. 8/22 on precedex, more calm  Interim History / Subjective:  Afebrile / WBC down to 12.9  Remains on precedex  Peterson 4L  I/O UOP, -161 ml in last 24 hours   Objective   Blood pressure (!) 148/64, pulse 62, temperature 97.7 F (36.5 C), temperature source Axillary, resp. rate (!) 21, height 6\' 1"  (1.854 m), weight 74.7 kg, SpO2 97 %.        Intake/Output Summary (Last 24 hours) at 12/21/2020 0733 Last data filed at 12/20/2020 2000 Gross per 24 hour  Intake 414.43 ml  Output 576 ml  Net -161.57 ml   Filed Weights   12/19/20 0350 12/20/20 0500 12/21/20 0500  Weight: 76.1 kg 74.2 kg 74.7 kg   Examination: General: elderly male lying in bed in NAD  HEENT: MM pink/moist, anicteric Neuro: drowsy on precedex, calm, wakes and looks at provider to voice CV: s1s2 irr irr, AF 60's on monitor, no m/r/g PULM: non-labored at rest, lungs bilaterally clear GI: soft, bsx4 active  Extremities: warm/dry, no edema  Skin: no rashes or lesions  Resolved Hospital Problem list     Assessment & Plan:    Acute metabolic encephalopathy likely from sepsis and ICU delirium. Lt SDH from June 2022. Hx of depression. -continue precedex, reduce celing to 0.7 -continue zosyn (concern cefepime was contributing to AMS) -resume lexapro when able to take PO's   UTI with chronic foley and Pseudomonas aeruginosa in urine culture from January 04, 2021. -D7/ABX -note abx change due to delirium on cefepime   AKI from ATN 2nd to sepsis. Hypernatremia Urine retention. -Trend BMP / urinary output -Replace electrolytes as indicated -Avoid nephrotoxic agents, ensure adequate renal perfusion -resume home flomax when able to take PO's  -add free water once cortrak placed  Hx of COPD. -pulmicort, brovana, yupelri -PRN albuterol   Hx of A fib, nonsustained VT. Prolonged Qtc. -tele monitoring   Anemia of critical illness and chronic disease. -trend CBC -transfuse for Hgb <7%  f/u CBC  Hx of giant cell arteritis. -hold outpatient methotrexate while NPO -continue solumedrol for now -resume home meds when able to take PO's   Dysphagia. -SLP when able, mental status remains barrier -place cortrak to allow for PO meds delivery, nutrition   Best Practice (right click and "Reselect all SmartList Selections" daily)  Diet/type: NPO DVT prophylaxis: Lovenox  GI prophylaxis: Protonix Code Status:  full code Last date of multidisciplinary goals of care discussion- per primary.    Labs    CMP Latest Ref Rng & Units 12/21/2020 12/20/2020 12/19/2020  Glucose 70 -  99 mg/dL 254(Y) 706(C) 376(E)  BUN 8 - 23 mg/dL 83(T) 51(V) 61(Y)  Creatinine 0.61 - 1.24 mg/dL 0.73(X) 1.06(Y) 6.94(W)  Sodium 135 - 145 mmol/L 146(H) 144 141  Potassium 3.5 - 5.1 mmol/L 4.6 4.2 4.5  Chloride 98 - 111 mmol/L 118(H) 114(H) 109  CO2 22 - 32 mmol/L 18(L) 21(L) 22  Calcium 8.9 - 10.3 mg/dL 8.3(L) 8.5(L) 8.6(L)  Total Protein 6.5 - 8.1 g/dL 6.0(L) - -  Total Bilirubin 0.3 - 1.2 mg/dL 0.9 - -  Alkaline Phos 38 - 126 U/L 46 - -  AST  15 - 41 U/L 24 - -  ALT 0 - 44 U/L 21 - -    CBC Latest Ref Rng & Units 12/21/2020 12/20/2020 12/19/2020  WBC 4.0 - 10.5 K/uL 12.9(H) 17.2(H) 9.5  Hemoglobin 13.0 - 17.0 g/dL 5.4(O) 2.7(O) 3.5(K)  Hematocrit 39.0 - 52.0 % 27.2(L) 27.5(L) 25.5(L)  Platelets 150 - 400 K/uL 250 279 252    ABG    Component Value Date/Time   PHART 7.403 12/16/2020 1312   PCO2ART 35.9 12/16/2020 1312   PO2ART 84.3 12/16/2020 1312   HCO3 21.6 12/16/2020 1312   TCO2 25 12/14/2020 1356   ACIDBASEDEF 2.2 (H) 12/16/2020 1312   O2SAT 95.4 12/16/2020 1312    CBG (last 3)  Recent Labs    12/20/20 1952 12/20/20 2356 12/21/20 0317  GLUCAP 83 123* 122*    Critical care time: 32 minutes   Canary Brim, MSN, APRN, NP-C, AGACNP-BC [facility] 12/21/2020, 7:33 AM   Please see [email or web address] for pager details.   From 7A-7P if no response, please call [phone] After hours, please call ELink [phone]

## 2020-12-21 NOTE — Progress Notes (Addendum)
Consider  [facility] PROGRESS NOTE   Nathan Mitchell QQV:[id number] DOB: 11/06/1930 DOA: 12/30/2020  PCP: Merlene Laughter, MD  Brief History/Interval Summary: 87 year old male presented to ED 8/16 with altered mental status, decreased oral intake, and generalized fatigue. On arrival with tachycardia, respiratory distress with expiratory wheezes. Family reports over last 24 hours he has become progressively confused and combative. CT head with stable left SDH. CXR with cardiomegaly with chronic interstitial thickening. Sodium 129. Crt 1.79. COVID negative. WBC 14.7. Started on Vancomycin/Cefepime. Throughout the night with continued confusion. Given Zyprexa 2.5 mg x 1.  Subsequently had to be transferred to the ICU.  Was placed on Precedex.  Remains confused.  Precedex was taken off yesterday morning.  Transferred back to [facility].  Of note patient was admitted in June of 2022 s/p fall and found to have fractured femur and large left SDH, underwent IM nail for femur fracture, discharged with foley catheter.     Consultants: Critical care medicine  Procedures: None yet  Antibiotics: Anti-infectives (From admission, onward)    Start     Dose/Rate Route Frequency Ordered Stop   01/08/21 0600  ceFAZolin (ANCEF) IVPB 2g/100 mL premix  Status:  Discontinued        2 g 200 mL/hr over 30 Minutes Intravenous  Once 12/16/20 1948 12/16/20 1959   12/20/20 1430  piperacillin-tazobactam (ZOSYN) IVPB 3.375 g        3.375 g 12.5 mL/hr over 240 Minutes Intravenous Every 8 hours 12/20/20 1337 12/21/20 2359   12/17/20 1500  vancomycin (VANCOREADY) IVPB 1750 mg/350 mL  Status:  Discontinued        1,750 mg 175 mL/hr over 120 Minutes Intravenous Every 48 hours 11/30/2020 1539 12/17/20 1024   12/17/20 1400  cefTRIAXone (ROCEPHIN) 2 g in sodium chloride 0.9 % 100 mL IVPB  Status:  Discontinued        2 g 200 mL/hr over 30 Minutes Intravenous Every 24 hours 12/17/20 1024 12/17/20 1226    12/17/20 1400  ceFEPIme (MAXIPIME) 2 g in sodium chloride 0.9 % 100 mL IVPB  Status:  Discontinued        2 g 200 mL/hr over 30 Minutes Intravenous Every 12 hours 12/17/20 1226 12/20/20 1332   12/16/20 2030  ceFAZolin (ANCEF) IVPB 2g/100 mL premix  Status:  Discontinued        2 g 200 mL/hr over 30 Minutes Intravenous  Once 12/16/20 1943 12/16/20 1948   12/16/20 0200  ceFEPIme (MAXIPIME) 2 g in sodium chloride 0.9 % 100 mL IVPB  Status:  Discontinued        2 g 200 mL/hr over 30 Minutes Intravenous Every 12 hours 12/06/2020 1539 12/17/20 1024   12/12/2020 2200  metroNIDAZOLE (FLAGYL) IVPB 500 mg  Status:  Discontinued        500 mg 100 mL/hr over 60 Minutes Intravenous Every 8 hours 12/07/2020 1953 12/17/20 1024   12/14/2020 1415  ceFEPIme (MAXIPIME) 2 g in sodium chloride 0.9 % 100 mL IVPB        2 g 200 mL/hr over 30 Minutes Intravenous  Once 12/06/2020 1402 12/17/2020 1528   12/30/2020 1415  metroNIDAZOLE (FLAGYL) IVPB 500 mg        500 mg 100 mL/hr over 60 Minutes Intravenous  Once 12/08/2020 1402 12/18/2020 1647   12/02/2020 1415  vancomycin (VANCOREADY) IVPB 1500 mg/300 mL        1,500 mg 150 mL/hr over 120 Minutes Intravenous  Once 12/29/2020  1402 12/09/2020 1900       Subjective/Interval History: Patient was placed back on Precedex infusion yesterday evening.  Noted to be much calmer this morning.  Does open his eyes to voice command but does not speak any words at this time.  He is noted to be in restraints.       Assessment/Plan:  Acute metabolic encephalopathy Most likely due to sepsis from UTI, ICU delirium, cefepime. CT head done at the time of admission did not show any acute findings.  Stable subdural hematoma was noted.   Patient has been on and off of Precedex over the last 72 hours.  Placed back on infusion yesterday evening for severe agitation.  Discussed with critical care medicine this morning. His agitation initially thought to be due to UTI and ICU delirium.  Patient was noted to  be on cefepime which can also cause encephalopathy.  Hence this was discontinued yesterday.  He was transitioned to Zosyn. EEG was also done which did not show any epileptiform activity. No focal neurological deficits have been noted during these episodes.  Continue to monitor for now and wait for recovery.  Aspiration pneumonia Remains on 4 L of oxygen via nasal cannula saturating in the mid 90s.  Chest x-ray shows chronic interstitial prominence.  Perhaps a little bit more prominent in the right base suggesting possible aspiration.  Continue Zosyn for now.  Aspiration precautions.  Speech therapy to see when he is more awake and cooperative.    Pseudomonas UTI with sepsis present on admission Likely in setting of Foley catheter that was placed in June after he sustained a left femur fracture.  It looks like Foley catheter was replaced on 8/17. Cefepime was changed over to Zosyn on 8/21.  Plan to treat until his mentation improves.  Blood cultures negative so far.    Pain, unspecified Patient was apparently experiencing pain based on the fact that he was grimacing and crying out.  Source of pain has been difficult to identify.   Abdominal film did not show any acute findings.  Patient does have a history of giant cell arteritis.  Critical care medicine started him on Solu-Medrol yesterday.  Currently patient is sedated.  Difficult to assess pain at this time.    Leukocytosis WBC noted to be better today.  Continue to monitor.  Remains afebrile.    Acute kidney injury Renal function has been stable for the last several days.  Holding Flomax.  Chronic indwelling Foley catheter is present.  Continue to monitor.  Recent CT renal study showed left hydronephrosis with no obstructing stone was noted. Mild hyponatremia is noted today.  We will change him from normal saline to half-normal saline  History of COPD Home medications being continued.  No active wheezing noted on examination.  History of  atrial fibrillation/nonsustained ventricular tachycardia/prolonged QT interval Monitor on telemetry.  Not on anticoagulation due to recent subdural hematoma.  Normocytic anemia No evidence of overt bleeding.  Continue to monitor hemoglobin.  History of giant cell arteritis Holding methotrexate.  Apparently has been off of it for several weeks.  Started on Solu-Medrol yesterday by critical care medicine.  Nutrition Has been unable to take anything by mouth several days due to high aspiration risk.  May not be unreasonable to proceed with nasogastric feeding tube at this time.     DVT Prophylaxis: Lovenox Code Status: Full code Family Communication: No family at bedside Disposition Plan: Unclear for now  Status is: Inpatient  Remains inpatient  appropriate because:Altered mental status, IV treatments appropriate due to intensity of illness or inability to take PO, and Inpatient level of care appropriate due to severity of illness  Dispo: The patient is from: Home              Anticipated d/c is to:  To be determined              Patient currently is not medically stable to d/c.   Difficult to place patient No        Medications: Scheduled:  arformoterol  15 mcg Nebulization BID   budesonide (PULMICORT) nebulizer solution  0.5 mg Nebulization BID   Chlorhexidine Gluconate Cloth  6 each Topical Daily   enoxaparin (LOVENOX) injection  30 mg Subcutaneous Q24H   insulin aspart  0-9 Units Subcutaneous Q4H   lidocaine  1 patch Transdermal Q24H   mouth rinse  15 mL Mouth Rinse BID   methylPREDNISolone (SOLU-MEDROL) injection  40 mg Intravenous Q12H   pantoprazole (PROTONIX) IV  40 mg Intravenous Q24H   revefenacin  175 mcg Nebulization Daily   sodium chloride flush  3 mL Intravenous Q12H   Continuous:  sodium chloride Stopped (12/21/20 0615)   dexmedetomidine (PRECEDEX) IV infusion 0.9 mcg/kg/hr (12/21/20 0800)   piperacillin-tazobactam (ZOSYN)  IV 12.5 mL/hr at 12/21/20 0800    UUV:[id number] **OR** acetaminophen, albuterol, metoprolol tartrate, morphine injection   Objective:  Vital Signs  Vitals:   12/21/20 0812 12/21/20 0813 12/21/20 0815 12/21/20 0900  BP:      Pulse:      Resp:      Temp:    (!) 96.3 F (35.7 C)  TempSrc:    Axillary  SpO2: 99% 99% 98%   Weight:      Height:        Intake/Output Summary (Last 24 hours) at 12/21/2020 0942 Last data filed at 12/21/2020 0800 Gross per 24 hour  Intake 1002.35 ml  Output 656 ml  Net 346.35 ml    Filed Weights   12/19/20 0350 12/20/20 0500 12/21/20 0500  Weight: 76.1 kg 74.2 kg 74.7 kg    General appearance: Sedated.  Opens his eyes briefly. Resp: Mildly tachypneic.  Few crackles on the right base.  No wheezing or rhonchi. Cardio: S1-S2 is normal regular.  No S3-S4.  No rubs murmurs or bruit GI: Abdomen is soft.  Nontender nondistended.  Bowel sounds are present normal.  No masses organomegaly Extremities: No edema.   Neurologic: No obvious focal neurological deficits noted.     Lab Results:  Data Reviewed: I have personally reviewed following labs and imaging studies  CBC: Recent Labs  Lab Dec 29, 2020 1425 12/16/20 0231 12/17/20 0153 12/18/20 0205 12/19/20 0031 12/20/20 0343 12/21/20 0150  WBC 16.9*   < > 13.4* 14.0* 9.5 17.2* 12.9*  NEUTROABS 15.0*  --   --   --   --   --  11.4*  HGB 8.9*   < > 8.3* 8.8* 8.0* 8.4* 8.5*  HCT 28.0*   < > 26.5* 28.8* 25.5* 27.5* 27.2*  MCV 87.0   < > 87.7 88.3 88.5 89.6 89.5  PLT 264   < > 230 295 252 279 250   < > = values in this interval not displayed.     Basic Metabolic Panel: Recent Labs  Lab 12/16/20 0231 12/17/20 0153 12/18/20 0205 12/19/20 0031 12/20/20 0343 12/21/20 0150  NA 133* 137 140 141 144 146*  K 4.2 3.9 4.5 4.5 4.2 4.6  CL 102  105 107 109 114* 118*  CO2 21* 22 24 22  21* 18*  GLUCOSE 106* 127* 107* 109* 101* 125*  BUN 27* 33* 45* 46* 42* 45*  CREATININE 1.63* 1.65* 1.79* 1.87* 1.86* 1.75*  CALCIUM 8.2* 8.5*  8.7* 8.6* 8.5* 8.3*  MG 1.8 2.0 2.2  --  2.0  --   PHOS  --  4.6  --   --   --   --      GFR: Estimated Creatinine Clearance: 30.8 mL/min (A) (by C-G formula based on SCr of 1.75 mg/dL (H)).  Liver Function Tests: Recent Labs  Lab 04/09/21 1425 12/16/20 0231 12/21/20 0150  AST 13* 14* 24  ALT 13 13 21   ALKPHOS 61 53 46  BILITOT 1.0 1.0 0.9  PROT 6.1* 5.9* 6.0*  ALBUMIN 2.5* 2.5* 2.3*      Recent Labs  Lab 12/16/20 1624  AMMONIA 29     Coagulation Profile: Recent Labs  Lab 04/09/21 1425 12/16/20 0231  INR 1.3* 1.3*      CBG: Recent Labs  Lab 12/20/20 1513 12/20/20 1952 12/20/20 2356 12/21/20 0317 12/21/20 0744  GLUCAP 81 83 123* 122* 133*      Recent Results (from the past 240 hour(s))  Resp Panel by RT-PCR (Flu A&B, Covid) Nasopharyngeal Swab     Status: None   Collection Time: 12/11/20  2:11 PM   Specimen: Nasopharyngeal Swab; Nasopharyngeal(NP) swabs in vial transport medium  Result Value Ref Range Status   SARS Coronavirus 2 by RT PCR NEGATIVE NEGATIVE Final    Comment: (NOTE) SARS-CoV-2 target nucleic acids are NOT DETECTED.  The SARS-CoV-2 RNA is generally detectable in upper respiratory specimens during the acute phase of infection. The lowest concentration of SARS-CoV-2 viral copies this assay can detect is 138 copies/mL. A negative result does not preclude SARS-Cov-2 infection and should not be used as the sole basis for treatment or other patient management decisions. A negative result may occur with  improper specimen collection/handling, submission of specimen other than nasopharyngeal swab, presence of viral mutation(s) within the areas targeted by this assay, and inadequate number of viral copies(<138 copies/mL). A negative result must be combined with clinical observations, patient history, and epidemiological information. The expected result is Negative.  Fact Sheet for Patients:   BloggerCourse.comhttps://www.fda.gov/media/152166/download  Fact Sheet for Healthcare Providers:  SeriousBroker.ithttps://www.fda.gov/media/152162/download  This test is no t yet approved or cleared by the [place] FDA and  has been authorized for detection and/or diagnosis of SARS-CoV-2 by FDA under an Emergency Use Authorization (EUA). This EUA will remain  in effect (meaning this test can be used) for the duration of the COVID-19 declaration under Section 564(b)(1) of the Act, 21 U.S.C.section 360bbb-3(b)(1), unless the authorization is terminated  or revoked sooner.       Influenza A by PCR NEGATIVE NEGATIVE Final   Influenza B by PCR NEGATIVE NEGATIVE Final    Comment: (NOTE) The Xpert Xpress SARS-CoV-2/FLU/RSV plus assay is intended as an aid in the diagnosis of influenza from Nasopharyngeal swab specimens and should not be used as a sole basis for treatment. Nasal washings and aspirates are unacceptable for Xpert Xpress SARS-CoV-2/FLU/RSV testing.  Fact Sheet for Patients: BloggerCourse.comhttps://www.fda.gov/media/152166/download  Fact Sheet for Healthcare Providers: SeriousBroker.ithttps://www.fda.gov/media/152162/download  This test is not yet approved or cleared by the [place] FDA and has been authorized for detection and/or diagnosis of SARS-CoV-2 by FDA under an Emergency Use Authorization (EUA). This EUA will remain in effect (meaning this test can be used) for  the duration of the COVID-19 declaration under Section 564(b)(1) of the Act, 21 U.S.C. section 360bbb-3(b)(1), unless the authorization is terminated or revoked.  Performed at [facility], [address] [place], [place] [address]   Resp Panel by RT-PCR (Flu A&B, Covid) Nasopharyngeal Swab     Status: None   Collection Time: 12/20/2020  2:02 PM   Specimen: Nasopharyngeal Swab; Nasopharyngeal(NP) swabs in vial transport medium  Result Value Ref Range Status   SARS Coronavirus 2 by RT PCR NEGATIVE NEGATIVE Final    Comment:  (NOTE) SARS-CoV-2 target nucleic acids are NOT DETECTED.  The SARS-CoV-2 RNA is generally detectable in upper respiratory specimens during the acute phase of infection. The lowest concentration of SARS-CoV-2 viral copies this assay can detect is 138 copies/mL. A negative result does not preclude SARS-Cov-2 infection and should not be used as the sole basis for treatment or other patient management decisions. A negative result may occur with  improper specimen collection/handling, submission of specimen other than nasopharyngeal swab, presence of viral mutation(s) within the areas targeted by this assay, and inadequate number of viral copies(<138 copies/mL). A negative result must be combined with clinical observations, patient history, and epidemiological information. The expected result is Negative.  Fact Sheet for Patients:  BloggerCourse.com  Fact Sheet for Healthcare Providers:  SeriousBroker.it  This test is no t yet approved or cleared by the [place] FDA and  has been authorized for detection and/or diagnosis of SARS-CoV-2 by FDA under an Emergency Use Authorization (EUA). This EUA will remain  in effect (meaning this test can be used) for the duration of the COVID-19 declaration under Section 564(b)(1) of the Act, 21 U.S.C.section 360bbb-3(b)(1), unless the authorization is terminated  or revoked sooner.       Influenza A by PCR NEGATIVE NEGATIVE Final   Influenza B by PCR NEGATIVE NEGATIVE Final    Comment: (NOTE) The Xpert Xpress SARS-CoV-2/FLU/RSV plus assay is intended as an aid in the diagnosis of influenza from Nasopharyngeal swab specimens and should not be used as a sole basis for treatment. Nasal washings and aspirates are unacceptable for Xpert Xpress SARS-CoV-2/FLU/RSV testing.  Fact Sheet for Patients: BloggerCourse.com  Fact Sheet for Healthcare  Providers: SeriousBroker.it  This test is not yet approved or cleared by the [place] FDA and has been authorized for detection and/or diagnosis of SARS-CoV-2 by FDA under an Emergency Use Authorization (EUA). This EUA will remain in effect (meaning this test can be used) for the duration of the COVID-19 declaration under Section 564(b)(1) of the Act, 21 U.S.C. section 360bbb-3(b)(1), unless the authorization is terminated or revoked.  Performed at [facility], [address] [place], [place] [address]   Urine Culture     Status: Abnormal   Collection Time: 11/30/2020  2:02 PM   Specimen: In/Out Cath Urine  Result Value Ref Range Status   Specimen Description IN/OUT CATH URINE  Final   Special Requests   Final    NONE Performed at [facility], [address] [place], [place] [address]    Culture >=100,000 COLONIES/mL PSEUDOMONAS AERUGINOSA (A)  Final   Report Status 12/18/2020 FINAL  Final   Organism ID, Bacteria PSEUDOMONAS AERUGINOSA (A)  Final      Susceptibility   Pseudomonas aeruginosa - MIC*    CEFTAZIDIME 4 SENSITIVE Sensitive     CIPROFLOXACIN <=0.25 SENSITIVE Sensitive     GENTAMICIN 4 SENSITIVE Sensitive     IMIPENEM 2 SENSITIVE Sensitive  PIP/TAZO 8 SENSITIVE Sensitive     CEFEPIME 4 SENSITIVE Sensitive     * >=100,000 COLONIES/mL PSEUDOMONAS AERUGINOSA  Blood Culture (routine x 2)     Status: None   Collection Time: 12-17-20  2:45 PM   Specimen: BLOOD  Result Value Ref Range Status   Specimen Description BLOOD SITE NOT SPECIFIED  Final   Special Requests   Final    BOTTLES DRAWN AEROBIC AND ANAEROBIC Blood Culture results may not be optimal due to an inadequate volume of blood received in culture bottles   Culture   Final    NO GROWTH 5 DAYS Performed at [facility], [address], [place], [place] [address]    Report Status 12/20/2020 FINAL  Final  Blood Culture (routine x 2)     Status: None    Collection Time: Dec 17, 2020  2:50 PM   Specimen: BLOOD  Result Value Ref Range Status   Specimen Description BLOOD SITE NOT SPECIFIED  Final   Special Requests   Final    BOTTLES DRAWN AEROBIC AND ANAEROBIC Blood Culture adequate volume   Culture   Final    NO GROWTH 5 DAYS Performed at [facility], [address], [place], [place] [address]    Report Status 12/20/2020 FINAL  Final  MRSA Next Gen by PCR, Nasal     Status: None   Collection Time: 12/16/20  4:43 PM   Specimen: Nasal Mucosa; Nasal Swab  Result Value Ref Range Status   MRSA by PCR Next Gen NOT DETECTED NOT DETECTED Final    Comment: (NOTE) The GeneXpert MRSA Assay (FDA approved for NASAL specimens only), is one component of a comprehensive MRSA colonization surveillance program. It is not intended to diagnose MRSA infection nor to guide or monitor treatment for MRSA infections. Test performance is not FDA approved in patients less than 105 years old. Performed at [facility], [address], [place], [place] [address]        Radiology Studies: DG Chest Port 1 View  Result Date: 12/20/2020 CLINICAL DATA:  Altered mental status.  Pleural effusion. EXAM: PORTABLE CHEST 1 VIEW COMPARISON:  12/16/2020 FINDINGS: Cardiomegaly remains stable. Aortic atherosclerotic calcification noted. Coarse pulmonary interstitial prominence is stable and suspicious for chronic interstitial lung disease. No evidence of pulmonary consolidation or pleural effusion. IMPRESSION: Stable cardiomegaly and probable chronic interstitial lung disease. No acute findings. Electronically Signed   By: Danae Orleans M.D.   On: 12/20/2020 10:45   DG Abd Portable 1V  Result Date: 12/20/2020 CLINICAL DATA:  Sepsis EXAM: PORTABLE ABDOMEN - 1 VIEW COMPARISON:  CT 12/18/2020 FINDINGS: Scattered airspace opacities in the visualized right lung base, with some motion degradation. Gas in nondilated small bowel and colon without suggestion of obstruction.  Aortic Atherosclerosis (ICD10-170.0). Right proximal femoral fixation hardware. IMPRESSION: Nonobstructive bowel gas pattern Electronically Signed   By: Corlis Leak M.D.   On: 12/20/2020 15:58       LOS: 5 days   Edge Mauger Rito Ehrlich  [facility] Pager on [email or web address]  12/21/2020, 9:42 AM

## 2020-12-21 NOTE — Procedures (Signed)
Cortrak  Person Inserting Tube:  Yahir Tavano, Verdon Cummins, RD Tube Type:  Cortrak - 43 inches Tube Size:  10 Tube Location:  Right nare Initial Placement:  Stomach Secured by: Bridle Technique Used to Measure Tube Placement:  Marking at nare/corner of mouth Cortrak Secured At:  70 cm  Cortrak Tube Team Note:  Consult received to place a Cortrak feeding tube.   X-ray is required, abdominal x-ray has been ordered by the Cortrak team. Please confirm tube placement before using the Cortrak tube.   If the tube becomes dislodged please keep the tube and contact the Cortrak team at [email or web address] (password [email or web address]) for replacement.  If after hours and replacement cannot be delayed, place a NG tube and confirm placement with an abdominal x-ray.    Eugene Gavia, MS, RD, LDN (she/her/hers) RD pager number and weekend/on-call pager number located in Amion.

## 2020-12-22 ENCOUNTER — Other Ambulatory Visit: Payer: Self-pay

## 2020-12-22 ENCOUNTER — Inpatient Hospital Stay (HOSPITAL_COMMUNITY): Payer: Medicare PPO

## 2020-12-22 DIAGNOSIS — N390 Urinary tract infection, site not specified: Secondary | ICD-10-CM

## 2020-12-22 DIAGNOSIS — A4152 Sepsis due to Pseudomonas: Secondary | ICD-10-CM

## 2020-12-22 LAB — MAGNESIUM
Magnesium: 2.5 mg/dL — ABNORMAL HIGH (ref 1.7–2.4)
Magnesium: 2.3 mg/dL (ref 1.7–2.4)

## 2020-12-22 LAB — CBC WITH DIFFERENTIAL/PLATELET
Abs Immature Granulocytes: 0.42 10*3/uL — ABNORMAL HIGH (ref 0.00–0.07)
Basophils Absolute: 0 10*3/uL (ref 0.0–0.1)
Basophils Relative: 0 %
Eosinophils Absolute: 0 10*3/uL (ref 0.0–0.5)
Eosinophils Relative: 0 %
HCT: 29.3 % — ABNORMAL LOW (ref 39.0–52.0)
Hemoglobin: 8.9 g/dL — ABNORMAL LOW (ref 13.0–17.0)
Immature Granulocytes: 2 %
Lymphocytes Relative: 6 %
Lymphs Abs: 1.2 10*3/uL (ref 0.7–4.0)
MCH: 27.4 pg (ref 26.0–34.0)
MCHC: 30.4 g/dL (ref 30.0–36.0)
MCV: 90.2 fL (ref 80.0–100.0)
Monocytes Absolute: 0.9 10*3/uL (ref 0.1–1.0)
Monocytes Relative: 4 %
Neutro Abs: 16.7 10*3/uL — ABNORMAL HIGH (ref 1.7–7.7)
Neutrophils Relative %: 88 %
Platelets: 348 10*3/uL (ref 150–400)
RBC: 3.25 MIL/uL — ABNORMAL LOW (ref 4.22–5.81)
RDW: 18.6 % — ABNORMAL HIGH (ref 11.5–15.5)
WBC: 19.2 10*3/uL — ABNORMAL HIGH (ref 4.0–10.5)
nRBC: 0 % (ref 0.0–0.2)

## 2020-12-22 LAB — C-REACTIVE PROTEIN: CRP: 11 mg/dL — ABNORMAL HIGH (ref <1.0)

## 2020-12-22 LAB — GLUCOSE, CAPILLARY
Glucose-Capillary: 124 mg/dL — ABNORMAL HIGH (ref 70–99)
Glucose-Capillary: 127 mg/dL — ABNORMAL HIGH (ref 70–99)
Glucose-Capillary: 154 mg/dL — ABNORMAL HIGH (ref 70–99)
Glucose-Capillary: 157 mg/dL — ABNORMAL HIGH (ref 70–99)
Glucose-Capillary: 141 mg/dL — ABNORMAL HIGH (ref 70–99)
Glucose-Capillary: 127 mg/dL — ABNORMAL HIGH (ref 70–99)

## 2020-12-22 LAB — COMPREHENSIVE METABOLIC PANEL
ALT: 22 U/L (ref 0–44)
AST: 22 U/L (ref 15–41)
Albumin: 2.4 g/dL — ABNORMAL LOW (ref 3.5–5.0)
Alkaline Phosphatase: 43 U/L (ref 38–126)
Anion gap: 11 (ref 5–15)
BUN: 59 mg/dL — ABNORMAL HIGH (ref 8–23)
CO2: 21 mmol/L — ABNORMAL LOW (ref 22–32)
Calcium: 8.7 mg/dL — ABNORMAL LOW (ref 8.9–10.3)
Chloride: 117 mmol/L — ABNORMAL HIGH (ref 98–111)
Creatinine, Ser: 1.77 mg/dL — ABNORMAL HIGH (ref 0.61–1.24)
GFR, Estimated: 36 mL/min — ABNORMAL LOW (ref >60)
Glucose, Bld: 135 mg/dL — ABNORMAL HIGH (ref 70–99)
Potassium: 4.3 mmol/L (ref 3.5–5.1)
Sodium: 149 mmol/L — ABNORMAL HIGH (ref 135–145)
Total Bilirubin: 1 mg/dL (ref 0.3–1.2)
Total Protein: 6 g/dL — ABNORMAL LOW (ref 6.5–8.1)

## 2020-12-22 LAB — PHOSPHORUS
Phosphorus: 4.4 mg/dL (ref 2.5–4.6)
Phosphorus: 3.8 mg/dL (ref 2.5–4.6)

## 2020-12-22 LAB — SEDIMENTATION RATE: Sed Rate: 58 mm/hr — ABNORMAL HIGH (ref 0–16)

## 2020-12-22 MED ORDER — MELATONIN 3 MG PO TABS
3.0000 mg | ORAL_TABLET | Freq: Every day | ORAL | Status: DC
  Administered 2020-12-22 – 2020-12-24 (×3): 3 mg
  Filled 2020-12-22 (×3): qty 1

## 2020-12-22 MED ORDER — OSMOLITE 1.2 CAL PO LIQD
1000.0000 mL | ORAL | Status: DC
  Administered 2020-12-22 – 2020-12-27 (×6): 1000 mL
  Filled 2020-12-22 (×12): qty 1000

## 2020-12-22 MED ORDER — VITAMIN B-12 1000 MCG PO TABS
1000.0000 ug | ORAL_TABLET | Freq: Every day | ORAL | Status: DC
  Administered 2020-12-22 – 2020-12-27 (×6): 1000 ug
  Filled 2020-12-22 (×6): qty 1

## 2020-12-22 MED ORDER — HALOPERIDOL LACTATE 5 MG/ML IJ SOLN
5.0000 mg | Freq: Two times a day (BID) | INTRAMUSCULAR | Status: AC
  Administered 2020-12-22 (×2): 5 mg via INTRAVENOUS
  Filled 2020-12-22 (×2): qty 1

## 2020-12-22 MED ORDER — FREE WATER
200.0000 mL | Freq: Four times a day (QID) | Status: DC
  Administered 2020-12-22 – 2020-12-23 (×5): 200 mL

## 2020-12-22 MED ORDER — PREDNISONE 20 MG PO TABS
20.0000 mg | ORAL_TABLET | Freq: Every day | ORAL | Status: DC
  Administered 2020-12-22 – 2020-12-23 (×2): 20 mg
  Filled 2020-12-22 (×2): qty 1

## 2020-12-22 MED ORDER — AMIODARONE IV BOLUS ONLY 150 MG/100ML
150.0000 mg | Freq: Once | INTRAVENOUS | Status: AC
  Administered 2020-12-23: 150 mg via INTRAVENOUS
  Filled 2020-12-22: qty 100

## 2020-12-22 MED ORDER — MELATONIN 3 MG PO TABS: 3.0000 mg | ORAL_TABLET | Freq: Every day | ORAL | Status: DC

## 2020-12-22 MED ORDER — PANTOPRAZOLE SODIUM 40 MG PO PACK
40.0000 mg | PACK | Freq: Every day | ORAL | Status: DC
  Administered 2020-12-23 – 2020-12-27 (×5): 40 mg
  Filled 2020-12-22 (×5): qty 20

## 2020-12-22 MED ORDER — WHITE PETROLATUM EX OINT
TOPICAL_OINTMENT | CUTANEOUS | Status: AC
  Administered 2020-12-22: 0.2
  Filled 2020-12-22: qty 28.35

## 2020-12-22 MED ORDER — FOLIC ACID 1 MG PO TABS
1.0000 mg | ORAL_TABLET | Freq: Every day | ORAL | Status: DC
  Administered 2020-12-22 – 2020-12-27 (×6): 1 mg
  Filled 2020-12-22 (×6): qty 1

## 2020-12-22 MED ORDER — METOPROLOL TARTRATE 5 MG/5ML IV SOLN
5.0000 mg | Freq: Once | INTRAVENOUS | Status: AC
Start: 2020-12-23 — End: 2020-12-22
  Administered 2020-12-22: 5 mg via INTRAVENOUS

## 2020-12-22 MED ORDER — ESCITALOPRAM OXALATE 20 MG PO TABS
20.0000 mg | ORAL_TABLET | Freq: Every day | ORAL | Status: DC
  Administered 2020-12-23 – 2020-12-27 (×5): 20 mg
  Filled 2020-12-22: qty 2
  Filled 2020-12-22 (×3): qty 1
  Filled 2020-12-22: qty 2

## 2020-12-22 NOTE — Progress Notes (Signed)
NAME:  Nathan Mitchell, MRN:  [id number], DOB:  16-Jun-1930, LOS: 6 ADMISSION DATE:  12/01/2020, CONSULTATION DATE:  12/16/2020 REFERRING MD:  Dr. Erlinda Hong, CHIEF COMPLAINT:  Encephalopathy   History of Present Illness:  87 y/o male who presented to ED with AMS, fatigue, and poor oral intake.  Found to have dyspnea and wheezing.  He has chronic foley from recent admission, and started on keflex for UTI on 12/11/20.  CT head showed stable Lt SDH.  He was started on ABx for possible sepsis and zyprexa for delirium.  He was in hospital in June 2022 for femur fracture and large Lt SDH.   Pertinent  Medical History  Aortic Regurgitation, giant cell arteritis, optic neuropathy, COPD, A.Fib, HLD, GERD, Depression, TBI, thoracic aortic aneurysm  Significant Hospital Events:  8/16 Admitted 8/19 back to ICU, start precedex 8/20 precedex held due to lethargy 8/21 progressive agitation >> precedex restarted. 8/22 on precedex, more calm 8/23 Remains on precedex, agitated overnight  Interim History / Subjective:  On precedex, remains agitated  Afebrile  I/O 1.1L UOP, +439m in last 24 hours  RN reports pt active in bed requiring restraints   Objective   Blood pressure 135/77, pulse 84, temperature 98.8 F (37.1 C), temperature source Axillary, resp. rate (!) 23, height '6\' 1"'  (1.854 m), weight 74.7 kg, SpO2 100 %.        Intake/Output Summary (Last 24 hours) at 12/22/2020 [id number] data filed at 12/22/2020 0700 Gross per 24 hour  Intake 1622.58 ml  Output 1145 ml  Net 477.58 ml   Filed Weights   12/20/20 0500 12/21/20 0500 12/22/20 0500  Weight: 74.2 kg 74.7 kg 74.7 kg   Examination: General: frail elderly male lying in bed in NAD HEENT: MM pink/dry, fair dentition, anicteric Neuro: Awakens to voice, speech clear, MAE, oriented to self CV: s1s2 irr irr, AF on monitor, no m/r/g PULM: non-labored at rest, lungs bilaterally clear GI: soft, bsx4 active  Extremities: warm/dry, no edema  Skin:  no rashes or lesions  Resolved Hospital Problem list     Assessment & Plan:   Acute metabolic encephalopathy likely from sepsis and ICU delirium. Lt SDH from June 2022. Hx of depression. -assess EKG to review QTC (note RBBB), consider trial of haldol  -? Component of steroid induced delirium, cefepime  -assess ESR, CRP, if not elevated consider rapid taper of steroids to off  -change solumedrol to prednisone 20 mg QD PT -continue home lexapro -precedex infusion for delirium  -abx stopped 8/22, completed 7 days  UTI with chronic foley and Pseudomonas aeruginosa in urine culture from 12/29/2020. Completed 7 days abx 8/22 -monitor for new symptoms -leukocytosis noted 8/23 > ? Related to steroids  AKI from ATN 2nd to sepsis. Hypernatremia Urine retention. -free water 200 ml Q6  -Trend BMP / urinary output -Replace electrolytes as indicated -Avoid nephrotoxic agents, ensure adequate renal perfusion -resume flomax when able to take PO's   Hx of COPD. -continue brovana, pulmicort, yupelri  -PRN albuterol   Hx of A fib, nonsustained VT. Prolonged Qtc. -tele monitoring   Anemia of critical illness and chronic disease. -follow CBC, transfuse for Hgb <7%  Hx of giant cell arteritis. -hold outpatient methotrexate -change steroids as above with delirium concerns  Dysphagia. -SLP evaluation when able to participate  -cortrak for PO meds and nutrition  -begin TF per nutrition  Best Practice (right click and "Reselect all SmartList Selections" daily)  Diet/type: NPO DVT prophylaxis: Lovenox  GI  prophylaxis: Protonix Code Status:  full code Last date of multidisciplinary goals of care discussion:  pending.  Daughter, [name], called for update.  No answer, message left for return call.   Critical care time: 6 minutes   Noe Gens, MSN, APRN, NP-C, AGACNP-BC [facility] 12/22/2020, 7:28 AM   Please see [email or web address] for pager details.   From 7A-7P  if no response, please call [phone] After hours, please call ELink [phone]

## 2020-12-22 NOTE — Progress Notes (Signed)
Pt received 0.5 mg Morphine concentration 2mg /ml; attempted to waste Morphine 1.5 mg in pyxis;  , RN co-signed on attempted waste; received prompt amount exceeds amount remaining; findings verified with Acey Lav, PhD.

## 2020-12-22 NOTE — Progress Notes (Signed)
eLink Physician-Brief Progress Note Patient Name: Nathan Mitchell DOB: 09-07-1930 MRN: [id number]   Date of Service  12/22/2020  HPI/Events of Note  Patient vomited and bedside RN concerned he may have aspirated. BP 168/99, heart rate 131.  eICU Interventions  Stat portable CXR ordered, Lopressor 5 mg iv x 1 ordered.        Thomasene Lot Jerilynn Feldmeier 12/22/2020, 11:22 PM

## 2020-12-22 NOTE — Progress Notes (Signed)
Initial Nutrition Assessment  DOCUMENTATION CODES:   Not applicable  INTERVENTION:   Tube feeding via Cortrak: Change to Osmolite 1.2 at 70 ml/h (1680 ml per day) Prosource TF 45 ml BID  Provides 2096 kcal, 115 gm protein, 1378 ml free water daily.  NUTRITION DIAGNOSIS:   Inadequate oral intake related to lethargy/confusion as evidenced by NPO status.  GOAL:   Patient will meet greater than or equal to 90% of their needs  MONITOR:   TF tolerance, Labs, Diet advancement  REASON FOR ASSESSMENT:   Consult Enteral/tube feeding initiation and management, Assessment of nutrition requirement/status  ASSESSMENT:   87 yo male admitted with AMS, fatigue, poor oral intake. PMH includes recent admission for femur fx and large L SDH, giant cell arteritis, optic neuropathy, COPD, A fib, HLD, GERD, depression, TBI, thoracic aortic aneurysm.  Patient currently requiring Precedex; agitated overnight.  Patient unable to participate in swallow evaluation with SLP on 8/22 d/t lethargy. Cortrak placed 8/22, tip is in the descending duodenum. Received MD Consult for TF initiation and management. Currently receiving Currently receiving Vital High Protein at 40 ml/h with Prosource TF 45 ml BID to provide 1040 kcal, 106 gm protein, 803 ml free water daily.  Previously on a heart healthy diet (8/18-8/19) with 50-75% meal intakes. He has been NPO for the remainder of this admission.   Weight history reviewed. 92 kg on 09/05/20, currently 74.7 kg. 19% weight loss within 3.5 months is severe. Suspect patient is malnourished.   Labs reviewed. Na 149, BUN 59, creat 1.77, mag 2.5 CBG: 124-127  Medications reviewed and include novolog, prednisone.  NUTRITION - FOCUSED PHYSICAL EXAM:  Unable to complete  Diet Order:   Diet Order             Diet NPO time specified  Diet effective now                   EDUCATION NEEDS:   Not appropriate for education at this time  Skin:  Skin  Assessment: Reviewed RN Assessment  Last BM:  8/23  Height:   Ht Readings from Last 1 Encounters:  01/03/2021 6\' 1"  (1.854 m)    Weight:   Wt Readings from Last 1 Encounters:  12/22/20 74.7 kg    Ideal Body Weight:  83.6 kg  BMI:  Body mass index is 21.73 kg/m.  Estimated Nutritional Needs:   Kcal:  2000-2200  Protein:  100-115 gm  Fluid:  >/= 2 L    12/24/20, RD, LDN, CNSC Please refer to Amion for contact information.

## 2020-12-22 NOTE — Evaluation (Signed)
Physical Therapy Evaluation Patient Details Name: Nathan Mitchell MRN: [id number] DOB: [birth year] Today's Date: 12/22/2020   History of Present Illness  87 yo male presents to [facility] on 8/12 and again 8/16 with fever, AMS. Workup for sepsis secondary to UTI, metabolic encephalopathy. PMH includes: afib, IM nail of R femur and burr hole placement (L SDH) on 10/08/2020 due to fall, giant cell arteritis, osteopenia, thoracic aortic aneurysm, former smoker.   Clinical Impression  Pt presents with generalized weakness, impaired mobility, altered mental status vs baseline, poor standing balance, and decreased activity tolerance vs baseline. Pt to benefit from acute PT to address deficits. Pt overall requiring mod +2 assist for transfer to stand and lateral stepping towards [employer], further mobility discontinued due to tachycardia up to 130 bpm. PT to progress mobility as tolerated, and will continue to follow acutely.      Follow Up Recommendations SNF;Supervision/Assistance - 24 hour    Equipment Recommendations  None recommended by PT    Recommendations for Other Services       Precautions / Restrictions Precautions Precautions: Fall Precaution Comments: waist and bilat wrist restraints, cortrak Restrictions Weight Bearing Restrictions: No      Mobility  Bed Mobility Overal bed mobility: Needs Assistance Bed Mobility: Supine to Sit;Sit to Supine     Supine to sit: Mod assist;+2 for physical assistance Sit to supine: Mod assist;+2 for physical assistance   General bed mobility comments: mod +2 for trunk and LE management, scooting to/from EOB, boost up in bed upon return to supine.    Transfers Overall transfer level: Needs assistance Equipment used: 2 person hand held assist Transfers: Sit to/from Stand Sit to Stand: Mod assist;+2 physical assistance;From elevated surface         General transfer comment: Mod +2 for rise, steady, and x2 lateral steps towards HOB. HR elevated  to 130 bpm in standing, recovers with seated rest, further mobility OOB terminated.  Ambulation/Gait                Stairs            Wheelchair Mobility    Modified Rankin (Stroke Patients Only)       Balance Overall balance assessment: Needs assistance;History of Falls Sitting-balance support: No upper extremity supported Sitting balance-Leahy Scale: Fair Sitting balance - Comments: able to sit EOB without PT assist   Standing balance support: Bilateral upper extremity supported;During functional activity Standing balance-Leahy Scale: Poor Standing balance comment: reliant on external support                             Pertinent Vitals/Pain Pain Assessment: Faces Faces Pain Scale: Hurts a little bit Pain Location: generalized during mobility Pain Descriptors / Indicators: Guarding;Grimacing Pain Intervention(s): Limited activity within patient's tolerance;Monitored during session;Repositioned    Home Living Family/patient expects to be discharged to:: Private residence Living Arrangements: Spouse/significant other Available Help at Discharge: Family Type of Home: House Home Access: Level entry     Home Layout: Two level;Able to live on main level with bedroom/bathroom Home Equipment: Gilmer Mor - single point;Shower seat;Grab bars - tub/shower;Grab bars - toilet      Prior Function Level of Independence: Needs assistance   Gait / Transfers Assistance Needed: per pt's wife, pt uses RW at home and has been walking household distances  ADL's / Homemaking Assistance Needed: Pt's wife states "yes" needs help but does not elaborate when asked specifics  Hand Dominance   Dominant Hand: Right    Extremity/Trunk Assessment   Upper Extremity Assessment Upper Extremity Assessment: Defer to OT evaluation    Lower Extremity Assessment Lower Extremity Assessment: Generalized weakness    Cervical / Trunk Assessment Cervical / Trunk  Assessment: Normal  Communication   Communication: No difficulties  Cognition Arousal/Alertness: Awake/alert Behavior During Therapy: Restless;Impulsive Overall Cognitive Status: Impaired/Different from baseline Area of Impairment: Orientation;Attention;Memory;Following commands;Safety/judgement;Problem solving                 Orientation Level: Disoriented to;Time;Situation;Place Current Attention Level: Sustained Memory: Decreased short-term memory Following Commands: Follows one step commands inconsistently;Follows one step commands with increased time Safety/Judgement: Decreased awareness of deficits;Decreased awareness of safety   Problem Solving: Slow processing;Decreased initiation;Difficulty sequencing;Requires verbal cues;Requires tactile cues General Comments: pt oriented to self and "[place]" but not hospital, situation, or year. Pt requires cues to stay on task, at times becomes irritable and resistant to mobility.      General Comments      Exercises General Exercises - Lower Extremity Ankle Circles/Pumps: Both;AAROM;20 reps;Supine Short Arc Quad: AAROM;Both;15 reps;Supine Hip ABduction/ADduction: AAROM;Both;15 reps;Supine   Assessment/Plan    PT Assessment Patient needs continued PT services  PT Problem List Decreased strength;Decreased mobility;Decreased safety awareness;Decreased activity tolerance;Decreased balance;Decreased knowledge of use of DME;Pain;Cardiopulmonary status limiting activity;Decreased cognition;Decreased coordination       PT Treatment Interventions DME instruction;Therapeutic activities;Gait training;Therapeutic exercise;Patient/family education;Balance training;Functional mobility training;Neuromuscular re-education    PT Goals (Current goals can be found in the Care Plan section)  Acute Rehab PT Goals Patient Stated Goal: decrease confusion PT Goal Formulation: With family Time For Goal Achievement: 01/05/21 Potential to  Achieve Goals: Good    Frequency Min 2X/week   Barriers to discharge        Co-evaluation               AM-PAC PT "6 Clicks" Mobility  Outcome Measure Help needed turning from your back to your side while in a flat bed without using bedrails?: A Lot Help needed moving from lying on your back to sitting on the side of a flat bed without using bedrails?: A Lot Help needed moving to and from a bed to a chair (including a wheelchair)?: A Lot Help needed standing up from a chair using your arms (e.g., wheelchair or bedside chair)?: A Lot Help needed to walk in hospital room?: A Lot Help needed climbing 3-5 steps with a railing? : Total 6 Click Score: 11    End of Session Equipment Utilized During Treatment: Oxygen (4LO2) Activity Tolerance: Patient tolerated treatment well;Patient limited by fatigue;Treatment limited secondary to medical complications (Comment) (tachycardia) Patient left: in bed;with call bell/phone within reach;with bed alarm set;with family/visitor present;with restraints reapplied Nurse Communication: Mobility status PT Visit Diagnosis: Other abnormalities of gait and mobility (R26.89);Muscle weakness (generalized) (M62.81)    Time: 3614-4315 PT Time Calculation (min) (ACUTE ONLY): 17 min   Charges:   PT Evaluation $PT Eval Low Complexity: 1 Low         Alison Breeding S, PT DPT Acute Rehabilitation Services Pager [phone]  Office [phone]   Truddie Coco 12/22/2020, 3:48 PM

## 2020-12-22 NOTE — Progress Notes (Signed)
  Speech Language Pathology Treatment: Dysphagia  Patient Details Name: Nathan Mitchell MRN: [id number] DOB: [birth year] Today's Date: 12/22/2020 Time: 0940-1000 SLP Time Calculation (min) (ACUTE ONLY): 20 min  Assessment / Plan / Recommendation Clinical Impression  Pt mentation still remains barrier for diet readiness. Pt was responsive to cues this date however remains confused. Per RN, pt requiring Haldol due to restlessness and agitation, this was administered prior to SLP arrival. Performed diligent oral care, xerostomia noted in setting of NPO status. Pt masticated PO ice chips with suspected delay in swallow initiation per palpation. Pt required cues for ability to syphoon thin liquids via straw. Delayed cough noted in 2/4 trials. Pt with bolus manipulation of puree and no overt s/sx of aspiration. Recommend addition of puree from floor stock with staff as mentation allows and continuation of ice chips, water protocol as clinically appropriate. With more time anticipate pt will be good candidate for diet readiness. SLP will continue to closely follow.     HPI HPI: Pt is an 87 y/o male who presented to the ED with AMS, fatigue, and poor oral intake.  Found to have dyspnea and wheezing.  He has chronic foley from recent admission, and started on keflex for UTI on 8/12.  CT head showed stable Lt SDH.  CXR 8/17: No  acute findings. Dx possible sepsis, acute metabolic encephalopathy thought to be from sepsis and ICU delirium. PMH: Aortic Regurgitation, giant cell arteritis, optic neuropathy, COPD, A.Fib, HLD, GERD, Depression, TBI, thoracic aortic aneurysm.      SLP Plan  Continue with current plan of care       Recommendations  Diet recommendations: Other(comment) (ice chip, water protocol, puree from floorstock as mentation permits with staff) Liquids provided via: Straw;Cup Medication Administration: Crushed with puree Supervision: Full supervision/cueing for compensatory  strategies Compensations: Small sips/bites;Slow rate;Minimize environmental distractions Postural Changes and/or Swallow Maneuvers: Seated upright 90 degrees;Upright 30-60 min after meal                Oral Care Recommendations: Oral care QID;Oral care prior to ice chip/H20 Follow up Recommendations: Other (comment) (TBD) SLP Visit Diagnosis: Dysphagia, unspecified (R13.10) Plan: Continue with current plan of care       GO                Ardyth Gal MA, CCC-SLP Acute Rehabilitation Services   12/22/2020, 10:08 AM

## 2020-12-23 LAB — CBC WITH DIFFERENTIAL/PLATELET
Abs Immature Granulocytes: 0.75 10*3/uL — ABNORMAL HIGH (ref 0.00–0.07)
Basophils Absolute: 0 10*3/uL (ref 0.0–0.1)
Basophils Relative: 0 %
Eosinophils Absolute: 0 10*3/uL (ref 0.0–0.5)
Eosinophils Relative: 0 %
HCT: 29.6 % — ABNORMAL LOW (ref 39.0–52.0)
Hemoglobin: 8.8 g/dL — ABNORMAL LOW (ref 13.0–17.0)
Immature Granulocytes: 4 %
Lymphocytes Relative: 8 %
Lymphs Abs: 1.5 10*3/uL (ref 0.7–4.0)
MCH: 26.9 pg (ref 26.0–34.0)
MCHC: 29.7 g/dL — ABNORMAL LOW (ref 30.0–36.0)
MCV: 90.5 fL (ref 80.0–100.0)
Monocytes Absolute: 1.2 10*3/uL — ABNORMAL HIGH (ref 0.1–1.0)
Monocytes Relative: 7 %
Neutro Abs: 14.6 10*3/uL — ABNORMAL HIGH (ref 1.7–7.7)
Neutrophils Relative %: 81 %
Platelets: 372 10*3/uL (ref 150–400)
RBC: 3.27 MIL/uL — ABNORMAL LOW (ref 4.22–5.81)
RDW: 19 % — ABNORMAL HIGH (ref 11.5–15.5)
WBC: 18.1 10*3/uL — ABNORMAL HIGH (ref 4.0–10.5)
nRBC: 0.1 % (ref 0.0–0.2)

## 2020-12-23 LAB — COMPREHENSIVE METABOLIC PANEL
ALT: 24 U/L (ref 0–44)
AST: 20 U/L (ref 15–41)
Albumin: 2.4 g/dL — ABNORMAL LOW (ref 3.5–5.0)
Alkaline Phosphatase: 38 U/L (ref 38–126)
Anion gap: 6 (ref 5–15)
BUN: 58 mg/dL — ABNORMAL HIGH (ref 8–23)
CO2: 22 mmol/L (ref 22–32)
Calcium: 8.6 mg/dL — ABNORMAL LOW (ref 8.9–10.3)
Chloride: 121 mmol/L — ABNORMAL HIGH (ref 98–111)
Creatinine, Ser: 1.5 mg/dL — ABNORMAL HIGH (ref 0.61–1.24)
GFR, Estimated: 45 mL/min — ABNORMAL LOW (ref >60)
Glucose, Bld: 126 mg/dL — ABNORMAL HIGH (ref 70–99)
Potassium: 4.1 mmol/L (ref 3.5–5.1)
Sodium: 149 mmol/L — ABNORMAL HIGH (ref 135–145)
Total Bilirubin: 0.8 mg/dL (ref 0.3–1.2)
Total Protein: 5.9 g/dL — ABNORMAL LOW (ref 6.5–8.1)

## 2020-12-23 LAB — BRAIN NATRIURETIC PEPTIDE: B Natriuretic Peptide: 704.3 pg/mL — ABNORMAL HIGH (ref 0.0–100.0)

## 2020-12-23 LAB — GLUCOSE, CAPILLARY
Glucose-Capillary: 105 mg/dL — ABNORMAL HIGH (ref 70–99)
Glucose-Capillary: 132 mg/dL — ABNORMAL HIGH (ref 70–99)
Glucose-Capillary: 105 mg/dL — ABNORMAL HIGH (ref 70–99)
Glucose-Capillary: 146 mg/dL — ABNORMAL HIGH (ref 70–99)
Glucose-Capillary: 116 mg/dL — ABNORMAL HIGH (ref 70–99)
Glucose-Capillary: 108 mg/dL — ABNORMAL HIGH (ref 70–99)

## 2020-12-23 LAB — PROCALCITONIN: Procalcitonin: <0.1 ng/mL

## 2020-12-23 LAB — PHOSPHORUS: Phosphorus: 3.6 mg/dL (ref 2.5–4.6)

## 2020-12-23 LAB — MAGNESIUM: Magnesium: 2.3 mg/dL (ref 1.7–2.4)

## 2020-12-23 MED ORDER — FREE WATER
200.0000 mL | Status: DC
  Administered 2020-12-23 – 2020-12-27 (×25): 200 mL

## 2020-12-23 MED ORDER — DEXTROSE 5 % IV SOLN: INTRAVENOUS | Status: DC

## 2020-12-23 MED ORDER — PREDNISONE 20 MG PO TABS
20.0000 mg | ORAL_TABLET | Freq: Every day | ORAL | Status: DC
  Administered 2020-12-24 – 2020-12-27 (×4): 20 mg
  Filled 2020-12-23 (×4): qty 1

## 2020-12-23 MED ORDER — SIMETHICONE 40 MG/0.6ML PO SUSP
40.0000 mg | Freq: Three times a day (TID) | ORAL | Status: DC
  Administered 2020-12-23 – 2020-12-27 (×11): 40 mg
  Filled 2020-12-23 (×16): qty 0.6

## 2020-12-23 MED ORDER — SENNOSIDES-DOCUSATE SODIUM 8.6-50 MG PO TABS
1.0000 | ORAL_TABLET | Freq: Every day | ORAL | Status: DC
  Administered 2020-12-23 – 2020-12-26 (×4): 1
  Filled 2020-12-23 (×4): qty 1

## 2020-12-23 MED ORDER — PREDNISONE 10 MG PO TABS: 10.0000 mg | ORAL_TABLET | Freq: Every day | ORAL | Status: DC

## 2020-12-23 MED ORDER — DEXTROSE 5 % IV SOLN: INTRAVENOUS | Status: AC

## 2020-12-23 MED ORDER — SIMETHICONE 40 MG/0.6ML PO SUSP
40.0000 mg | Freq: Three times a day (TID) | ORAL | Status: DC
  Filled 2020-12-23: qty 0.6

## 2020-12-23 MED ORDER — OLANZAPINE 10 MG IM SOLR
5.0000 mg | Freq: Once | INTRAMUSCULAR | Status: DC | PRN
  Filled 2020-12-23: qty 10

## 2020-12-23 MED ORDER — METOPROLOL TARTRATE 5 MG/5ML IV SOLN
2.5000 mg | INTRAVENOUS | Status: DC | PRN
  Administered 2020-12-23 – 2020-12-27 (×8): 2.5 mg via INTRAVENOUS
  Filled 2020-12-23 (×10): qty 5

## 2020-12-23 MED ORDER — PIPERACILLIN-TAZOBACTAM 3.375 G IVPB
3.3750 g | Freq: Three times a day (TID) | INTRAVENOUS | Status: DC
  Administered 2020-12-23 – 2020-12-24 (×3): 3.375 g via INTRAVENOUS
  Filled 2020-12-23 (×4): qty 50

## 2020-12-23 MED ORDER — GUAIFENESIN 100 MG/5ML PO SOLN
10.0000 mL | Freq: Two times a day (BID) | ORAL | Status: DC
  Administered 2020-12-23 – 2020-12-27 (×9): 200 mg
  Filled 2020-12-23 (×9): qty 10

## 2020-12-23 NOTE — Progress Notes (Addendum)
NAME:  Nathan Mitchell, MRN:  [id number], DOB:  August 01, 1930, LOS: 7 ADMISSION DATE:  12/02/2020, CONSULTATION DATE:  12/16/2020 REFERRING MD:  Dr. Roda Shutters, CHIEF COMPLAINT:  Encephalopathy   History of Present Illness:  87 y/o male who presented to ED with AMS, fatigue, and poor oral intake.  Found to have dyspnea and wheezing.  He has chronic foley from recent admission, and started on keflex for UTI on 12/11/20.  CT head showed stable Lt SDH.  He was started on ABx for possible sepsis and zyprexa for delirium.  He was in hospital in June 2022 for femur fracture and large Lt SDH.   Pertinent  Medical History  Aortic Regurgitation, giant cell arteritis, optic neuropathy, COPD, A.Fib, HLD, GERD, Depression, TBI, thoracic aortic aneurysm  Significant Hospital Events:  8/16 Admitted 8/19 back to ICU, start precedex 8/20 precedex held due to lethargy 8/21 progressive agitation >> precedex restarted. 8/22 on precedex, more calm 8/23 Remains on precedex, agitated overnight.  Precedex stopped during day.   Interim History / Subjective:  Afebrile  Pt vomited overnight  I/O 1.7L UOP, 1.1L+ in last 24 hours  RN reports he was agitated this am, wheezing.  Given morphine for pain, tylenol and robaxin.  Settled out / calm.   Objective   Blood pressure 124/71, pulse 91, temperature (!) 97.4 F (36.3 C), temperature source Oral, resp. rate (!) 26, height 6\' 1"  (1.854 m), weight 74.7 kg, SpO2 99 %.    FiO2 (%):  [4 %] 4 %   Intake/Output Summary (Last 24 hours) at 12/23/2020 0829 Last data filed at 12/23/2020 0800 Gross per 24 hour  Intake 2579.39 ml  Output 1725 ml  Net 854.39 ml   Filed Weights   12/21/20 0500 12/22/20 0500 12/23/20 0500  Weight: 74.7 kg 74.7 kg 74.7 kg   Examination: General: frail elderly male lying in bed in NAD   HEENT: MM pink/dry, fair dentition, anicteric  Neuro: Awakens to voice, oriented to self, follows commands, MAE CV: s1s2 RRR, no m/r/g PULM: non-labored at  rest, lungs bilaterally clear GI: soft, bsx4 active  Extremities: warm/dry, no edema  Skin: no rashes or lesions  Resolved Hospital Problem list     Assessment & Plan:   Acute metabolic encephalopathy likely from sepsis and ICU delirium. Lt SDH from June 2022. Hx of depression. Concern for cefepime, steroids contributing to delirium. S/p haldol 8/23 x2 doses, off precedex.  Completed abx 8/22.  -minimize sedating medications -continue home lexapro -prednisone to 20 mg QD PT in lieu of methotrexate  -continue home B12, melatonin, folic acid   Emesis with Concern for Aspiration  8/23 CXR with concern for worsening airspace disease  -begin zosyn for empiric aspiration coverage  -assess PCT, if not elevated consider stopping abx -follow up CXR in am  -add guaifenesin for secretions  UTI with chronic foley and Pseudomonas aeruginosa in urine culture from 12/28/2020. Completed 7 days abx 8/22 -monitor for new symptoms  -foley in place for retention   AKI from ATN 2nd to sepsis. Hypernatremia Urine retention. -increase free water to 200 Q4  -D5w at 45ml/hr for 24 hours  -resume flomax when able to take PO's    -Trend BMP / urinary output -Replace electrolytes as indicated -Avoid nephrotoxic agents, ensure adequate renal perfusion  Constipation  -add simethicone, senakot S  Hx of COPD. -continue pulmicort, brovana, yupelri  -PRN albuterol   AFwRVR Hx of A fib, nonsustained VT.  Prolonged Qtc. Recent SDH, not  candidate for anticoagulation  -tele monitoring  -PRN lopressor   Anemia of critical illness and chronic disease. -follow CBC, transfuse for Hgb <7%  Hx of giant cell arteritis. -hold home methotrexate  -reduce steroids as above with delirium   Dysphagia Emesis 8/24 -appreciate SLP  -continue cortrak  -resume TF at 40ml/hr x4 hours then advance as tolerated  Best Practice (right click and "Reselect all SmartList Selections" daily)  Diet/type: NPO DVT  prophylaxis: Lovenox  GI prophylaxis: Protonix Code Status:  full code Last date of multidisciplinary goals of care discussion:  pending.  Wife, [name], updated via phone on plan of care 8/24.    Critical care time: 32 minutes   Canary Brim, MSN, APRN, NP-C, AGACNP-BC [facility] 12/23/2020, 8:29 AM   Please see [email or web address] for pager details.   From 7A-7P if no response, please call [phone] After hours, please call ELink [phone]

## 2020-12-23 NOTE — Progress Notes (Signed)
eLink Physician-Brief Progress Note Patient Name: Nathan Mitchell DOB: 29-Nov-1930 MRN: [id number]   Date of Service  12/23/2020  HPI/Events of Note  Persistent Afib with RVR.  eICU Interventions  Amiodarone 150 mg iv x 1, PRN iv Lopressor changed to 2.5 mg iv Q 3 hours PRN.        Thomasene Lot Trase Bunda 12/23/2020, 12:02 AM

## 2020-12-23 NOTE — Progress Notes (Signed)
eLink Physician-Brief Progress Note Patient Name: Nathan Mitchell DOB: Feb 23, 1931 MRN: [id number]   Date of Service  12/23/2020  HPI/Events of Note  CXR reviewed, r/o pulmonary edema component. Serum Na+ 149.  eICU Interventions  BNP ordered to assess volume status.        Nathan Mitchell 12/23/2020, 2:23 AM

## 2020-12-23 NOTE — Progress Notes (Signed)
eLink Physician-Brief Progress Note Patient Name: Nathan Mitchell DOB: Jan 23, 1931 MRN: [id number]   Date of Service  12/23/2020  HPI/Events of Note  Patient ws in Afib with RVR (rate in the 140's) but Lopressor 2.5 mg iv brought his rate down to 80-90 currently. QTc is 533. He's had episodic agitated dementia-like issues, including trying to climb out of bed per bedside RN.  eICU Interventions  No indication for rhythm Rx at the moment. Will order a single PRN dose of 5 mg of Zyprexa IM to be administered for severe agitation only.        Thomasene Lot Gareld Obrecht 12/23/2020, 10:02 PM

## 2020-12-23 NOTE — Progress Notes (Signed)
Pharmacy Antibiotic Note-Initial Consult  Nathan Mitchell is a 87 y.o. male admitted on 12/22/2020 with AMS, fatigue and poor oral intake concerning for sepsis.  He has a chronic foley catheter from a previous admission, and urine culture showed Pseudomonas aeroginosa, for which the patient received a full course of antibiotics. Patient's mental status has not significantly improved over the course of the hospital stay (oriented to self only). Overnight 8/23, patient had emesis, leukocytosis, and 8/23 CXR concerning for worsening airspace disease. Pharmacy has been consulted for Zosyn dosing, due to concerns for aspiration pneumonia.   Plan: Zosyn 3.375g IV q8h (4 hour infusion). F/U CXR 8/24 AM  F/U PCT  F/U de-escalation of abx/LOT   Height: 6\' 1"  (185.4 cm) Weight: 74.7 kg (164 lb 10.9 oz) IBW/kg (Calculated) : 79.9  Temp (24hrs), Avg:97.6 F (36.4 C), Min:97 F (36.1 C), Max:97.9 F (36.6 C)  Recent Labs  Lab 12/19/20 0031 12/20/20 0343 12/21/20 0150 12/22/20 0438 12/23/20 0238  WBC 9.5 17.2* 12.9* 19.2* 18.1*  CREATININE 1.87* 1.86* 1.75* 1.77* 1.50*    Estimated Creatinine Clearance: 36 mL/min (A) (by C-G formula based on SCr of 1.5 mg/dL (H)).    Allergies  Allergen Reactions   Nsaids     Due to afib   Tiotropium Bromide Monohydrate     Other reaction(s): ineffecticve   Umeclidinium-Vilanterol     Other reaction(s): ineffective    Antimicrobials this admission: 8/16 Vanc >8/18 8/16 Cefepime >>8/21 8/16 Flagyl >8/18 8/21 Zosyn >>8/22, 8/24>>   Microbiology results: 8/16 BCx: negative  8/16 UCx: >100,000 colonies Pseudomonas aeroginosa   8/17 MRSA PCR: not detected  Thank you for allowing pharmacy to be a part of this patient's care.  9/17, PharmD PGY-1 Acute Care Resident  12/23/2020 11:44 AM

## 2020-12-23 NOTE — Progress Notes (Signed)
  Speech Language Pathology Treatment: Dysphagia  Patient Details Name: Nathan Mitchell MRN: [id number] DOB: 01-30-1931 Today's Date: 12/23/2020 Time: 0867-6195 SLP Time Calculation (min) (ACUTE ONLY): 14 min  Assessment / Plan / Recommendation Clinical Impression  Pt was seen for dysphagia treatment. His mentation was improved compared to that noted during the initial evaluation and he responded appropriately to some of the SLP's questions. Pt was adequately alert for participation in the session. Cues were initially needed for pt to use the straw, but these were quickly faded. Pt tolerated puree solids and individual small sips of thin liquids without overt s/sx of aspiration, but coughing was noted with consecutive swallows of thin liquids via straw and with regular textures. Bolus awareness and oral clearance was [facility] for puree. Mastication of regular texture solids was inconsistent with cues and pieces were noted to fall from the oral cavity during bolus manipulation. Pt's NPO status will be maintained at this time with continued allowance of floor-stock purees, ice chips and small sips of water after oral care. Prognosis for advancement is judged to be good with further improvement in mentation since some of his deficits appear to be cognitively based. SLP will continue to follow pt.    HPI HPI: Pt is an 87 y/o male who presented to the ED with AMS, fatigue, and poor oral intake.  Found to have dyspnea and wheezing.  He has chronic foley from recent admission, and started on keflex for UTI on 8/12.  CT head showed stable Lt SDH.  CXR 8/17: No  acute findings. Dx possible sepsis, acute metabolic encephalopathy thought to be from sepsis and ICU delirium. PMH: Aortic Regurgitation, giant cell arteritis, optic neuropathy, COPD, A.Fib, HLD, GERD, Depression, TBI, thoracic aortic aneurysm.      SLP Plan  Continue with current plan of care       Recommendations  Diet recommendations: NPO (ice  chips, individual sips of water, and floor-stock purees allowed) Liquids provided via: Straw;Cup Medication Administration: Crushed with puree Supervision: Full supervision/cueing for compensatory strategies Compensations: Small sips/bites;Slow rate;Minimize environmental distractions Postural Changes and/or Swallow Maneuvers: Seated upright 90 degrees;Upright 30-60 min after meal                Oral Care Recommendations: Oral care QID;Oral care prior to ice chip/H20 Follow up Recommendations: Other (comment) (TBD) SLP Visit Diagnosis: Dysphagia, unspecified (R13.10) Plan: Continue with current plan of care       Nathan Cattell I. Vear Clock, MS, CCC-SLP Acute Rehabilitation Services Office number [phone] Pager [phone]                Scheryl Marten 12/23/2020, 9:25 AM

## 2020-12-24 DIAGNOSIS — A4152 Sepsis due to Pseudomonas: Secondary | ICD-10-CM | POA: Diagnosis not present

## 2020-12-24 DIAGNOSIS — G9341 Metabolic encephalopathy: Secondary | ICD-10-CM | POA: Diagnosis not present

## 2020-12-24 DIAGNOSIS — I4891 Unspecified atrial fibrillation: Secondary | ICD-10-CM | POA: Diagnosis not present

## 2020-12-24 DIAGNOSIS — N390 Urinary tract infection, site not specified: Secondary | ICD-10-CM | POA: Diagnosis not present

## 2020-12-24 LAB — CBC WITH DIFFERENTIAL/PLATELET
Abs Immature Granulocytes: 0.46 10*3/uL — ABNORMAL HIGH (ref 0.00–0.07)
Basophils Absolute: 0 10*3/uL (ref 0.0–0.1)
Basophils Relative: 0 %
Eosinophils Absolute: 0.1 10*3/uL (ref 0.0–0.5)
Eosinophils Relative: 1 %
HCT: 32.4 % — ABNORMAL LOW (ref 39.0–52.0)
Hemoglobin: 9.5 g/dL — ABNORMAL LOW (ref 13.0–17.0)
Immature Granulocytes: 3 %
Lymphocytes Relative: 9 %
Lymphs Abs: 1.4 10*3/uL (ref 0.7–4.0)
MCH: 27.3 pg (ref 26.0–34.0)
MCHC: 29.3 g/dL — ABNORMAL LOW (ref 30.0–36.0)
MCV: 93.1 fL (ref 80.0–100.0)
Monocytes Absolute: 0.9 10*3/uL (ref 0.1–1.0)
Monocytes Relative: 6 %
Neutro Abs: 12.1 10*3/uL — ABNORMAL HIGH (ref 1.7–7.7)
Neutrophils Relative %: 81 %
Platelets: 310 10*3/uL (ref 150–400)
RBC: 3.48 MIL/uL — ABNORMAL LOW (ref 4.22–5.81)
RDW: 19.5 % — ABNORMAL HIGH (ref 11.5–15.5)
WBC: 14.9 10*3/uL — ABNORMAL HIGH (ref 4.0–10.5)
nRBC: 0.2 % (ref 0.0–0.2)

## 2020-12-24 LAB — COMPREHENSIVE METABOLIC PANEL
ALT: 24 U/L (ref 0–44)
AST: 16 U/L (ref 15–41)
Albumin: 2.3 g/dL — ABNORMAL LOW (ref 3.5–5.0)
Alkaline Phosphatase: 38 U/L (ref 38–126)
Anion gap: 9 (ref 5–15)
BUN: 48 mg/dL — ABNORMAL HIGH (ref 8–23)
CO2: 19 mmol/L — ABNORMAL LOW (ref 22–32)
Calcium: 8.5 mg/dL — ABNORMAL LOW (ref 8.9–10.3)
Chloride: 118 mmol/L — ABNORMAL HIGH (ref 98–111)
Creatinine, Ser: 1.37 mg/dL — ABNORMAL HIGH (ref 0.61–1.24)
GFR, Estimated: 50 mL/min — ABNORMAL LOW (ref >60)
Glucose, Bld: 131 mg/dL — ABNORMAL HIGH (ref 70–99)
Potassium: 4.2 mmol/L (ref 3.5–5.1)
Sodium: 146 mmol/L — ABNORMAL HIGH (ref 135–145)
Total Bilirubin: 1 mg/dL (ref 0.3–1.2)
Total Protein: 5.8 g/dL — ABNORMAL LOW (ref 6.5–8.1)

## 2020-12-24 LAB — GLUCOSE, CAPILLARY
Glucose-Capillary: 109 mg/dL — ABNORMAL HIGH (ref 70–99)
Glucose-Capillary: 122 mg/dL — ABNORMAL HIGH (ref 70–99)
Glucose-Capillary: 126 mg/dL — ABNORMAL HIGH (ref 70–99)
Glucose-Capillary: 126 mg/dL — ABNORMAL HIGH (ref 70–99)
Glucose-Capillary: 138 mg/dL — ABNORMAL HIGH (ref 70–99)
Glucose-Capillary: 148 mg/dL — ABNORMAL HIGH (ref 70–99)

## 2020-12-24 LAB — PROCALCITONIN: Procalcitonin: <0.1 ng/mL

## 2020-12-24 MED ORDER — SODIUM CHLORIDE 0.9 % IV SOLN
2.0000 g | INTRAVENOUS | Status: DC
  Administered 2020-12-24 – 2020-12-25 (×2): 2 g via INTRAVENOUS
  Filled 2020-12-24 (×3): qty 20

## 2020-12-24 MED ORDER — SODIUM CHLORIDE 0.9 % IV SOLN: 3.0000 g | Freq: Three times a day (TID) | INTRAVENOUS | Status: DC

## 2020-12-24 MED ORDER — MORPHINE SULFATE (PF) 2 MG/ML IV SOLN
0.5000 mg | Freq: Once | INTRAVENOUS | Status: AC | PRN
  Administered 2020-12-25: 0.5 mg via INTRAVENOUS
  Filled 2020-12-24: qty 1

## 2020-12-24 MED ORDER — ENOXAPARIN SODIUM 40 MG/0.4ML IJ SOSY
40.0000 mg | PREFILLED_SYRINGE | INTRAMUSCULAR | Status: DC
  Administered 2020-12-24: 40 mg via SUBCUTANEOUS
  Filled 2020-12-24: qty 0.4

## 2020-12-24 NOTE — Plan of Care (Signed)

## 2020-12-24 NOTE — Progress Notes (Addendum)
Pharmacy Antibiotic Note-Initial Consult  Nathan Mitchell is a 87 y.o. male admitted on December 26, 2020 with AMS, fatigue and poor oral intake concerning for sepsis.  He has a chronic foley catheter from a previous admission, and urine culture showed Pseudomonas aeroginosa, for which the patient received a full course of antibiotics. Patient's mental status has not significantly improved over the course of the hospital stay (oriented to self only). Overnight 8/23, patient had emesis, leukocytosis, and 8/23 CXR concerning for worsening airspace disease. Zosyn was started 8/24 due to concern for aspiration pneumonia. Patient's PCT negative 8/25 AM, however he is still having thick secretions. Pharmacy has been consulted for switch from Zosyn to ceftriaxone.   Plan: STOP Zosyn  START Ceftriaxone 2g Q24H  F/U de-escalation of abx/LOT   Height: 6\' 1"  (185.4 cm) Weight: 74.7 kg (164 lb 10.9 oz) IBW/kg (Calculated) : 79.9  Temp (24hrs), Avg:97.4 F (36.3 C), Min:96.6 F (35.9 C), Max:97.9 F (36.6 C)  Recent Labs  Lab 12/20/20 0343 12/21/20 0150 12/22/20 0438 12/23/20 0238 12/24/20 0132  WBC 17.2* 12.9* 19.2* 18.1* 14.9*  CREATININE 1.86* 1.75* 1.77* 1.50* 1.37*     Estimated Creatinine Clearance: 39.4 mL/min (A) (by C-G formula based on SCr of 1.37 mg/dL (H)).    Allergies  Allergen Reactions   Nsaids     Due to afib   Tiotropium Bromide Monohydrate     Other reaction(s): ineffecticve   Umeclidinium-Vilanterol     Other reaction(s): ineffective    Antimicrobials this admission: 8/16 Vanc >8/18 8/16 Cefepime >>8/21 8/16 Flagyl >8/18 8/21 Zosyn >>8/22, 8/24>> 8/25 8/25 ceftriaxone>>  Microbiology results: 8/16 BCx: negative  8/16 UCx: >100,000 colonies Pseudomonas aeroginosa   8/17 MRSA PCR: not detected  Thank you for allowing pharmacy to be a part of this patient's care.  9/17, PharmD PGY-1 Acute Care Resident  12/24/2020 10:25 AM

## 2020-12-24 NOTE — Plan of Care (Signed)
Asked to see patient for agitation, appears that he has been dealing with ICU delirium and sundowning this admission.  On exam he is laying in bed, awake and c/o gluteal pain (RN reports area of mild skin irritation) and requiring mittens to keep from pulling O2 off.   Not severely agitated at this time and vitals are stable.   He has prolonged Qtc, so will avoid further prolonging agents.  He got 0.5mg  Morphine last night and did not end up needing Zyprexa, so will order another 0.5mg  Morphine and see if this helps.  Darcella Gasman Misa Fedorko, PA-C

## 2020-12-24 NOTE — Progress Notes (Signed)
NAME:  RIGGS DINEEN, MRN:  [id number], DOB:  1930-09-29, LOS: 8 ADMISSION DATE:  12/21/2020, CONSULTATION DATE:  12/16/2020 REFERRING MD:  Dr. Roda Shutters, CHIEF COMPLAINT:  Encephalopathy   History of Present Illness:  87 y/o male who presented to ED with AMS, fatigue, and poor oral intake.  Found to have dyspnea and wheezing.  He has chronic foley from recent admission, and started on keflex for UTI on 12/11/20.  CT head showed stable Lt SDH.  He was started on ABx for possible sepsis and zyprexa for delirium.  He was in hospital in June 2022 for femur fracture and large Lt SDH.   Pertinent  Medical History  Aortic Regurgitation, giant cell arteritis, optic neuropathy, COPD, A.Fib, HLD, GERD, Depression, TBI, thoracic aortic aneurysm  Significant Hospital Events:  8/16 Admitted 8/19 back to ICU, start precedex 8/20 precedex held due to lethargy 8/21 progressive agitation >> precedex restarted. 8/22 on precedex, more calm 8/23 Remains on precedex, agitated overnight.  Precedex stopped during day.   Interim History / Subjective:  Agitated and AF RVR overnight.  Improved this morning.   Objective   Blood pressure (!) 149/109, pulse (!) 104, temperature 97.8 F (36.6 C), temperature source Axillary, resp. rate (!) 23, height 6\' 1"  (1.854 m), weight 74.7 kg, SpO2 97 %.        Intake/Output Summary (Last 24 hours) at 12/24/2020 0913 Last data filed at 12/24/2020 0800 Gross per 24 hour  Intake 1229.71 ml  Output 1545 ml  Net -315.29 ml    Filed Weights   12/21/20 0500 12/22/20 0500 12/23/20 0500  Weight: 74.7 kg 74.7 kg 74.7 kg   Examination:  General: Frail elderly male in NAD HEENT: [place]/AT, PERRL, NGT in place . Neuro: Alert, oriented, non-focal.  CV: RRR, no MRG PULM: Unlabored, clear bilateral breath sounds GI: Soft, diffusely tender, non-distended.  Extremities: no acute deformity. No Edema.  Skin: grossly intact.   Resolved Hospital Problem list     Assessment & Plan:    Acute metabolic encephalopathy likely from sepsis and ICU delirium. Seems to have sundowning features.  Lt SDH from June 2022. Hx of depression. -minimize sedating medications -continue home lexapro -prednisone to 20 mg QD PT in lieu of methotrexate  -continue home B12, melatonin, folic acid  -Needs sleep/wake cycle reestablished.  Emesis with Concern for Aspiration  8/23 CXR with concern for worsening airspace disease  - zosyn for empiric aspiration coverage started 8/24 - PCT low, but he is having productive cough, will leave zosyn for at least another 24 hours.  -guaifenesin for secretions  UTI with chronic foley and Pseudomonas aeruginosa in urine culture from 12/10/2020. Completed 7 days abx 8/22 -monitor for new symptoms  -foley in place for retention   AKI from ATN 2nd to sepsis. Hypernatremia Urine retention. -free water to 200 Q4  -resume flomax when able to take PO's    -Trend BMP / urinary output -Replace electrolytes as indicated  Constipation  -add simethicone, senakot S  Hx of COPD. -continue pulmicort, brovana, yupelri  -PRN albuterol   AFwRVR Hx of A fib, nonsustained VT.  Prolonged Qtc. Recent SDH, not candidate for anticoagulation  - tele monitoring  - PRN lopressor   Anemia of critical illness and chronic disease. -follow CBC, transfuse for Hgb <7%  Hx of giant cell arteritis. -hold home methotrexate  -reduce steroids as above with delirium   Dysphagia Emesis 8/24 -appreciate SLP  -continue cortrak  -TF per dietary goal  Seems stable for transfer out of ICU today. Will ask TR[facility] to assume care tomorrow.  Best Practice (right click and "Reselect all SmartList Selections" daily)  Diet/type: NPO DVT prophylaxis: Lovenox  GI prophylaxis: Protonix Code Status:  full code Last date of multidisciplinary goals of care discussion:  pending.    Critical care time: 34 minutes    Joneen Roach, AGACNP-BC [facility]  See  Amion for personal pager PCCM on call pager [phone] until 7pm. Please call Elink 7p-7a. [phone]  12/24/2020 9:14 AM

## 2020-12-25 ENCOUNTER — Inpatient Hospital Stay (HOSPITAL_COMMUNITY): Payer: Medicare PPO

## 2020-12-25 DIAGNOSIS — I4811 Longstanding persistent atrial fibrillation: Secondary | ICD-10-CM

## 2020-12-25 DIAGNOSIS — A4152 Sepsis due to Pseudomonas: Secondary | ICD-10-CM | POA: Diagnosis not present

## 2020-12-25 DIAGNOSIS — J42 Unspecified chronic bronchitis: Secondary | ICD-10-CM | POA: Diagnosis not present

## 2020-12-25 DIAGNOSIS — N390 Urinary tract infection, site not specified: Secondary | ICD-10-CM | POA: Diagnosis not present

## 2020-12-25 LAB — CBC WITH DIFFERENTIAL/PLATELET
Abs Immature Granulocytes: 0.5 10*3/uL — ABNORMAL HIGH (ref 0.00–0.07)
Basophils Absolute: 0.1 10*3/uL (ref 0.0–0.1)
Basophils Relative: 0 %
Eosinophils Absolute: 0.1 10*3/uL (ref 0.0–0.5)
Eosinophils Relative: 1 %
HCT: 32.3 % — ABNORMAL LOW (ref 39.0–52.0)
Hemoglobin: 9.5 g/dL — ABNORMAL LOW (ref 13.0–17.0)
Immature Granulocytes: 3 %
Lymphocytes Relative: 7 %
Lymphs Abs: 1.2 10*3/uL (ref 0.7–4.0)
MCH: 26.8 pg (ref 26.0–34.0)
MCHC: 29.4 g/dL — ABNORMAL LOW (ref 30.0–36.0)
MCV: 91 fL (ref 80.0–100.0)
Monocytes Absolute: 0.8 10*3/uL (ref 0.1–1.0)
Monocytes Relative: 5 %
Neutro Abs: 13.8 10*3/uL — ABNORMAL HIGH (ref 1.7–7.7)
Neutrophils Relative %: 84 %
Platelets: 343 10*3/uL (ref 150–400)
RBC: 3.55 MIL/uL — ABNORMAL LOW (ref 4.22–5.81)
RDW: 19.4 % — ABNORMAL HIGH (ref 11.5–15.5)
WBC: 16.5 10*3/uL — ABNORMAL HIGH (ref 4.0–10.5)
nRBC: 0.3 % — ABNORMAL HIGH (ref 0.0–0.2)

## 2020-12-25 LAB — COMPREHENSIVE METABOLIC PANEL
ALT: 25 U/L (ref 0–44)
AST: 17 U/L (ref 15–41)
Albumin: 2.4 g/dL — ABNORMAL LOW (ref 3.5–5.0)
Alkaline Phosphatase: 43 U/L (ref 38–126)
Anion gap: 7 (ref 5–15)
BUN: 37 mg/dL — ABNORMAL HIGH (ref 8–23)
CO2: 23 mmol/L (ref 22–32)
Calcium: 8.7 mg/dL — ABNORMAL LOW (ref 8.9–10.3)
Chloride: 114 mmol/L — ABNORMAL HIGH (ref 98–111)
Creatinine, Ser: 1.16 mg/dL (ref 0.61–1.24)
GFR, Estimated: >60 mL/min (ref >60)
Glucose, Bld: 125 mg/dL — ABNORMAL HIGH (ref 70–99)
Potassium: 3.8 mmol/L (ref 3.5–5.1)
Sodium: 144 mmol/L (ref 135–145)
Total Bilirubin: 0.6 mg/dL (ref 0.3–1.2)
Total Protein: 6.1 g/dL — ABNORMAL LOW (ref 6.5–8.1)

## 2020-12-25 LAB — GLUCOSE, CAPILLARY
Glucose-Capillary: 125 mg/dL — ABNORMAL HIGH (ref 70–99)
Glucose-Capillary: 127 mg/dL — ABNORMAL HIGH (ref 70–99)
Glucose-Capillary: 144 mg/dL — ABNORMAL HIGH (ref 70–99)
Glucose-Capillary: 109 mg/dL — ABNORMAL HIGH (ref 70–99)
Glucose-Capillary: 136 mg/dL — ABNORMAL HIGH (ref 70–99)
Glucose-Capillary: 124 mg/dL — ABNORMAL HIGH (ref 70–99)

## 2020-12-25 LAB — MAGNESIUM: Magnesium: 2 mg/dL (ref 1.7–2.4)

## 2020-12-25 LAB — PHOSPHORUS: Phosphorus: 3.5 mg/dL (ref 2.5–4.6)

## 2020-12-25 LAB — PROCALCITONIN: Procalcitonin: <0.1 ng/mL

## 2020-12-25 MED ORDER — HALOPERIDOL LACTATE 5 MG/ML IJ SOLN
2.0000 mg | Freq: Four times a day (QID) | INTRAMUSCULAR | Status: DC | PRN
  Administered 2020-12-26: 2 mg via INTRAVENOUS
  Filled 2020-12-25: qty 1

## 2020-12-25 MED ORDER — MORPHINE SULFATE (PF) 2 MG/ML IV SOLN
0.5000 mg | Freq: Once | INTRAVENOUS | Status: AC | PRN
  Administered 2020-12-25: 0.5 mg via INTRAVENOUS
  Filled 2020-12-25: qty 1

## 2020-12-25 MED ORDER — MORPHINE SULFATE (PF) 2 MG/ML IV SOLN
2.0000 mg | INTRAVENOUS | Status: DC | PRN
Start: 2020-12-25 — End: 2020-12-27
  Administered 2020-12-25 – 2020-12-27 (×9): 2 mg via INTRAVENOUS
  Filled 2020-12-25 (×9): qty 1

## 2020-12-25 MED ORDER — MORPHINE SULFATE (PF) 2 MG/ML IV SOLN: 0.5000 mg | INTRAVENOUS | Status: DC | PRN

## 2020-12-25 NOTE — Plan of Care (Signed)
  Problem: Nutrition: Goal: Adequate nutrition will be maintained Outcome: Progressing   

## 2020-12-25 NOTE — Progress Notes (Signed)
Pt. Attempting to get out of bed said he is looking for his son, reoriented pt that he is in the hospital. PT having labored breathing resp rate in the 30's with apneic episodes. Coughing up small amount of bloody sputum. Heart rate in the 130-140's. PRN morphine and lopressor given.  MD made aware. EKG ordered.

## 2020-12-25 NOTE — Progress Notes (Addendum)
PROGRESS NOTE  SONY SCHLARB III FBP:[id number] DOB: 05-08-1930 DOA: 01-02-2021 PCP: Merlene Laughter, MD   LOS: 9 days   Brief narrative: 87 y/o male who presented to to the emergency department with altered mental status, fatigue, poor oral intake.  Patient was noted to be having dyspnea and wheezing.  Patient does have a chronic Foley from recent admission and was started on Keflex for UTI on 12/11/2020.  CT head showed stable left subdural hematoma.  Patient was started on antibiotic for possible sepsis and  zyprexa for delirium.  Patient was in the hospital in June 2022 for femur fracture and subdural hematoma.  Assessment/Plan:  Principal Problem:   Sepsis (HCC) Active Problems:   Atrial fibrillation (HCC)   Chronic obstructive pulmonary disease, unspecified (HCC)   Dyslipidemia   Gastroesophageal reflux disease   Major depression in remission (HCC)   TBI (traumatic brain injury) (HCC)   Acute lower UTI   Acute metabolic encephalopathy  Acute metabolic encephalopathy likely from sepsis and ICU delirium.  Patient continues to be agitated confused and disoriented.  Patient has prolonged QTC.  I had a prolonged discussion with the patient's son at bedside.  We will try morphine and antipsychotics if necessary as per the patient's son who is also physician.  Discussed about the prolonged QTC and possibility of ventricular arrhythmia but at this time, the focus will be more on comfort.  Patient's son is aware of this and is in agreement.  History of left subdural hematoma in June 2022.   Patient had hip fracture after which patient's condition has since fell down.  Hx of depression. Was on Lexapro.   Emesis with Concern for Aspiration  Chest x-ray shows a worsening aspirate.  Patient was on Zosyn initially which has been changed to Rocephin IV.  Continue suctioning albuterol nebulizer oxygen.   UTI with chronic foley and Pseudomonas aeruginosa in urine culture from  2021-01-02. Completed 7 days abx 8/22.  Continue Foley catheter for urinary retention.  AKI from acute tubular necrosis secondary to sepsis, hypernatremia, urinary retention On free water flushes.  Flomax on hold at this time.  Creatinine of 1.1.   Constipation  Tinea supportive care.   Hx of COPD. On pulmicort, brovana, yupelri  -PRN albuterol and nebulizer frequently.   Atrial fibrillation with RVR.  Now with prolonged QTC. Continue to monitor electrolytes.  As needed metoprolol.     Anemia of critical illness and chronic disease. Currently hemoglobin of 9.5   Hx of giant cell arteritis. Methotrexate on hold.  Steroids have been decreased secondary to delirium.   Dysphagia/Emesis 8/24 Only on cortrak tube tube.  Patient has been seen by speech therapy and consider patient to be n.p.o.  Ethics/goals of care.  We will also get palliative care consultation to assist in goals of care.  DVT prophylaxis:   SCD for now   Code Status: Full code.  Family Communication: Spoke with the patient's son at bedside.  Had a prolonged discussion about goals of care.  Patient's son requested that patient be comfortable but continues her current level of care.  He is going to discuss with the rest of the family about further goals of care including CODE STATUS  Status is: Inpatient  Remains inpatient appropriate because:Unsafe d/c plan, IV treatments appropriate due to intensity of illness or inability to take PO, and Inpatient level of care appropriate due to severity of illness  Dispo:  Patient From: Home  Planned Disposition: To be determined  Medically stable for discharge: No      Consultants: PCCM Palliative care  Procedures: Cortrak tube tube placement  Anti-infectives:  Rocephin IV  Anti-infectives (From admission, onward)    Start     Dose/Rate Route Frequency Ordered Stop   01/08/21 0600  ceFAZolin (ANCEF) IVPB 2g/100 mL premix  Status:  Discontinued        2 g 200  mL/hr over 30 Minutes Intravenous  Once 12/16/20 1948 12/16/20 1959   12/24/20 1400  cefTRIAXone (ROCEPHIN) 2 g in sodium chloride 0.9 % 100 mL IVPB        2 g 200 mL/hr over 30 Minutes Intravenous Every 24 hours 12/24/20 1035     12/24/20 1200  Ampicillin-Sulbactam (UNASYN) 3 g in sodium chloride 0.9 % 100 mL IVPB  Status:  Discontinued        3 g 200 mL/hr over 30 Minutes Intravenous Every 8 hours 12/24/20 1024 12/24/20 1035   12/23/20 1200  piperacillin-tazobactam (ZOSYN) IVPB 3.375 g  Status:  Discontinued        3.375 g 12.5 mL/hr over 240 Minutes Intravenous Every 8 hours 12/23/20 1133 12/24/20 1024   12/20/20 1430  piperacillin-tazobactam (ZOSYN) IVPB 3.375 g        3.375 g 12.5 mL/hr over 240 Minutes Intravenous Every 8 hours 12/20/20 1337 12/21/20 2359   12/17/20 1500  vancomycin (VANCOREADY) IVPB 1750 mg/350 mL  Status:  Discontinued        1,750 mg 175 mL/hr over 120 Minutes Intravenous Every 48 hours January 04, 2021 1539 12/17/20 1024   12/17/20 1400  cefTRIAXone (ROCEPHIN) 2 g in sodium chloride 0.9 % 100 mL IVPB  Status:  Discontinued        2 g 200 mL/hr over 30 Minutes Intravenous Every 24 hours 12/17/20 1024 12/17/20 1226   12/17/20 1400  ceFEPIme (MAXIPIME) 2 g in sodium chloride 0.9 % 100 mL IVPB  Status:  Discontinued        2 g 200 mL/hr over 30 Minutes Intravenous Every 12 hours 12/17/20 1226 12/20/20 1332   12/16/20 2030  ceFAZolin (ANCEF) IVPB 2g/100 mL premix  Status:  Discontinued        2 g 200 mL/hr over 30 Minutes Intravenous  Once 12/16/20 1943 12/16/20 1948   12/16/20 0200  ceFEPIme (MAXIPIME) 2 g in sodium chloride 0.9 % 100 mL IVPB  Status:  Discontinued        2 g 200 mL/hr over 30 Minutes Intravenous Every 12 hours 01-04-21 1539 12/17/20 1024   01/04/21 2200  metroNIDAZOLE (FLAGYL) IVPB 500 mg  Status:  Discontinued        500 mg 100 mL/hr over 60 Minutes Intravenous Every 8 hours 01/04/21 1953 12/17/20 1024   01/04/21 1415  ceFEPIme (MAXIPIME) 2 g in sodium  chloride 0.9 % 100 mL IVPB        2 g 200 mL/hr over 30 Minutes Intravenous  Once 01-04-21 1402 2021/01/04 1528   2021/01/04 1415  metroNIDAZOLE (FLAGYL) IVPB 500 mg        500 mg 100 mL/hr over 60 Minutes Intravenous  Once Jan 04, 2021 1402 Jan 04, 2021 1647   01-04-21 1415  vancomycin (VANCOREADY) IVPB 1500 mg/300 mL        1,500 mg 150 mL/hr over 120 Minutes Intravenous  Once 2021/01/04 1402 01/04/2021 1900       Subjective: Today, patient was seen and examined at bedside.  Has been struggling this morning with breathing with tachypnea tachycardia agitation restlessness and confusion.  Patient's son at bedside.  Nursing staff also reported that he was coughing up some blood.  Objective: Vitals:   12/25/20 1200 12/25/20 1304  BP: (!) 153/91   Pulse: (!) 101   Resp: 19   Temp: 98.7 F (37.1 C)   SpO2: 96% 96%    Intake/Output Summary (Last 24 hours) at 12/25/2020 1335 Last data filed at 12/25/2020 0553 Gross per 24 hour  Intake 1511.3 ml  Output 910 ml  Net 601.3 ml   Filed Weights   12/22/20 0500 12/23/20 0500 12/25/20 0848  Weight: 74.7 kg 74.7 kg 78.1 kg   Body mass index is 22.72 kg/m.   Physical Exam:  GENERAL: Patient is is delirious agitated confused and frail elderly in moderate distress on nasal cannula oxygen  HENT: No scleral pallor or icterus. Pupils equally reactive to light. Oral mucosa is moist, upper airway sounds noted. NECK: is supple, no gross swelling noted. CHEST: Coarse breath sounds noted bilaterally, diminished breath sounds.  Rhonchi noted. CVS: S1 and S2 heard, no murmur. Regular rate and rhythm.  ABDOMEN: Soft, non-tender, bowel sounds are present. EXTREMITIES: No edema. CNS: Delirious, moving all extremities on restraints, agitated at times, SKIN: warm and dry without rashes.  Data Review: I have personally reviewed the following laboratory data and studies,  CBC: Recent Labs  Lab 12/21/20 0150 12/22/20 0438 12/23/20 0238 12/24/20 0132  12/25/20 0308  WBC 12.9* 19.2* 18.1* 14.9* 16.5*  NEUTROABS 11.4* 16.7* 14.6* 12.1* 13.8*  HGB 8.5* 8.9* 8.8* 9.5* 9.5*  HCT 27.2* 29.3* 29.6* 32.4* 32.3*  MCV 89.5 90.2 90.5 93.1 91.0  PLT 250 348 372 310 343   Basic Metabolic Panel: Recent Labs  Lab 12/21/20 0150 12/21/20 1701 12/22/20 0438 12/22/20 1658 12/23/20 0238 12/24/20 0132 12/25/20 0308  NA 146*  --  149*  --  149* 146* 144  K 4.6  --  4.3  --  4.1 4.2 3.8  CL 118*  --  117*  --  121* 118* 114*  CO2 18*  --  21*  --  22 19* 23  GLUCOSE 125*  --  135*  --  126* 131* 125*  BUN 45*  --  59*  --  58* 48* 37*  CREATININE 1.75*  --  1.77*  --  1.50* 1.37* 1.16  CALCIUM 8.3*  --  8.7*  --  8.6* 8.5* 8.7*  MG  --  2.4 2.5* 2.3 2.3  --  2.0  PHOS  --  5.4* 4.4 3.8 3.6  --  3.5   Liver Function Tests: Recent Labs  Lab 12/21/20 0150 12/22/20 0438 12/23/20 0238 12/24/20 0132 12/25/20 0308  AST 24 22 20 16 17   ALT 21 22 24 24 25   ALKPHOS 46 43 38 38 43  BILITOT 0.9 1.0 0.8 1.0 0.6  PROT 6.0* 6.0* 5.9* 5.8* 6.1*  ALBUMIN 2.3* 2.4* 2.4* 2.3* 2.4*   No results for input(s): LIPASE, AMYLASE in the last 168 hours. No results for input(s): AMMONIA in the last 168 hours. Cardiac Enzymes: No results for input(s): CKTOTAL, CKMB, CKMBINDEX, TROPONINI in the last 168 hours. BNP (last 3 results) Recent Labs    2020-05-29 1426 12/16/20 1624 12/23/20 0238  BNP 344.5* 247.5* 704.3*    ProBNP (last 3 results) No results for input(s): PROBNP in the last 8760 hours.  CBG: Recent Labs  Lab 12/24/20 2016 12/24/20 2355 12/25/20 0429 12/25/20 0837 12/25/20 1224  GLUCAP 126* 122* 125* 127* 144*   Recent Results (from the  past 240 hour(s))  Resp Panel by RT-PCR (Flu A&B, Covid) Nasopharyngeal Swab     Status: None   Collection Time: 12/17/2020  2:02 PM   Specimen: Nasopharyngeal Swab; Nasopharyngeal(NP) swabs in vial transport medium  Result Value Ref Range Status   SARS Coronavirus 2 by RT PCR NEGATIVE NEGATIVE Final     Comment: (NOTE) SARS-CoV-2 target nucleic acids are NOT DETECTED.  The SARS-CoV-2 RNA is generally detectable in upper respiratory specimens during the acute phase of infection. The lowest concentration of SARS-CoV-2 viral copies this assay can detect is 138 copies/mL. A negative result does not preclude SARS-Cov-2 infection and should not be used as the sole basis for treatment or other patient management decisions. A negative result may occur with  improper specimen collection/handling, submission of specimen other than nasopharyngeal swab, presence of viral mutation(s) within the areas targeted by this assay, and inadequate number of viral copies(<138 copies/mL). A negative result must be combined with clinical observations, patient history, and epidemiological information. The expected result is Negative.  Fact Sheet for Patients:  BloggerCourse.com  Fact Sheet for Healthcare Providers:  SeriousBroker.it  This test is no t yet approved or cleared by the [place] FDA and  has been authorized for detection and/or diagnosis of SARS-CoV-2 by FDA under an Emergency Use Authorization (EUA). This EUA will remain  in effect (meaning this test can be used) for the duration of the COVID-19 declaration under Section 564(b)(1) of the Act, 21 U.S.C.section 360bbb-3(b)(1), unless the authorization is terminated  or revoked sooner.       Influenza A by PCR NEGATIVE NEGATIVE Final   Influenza B by PCR NEGATIVE NEGATIVE Final    Comment: (NOTE) The Xpert Xpress SARS-CoV-2/FLU/RSV plus assay is intended as an aid in the diagnosis of influenza from Nasopharyngeal swab specimens and should not be used as a sole basis for treatment. Nasal washings and aspirates are unacceptable for Xpert Xpress SARS-CoV-2/FLU/RSV testing.  Fact Sheet for Patients: BloggerCourse.com  Fact Sheet for Healthcare  Providers: SeriousBroker.it  This test is not yet approved or cleared by the [place] FDA and has been authorized for detection and/or diagnosis of SARS-CoV-2 by FDA under an Emergency Use Authorization (EUA). This EUA will remain in effect (meaning this test can be used) for the duration of the COVID-19 declaration under Section 564(b)(1) of the Act, 21 U.S.C. section 360bbb-3(b)(1), unless the authorization is terminated or revoked.  Performed at [facility], [address] [place], [place] [address]   Urine Culture     Status: Abnormal   Collection Time: 12/19/2020  2:02 PM   Specimen: In/Out Cath Urine  Result Value Ref Range Status   Specimen Description IN/OUT CATH URINE  Final   Special Requests   Final    NONE Performed at [facility], [address], [place], [place] [address]    Culture >=100,000 COLONIES/mL PSEUDOMONAS AERUGINOSA (A)  Final   Report Status 12/18/2020 FINAL  Final   Organism ID, Bacteria PSEUDOMONAS AERUGINOSA (A)  Final      Susceptibility   Pseudomonas aeruginosa - MIC*    CEFTAZIDIME 4 SENSITIVE Sensitive     CIPROFLOXACIN <=0.25 SENSITIVE Sensitive     GENTAMICIN 4 SENSITIVE Sensitive     IMIPENEM 2 SENSITIVE Sensitive     PIP/TAZO 8 SENSITIVE Sensitive     CEFEPIME 4 SENSITIVE Sensitive     * >=100,000 COLONIES/mL PSEUDOMONAS AERUGINOSA  Blood Culture (routine x 2)     Status:  None   Collection Time: Dec 28, 2020  2:45 PM   Specimen: BLOOD  Result Value Ref Range Status   Specimen Description BLOOD SITE NOT SPECIFIED  Final   Special Requests   Final    BOTTLES DRAWN AEROBIC AND ANAEROBIC Blood Culture results may not be optimal due to an inadequate volume of blood received in culture bottles   Culture   Final    NO GROWTH 5 DAYS Performed at [facility], [address] [place], [place] [address]    Report Status 12/20/2020 FINAL  Final  Blood Culture (routine x 2)     Status: None    Collection Time: 28-Dec-2020  2:50 PM   Specimen: BLOOD  Result Value Ref Range Status   Specimen Description BLOOD SITE NOT SPECIFIED  Final   Special Requests   Final    BOTTLES DRAWN AEROBIC AND ANAEROBIC Blood Culture adequate volume   Culture   Final    NO GROWTH 5 DAYS Performed at [facility], [address], [place], [place] [address]    Report Status 12/20/2020 FINAL  Final  MRSA Next Gen by PCR, Nasal     Status: None   Collection Time: 12/16/20  4:43 PM   Specimen: Nasal Mucosa; Nasal Swab  Result Value Ref Range Status   MRSA by PCR Next Gen NOT DETECTED NOT DETECTED Final    Comment: (NOTE) The GeneXpert MRSA Assay (FDA approved for NASAL specimens only), is one component of a comprehensive MRSA colonization surveillance program. It is not intended to diagnose MRSA infection nor to guide or monitor treatment for MRSA infections. Test performance is not FDA approved in patients less than 13 years old. Performed at [facility], [address] [place], [place] [address]      Studies: No results found.    Joycelyn Das, MD  [facility] 12/25/2020  If 7PM-7AM, please contact night-coverage

## 2020-12-25 NOTE — Progress Notes (Signed)
  Speech Language Pathology Treatment: Dysphagia  Patient Details Name: Nathan Mitchell MRN: [id number] DOB: April 28, 1931 Today's Date: 12/25/2020 Time: 3710-6269 SLP Time Calculation (min) (ACUTE ONLY): 16 min  Assessment / Plan / Recommendation Clinical Impression  Pt was seen for dysphagia treatment. He was alert and better able to attend and participate in conversation than during prior sessions. Pt's RR was 37-41 upon SLP's arrival, and improved to <30 when trials were presented. However, RR was noted to intermittently increase to up to 43 during the session and WOB was increased compared to when he was last seen in the ICU by this SLP on 8/24. Pt's case was discussed with Amrah, RN who reported that the pt's respiratory status has been like this since last night and that the MD has been made aware. Pt tolerated puree solids without overt s/sx of aspiration, but exhibited coughing with thin liquids and nectar thick liquids. Considering pt's current respiratory status, SLP questions dyssynchrony of respiration and deglutition. A modified barium swallow study is recommended to further assess swallow function, but will be deferred at this time in hopes of improvement of respiratory status to closer to that noted during prior sessions. SLP will follow up next week.   HPI HPI: Pt is an 87 y/o male who presented to the ED with AMS, fatigue, and poor oral intake.  Found to have dyspnea and wheezing.  He has chronic foley from recent admission, and started on keflex for UTI on 8/12.  CT head showed stable Lt SDH.  CXR 8/17: No  acute findings. Dx possible sepsis, acute metabolic encephalopathy thought to be from sepsis and ICU delirium. PMH: Aortic Regurgitation, giant cell arteritis, optic neuropathy, COPD, A.Fib, HLD, GERD, Depression, TBI, thoracic aortic aneurysm.      SLP Plan  Continue with current plan of care       Recommendations  Diet recommendations: NPO (ice chips and floor-stock  purees allowed if RR <30) Medication Administration: Via alternative means Supervision: Full supervision/cueing for compensatory strategies Compensations: Slow rate;Small sips/bites Postural Changes and/or Swallow Maneuvers: Seated upright 90 degrees;Upright 30-60 min after meal                Oral Care Recommendations: Oral care QID;Oral care prior to ice chip/H20 Follow up Recommendations: Other (comment) (TBD) SLP Visit Diagnosis: Dysphagia, unspecified (R13.10) Plan: Continue with current plan of care       Nathan Ainley I. Vear Clock, MS, CCC-SLP Acute Rehabilitation Services Office number [phone] Pager [phone]                Scheryl Marten 12/25/2020, 9:47 AM

## 2020-12-25 NOTE — Care Management Important Message (Signed)
Important Message  Patient Details  Name: Nathan Mitchell MRN: [id number] Date of Birth: Nov 09, 1930   Medicare Important Message Given:  Yes     Lisbeth Puller Stefan Church 12/25/2020, 4:03 PM

## 2020-12-25 NOTE — Plan of Care (Signed)

## 2020-12-25 NOTE — Progress Notes (Signed)
Physical Therapy Treatment Patient Details Name: Nathan Mitchell MRN: [id number] DOB: [birth year] Today's Date: 12/25/2020    History of Present Illness 87 yo male presents to [facility] on 8/12 and again 8/16 with fever, AMS. Workup for sepsis secondary to UTI, metabolic encephalopathy. PMH includes: afib, IM nail of R femur and burr hole placement (L SDH) on 10/08/2020 due to fall, giant cell arteritis, osteopenia, thoracic aortic aneurysm, former smoker.    PT Comments    Pt remains confused, but is pleasant throughout session and follows one-step commands consistently. Pt requiring mod +2 assist for bed mobility and transfer to stand x2, PT recommending to RN staff pt up to chair over the weekend when family is present if possible. Pt with periods of O2 desaturation and tachypnea, improved with rest and verbal/tactile cues to breathe in through nose. PT to continue to follow.   SpO2 82-98% on 4LO2, HR 130s with activity, RRmax 37 breaths/min    Follow Up Recommendations  SNF;Supervision/Assistance - 24 hour     Equipment Recommendations  None recommended by PT    Recommendations for Other Services       Precautions / Restrictions Precautions Precautions: Fall Precaution Comments: handmitts, cortrak Restrictions Weight Bearing Restrictions: No    Mobility  Bed Mobility Overal bed mobility: Needs Assistance Bed Mobility: Supine to Sit;Sit to Supine;Rolling Rolling: Mod assist   Supine to sit: Mod assist;+2 for physical assistance Sit to supine: Mod assist;+2 for physical assistance   General bed mobility comments: mod +2 for trunk and LE management, scooting to/from EOB, boost up in bed upon return to supine. Rolling bilat for pericare, pt with BM.    Transfers Overall transfer level: Needs assistance Equipment used: 2 person hand held assist Transfers: Sit to/from Stand Sit to Stand: Mod assist;+2 physical assistance;From elevated surface         General transfer  comment: Mod +2 for rise, hip extension via posterior facilitation, LE extension assist by PT LE, and steadying. STS x2 from EOB.  Ambulation/Gait             General Gait Details: nt   Stairs             Wheelchair Mobility    Modified Rankin (Stroke Patients Only)       Balance Overall balance assessment: Needs assistance;History of Falls Sitting-balance support: No upper extremity supported Sitting balance-Leahy Scale: Fair Sitting balance - Comments: able to sit EOB without PT assist -x6 minutes   Standing balance support: Bilateral upper extremity supported;During functional activity Standing balance-Leahy Scale: Poor Standing balance comment: reliant on external support                            Cognition Arousal/Alertness: Awake/alert Behavior During Therapy: WFL for tasks assessed/performed Overall Cognitive Status: Impaired/Different from baseline Area of Impairment: Orientation;Attention;Memory;Following commands;Safety/judgement;Problem solving                 Orientation Level: Disoriented to;Time;Situation Current Attention Level: Focused Memory: Decreased short-term memory Following Commands: Follows one step commands inconsistently;Follows one step commands with increased time Safety/Judgement: Decreased awareness of deficits;Decreased awareness of safety   Problem Solving: Slow processing;Decreased initiation;Difficulty sequencing;Requires verbal cues;Requires tactile cues General Comments: pt oriented to [facility], does not state his name when asked. Pt requires frequent, one-step cues for mobility and command following.      Exercises General Exercises - Lower Extremity Long Arc Quad: AROM;Both;Seated (x7 bilat)  General Comments General comments (skin integrity, edema, etc.): SpO2 82-98% on 4LO2, HR 130s with activity, RRmax 37 breaths/min      Pertinent Vitals/Pain Pain Assessment: No/denies pain    Home  Living                      Prior Function            PT Goals (current goals can now be found in the care plan section) Acute Rehab PT Goals Patient Stated Goal: pt be comfortable PT Goal Formulation: With family Time For Goal Achievement: 01/05/21 Potential to Achieve Goals: Good Progress towards PT goals: Progressing toward goals    Frequency    Min 2X/week      PT Plan Current plan remains appropriate    Co-evaluation              AM-PAC PT "6 Clicks" Mobility   Outcome Measure  Help needed turning from your back to your side while in a flat bed without using bedrails?: A Lot Help needed moving from lying on your back to sitting on the side of a flat bed without using bedrails?: A Lot Help needed moving to and from a bed to a chair (including a wheelchair)?: A Lot Help needed standing up from a chair using your arms (e.g., wheelchair or bedside chair)?: A Lot Help needed to walk in hospital room?: A Lot Help needed climbing 3-5 steps with a railing? : Total 6 Click Score: 11    End of Session Equipment Utilized During Treatment: Oxygen (4LO2) Activity Tolerance: Patient limited by fatigue;Treatment limited secondary to medical complications (Comment) (HR, RR, pt fatigeu) Patient left: in bed;with call bell/phone within reach;with bed alarm set;with family/visitor present;with restraints reapplied (mitts) Nurse Communication: Mobility status PT Visit Diagnosis: Other abnormalities of gait and mobility (R26.89);Muscle weakness (generalized) (M62.81)     Time: 1405-1430 PT Time Calculation (min) (ACUTE ONLY): 25 min  Charges:  $Therapeutic Activity: 8-22 mins $Neuromuscular Re-education: 8-22 mins                     Marye Round, PT DPT Acute Rehabilitation Services Pager [phone]  Office [phone]    Truddie Coco 12/25/2020, 4:28 PM

## 2020-12-26 DIAGNOSIS — J42 Unspecified chronic bronchitis: Secondary | ICD-10-CM | POA: Diagnosis not present

## 2020-12-26 DIAGNOSIS — Z515 Encounter for palliative care: Secondary | ICD-10-CM | POA: Diagnosis not present

## 2020-12-26 DIAGNOSIS — A4152 Sepsis due to Pseudomonas: Secondary | ICD-10-CM | POA: Diagnosis not present

## 2020-12-26 DIAGNOSIS — Z66 Do not resuscitate: Secondary | ICD-10-CM | POA: Diagnosis not present

## 2020-12-26 DIAGNOSIS — I4811 Longstanding persistent atrial fibrillation: Secondary | ICD-10-CM | POA: Diagnosis not present

## 2020-12-26 DIAGNOSIS — Z7189 Other specified counseling: Secondary | ICD-10-CM

## 2020-12-26 DIAGNOSIS — N390 Urinary tract infection, site not specified: Secondary | ICD-10-CM | POA: Diagnosis not present

## 2020-12-26 LAB — BLOOD GAS, ARTERIAL
Acid-base deficit: 3.1 mmol/L — ABNORMAL HIGH (ref 0.0–2.0)
Bicarbonate: 20.1 mmol/L (ref 20.0–28.0)
Drawn by: 24610
FIO2: 32
O2 Saturation: 95.7 %
Patient temperature: 36.5
pCO2 arterial: 27.6 mmHg — ABNORMAL LOW (ref 32.0–48.0)
pH, Arterial: 7.472 — ABNORMAL HIGH (ref 7.350–7.450)
pO2, Arterial: 75.1 mmHg — ABNORMAL LOW (ref 83.0–108.0)

## 2020-12-26 LAB — BASIC METABOLIC PANEL
Anion gap: 7 (ref 5–15)
BUN: 36 mg/dL — ABNORMAL HIGH (ref 8–23)
CO2: 22 mmol/L (ref 22–32)
Calcium: 8.7 mg/dL — ABNORMAL LOW (ref 8.9–10.3)
Chloride: 115 mmol/L — ABNORMAL HIGH (ref 98–111)
Creatinine, Ser: 1.06 mg/dL (ref 0.61–1.24)
GFR, Estimated: >60 mL/min (ref >60)
Glucose, Bld: 104 mg/dL — ABNORMAL HIGH (ref 70–99)
Potassium: 4.2 mmol/L (ref 3.5–5.1)
Sodium: 144 mmol/L (ref 135–145)

## 2020-12-26 LAB — CBC
HCT: 32.1 % — ABNORMAL LOW (ref 39.0–52.0)
Hemoglobin: 9.8 g/dL — ABNORMAL LOW (ref 13.0–17.0)
MCH: 27.5 pg (ref 26.0–34.0)
MCHC: 30.5 g/dL (ref 30.0–36.0)
MCV: 90.2 fL (ref 80.0–100.0)
Platelets: 358 10*3/uL (ref 150–400)
RBC: 3.56 MIL/uL — ABNORMAL LOW (ref 4.22–5.81)
RDW: 19.9 % — ABNORMAL HIGH (ref 11.5–15.5)
WBC: 21.5 10*3/uL — ABNORMAL HIGH (ref 4.0–10.5)
nRBC: 0.2 % (ref 0.0–0.2)

## 2020-12-26 LAB — GLUCOSE, CAPILLARY
Glucose-Capillary: 132 mg/dL — ABNORMAL HIGH (ref 70–99)
Glucose-Capillary: 112 mg/dL — ABNORMAL HIGH (ref 70–99)
Glucose-Capillary: 154 mg/dL — ABNORMAL HIGH (ref 70–99)
Glucose-Capillary: 143 mg/dL — ABNORMAL HIGH (ref 70–99)
Glucose-Capillary: 113 mg/dL — ABNORMAL HIGH (ref 70–99)
Glucose-Capillary: 106 mg/dL — ABNORMAL HIGH (ref 70–99)

## 2020-12-26 LAB — MAGNESIUM: Magnesium: 2 mg/dL (ref 1.7–2.4)

## 2020-12-26 LAB — PHOSPHORUS: Phosphorus: 3.5 mg/dL (ref 2.5–4.6)

## 2020-12-26 MED ORDER — PIPERACILLIN-TAZOBACTAM 3.375 G IVPB
3.3750 g | Freq: Three times a day (TID) | INTRAVENOUS | Status: DC
  Administered 2020-12-26 – 2020-12-27 (×4): 3.375 g via INTRAVENOUS
  Filled 2020-12-26 (×4): qty 50

## 2020-12-26 MED ORDER — HALOPERIDOL LACTATE 5 MG/ML IJ SOLN
2.0000 mg | Freq: Every day | INTRAMUSCULAR | Status: DC
  Administered 2020-12-26: 2 mg via INTRAVENOUS
  Filled 2020-12-26: qty 1

## 2020-12-26 MED ORDER — BUSPIRONE HCL 10 MG PO TABS
5.0000 mg | ORAL_TABLET | Freq: Two times a day (BID) | ORAL | Status: DC
  Administered 2020-12-26 – 2020-12-27 (×3): 5 mg via ORAL
  Filled 2020-12-26 (×3): qty 1

## 2020-12-26 NOTE — Progress Notes (Signed)
Lab called to say ABG sample was clotted.  Will attempt to re-draw.

## 2020-12-26 NOTE — Progress Notes (Signed)
Per discussion w/ RN- RN states that MD reviewed ABG and stated no bipap needed at this time.

## 2020-12-26 NOTE — Consult Note (Addendum)
Palliative Medicine Inpatient Consult Note  Consulting Provider: Flora Lipps, MD  Reason for consult:   Palliative Care Consult Services Palliative Medicine Consult   Symptom Management Consult  Reason for Consult? goals of care    HPI:  Per intake H&P --> 87 y/o male who presented to to the emergency department with altered mental status, fatigue, poor oral intake.  Patient was noted to be having dyspnea and wheezing.  Patient does have a chronic Foley from recent admission and was started on Keflex for UTI on 12/11/2020.  CT head showed stable left subdural hematoma.  Patient was started on antibiotic for possible sepsis and  zyprexa for delirium.  Patient was in the hospital in June 2022 for femur fracture and subdural hematoma.  Palliative care has been asked to get involved to further aid in goals of care conversations in the setting of multiple co-morbid conditions.  Clinical Assessment/Goals of Care:  *Please note that this is a verbal dictation therefore any spelling or grammatical errors are due to the "[place]" system interpretation.  I have reviewed medical records including EPIC notes, labs and imaging, received report from bedside RN, assessed the patient who is restless in his bed.   I called patient's spouse, Perrin Smack to further discuss diagnosis prognosis, GOC, EOL wishes, disposition and options.  A review of Greco's past medical conditions was reviewed inclusive of his history of BPH requiring a chronic Foley catheter, GERD, A. fib, COPD, and anemia.  We reviewed Reicher more acute conditions and his recent history which includes since May a fall causing a hip fracture requiring surgical intervention.  He is also suffered from a left subdural hematoma.  Since May 2022 Ebenezer has had a fairly continuous decline in his overall functional state.   I introduced Palliative Medicine as specialized medical care for people living with serious illness. It focuses on  providing relief from the symptoms and stress of a serious illness. The goal is to improve quality of life for both the patient and the family.  Horald is originally from [place], [place].  His father had move them to [place] and [place].  Marcellus has been married to his spouse Perrin Smack for the past 28 years.  They share 4 children and 10 grandchildren.  Donavin is a very well-established man and graduated from [employer] as an undergraduate going on to get a Scientist, water quality at [employer].  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  He is a man of faith and is in "elder" at the first [employer].  Higher to the pandemic Mckay was going to the [facility] 3 times a week.  Per his wife was in quite good health.  As of more recently he has required assistive aid with a walker to mobilize and has been dependent on a 24/7 caregiver privately hired through the family for all care needs.  A detailed discussion was had today regarding advanced directives - we have a copy of these on Vynca and his primary decision maker is his spouse, Perrin Smack.    Concepts specific to code status, artifical feeding and hydration, continued IV antibiotics and rehospitalization was had.  A review of patients present conditions was had. We discussed if patient were to undergo cardiopulmonary resuscitation that he would likely not fair terribly well. We reviewed  his living will which states to allow for a natural death. Perrin Smack shares that he would not wish for extraneous heroic measures. We discussed DNR and that this means we still pursue treatments though if he had a cardiopulmonary or respiratory arrest we would not cause his body additional harm or injury and would allow him to pass away as peacefully as possible. Confirmed patient will now be DNAR/DNI.  I  shared that it may be of utility for the family to all get together with the health care team to continue along with these conversations in the oncoming days. Perrin Smack was in agreement with this. She shares her son lives in [place] and her daughter is traveling in [place] though we can certainly all speak over the phone. She plans to be in the hospital this afternoon at [facility] and is interested in meeting with the Palliative care team.  Nutritionally patient has been unable to maintain on his own, brought up the concerns associated with this in the setting of chronic illness. Reviewed that he cannot sustain with an NGT long term.   Patients wife requests a 1:1 sitter. I shared with her that this is dependent upon the availability of hospital staff. We reviewed that she can certainly have her own personal aids come in which Perrin Smack was receptive to.   Discussed the importance of continued conversation with family and their  medical providers regarding overall plan of care and treatment options, ensuring decisions are within the context of the patients values and GOCs. _______________________________________________________________________ Family Meeting:  Participants: Neal Dy, Corlis Leak, Esmond Plants, Collie Siad  Providers Present: Flora Lipps, MD, and Tacey Ruiz, [facility]  Conversation:  I first met at bedside with the patient's wife, 2 daughters with the patient's other son and daughter on speaker phone.  I introduced palliative care and how we are utilized both inside and outside of the hospital.  A review of Kinley prior medical history was completed.  Son shares with me that he has suffered from giant cell arteritis since he was 87 years old though he had been relatively stable from that perspective.  We discussed his decline since May 2022 after he had a catheter placed for his BPH later leading to a fall and subdural hematoma &  hip fracture.   Reviewed the need for surgery for his hip as well as a burr for his hematoma.  We reviewed that since then he was able to make some improvements through physical and occupational therapies.  Review of [place] acute medical problems was identified inclusive of his admission for sepsis in the setting of an acute urinary tract infection.  We reviewed that he had recently had an aspirational event causing hypoxic respiratory failure.  Reviewed his ongoing and very complex encephalopathy thought to be related to hospital induced delirium.  I was able to with Josuha's family discuss his wishes as per review of his living will and testament.  Lamarr would not want extraneous measures to sustain life such as intubation or chest compressions.  All of his family are in agreements with this.  Daylyn is again a DO NOT RESUSCITATE DO NOT INTUBATE CODE STATUS.  Urges family reasonably so would like to see if over the next few days any improvements can be made.  Dr. Louanne Belton and his family had discussed broadening antibiotics, getting him out of bed to the chair daily, drawing an ABG with utilization of BiPAP if needed, a sputum culture for abx narrowing, and utilization  of medications such as morphine and Haldol as needed.    From a Palliative perspective patient's family requests Haldol be given every night prior to bedtime to allow him improvement of his sleep-wake cycle. WE reviewed implementing strict delirium precautions.  A review of the difference between Palliative care and hospice care was completed. We discussed the potential need for comfort care in the oncoming days if not improvements are seen. Patients family would like to allow 72 hours to see if patients condition improves prior to making additional decisions. His spouse, Perrin Smack is available by phone should Praneel decline and is in agreement if that does happen to discuss changing our focus to comfort care.  Time In: 1240 Time Out: 1402 Total Time:  82 Greater than 50%  of this time was spent counseling and coordinating care related to the above assessment and plan.  Decision Maker: Tarri Fuller (spouse) [phone]  SUMMARY OF RECOMMENDATIONS   DNAR/DNI, no escalation of care to the ICU --> treat the treatable on the progressive care unit  Trial period of treatment x72 hours  Patients wife encouraged to hire a Biomedical engineer if hospital cannot provide one  Delirium: Implement strict precautions, Start buspar BID, Haldol QHS & PRN  Dyspnea: Morphine 14m IV Q3H PRN, Family would like to proceed with an ABG and to place patient on Bipap if needed  Muscular Weakness: PT/OT, OOB daily with nursing  Failure to Thrive: Appreciate Dietician and speech therapy involovement  Ongoing PMT conversations in the oncoming days  Code Status/Advance Care Planning: DNAR/DNI  Palliative Prophylaxis:  Oral Care, Mobility  Additional Recommendations (Limitations, Scope, Preferences): Treat what is treatable  Psycho-social/Spiritual:  Desire for further Chaplaincy support: Yes Additional Recommendations: Education on chronic disease processes   Prognosis: Worrisome in the setting of recurrent hospitalizations and ER visits. Patient now with profound FTT.   Discharge Planning: Unclear presently  Vitals:   12/25/20 2231 12/25/20 2309  BP:    Pulse:    Resp: 20 20  Temp:    SpO2:      Intake/Output Summary (Last 24 hours) at 12/26/2020 0703 Last data filed at 12/26/2020 0456 Gross per 24 hour  Intake 100 ml  Output 1000 ml  Net -900 ml   Last Weight  Most recent update: 12/25/2020  8:58 AM    Weight  78.1 kg (172 lb 2.9 oz)            Gen:  Chronically ill appearing elderly M in moderate distress HEENT: Coretrack in place, moist mucous membranes CV: Irregular rate and rhythm  PULM: On 4LPM [place], (+) crackles ABD: soft/nontender  EXT: No edema  Neuro: Disoriented  PPS: 20%   This conversation/these recommendations  were discussed with patient primary care team, Dr. PLouanne Belton Time In: 0745 Time Out: 0855 Total Time: 763Greater than 50%  of this time was spent counseling and coordinating care related to the above assessment and plan.  [facility] [place] Team Cell Phone: [phone] utilize secure chat with additional questions, if there is no response within 30 minutes please call the above phone number  Palliative Medicine Team providers are available by phone from 7am to 7pm daily and can be reached through the team cell phone.  Should this patient require assistance outside of these hours, please call the patient's attending physician.

## 2020-12-26 NOTE — Progress Notes (Addendum)
Pharmacy Antibiotic Note - Initial Note  Nathan Mitchell is a 87 y.o. male admitted on 12/04/2020 with AMS and concern for urosepsis. During admission, received full course of treatment for p, aeroginosa in Ucx from chronic foley catheter. On 8/23, patient had emesis and CXR concerning for aspiration pneumonia. Zosyn was started and narrowed to ceftriaxone. Pharmacy consulted to re-start Zosyn with concern for increase leukocytosis today.   Plan: Zosyn 3.375g IV q8h (4 hour infusion). F/u LOT, clinical course, and de-escalation   Height: 6\' 1"  (185.4 cm) Weight: 78.1 kg (172 lb 2.9 oz) IBW/kg (Calculated) : 79.9  Temp (24hrs), Avg:97.7 F (36.5 C), Min:97.3 F (36.3 C), Max:98.2 F (36.8 C)  Recent Labs  Lab 12/22/20 0438 12/23/20 0238 12/24/20 0132 12/25/20 0308 12/26/20 0222  WBC 19.2* 18.1* 14.9* 16.5* 21.5*  CREATININE 1.77* 1.50* 1.37* 1.16 1.06    Estimated Creatinine Clearance: 53.2 mL/min (by C-G formula based on SCr of 1.06 mg/dL).    Allergies  Allergen Reactions   Nsaids     Due to afib   Tiotropium Bromide Monohydrate     Other reaction(s): ineffecticve   Umeclidinium-Vilanterol     Other reaction(s): ineffective    Antimicrobials this admission: Vanc 8/16 >> 8/18 Cefepime 8/16 >>8/21 Flagyl 8/16 >>8/18 Zosyn 8/21 >>8/22, 8/24>>8/25, 8/27 >> Ceftriaxone 8/25>>8/26   Microbiology results: 8/16 BCx: negative  8/16 UCx: >100,000 colonies Pseudomonas aeroginosa   8/17 MRSA PCR: not detected  Thank you for allowing pharmacy to be a part of this patient's care.  9/17 12/26/2020 1:51 PM

## 2020-12-26 NOTE — Progress Notes (Signed)
PROGRESS NOTE  Nathan Mitchell TFT:[id number] DOB: 02-Aug-1930 DOA: 12/12/2020 PCP: Merlene Laughter, MD   LOS: 10 days   Brief narrative: 87 y/o male who presented to to the emergency department with altered mental status, fatigue, poor oral intake.  Patient was noted to be having dyspnea and wheezing.  Patient does have a chronic Foley from recent admission and was started on Keflex for UTI on 12/11/2020.  CT head showed stable left subdural hematoma.  Patient was started on antibiotic for possible sepsis and  zyprexa for delirium.  Patient was in the hospital in June 2022 for femur fracture and subdural hematoma.  Assessment/Plan:  Principal Problem:   Sepsis (HCC) Active Problems:   Atrial fibrillation (HCC)   Chronic obstructive pulmonary disease, unspecified (HCC)   Dyslipidemia   Gastroesophageal reflux disease   Major depression in remission (HCC)   TBI (traumatic brain injury) (HCC)   Acute lower UTI   Acute metabolic encephalopathy  Acute metabolic encephalopathy likely from sepsis and ICU delirium.  Patient continues to be agitated confused and disoriented.  Patient has prolonged QTC.  We had a family meeting at bedside including palliative care.  At this time family wishes to continue with current level of care and escalate antibiotics and possible BiPAP if needed.  Family requesting ABG sputum culture and sensitivity and change of antibiotic.Marland Kitchen  History of left subdural hematoma in June 2022.   Patient had hip fracture and subdural hematoma in the past.  Hx of depression. Was on Lexapro.   Emesis with Concern for Aspiration pneumonia. Chest x-ray shows a worsening infiltrate.  Patient was on Zosyn initially which has been changed to Rocephin IV.  Continue suctioning albuterol nebulizer oxygen.  Will change back to Zosyn due to rising leukocytosis.   UTI with chronic foley and Pseudomonas aeruginosa in urine culture from 12/16/2020. Completed 7 days abx 8/22.  Continue  Foley catheter for urinary retention.  AKI from acute tubular necrosis secondary to sepsis, hypernatremia, urinary retention On free water flushes.  Flomax on hold at this time.  Creatinine of 1.1.   Constipation  Continue supportive care.   Hx of COPD. On pulmicort, brovana, yupelri. PRN albuterol and nebulizer frequently.   Atrial fibrillation with RVR.  Now with prolonged QTC. Continue to monitor electrolytes.  As needed metoprolol.   Currently rate controlled  Anemia of critical illness and chronic disease. Latest hemoglobin of 9.8.  Continue to monitor.   Hx of giant cell arteritis. Methotrexate on hold.  Steroids have been decreased secondary to delirium.   Dysphagia/Emesis 8/24 Currently on cortrak tube.  Patient has been seen by speech therapy and has been considered n.p.o.  Ethics/goals of care. Palliative care consultation to assist in goals of care.  DVT prophylaxis:   SCD for now  Code Status: DNR  Family Communication:  I had a family meeting alongside with palliative care today.  CODE STATUS is DNR  Status is: Inpatient  Remains inpatient appropriate because:Unsafe d/c plan, IV treatments appropriate due to intensity of illness or inability to take PO, and Inpatient level of care appropriate due to severity of illness  Dispo:  Patient From: Home  Planned Disposition: To be determined  Medically stable for discharge: No     Consultants: PCCM Palliative care  Procedures: Cortrak tube tube placement  Anti-infectives:  Zosyn IV  Anti-infectives (From admission, onward)    Start     Dose/Rate Route Frequency Ordered Stop   01/08/21 0600  ceFAZolin (ANCEF) IVPB  2g/100 mL premix  Status:  Discontinued        2 g 200 mL/hr over 30 Minutes Intravenous  Once 12/16/20 1948 12/16/20 1959   12/24/20 1400  cefTRIAXone (ROCEPHIN) 2 g in sodium chloride 0.9 % 100 mL IVPB  Status:  Discontinued        2 g 200 mL/hr over 30 Minutes Intravenous Every 24 hours  12/24/20 1035 12/26/20 1329   12/24/20 1200  Ampicillin-Sulbactam (UNASYN) 3 g in sodium chloride 0.9 % 100 mL IVPB  Status:  Discontinued        3 g 200 mL/hr over 30 Minutes Intravenous Every 8 hours 12/24/20 1024 12/24/20 1035   12/23/20 1200  piperacillin-tazobactam (ZOSYN) IVPB 3.375 g  Status:  Discontinued        3.375 g 12.5 mL/hr over 240 Minutes Intravenous Every 8 hours 12/23/20 1133 12/24/20 1024   12/20/20 1430  piperacillin-tazobactam (ZOSYN) IVPB 3.375 g        3.375 g 12.5 mL/hr over 240 Minutes Intravenous Every 8 hours 12/20/20 1337 12/21/20 2359   12/17/20 1500  vancomycin (VANCOREADY) IVPB 1750 mg/350 mL  Status:  Discontinued        1,750 mg 175 mL/hr over 120 Minutes Intravenous Every 48 hours 11/30/2020 1539 12/17/20 1024   12/17/20 1400  cefTRIAXone (ROCEPHIN) 2 g in sodium chloride 0.9 % 100 mL IVPB  Status:  Discontinued        2 g 200 mL/hr over 30 Minutes Intravenous Every 24 hours 12/17/20 1024 12/17/20 1226   12/17/20 1400  ceFEPIme (MAXIPIME) 2 g in sodium chloride 0.9 % 100 mL IVPB  Status:  Discontinued        2 g 200 mL/hr over 30 Minutes Intravenous Every 12 hours 12/17/20 1226 12/20/20 1332   12/16/20 2030  ceFAZolin (ANCEF) IVPB 2g/100 mL premix  Status:  Discontinued        2 g 200 mL/hr over 30 Minutes Intravenous  Once 12/16/20 1943 12/16/20 1948   12/16/20 0200  ceFEPIme (MAXIPIME) 2 g in sodium chloride 0.9 % 100 mL IVPB  Status:  Discontinued        2 g 200 mL/hr over 30 Minutes Intravenous Every 12 hours 12/14/2020 1539 12/17/20 1024   12/19/2020 2200  metroNIDAZOLE (FLAGYL) IVPB 500 mg  Status:  Discontinued        500 mg 100 mL/hr over 60 Minutes Intravenous Every 8 hours 12/06/2020 1953 12/17/20 1024   12/26/2020 1415  ceFEPIme (MAXIPIME) 2 g in sodium chloride 0.9 % 100 mL IVPB        2 g 200 mL/hr over 30 Minutes Intravenous  Once 12/05/2020 1402 12/23/2020 1528   12/17/2020 1415  metroNIDAZOLE (FLAGYL) IVPB 500 mg        500 mg 100 mL/hr over 60  Minutes Intravenous  Once 12/24/2020 1402 12/01/2020 1647   12/01/2020 1415  vancomycin (VANCOREADY) IVPB 1500 mg/300 mL        1,500 mg 150 mL/hr over 120 Minutes Intravenous  Once 12/20/2020 1402 12/03/2020 1900       Subjective: Today, patient was seen and examined at bedside.  Still continues to have labored breathing with congestion and intermittent lethargic.   Objective: Vitals:   12/26/20 0838 12/26/20 1153  BP:  125/90  Pulse:  99  Resp:  (!) 24  Temp:  97.8 F (36.6 C)  SpO2: 99% 98%    Intake/Output Summary (Last 24 hours) at 12/26/2020 1330 Last data filed at 12/26/2020  [id number] Gross per 24 hour  Intake 100 ml  Output 1000 ml  Net -900 ml    Filed Weights   12/22/20 0500 12/23/20 0500 12/25/20 0848  Weight: 74.7 kg 74.7 kg 78.1 kg   Body mass index is 22.72 kg/m.   Physical Exam:  GENERAL: Patient is appearing, episodes of apnea with rapid breathing, HENT: No scleral pallor or icterus. Pupils equally reactive to light. Oral mucosa is dry, upper airway sounds noted. NECK: is supple, no gross swelling noted. CHEST: Coarse breath sounds noted bilaterally, diminished breath sounds.   CVS: S1 and S2 heard, no murmur.  ABDOMEN: Soft, non-tender, bowel sounds are present. EXTREMITIES: No edema. CNS: Moving extremities, intermittently lethargic, SKIN: warm and dry without rashes.  Data Review: I have personally reviewed the following laboratory data and studies,  CBC: Recent Labs  Lab 12/21/20 0150 12/22/20 0438 12/23/20 0238 12/24/20 0132 12/25/20 0308 12/26/20 0222  WBC 12.9* 19.2* 18.1* 14.9* 16.5* 21.5*  NEUTROABS 11.4* 16.7* 14.6* 12.1* 13.8*  --   HGB 8.5* 8.9* 8.8* 9.5* 9.5* 9.8*  HCT 27.2* 29.3* 29.6* 32.4* 32.3* 32.1*  MCV 89.5 90.2 90.5 93.1 91.0 90.2  PLT 250 348 372 310 343 358    Basic Metabolic Panel: Recent Labs  Lab 12/22/20 0438 12/22/20 1658 12/23/20 0238 12/24/20 0132 12/25/20 0308 12/26/20 0222  NA 149*  --  149* 146* 144 144  K  4.3  --  4.1 4.2 3.8 4.2  CL 117*  --  121* 118* 114* 115*  CO2 21*  --  22 19* 23 22  GLUCOSE 135*  --  126* 131* 125* 104*  BUN 59*  --  58* 48* 37* 36*  CREATININE 1.77*  --  1.50* 1.37* 1.16 1.06  CALCIUM 8.7*  --  8.6* 8.5* 8.7* 8.7*  MG 2.5* 2.3 2.3  --  2.0 2.0  PHOS 4.4 3.8 3.6  --  3.5 3.5    Liver Function Tests: Recent Labs  Lab 12/21/20 0150 12/22/20 0438 12/23/20 0238 12/24/20 0132 12/25/20 0308  AST 24 22 20 16 17   ALT 21 22 24 24 25   ALKPHOS 46 43 38 38 43  BILITOT 0.9 1.0 0.8 1.0 0.6  PROT 6.0* 6.0* 5.9* 5.8* 6.1*  ALBUMIN 2.3* 2.4* 2.4* 2.3* 2.4*    No results for input(s): LIPASE, AMYLASE in the last 168 hours. No results for input(s): AMMONIA in the last 168 hours. Cardiac Enzymes: No results for input(s): CKTOTAL, CKMB, CKMBINDEX, TROPONINI in the last 168 hours. BNP (last 3 results) Recent Labs    11-10-2020 1426 12/16/20 1624 12/23/20 0238  BNP 344.5* 247.5* 704.3*     ProBNP (last 3 results) No results for input(s): PROBNP in the last 8760 hours.  CBG: Recent Labs  Lab 12/25/20 2010 12/25/20 2314 12/26/20 0456 12/26/20 0725 12/26/20 1150  GLUCAP 136* 124* 132* 112* 154*    Recent Results (from the past 240 hour(s))  MRSA Next Gen by PCR, Nasal     Status: None   Collection Time: 12/16/20  4:43 PM   Specimen: Nasal Mucosa; Nasal Swab  Result Value Ref Range Status   MRSA by PCR Next Gen NOT DETECTED NOT DETECTED Final    Comment: (NOTE) The GeneXpert MRSA Assay (FDA approved for NASAL specimens only), is one component of a comprehensive MRSA colonization surveillance program. It is not intended to diagnose MRSA infection nor to guide or monitor treatment for MRSA infections. Test performance is not FDA approved in patients less  than 60 years old. Performed at [facility], [address] [place], [place] [address]       Studies: DG CHEST PORT 1 VIEW  Result Date: 12/25/2020 CLINICAL DATA:  Dyspnea, wheezing EXAM:  PORTABLE CHEST 1 VIEW COMPARISON:  Chest radiograph 12/22/2020 FINDINGS: An enteric catheter is in place extending off the field of view. The heart is enlarged, unchanged. The mediastinal contours are stable. There is dense calcified atherosclerotic plaque of the aortic arch. There are bilateral pleural effusions, grossly similar. Patchy opacities throughout the lungs, worse on the right have overall slightly improved compared to the prior study. There is no pneumothorax. The bones are stable. IMPRESSION: Slightly improved aeration of the right lung since 12/22/2020 favored to reflect improved edema. Persistent bilateral pleural effusions with bibasilar airspace disease. Electronically Signed   By: Lesia Hausen M.D.   On: 12/25/2020 14:27      Joycelyn Das, MD  [facility] 12/26/2020  If 7PM-7AM, please contact night-coverage

## 2020-12-27 DIAGNOSIS — Z515 Encounter for palliative care: Secondary | ICD-10-CM | POA: Diagnosis not present

## 2020-12-27 DIAGNOSIS — J42 Unspecified chronic bronchitis: Secondary | ICD-10-CM | POA: Diagnosis not present

## 2020-12-27 DIAGNOSIS — N390 Urinary tract infection, site not specified: Secondary | ICD-10-CM | POA: Diagnosis not present

## 2020-12-27 DIAGNOSIS — I4811 Longstanding persistent atrial fibrillation: Secondary | ICD-10-CM | POA: Diagnosis not present

## 2020-12-27 DIAGNOSIS — Z7189 Other specified counseling: Secondary | ICD-10-CM | POA: Diagnosis not present

## 2020-12-27 DIAGNOSIS — A4152 Sepsis due to Pseudomonas: Secondary | ICD-10-CM | POA: Diagnosis not present

## 2020-12-27 LAB — GLUCOSE, CAPILLARY
Glucose-Capillary: 127 mg/dL — ABNORMAL HIGH (ref 70–99)
Glucose-Capillary: 122 mg/dL — ABNORMAL HIGH (ref 70–99)
Glucose-Capillary: 143 mg/dL — ABNORMAL HIGH (ref 70–99)

## 2020-12-27 LAB — CBC
HCT: 35.4 % — ABNORMAL LOW (ref 39.0–52.0)
Hemoglobin: 10.5 g/dL — ABNORMAL LOW (ref 13.0–17.0)
MCH: 27.7 pg (ref 26.0–34.0)
MCHC: 29.7 g/dL — ABNORMAL LOW (ref 30.0–36.0)
MCV: 93.4 fL (ref 80.0–100.0)
Platelets: 370 10*3/uL (ref 150–400)
RBC: 3.79 MIL/uL — ABNORMAL LOW (ref 4.22–5.81)
RDW: 20.6 % — ABNORMAL HIGH (ref 11.5–15.5)
WBC: 30.4 10*3/uL — ABNORMAL HIGH (ref 4.0–10.5)
nRBC: 0.1 % (ref 0.0–0.2)

## 2020-12-27 LAB — BASIC METABOLIC PANEL
Anion gap: 11 (ref 5–15)
BUN: 40 mg/dL — ABNORMAL HIGH (ref 8–23)
CO2: 24 mmol/L (ref 22–32)
Calcium: 9.2 mg/dL (ref 8.9–10.3)
Chloride: 112 mmol/L — ABNORMAL HIGH (ref 98–111)
Creatinine, Ser: 1.1 mg/dL (ref 0.61–1.24)
GFR, Estimated: >60 mL/min (ref >60)
Glucose, Bld: 129 mg/dL — ABNORMAL HIGH (ref 70–99)
Potassium: 4.3 mmol/L (ref 3.5–5.1)
Sodium: 147 mmol/L — ABNORMAL HIGH (ref 135–145)

## 2020-12-27 LAB — MAGNESIUM: Magnesium: 2 mg/dL (ref 1.7–2.4)

## 2020-12-27 LAB — PHOSPHORUS: Phosphorus: 4.2 mg/dL (ref 2.5–4.6)

## 2020-12-27 MED ORDER — HYDROMORPHONE HCL 1 MG/ML IJ SOLN
1.0000 mg | INTRAMUSCULAR | Status: DC | PRN
  Administered 2020-12-27: 2 mg via INTRAVENOUS
  Filled 2020-12-27: qty 2

## 2020-12-27 MED ORDER — HYDROMORPHONE HCL 1 MG/ML IJ SOLN
INTRAMUSCULAR | Status: AC
  Filled 2020-12-27: qty 1

## 2020-12-27 MED ORDER — GLYCOPYRROLATE 0.2 MG/ML IJ SOLN
0.2000 mg | INTRAMUSCULAR | Status: DC | PRN
  Administered 2020-12-27: 0.2 mg via INTRAVENOUS
  Filled 2020-12-27 (×2): qty 1

## 2020-12-27 MED ORDER — ONDANSETRON HCL 4 MG/2ML IJ SOLN: 4.0000 mg | Freq: Four times a day (QID) | INTRAMUSCULAR | Status: DC | PRN

## 2020-12-27 MED ORDER — LORAZEPAM 2 MG/ML IJ SOLN
0.5000 mg | INTRAMUSCULAR | Status: DC | PRN
  Administered 2020-12-27 (×2): 1 mg via INTRAVENOUS
  Filled 2020-12-27 (×2): qty 1

## 2020-12-27 MED ORDER — BISACODYL 10 MG RE SUPP
10.0000 mg | Freq: Every day | RECTAL | Status: DC | PRN
Start: 2020-12-27 — End: 2020-12-28

## 2020-12-27 MED ORDER — HYDROMORPHONE HCL 1 MG/ML IJ SOLN
0.5000 mg | Freq: Once | INTRAMUSCULAR | Status: AC | PRN
Start: 2020-12-27 — End: 2020-12-27
  Administered 2020-12-27: 0.5 mg via INTRAVENOUS
  Filled 2020-12-27: qty 1

## 2020-12-27 MED ORDER — HYDROMORPHONE HCL 1 MG/ML IJ SOLN: 0.5000 mg | INTRAMUSCULAR | Status: DC | PRN

## 2020-12-27 MED ORDER — SCOPOLAMINE 1 MG/3DAYS TD PT72
1.0000 | MEDICATED_PATCH | TRANSDERMAL | Status: DC
  Administered 2020-12-27: 1.5 mg via TRANSDERMAL
  Filled 2020-12-27: qty 1

## 2020-12-27 MED ORDER — HYDROMORPHONE HCL 1 MG/ML IJ SOLN
1.0000 mg | Freq: Once | INTRAMUSCULAR | Status: AC
  Administered 2020-12-27: 1 mg via INTRAVENOUS
  Filled 2020-12-27: qty 1

## 2020-12-27 MED ORDER — GLYCOPYRROLATE 0.2 MG/ML IJ SOLN
0.2000 mg | INTRAMUSCULAR | Status: DC | PRN
  Administered 2020-12-27 (×3): 0.2 mg via INTRAVENOUS
  Filled 2020-12-27 (×2): qty 1

## 2020-12-27 MED ORDER — LORAZEPAM 2 MG/ML IJ SOLN
INTRAMUSCULAR | Status: AC
  Filled 2020-12-27: qty 1

## 2020-12-27 MED ORDER — GLYCOPYRROLATE 0.2 MG/ML IJ SOLN
0.4000 mg | INTRAMUSCULAR | Status: DC
  Administered 2020-12-27: 0.4 mg via INTRAVENOUS
  Filled 2020-12-27: qty 2

## 2020-12-27 MED ORDER — POLYVINYL ALCOHOL 1.4 % OP SOLN
1.0000 [drp] | Freq: Four times a day (QID) | OPHTHALMIC | Status: DC | PRN
  Filled 2020-12-27: qty 15

## 2020-12-27 MED ORDER — SODIUM CHLORIDE 0.9 % IV SOLN
1.0000 mg/h | INTRAVENOUS | Status: DC
  Administered 2020-12-27: 2 mg/h via INTRAVENOUS
  Filled 2020-12-27: qty 5

## 2020-12-27 MED ORDER — HYDROMORPHONE HCL 1 MG/ML IJ SOLN
1.0000 mg | INTRAMUSCULAR | Status: DC | PRN
  Administered 2020-12-27: 1 mg via INTRAVENOUS

## 2020-12-27 MED ORDER — ONDANSETRON 4 MG PO TBDP: 4.0000 mg | ORAL_TABLET | Freq: Four times a day (QID) | ORAL | Status: DC | PRN

## 2020-12-27 MED ORDER — HYDROMORPHONE BOLUS VIA INFUSION
1.0000 mg | INTRAVENOUS | Status: DC | PRN
Start: 2020-12-27 — End: 2020-12-28
  Administered 2020-12-27: 1 mg via INTRAVENOUS
  Filled 2020-12-27: qty 1

## 2020-12-27 MED ORDER — HYDROMORPHONE HCL 1 MG/ML IJ SOLN
0.5000 mg | INTRAMUSCULAR | Status: DC | PRN
  Administered 2020-12-27 (×2): 0.5 mg via INTRAVENOUS
  Filled 2020-12-27 (×2): qty 1

## 2020-12-27 MED ORDER — HYDROMORPHONE HCL 1 MG/ML IJ SOLN
1.0000 mg | INTRAMUSCULAR | Status: DC
  Administered 2020-12-27: 1 mg via INTRAVENOUS
  Filled 2020-12-27: qty 1

## 2020-12-27 MED ORDER — LORAZEPAM 2 MG/ML IJ SOLN
1.0000 mg | INTRAMUSCULAR | Status: DC
  Administered 2020-12-27: 1 mg via INTRAVENOUS

## 2020-12-29 ENCOUNTER — Ambulatory Visit: Payer: Medicare PPO | Admitting: Podiatry

## 2020-12-31 NOTE — Progress Notes (Signed)
Pt has received all scheduled and PRN medications available at this time and remains restless and crying out in discomfort. Pt is moving all over the bed and unable to get comfortable. Sitter at bedside. MD notified.

## 2020-12-31 NOTE — Progress Notes (Signed)
Pt continues to moan and cry out in distress. Pt constantly writhing/moving in bed and states he "hurts everywhere". Pain medications, haldol and non-pharmacological techniques have been unsuccessful. MD aware.

## 2020-12-31 NOTE — Progress Notes (Signed)
2 RN Katerin Negrete P and Katie S pronounced death at 528pm.

## 2020-12-31 NOTE — Progress Notes (Signed)
PROGRESS NOTE  Nathan Mitchell ZJI:[id number] DOB: [birth year] DOA: 12-28-20 PCP: Merlene Laughter, MD   LOS: 11 days   Brief narrative: 87 y/o male who presented to to the emergency department with altered mental status, fatigue, poor oral intake.  Patient was noted to be having dyspnea and wheezing.  Patient does have a chronic Foley from recent admission and was started on Keflex for UTI on 12/11/2020.  CT head showed stable left subdural hematoma.  Patient was started on antibiotic for possible sepsis  Patient was in the hospital in June 2022 for femur fracture and subdural hematoma.  During hospitalization, patient's condition did not improve.  He persisted to have respiratory distress and labored breathing with anxiety agitation and pain issues.  Despite antibiotics and appropriate treatment his condition did not improve.  Palliative care was consulted and goals of care was discussed.  At this time, patient has been transitioned to comfort care.  Assessment/Plan:  Principal Problem:   Sepsis (HCC) Active Problems:   Atrial fibrillation (HCC)   Chronic obstructive pulmonary disease, unspecified (HCC)   Dyslipidemia   Gastroesophageal reflux disease   Major depression in remission (HCC)   TBI (traumatic brain injury) (HCC)   Acute lower UTI   Acute metabolic encephalopathy  Acute metabolic encephalopathy and delirium likely from sepsis. Patient continues to be agitated confused and disoriented despite being on morphine and Dilaudid..  Palliative care has been involved in goals of care.  ABG was done yesterday and does not require BiPAP.  Antibiotic was changed to Zosyn yesterday at the request of family.  Despite that his leukocytosis has trended up.  He does not seem to be turning around with his clinical condition.  History of left subdural hematoma in June 2022.   Patient had hip fracture and subdural hematoma in the past.  Does have subsequent decline after these incidents in  the past  Hx of depression. Was on Lexapro at home.  Currently on comfort care.   Emesis with Concern for Aspiration pneumonia. Chest x-ray showed worsening infiltrate. Continue suctioning, albuterol nebulizer, oxygen.  Had been changed to Zosyn.  This has been discontinued since patient is on comfort care.   UTI with chronic foley and Pseudomonas aeruginosa in urine culture from 12/28/20. Completed 7 days abx 8/22.  On  Foley catheter for comfort..  AKI from acute tubular necrosis secondary to sepsis, hypernatremia, urinary retention Latest creatinine of 1.1.   Hx of COPD. Was on pulmicort, brovana, yupelri. PRN albuterol and nebulizer.  On comfort care at this time  Atrial fibrillation with RVR.  Now with prolonged QTC. Was on metoprolol.  On comfort care at this time  Anemia of critical illness and chronic disease. Latest hemoglobin of 10.5.   Hx of giant cell arteritis. Was on prednisone.  Has been discontinued for comfort.   Dysphagia/Emesis 8/24 Patient was on cortrak tube tube for nutrition.  Ethics/goals of care. Palliative care was consulted to assist in goals of care.  At this time, after discussion with the family plan is to proceed with comfort care.  I was present yesterday to discuss with family about the prognosis of the patient.  DVT prophylaxis:   SCD for now  Code Status: DNR  Family Communication:  None today.  Palliative care had discussed with the family and the plan is to proceed with comfort care.  Status is: Inpatient  Remains inpatient appropriate because:Unsafe d/c plan, IV treatments appropriate due to intensity of illness or inability to  take PO, and Inpatient level of care appropriate due to severity of illness  Dispo:  Patient From: Home  Planned Disposition: Comfort care has been initiated.  Likely in  hospital death..  Consultants: PCCM Palliative care  Procedures: Cortrak tube tube placement  Anti-infectives:  Zosyn  IV  Subjective:  Today, patient was seen and examined at bedside.  Patient appears to be in labored breathing with episodes of apnea and rapid breathing.  Clinical condition has not improved.  Objective: Vitals:   2021-01-20 0853 January 20, 2021 1258  BP:    Pulse:  (!) 133  Resp:  (!) 29  Temp:  97.8 F (36.6 C)  SpO2: 95% 98%    Intake/Output Summary (Last 24 hours) at 01/20/2021 1509 Last data filed at January 20, 2021 0432 Gross per 24 hour  Intake 480 ml  Output 1100 ml  Net -620 ml    Filed Weights   12/22/20 0500 12/23/20 0500 12/25/20 0848  Weight: 74.7 kg 74.7 kg 78.1 kg   Body mass index is 22.72 kg/m.   Physical Exam: GENERAL: Patient is frail ill and agitated at times with labored breathing and periods of apnea.  In moderate distress due to pain.   HENT:  Oral mucosa is dry, upper airway sounds noted. NECK: is supple, no gross swelling noted. CHEST: Coarse breath sounds noted bilaterally, diminished breath sounds.   CVS: S1 and S2 heard, no murmur.  ABDOMEN: Soft, non-tender, bowel sounds are present. EXTREMITIES: No edema. CNS: Moving extremities, intermittently lethargic encephalopathy with SKIN: warm and dry without rashes.  Data Review: I have personally reviewed the following laboratory data and studies,  CBC: Recent Labs  Lab 12/21/20 0150 12/22/20 0438 12/23/20 0238 12/24/20 0132 12/25/20 0308 12/26/20 0222 Jan 20, 2021 0237  WBC 12.9* 19.2* 18.1* 14.9* 16.5* 21.5* 30.4*  NEUTROABS 11.4* 16.7* 14.6* 12.1* 13.8*  --   --   HGB 8.5* 8.9* 8.8* 9.5* 9.5* 9.8* 10.5*  HCT 27.2* 29.3* 29.6* 32.4* 32.3* 32.1* 35.4*  MCV 89.5 90.2 90.5 93.1 91.0 90.2 93.4  PLT 250 348 372 310 343 358 370    Basic Metabolic Panel: Recent Labs  Lab 12/22/20 1658 12/23/20 0238 12/24/20 0132 12/25/20 0308 12/26/20 0222 01-20-21 0237  NA  --  149* 146* 144 144 147*  K  --  4.1 4.2 3.8 4.2 4.3  CL  --  121* 118* 114* 115* 112*  CO2  --  22 19* 23 22 24   GLUCOSE  --  126*  131* 125* 104* 129*  BUN  --  58* 48* 37* 36* 40*  CREATININE  --  1.50* 1.37* 1.16 1.06 1.10  CALCIUM  --  8.6* 8.5* 8.7* 8.7* 9.2  MG 2.3 2.3  --  2.0 2.0 2.0  PHOS 3.8 3.6  --  3.5 3.5 4.2    Liver Function Tests: Recent Labs  Lab 12/21/20 0150 12/22/20 0438 12/23/20 0238 12/24/20 0132 12/25/20 0308  AST 24 22 20 16 17   ALT 21 22 24 24 25   ALKPHOS 46 43 38 38 43  BILITOT 0.9 1.0 0.8 1.0 0.6  PROT 6.0* 6.0* 5.9* 5.8* 6.1*  ALBUMIN 2.3* 2.4* 2.4* 2.3* 2.4*    No results for input(s): LIPASE, AMYLASE in the last 168 hours. No results for input(s): AMMONIA in the last 168 hours. Cardiac Enzymes: No results for input(s): CKTOTAL, CKMB, CKMBINDEX, TROPONINI in the last 168 hours. BNP (last 3 results) Recent Labs    12/03/2020 1426 12/16/20 1624 12/23/20 0238  BNP 344.5* 247.5*  704.3*     ProBNP (last 3 results) No results for input(s): PROBNP in the last 8760 hours.  CBG: Recent Labs  Lab 12/26/20 1908 12/26/20 2258 12/18/2020 0430 12/30/2020 0825 12/03/2020 1248  GLUCAP 113* 106* 127* 122* 143*    No results found for this or any previous visit (from the past 240 hour(s)).    Studies: No results found.    Joycelyn Das, MD  [facility] 12/03/2020  If 7PM-7AM, please contact night-coverage

## 2020-12-31 NOTE — Progress Notes (Signed)
Cross-coverage note:   Notified that patient appears to be in pain and calling out despite morphine. Plan to try a dose of 0.5 mg Dilaudid.

## 2020-12-31 NOTE — Plan of Care (Signed)
  Problem: Coping: Goal: Level of anxiety will decrease Outcome: Progressing   Problem: Health Behavior/Discharge Planning: Goal: Ability to manage health-related needs will improve Outcome: Not Progressing   Problem: Clinical Measurements: Goal: Respiratory complications will improve Outcome: Not Progressing

## 2020-12-31 NOTE — Progress Notes (Signed)
Chaplain responded to page to offer family support at end of pt's life. Chaplain met family gathered at bedside.  A grandson was playing soft music on a portable keyboard. Family was quietly gathered around the bed.  Chaplain offered ministry of presence with wife and children.  Chaplain knelt beside wife, Nathan Mitchell, who explained they had been married 29  years.  She was tearful.  Chaplain prayed and read scripture suitable for the time of death.   Chaplain available for follow up care if needed.  [place]

## 2020-12-31 NOTE — Progress Notes (Signed)
PT noted to be agitated crying out and moaning, writhing in the bed, unable to console or redirect with reposition MD made aware see new orders.

## 2020-12-31 NOTE — Progress Notes (Signed)
Attempted to obtain BP on pt. But it made pt very agitated and the family requested for me to take off BP cuff and stop. BP cuff removed. PRN dilaudid given per orders

## 2020-12-31 NOTE — Death Summary Note (Signed)
DEATH SUMMARY   Patient Details  Name: Nathan Mitchell MRN: [id number] DOB: 1931/04/03  Admission/Discharge Information   Admit Date:  Jan 06, 2021  Date of Death: Date of Death: 01-18-21  Time of Death: Time of Death: 22-Sep-1726  Length of Stay: 02-Oct-2022  Referring Physician: Merlene Laughter, MD   Reason(s) for Hospitalization  Altered mental status  Diagnoses  Preliminary cause of death:  Secondary Diagnoses (including complications and co-morbidities):  Principal Problem:   Sepsis (HCC) Active Problems:   Atrial fibrillation (HCC)   Chronic obstructive pulmonary disease, unspecified (HCC)   Dyslipidemia   Gastroesophageal reflux disease   Major depression in remission (HCC)   TBI (traumatic brain injury) (HCC)   Acute lower UTI   Acute metabolic encephalopathy   Brief Hospital Course (including significant findings, care, treatment, and services provided and events leading to death)  Nathan Mitchell is a 87 y.o. year old male with past medical history of atrial fibrillation, COPD, dyslipidemia, GERD, who presented to to the emergency department with altered mental status, fatigue, poor oral intake.  Patient was noted to be having dyspnea and wheezing.  Patient does have a chronic Foley from recent admission and was started on Keflex for UTI on 12/11/2020.  CT head showed stable left subdural hematoma.  Patient was started on antibiotic for possible sepsis  Patient was in the hospital in June 2022 for femur fracture and subdural hematoma.   During hospitalization, patient was treated for acute metabolic encephalopathy with delirium likely from sepsis, subsequently had significant aspiration with respiratory distress.  Despite broad-spectrum antibiotic and multiple modalities,patient's condition did not improve.  He persisted to have respiratory distress and labored breathing with anxiety agitation and pain issues.   Palliative care was consulted and goals of care was discussed.   Patient was subsequently transitioned to comfort care after discussing with the family..   Pertinent Labs and Studies  Significant Diagnostic Studies DG Chest 1 View  Result Date: 12/11/2020 CLINICAL DATA:  Fever EXAM: CHEST  1 VIEW COMPARISON:  Chest radiograph 10/07/2020 FINDINGS: The heart is enlarged, unchanged. The mediastinal contours are stable. There is calcified atherosclerotic plaque of the aortic arch. Slightly increased interstitial markings bilaterally is unchanged. There is no focal consolidation or pulmonary edema. There is no pleural effusion or pneumothorax. Retrocardiac opacity is consistent with a large hiatal hernia. There is degenerative change of both shoulders. There is no acute osseous abnormality. IMPRESSION: Stable cardiomegaly with no radiographic evidence of acute cardiopulmonary process. Electronically Signed   By: Lesia Hausen M.D.   On: 12/11/2020 15:23   CT HEAD WO CONTRAST ( )  Result Date: 12/16/2020 CLINICAL DATA:  Delirium.  Worsened mental status. EXAM: CT HEAD WITHOUT CONTRAST TECHNIQUE: Contiguous axial images were obtained from the base of the skull through the vertex without intravenous contrast. COMPARISON:  Jan 06, 2021 FINDINGS: Brain: Severely motion degraded exam. No change or acute finding. Low-density left-sided chronic subdural collection with maximal thickness of 12-13 mm. Mild mass-effect upon the brain but no midline shift, because of generalized atrophy. Old small vessel infarction in the left thalamus. Vascular: There is atherosclerotic calcification of the major vessels at the base of the brain. Skull: Left frontoparietal burr hole.  Otherwise negative. Sinuses/Orbits: Clear/normal as seen with motion. Other: None IMPRESSION: Motion degraded examination. No new or worsening finding. Chronic low-density subdural collection on the left maximal thickness of 12-13 mm. Electronically Signed   By: Paulina Fusi M.D.   On: 12/16/2020 16:03   CT HEAD  WO  CONTRAST (5MM)  Result Date: 12/25/2020 CLINICAL DATA:  Altered mental status. EXAM: CT HEAD WITHOUT CONTRAST TECHNIQUE: Contiguous axial images were obtained from the base of the skull through the vertex without intravenous contrast. COMPARISON:  CT 12 days ago 12/03/2020 FINDINGS: Brain: Left holo hemispheric subdural hematoma has re-distributed from prior exam, currently more prominent overlying the temporal occipital lobes rather than the frontal parietal lobes. Maximal thickness is similar at 14 mm, previously 13 mm. This is predominantly low-density, some intermediate density primarily in the pendant portion was also seen on prior exam and appears similar. The degree of midline shift has improved, currently 2-3 mm, previously 4 mm. There is no new hemorrhage. Generalized atrophy and chronic small vessel ischemia similar. 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 No acute ischemia. The basilar cisterns remain patent. Vascular: Atherosclerosis of skullbase vasculature without hyperdense vessel or abnormal calcification. Skull: Again seen left frontal burr hole. No skull fracture or acute findings. Sinuses/Orbits: No acute findings. Bilateral cataract resection. Small mucous retention cyst and mucosal thickening in the right frontal sinus. Mastoid air cells are clear. Other: None. IMPRESSION: 1. Left-sided subdural hematoma which is not significantly changed in size over the past 12 days, with slight decreased midline shift. No evidence of acute hemorrhage. 2. Unchanged atrophy and chronic small vessel ischemia. Remote lacunar infarcts in the left thalamus. Electronically Signed   By: Narda RutherfordMelanie  Sanford M.D.   On: 12/19/2020 17:01   CT HEAD WO CONTRAST  Result Date: 12/04/2020 CLINICAL DATA:  Follow-up subdural hematoma. EXAM: CT HEAD WITHOUT CONTRAST TECHNIQUE: Contiguous axial images were obtained from the base of the skull through the vertex without intravenous contrast. COMPARISON:  CT head  10/08/2020 FINDINGS: Brain: Mixed density subdural hematoma on the left has improved since the prior study. This now measures 13 mm in thickness and has predominately low density blood products and fluid. Small areas of intermediate density. No new hemorrhage since the prior study. Improved mass-effect. Decreased midline shift which is now approximately 4 mm. Generalized atrophy. Chronic microvascular ischemic changes throughout the white matter and left thalamus. No acute infarct or mass. Vascular: Negative for hyperdense vessel Skull: Left frontal burr hole. Sinuses/Orbits: Mild mucosal edema paranasal sinuses. Bilateral cataract extraction. Other: None IMPRESSION: Mixed density subdural hematoma on the left has improved now measuring 13 mm in thickness. Improvement in midline shift. No new hemorrhage since the prior CT. Electronically Signed   By: Marlan Palauharles  Clark M.D.   On: 12/04/2020 16:34   US RENAL  Result Date: 12/16/2020 CLINICAL DATA:  Urinary tract infection. EXAM: RENAL / URINARY TRACT ULTRASOUND COMPLETE COMPARISON:  Sep 05, 2020. FINDINGS: Right Kidney: Renal measurements: 10.9 x 5.9 x 5.3 cm = volume: 179 mL. Echogenicity within normal limits. No mass or hydronephrosis visualized. Left Kidney: Renal measurements: 10.9 x 5.4 x 4.6 cm = volume: 142 mL. Mild left hydronephrosis is noted. Echogenicity within normal limits. No mass visualized. Bladder: Decompressed secondary to Foley catheter. Other: None. IMPRESSION: Mild left hydronephrosis is noted. CT urogram is recommended evaluate for distal ureteral obstruction. Grossly normal right kidney. Electronically Signed   By: Lupita RaiderJames  Green Jr M.D.   On: 12/16/2020 20:45   DG CHEST PORT 1 VIEW  Result Date: 12/25/2020 CLINICAL DATA:  Dyspnea, wheezing EXAM: PORTABLE CHEST 1 VIEW COMPARISON:  Chest radiograph 12/22/2020 FINDINGS: An enteric catheter is in place extending off the field of view. The heart is enlarged, unchanged. The mediastinal contours are  stable. There is dense  calcified atherosclerotic plaque of the aortic arch. There are bilateral pleural effusions, grossly similar. Patchy opacities throughout the lungs, worse on the right have overall slightly improved compared to the prior study. There is no pneumothorax. The bones are stable. IMPRESSION: Slightly improved aeration of the right lung since 12/22/2020 favored to reflect improved edema. Persistent bilateral pleural effusions with bibasilar airspace disease. Electronically Signed   By: Lesia Hausen M.D.   On: 12/25/2020 14:27   DG Chest Port 1 View  Result Date: 12/22/2020 CLINICAL DATA:  No history provided EXAM: PORTABLE CHEST 1 VIEW COMPARISON:  12/20/2020 FINDINGS: Cardiac enlargement. Bilateral pulmonary infiltrates, progressing since previous study. This could indicate developing edema, increasing pneumonia, or interval aspiration. No definite pleural effusions. No pneumothorax. An enteric tube has been placed. Tip is off the field of view but below the left hemidiaphragm. Calcification of the aorta. IMPRESSION: Increasing pulmonary infiltrates bilaterally. Electronically Signed   By: Burman Nieves M.D.   On: 12/22/2020 23:38   DG Chest Port 1 View  Result Date: 12/20/2020 CLINICAL DATA:  Altered mental status.  Pleural effusion. EXAM: PORTABLE CHEST 1 VIEW COMPARISON:  12/16/2020 FINDINGS: Cardiomegaly remains stable. Aortic atherosclerotic calcification noted. Coarse pulmonary interstitial prominence is stable and suspicious for chronic interstitial lung disease. No evidence of pulmonary consolidation or pleural effusion. IMPRESSION: Stable cardiomegaly and probable chronic interstitial lung disease. No acute findings. Electronically Signed   By: Danae Orleans M.D.   On: 12/20/2020 10:45   DG CHEST PORT 1 VIEW  Result Date: 12/16/2020 CLINICAL DATA:  Hypoxia.  Altered mental status. EXAM: PORTABLE CHEST 1 VIEW COMPARISON:  Jan 08, 2021 FINDINGS: Moderate cardiomegaly remains  stable. Aortic atherosclerotic calcification noted. Chronic pulmonary interstitial prominence is again seen. No evidence of acute infiltrate or pleural effusion. IMPRESSION: Stable cardiomegaly chronic pulmonary interstitial prominence. No acute findings. Electronically Signed   By: Danae Orleans M.D.   On: 12/16/2020 15:06   DG Chest Port 1 View  Result Date: 01-08-2021 CLINICAL DATA:  Shortness of breath and altered mental status for 1 day EXAM: PORTABLE CHEST 1 VIEW COMPARISON:  12/11/2020 FINDINGS: Midline trachea. Mild cardiomegaly. Atherosclerosis in the transverse aorta. No pleural effusion or pneumothorax. Similar interstitial thickening. Mild bibasilar airspace disease. IMPRESSION: Cardiomegaly with chronic interstitial thickening, likely related to smoking or chronic bronchitis. Mild bibasilar Airspace disease, likely atelectasis. Aortic Atherosclerosis (ICD10-I70.0). Electronically Signed   By: Jeronimo Greaves M.D.   On: 01/08/21 14:21   DG Abd Portable 1V  Result Date: 12/21/2020 CLINICAL DATA:  Feeding tube placement. EXAM: PORTABLE ABDOMEN - 1 VIEW COMPARISON:  12/20/2020 FINDINGS: Feeding tube tip is in the descending duodenum. Bowel gas pattern is nonspecific. Bones are diffusely demineralized. IMPRESSION: Feeding tube tip is in the descending duodenum. Electronically Signed   By: Kennith Center M.D.   On: 12/21/2020 13:18   DG Abd Portable 1V  Result Date: 12/20/2020 CLINICAL DATA:  Sepsis EXAM: PORTABLE ABDOMEN - 1 VIEW COMPARISON:  CT 12/18/2020 FINDINGS: Scattered airspace opacities in the visualized right lung base, with some motion degradation. Gas in nondilated small bowel and colon without suggestion of obstruction. Aortic Atherosclerosis (ICD10-170.0). Right proximal femoral fixation hardware. IMPRESSION: Nonobstructive bowel gas pattern Electronically Signed   By: Corlis Leak M.D.   On: 12/20/2020 15:58   EEG adult  Result Date: 12/17/2020 Charlsie Quest, MD     12/17/2020   8:47 AM Patient Name: DAMACIO WEISGERBER Mitchell MRN: [id number] Epilepsy Attending: Charlsie Quest Referring Physician/Provider: Dr Lynnell Catalan Date:  12/16/2020 Duration: 24.37 mins Patient history: 87 year old male with altered mental status.  EEG to evaluate for seizures. Level of alertness: Asleep AEDs during EEG study: None Technical aspects: This EEG study was done with scalp electrodes positioned according to the 10-20 International system of electrode placement. Electrical activity was acquired at a sampling rate of 500Hz  and reviewed with a high frequency filter of 70Hz  and a low frequency filter of 1Hz . EEG data were recorded continuously and digitally stored. Description: Sleep was characterized by sleep spindles (12 to 14 Hz), maximal frontocentral region. EEG showed continuous generalized 3 to 6 Hz theta-delta slowing. Hyperventilation and photic stimulation were not performed.   ABNORMALITY - Continuous slow, generalized IMPRESSION: This study is suggestive of moderate diffuse encephalopathy, nonspecific etiology. No seizures or epileptiform discharges were seen throughout the recording.   ECHOCARDIOGRAM COMPLETE  Result Date: 12/17/2020    ECHOCARDIOGRAM REPORT   Patient Name:   JIAN HODGMAN Mitchell Date of Exam: 12/17/2020 Medical Rec #:  12/19/2020            Height:       73.0 in Accession #:    Reather Converse           Weight:       168.4 lb Date of Birth:  17-Dec-1930            BSA:          2.000 m Patient Age:    87 years             BP:           115/79 mmHg Patient Gender: M                    HR:           102 bpm. Exam Location:  Inpatient Procedure: 2D Echo, Cardiac Doppler and Color Doppler Indications:    Heart block, Complete I44.2  History:        Patient has prior history of Echocardiogram examinations, most                 recent 12/30/2017.  Sonographer:    [id number] RDCS Referring Phys: 06/22/1930 SUDHAM CHAND IMPRESSIONS  1. Left ventricular ejection fraction, by  estimation, is 60 to 65%. The left ventricle has normal function. The left ventricle has no regional wall motion abnormalities. There is severe asymmetric left ventricular hypertrophy of the basal-septal segment. Left ventricular diastolic parameters are indeterminate.  2. Right ventricular systolic function is normal. The right ventricular size is normal. Tricuspid regurgitation signal is inadequate for assessing PA pressure.  3. The mitral valve is normal in structure. Mild mitral valve regurgitation.  4. The aortic valve is tricuspid. Aortic valve regurgitation is mild to moderate. Mild aortic valve sclerosis is present, with no evidence of aortic valve stenosis.  5. Aortic dilatation noted. There is dilatation of the aortic root, measuring 45 mm. There is dilatation of the ascending aorta, measuring 43 mm.  6. The inferior vena cava is normal in size with <50% respiratory variability, suggesting right atrial pressure of 8 mmHg. FINDINGS  Left Ventricle: Left ventricular ejection fraction, by estimation, is 60 to 65%. The left ventricle has normal function. The left ventricle has no regional wall motion abnormalities. The left ventricular internal cavity size was normal in size. There is  severe asymmetric left ventricular hypertrophy of the basal-septal segment. Left ventricular diastolic parameters are indeterminate. Right Ventricle: The right ventricular  size is normal. No increase in right ventricular wall thickness. Right ventricular systolic function is normal. Tricuspid regurgitation signal is inadequate for assessing PA pressure. Left Atrium: Left atrial size was normal in size. Right Atrium: Right atrial size was normal in size. Pericardium: There is no evidence of pericardial effusion. Mitral Valve: The mitral valve is normal in structure. Mild mitral valve regurgitation. Tricuspid Valve: The tricuspid valve is not well visualized. Tricuspid valve regurgitation is not demonstrated. Aortic Valve: The  aortic valve is tricuspid. Aortic valve regurgitation is mild to moderate. Aortic regurgitation PHT measures 382 msec. Mild aortic valve sclerosis is present, with no evidence of aortic valve stenosis. Aortic valve mean gradient measures 3.0 mmHg. Aortic valve peak gradient measures 5.6 mmHg. Aortic valve area, by VTI measures 2.58 cm. Pulmonic Valve: The pulmonic valve was not well visualized. Pulmonic valve regurgitation is not visualized. Aorta: Aortic dilatation noted. There is dilatation of the aortic root, measuring 45 mm. There is dilatation of the ascending aorta, measuring 43 mm. Venous: The inferior vena cava is normal in size with less than 50% respiratory variability, suggesting right atrial pressure of 8 mmHg. IAS/Shunts: The interatrial septum was not well visualized.  LEFT VENTRICLE PLAX 2D LVIDd:         4.80 cm LVIDs:         3.10 cm LV PW:         1.30 cm LV IVS:        1.20 cm LVOT diam:     2.20 cm LV SV:         55 LV SV Index:   28 LVOT Area:     3.80 cm  RIGHT VENTRICLE RV Basal diam:  3.30 cm RV S prime:     10.30 cm/s TAPSE (M-mode): 2.2 cm LEFT ATRIUM           Index       RIGHT ATRIUM           Index LA diam:      4.20 cm 2.10 cm/m  RA Area:     21.90 cm LA Vol (A2C): 48.1 ml 24.05 ml/m RA Volume:   56.40 ml  28.20 ml/m LA Vol (A4C): 74.6 ml 37.30 ml/m  AORTIC VALVE AV Area (Vmax):    3.58 cm AV Area (Vmean):   3.45 cm AV Area (VTI):     2.58 cm AV Vmax:           118.00 cm/s AV Vmean:          81.400 cm/s AV VTI:            0.214 m AV Peak Grad:      5.6 mmHg AV Mean Grad:      3.0 mmHg LVOT Vmax:         111.00 cm/s LVOT Vmean:        73.900 cm/s LVOT VTI:          0.145 m LVOT/AV VTI ratio: 0.68 AI PHT:            382 msec  AORTA Ao Root diam: 4.50 cm  SHUNTS Systemic VTI:  0.14 m Systemic Diam: 2.20 cm Epifanio Lesches MD Electronically signed by Epifanio Lesches MD Signature Date/Time: 12/17/2020/5:01:56 PM    Final    CT RENAL STONE STUDY  Result Date:  12/18/2020 CLINICAL DATA:  UTI and hydronephrosis. EXAM: CT ABDOMEN AND PELVIS WITHOUT CONTRAST TECHNIQUE: Multidetector CT imaging of the abdomen and pelvis was performed following  the standard protocol without IV contrast. COMPARISON:  09/05/2020 FINDINGS: Lower chest: Enlarged heart. Moderate bilateral pleural effusions, with associated atelectasis. Ground-glass opacities in the right middle, right lower, and left lower lobes. Bronchial wall thickening. Hepatobiliary: No focal liver abnormality is seen. No gallstones, gallbladder wall thickening, or biliary dilatation. Pancreas: Unremarkable. No pancreatic ductal dilatation or surrounding inflammatory changes. Spleen: Normal in size without focal abnormality. Adrenals/Urinary Tract: Stable low-density right adrenal nodule. Normal left adrenal gland. The right kidney is normal in contour with no hydronephrosis or nephrolithiasis. The left kidney demonstrates mild hydronephrosis and mild ureterectasis. No stone is seen in the course of the ureters. The bladder contains a Foley and is mostly decompressed, with redemonstrated wall thickening. Small amount of air within the bladder, not unexpected post Foley insertion. Stomach/Bowel: Large hiatal hernia. No evidence of obstruction. Colonic diverticulosis without evidence of diverticulitis. Vascular/Lymphatic: Severe atherosclerotic calcifications in the aorta and its branch vessels. 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 No lymphadenopathy. Reproductive: The prostate is enlarged and indents upon the urinary bladder. Other: No free fluid or free air in the abdomen or pelvis. Musculoskeletal: Degenerative changes in the imaged thoracic and lumbar spine. Status post right femur intramedullary nail fixation. No acute osseous abnormality. IMPRESSION: 1. Mild left hydronephrosis, without evidence of obstructing stone. The bladder is mostly decompressed but again noted is bladder wall thickening, compatible  with chronic bladder outlet obstruction, which may be secondary to enlarged prostate. 2. Moderate bilateral pleural effusions, ground-glass opacities in the imaged lungs, and bronchial wall thickening, most likely pulmonary edema, but infectious component cannot be excluded. 3. Aortic atherosclerosis (ICD10-I70.0) and unchanged celiac artery aneurysm. Electronically Signed   By: Wiliam Ke M.D.   On: 12/18/2020 14:12   XR FEMUR, MIN 2 VIEWS RIGHT  Result Date: 12/03/2020 X-rays demonstrate bony consolidation of the fracture site.  No hardware complication.   Microbiology No results found for this or any previous visit (from the past 240 hour(s)).  Lab Basic Metabolic Panel: Recent Labs  Lab 12/22/20 1658 12/23/20 0238 12/24/20 0132 12/25/20 0308 12/26/20 0222 12/21/2020 0237  NA  --  149* 146* 144 144 147*  K  --  4.1 4.2 3.8 4.2 4.3  CL  --  121* 118* 114* 115* 112*  CO2  --  22 19* 23 22 24   GLUCOSE  --  126* 131* 125* 104* 129*  BUN  --  58* 48* 37* 36* 40*  CREATININE  --  1.50* 1.37* 1.16 1.06 1.10  CALCIUM  --  8.6* 8.5* 8.7* 8.7* 9.2  MG 2.3 2.3  --  2.0 2.0 2.0  PHOS 3.8 3.6  --  3.5 3.5 4.2   Liver Function Tests: Recent Labs  Lab 12/22/20 0438 12/23/20 0238 12/24/20 0132 12/25/20 0308  AST 22 20 16 17   ALT 22 24 24 25   ALKPHOS 43 38 38 43  BILITOT 1.0 0.8 1.0 0.6  PROT 6.0* 5.9* 5.8* 6.1*  ALBUMIN 2.4* 2.4* 2.3* 2.4*   No results for input(s): LIPASE, AMYLASE in the last 168 hours. No results for input(s): AMMONIA in the last 168 hours. CBC: Recent Labs  Lab 12/22/20 0438 12/23/20 0238 12/24/20 0132 12/25/20 0308 12/26/20 0222 12/04/2020 0237  WBC 19.2* 18.1* 14.9* 16.5* 21.5* 30.4*  NEUTROABS 16.7* 14.6* 12.1* 13.8*  --   --   HGB 8.9* 8.8* 9.5* 9.5* 9.8* 10.5*  HCT 29.3* 29.6* 32.4* 32.3* 32.1* 35.4*  MCV 90.2 90.5 93.1 91.0 90.2 93.4  PLT  348 372 310 343 358 370   Cardiac Enzymes: No results for input(s): CKTOTAL, CKMB, CKMBINDEX, TROPONINI in  the last 168 hours. Sepsis Labs: Recent Labs  Lab 12/23/20 0238 12/24/20 0132 12/25/20 0308 12/26/20 0222 12/23/2020 0237  PROCALCITON <0.10 <0.10 <0.10  --   --   WBC 18.1* 14.9* 16.5* 21.5* 30.4*    Procedures/Operations  Cortrak tube tube placement.   Anila Bojarski 12/28/2020, 3:07 PM

## 2020-12-31 NOTE — Plan of Care (Signed)

## 2020-12-31 NOTE — Progress Notes (Addendum)
Palliative Medicine Inpatient Follow Up Note  Consulting Provider: Flora Lipps, MD   Reason for consult:   Palliative Care Consult Services Palliative Medicine Consult    Symptom Management Consult  Reason for Consult? goals of care    HPI:  Per intake H&P --> 87 y/o male who presented to to the emergency department with altered mental status, fatigue, poor oral intake.  Patient was noted to be having dyspnea and wheezing.  Patient does have a chronic Foley from recent admission and was started on Keflex for UTI on 12/11/2020.  CT head showed stable left subdural hematoma.  Patient was started on antibiotic for possible sepsis and  zyprexa for delirium.  Patient was in the hospital in June 2022 for femur fracture and subdural hematoma.   Palliative care has been asked to get involved to further aid in goals of care conversations in the setting of multiple co-morbid conditions.  Today's Discussion (01/15/2021):  *Please note that this is a verbal dictation therefore any spelling or grammatical errors are due to the "[place] [employer]" system interpretation.  Chart reviewed.  It appears that overnight patients received x2 doses of haldol which has had a paradoxical effect on the patient.   I have met at bedside with the patients spouse, Perrin Smack and son, Linna Hoff at bedside. Three grandchildren were present as well.   The patient was noted to be writhing in the bed in terribly distress. Per RN, Amrah he had been distressed since his last dose of hydromorphone wore off. He has audible secretions and is tachypneic.  I shared with the patients spouse and son that I do think now is the time to consider comfort oriented care. We talked about transition to comfort measures in house and what that would entail inclusive of medications to control pain, dyspnea, agitation, nausea, itching, and hiccups.   We discussed stopping all uneccessary measures such as antibiotics, cardiac monitoring, NGT  feeding, blood draws, needle sticks, and frequent vital signs.   Patients son spoke over the phone with his three sisters. Everyone has decided to change the emphasis of care to comfort.  I coordinated with Amrah and had entered some around the clock medications to further relieve symptom burden.  Emotional support provided to family through therapeutic listening.   Chaplain comfort requested.  Questions and concerns addressed   Objective Assessment: Vital Signs Vitals:   Jan 15, 2021 0853 01-15-21 1258  BP:    Pulse:  (!) 133  Resp:  (!) 29  Temp:  97.8 F (36.6 C)  SpO2: 95% 98%    Intake/Output Summary (Last 24 hours) at Jan 15, 2021 1322 Last data filed at Jan 15, 2021 5726 Gross per 24 hour  Intake 820 ml  Output 1100 ml  Net -280 ml   Last Weight  Most recent update: 12/25/2020  8:58 AM    Weight  78.1 kg (172 lb 2.9 oz)            Gen:  Chronically ill appearing elderly M in moderate distress HEENT: Core[place] in place, moist mucous membranes CV: Irregular rate and rhythm  PULM: Tachypneic, On 4LPM [place], (+) crackles ABD: soft/nontender  EXT: (+) ecchymosis Neuro: Disoriented, writhing  SUMMARY OF RECOMMENDATIONS   DNAR/DNI  DC Cardiac monitor  DC NGT  Transition focus to comfort care  PRN medications per MAR --> Dilaudid and ativan ATC  Appreciate Chaplain support  Unrestricted visitation  Ongoing PMT support  Time Spent: 80 Greater than 50% of the time was spent in counseling  and coordination of care ______________________________________________________________________________________ [place] Team Team Cell Phone: [phone] Please utilize secure chat with additional questions, if there is no response within 30 minutes please call the above phone number  Palliative Medicine Team providers are available by phone from 7am to 7pm daily and can be reached through the team cell phone.  Should this patient  require assistance outside of these hours, please call the patient's attending physician.

## 2020-12-31 NOTE — Progress Notes (Signed)
Wasted dilaudid drip . 2 RN Hilaria Ota and Kenly Xiao P wasted in stericycle.

## 2020-12-31 DEATH — deceased

## 2021-01-08 ENCOUNTER — Ambulatory Visit: Admit: 2021-01-08 | Payer: Medicare PPO | Admitting: Urology

## 2021-01-08 SURGERY — TURP (TRANSURETHRAL RESECTION OF PROSTATE)
Anesthesia: General

## 2021-01-13 ENCOUNTER — Encounter: Payer: Medicare PPO | Admitting: Orthopaedic Surgery

## 2022-08-22 IMAGING — CT CT RENAL STONE PROTOCOL
2 of 4 series · 16 of 46 positions shown, 18 images · non-contrast
Comparison: Abdomen CT dated 11/26/2018 and abdomen and pelvis CT
dated 04/27/2018.

CLINICAL DATA: Difficulty urinating since yesterday.

EXAM:
CT ABDOMEN AND PELVIS WITHOUT CONTRAST
TECHNIQUE: Multidetector CT imaging of the abdomen and pelvis was performed
following the standard protocol without IV contrast.

[Series 2: stone full · axial · 0.73mm/px · z∈[+744,+1149]mm · 13 of 89 slices shown, 15 images]
[im 4/89  soft-tissue]
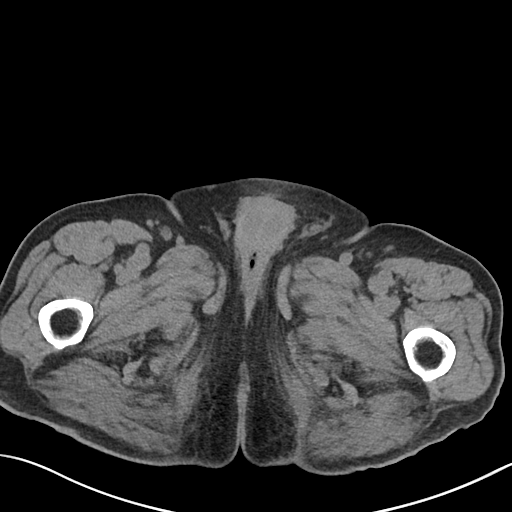
[im 4/89  bone]
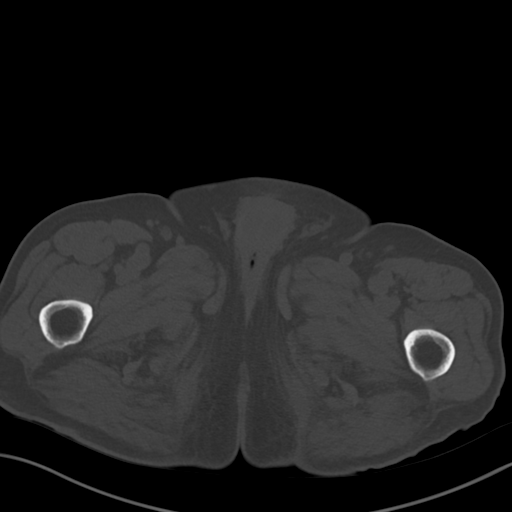
[im 12/89  soft-tissue]
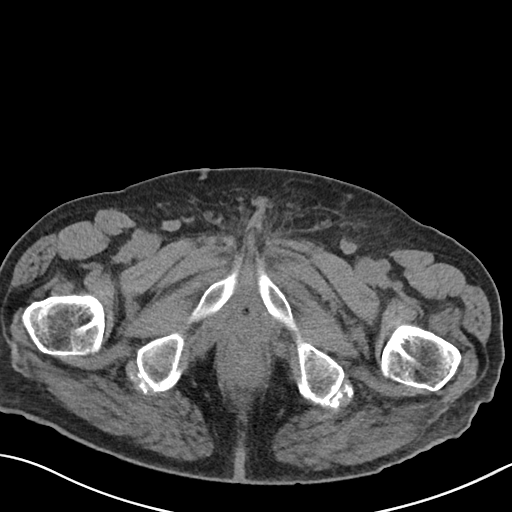
[im 20/89  soft-tissue]
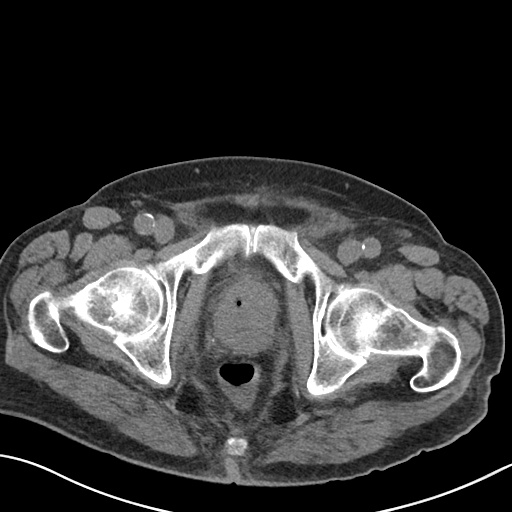
[im 23/89  soft-tissue]
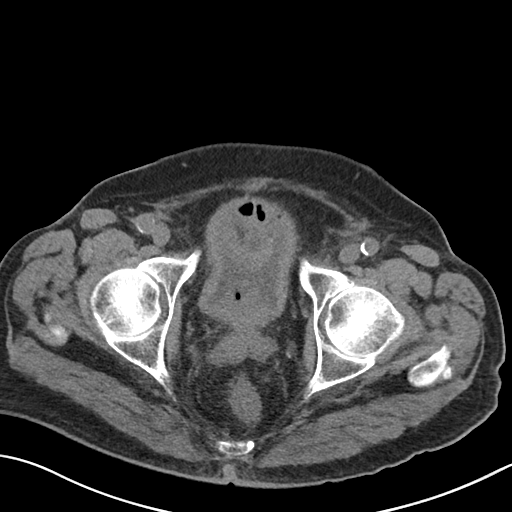
[im 31/89  soft-tissue]
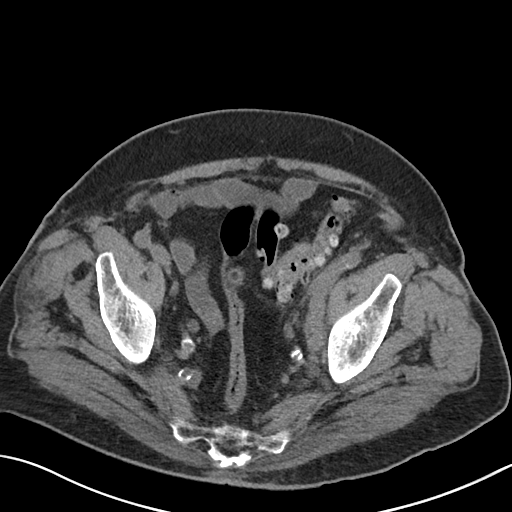
[im 39/89  soft-tissue]
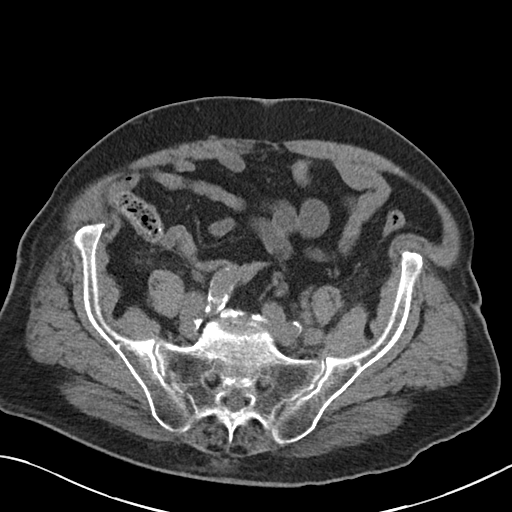
[im 46/89  soft-tissue]
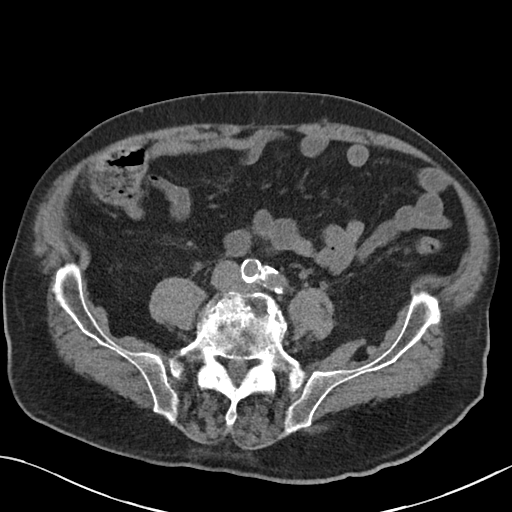
[im 50/89  soft-tissue]
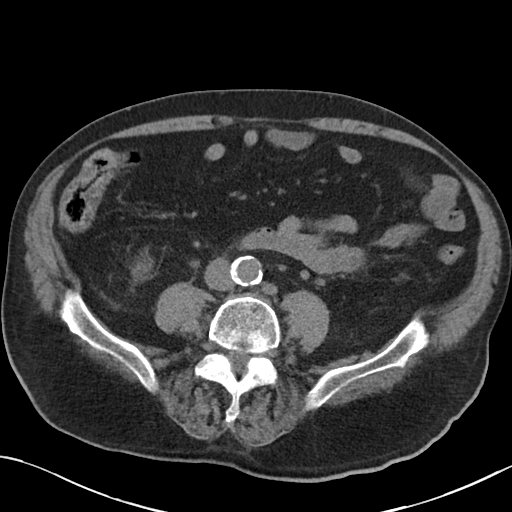
[im 58/89  soft-tissue]
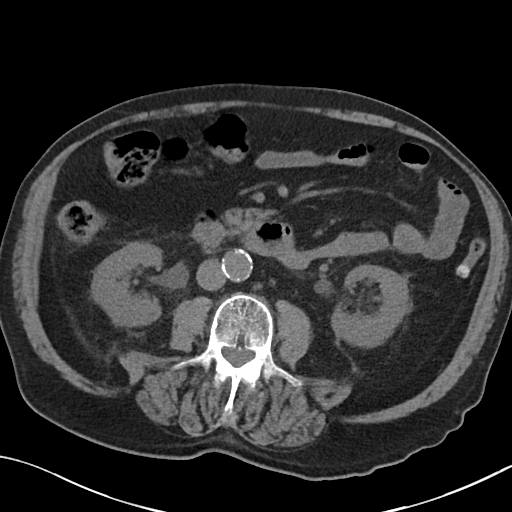
[im 58/89  bone]
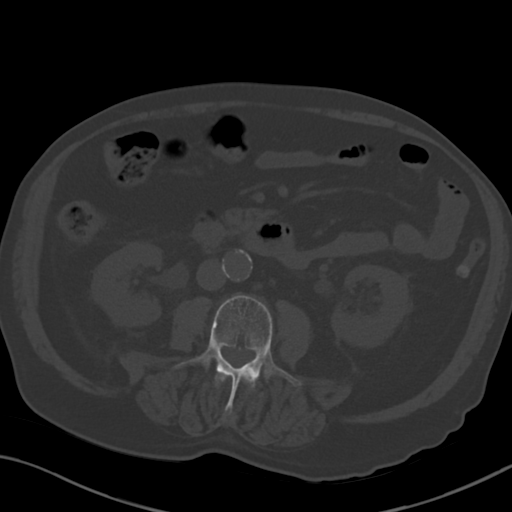
[im 66/89  soft-tissue]
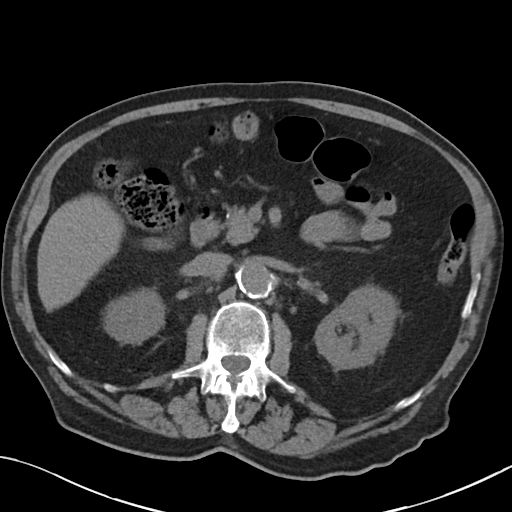
[im 69/89  soft-tissue]
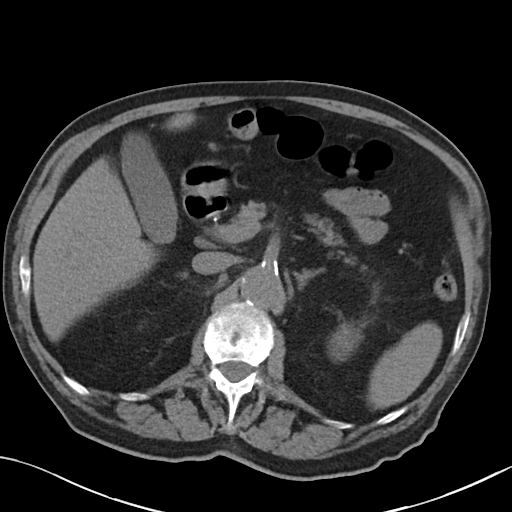
[im 77/89  soft-tissue]
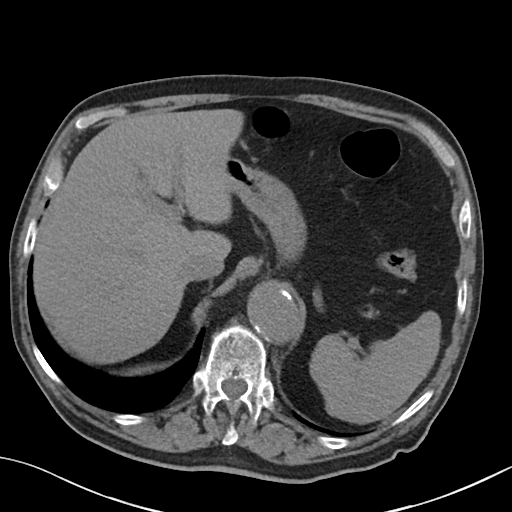
[im 85/89  soft-tissue]
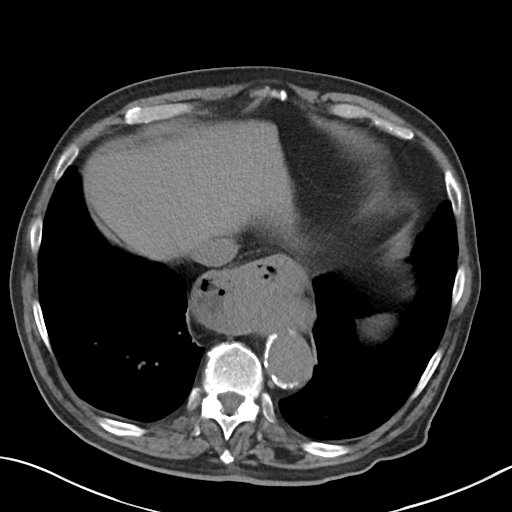

[Series 5: coronal · coronal · 0.79mm/px · 3 of 105 slices shown]
[im 35/105  soft-tissue]
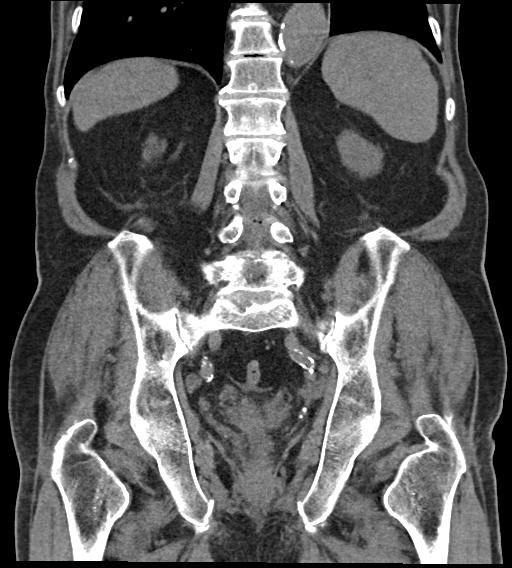
[im 47/105  soft-tissue]
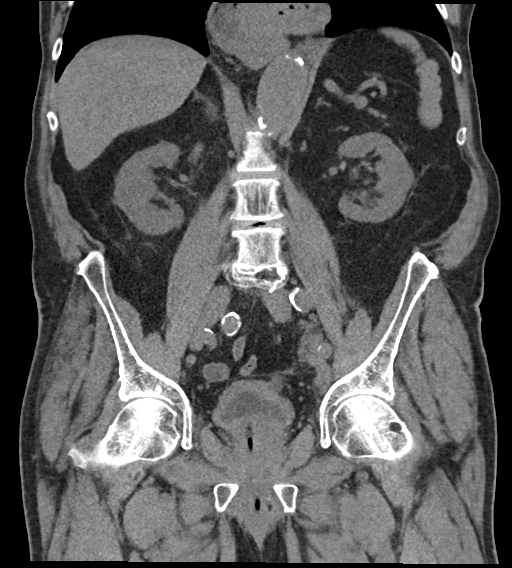
[im 58/105  soft-tissue]
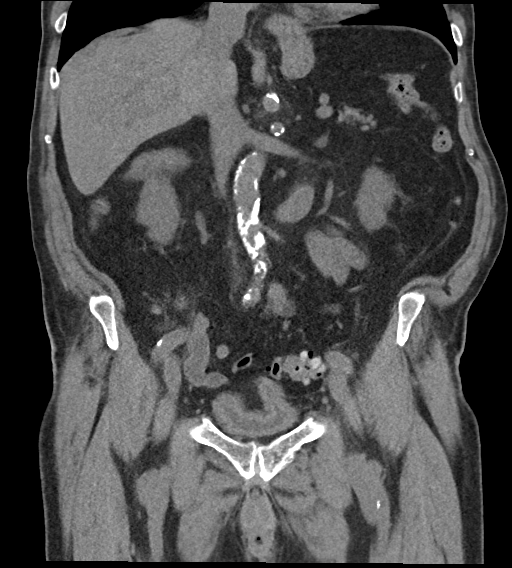

[16 of 46 positions shown; findings below may reference images not displayed]

FINDINGS: Lower chest: Stable enlarged heart. Clear lung bases. The previously
demonstrated patchy nodular appearance to the in the left lower lobe
is not currently included. Stable large hiatal hernia.

Hepatobiliary: No focal liver abnormality is seen. No gallstones,
gallbladder wall thickening, or biliary dilatation.

Pancreas: Unremarkable. No pancreatic ductal dilatation or
surrounding inflammatory changes.

Spleen: Normal in size without focal abnormality.

Adrenals/Urinary Tract: Stable low density right adrenal nodule.
Normal appearing left adrenal gland. Unremarkable kidneys and
ureters. Foley catheter in the urinary bladder with a small amount
of air and urine in the bladder. Moderate diffuse bladder wall
thickening, similar to that seen on 04/27/2018. No urinary tract
calculi seen.

Stomach/Bowel: Large hiatal hernia. Multiple sigmoid and descending
colon diverticula. No evidence of diverticulitis or appendicitis.
Unremarkable small bowel.

Vascular/Lymphatic: Atheromatous arterial calcifications. Stable
celiac artery aneurysm with a maximum diameter of 14.5 mm. No
enlarged lymph nodes.

Reproductive: Moderately enlarged prostate gland protruding into the
base of the urinary bladder.

Other: Small umbilical and left inguinal hernias containing fat.
There is also a small portion of a small bowel loop extending into
the umbilical hernia without signs of obstruction.

Musculoskeletal: Lumbar and lower thoracic spine degenerative
changes.
IMPRESSION: 1. No urinary tract calculi or obstruction.
2. Moderate prostatic hypertrophy.
3. Moderate chronic bladder wall thickening, compatible with chronic
bladder outlet obstruction by the enlarged prostate gland.
4. Stable large hiatal hernia.
5. Sigmoid and descending colon diverticulosis.
6. Stable right adrenal adenoma.
7. Stable 14.5 cm celiac artery aneurysm.

## 2022-11-19 IMAGING — CT CT HEAD W/O CM
1 series · 15 of 30 positions shown, 19 images · non-contrast
Comparison: CT head 10/08/2020

CLINICAL DATA: Follow-up subdural hematoma.

EXAM:
CT HEAD WITHOUT CONTRAST
TECHNIQUE: Contiguous axial images were obtained from the base of the skull
through the vertex without intravenous contrast.

[Series 2: head w/(date) · axial · 0.49mm/px · z∈[+1103,+1263]mm · 15 of 36 slices shown, 19 images]
[im 2/36  brain]
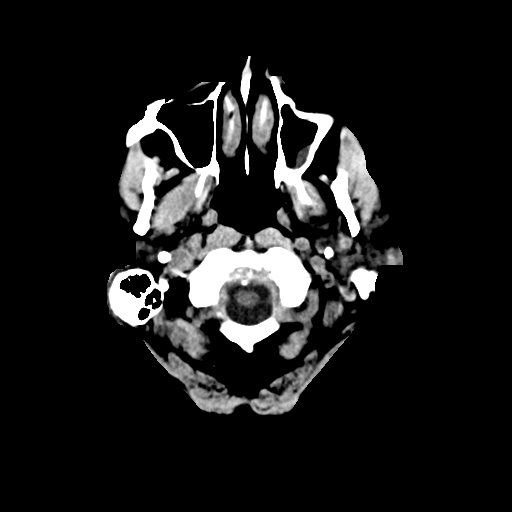
[im 2/36  bone]
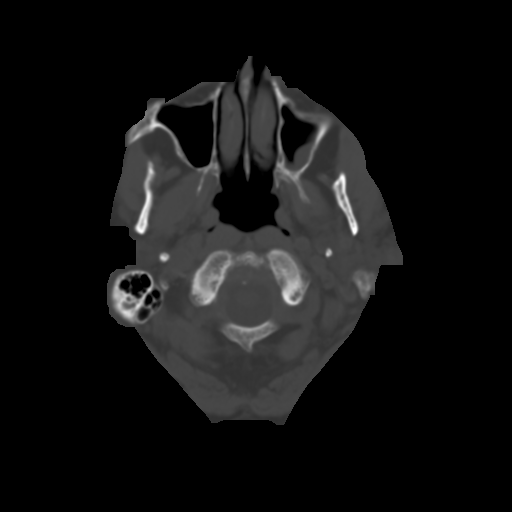
[im 4/36  brain]
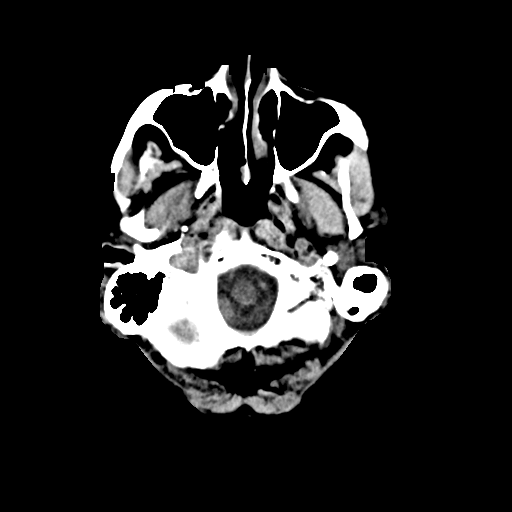
[im 7/36  brain]
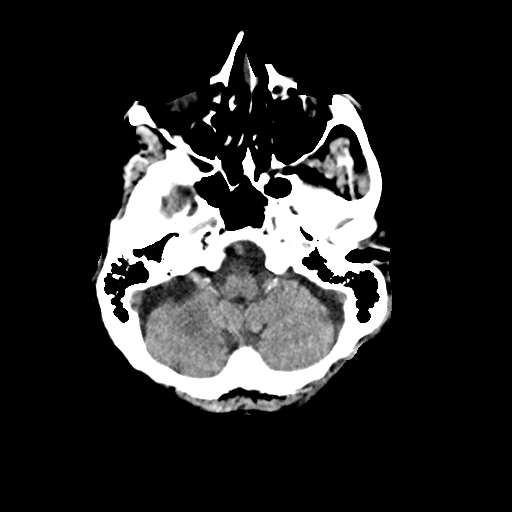
[im 9/36  brain]
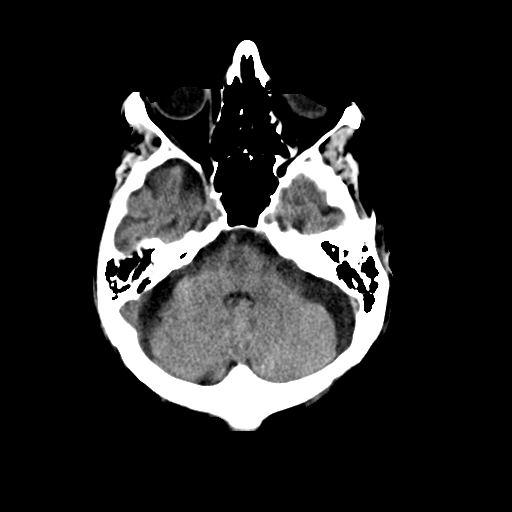
[im 11/36  brain]
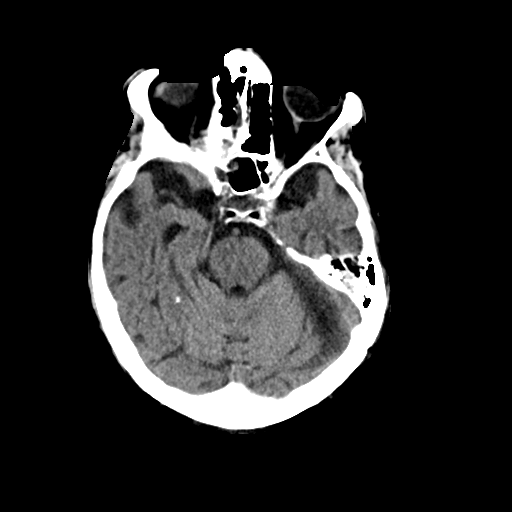
[im 11/36  bone]
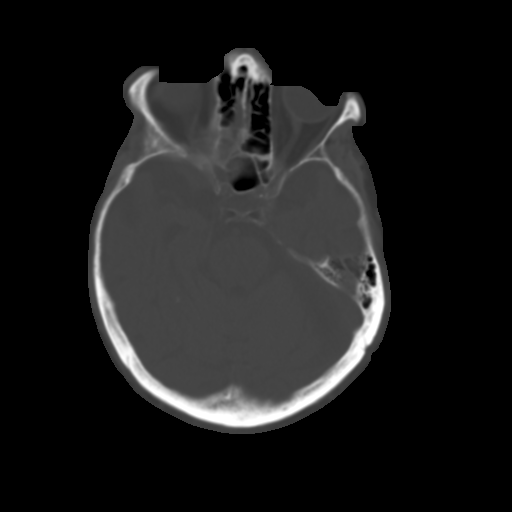
[im 14/36  brain]
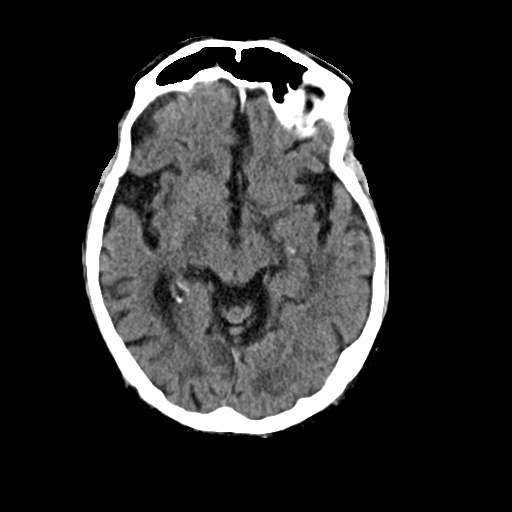
[im 16/36  brain]
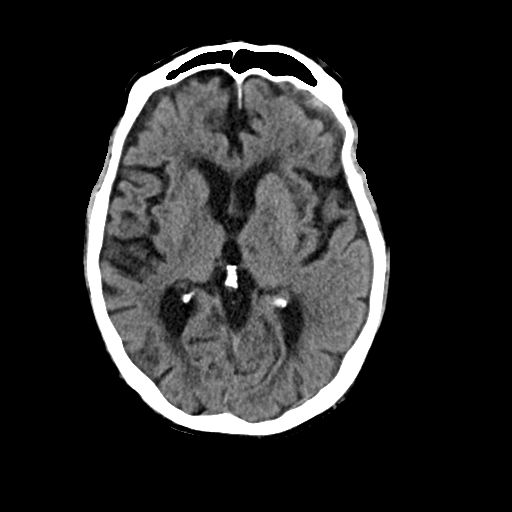
[im 19/36  brain]
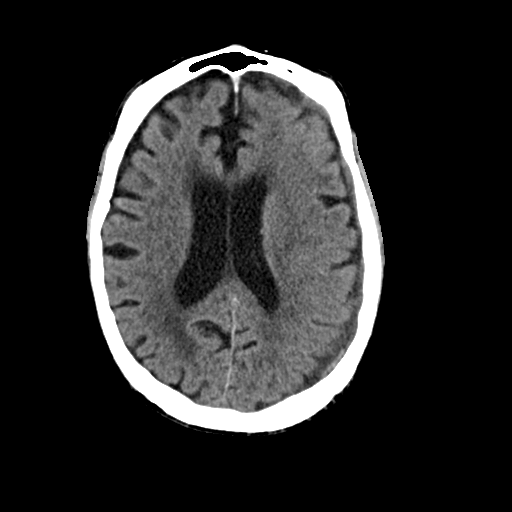
[im 20/36  brain]
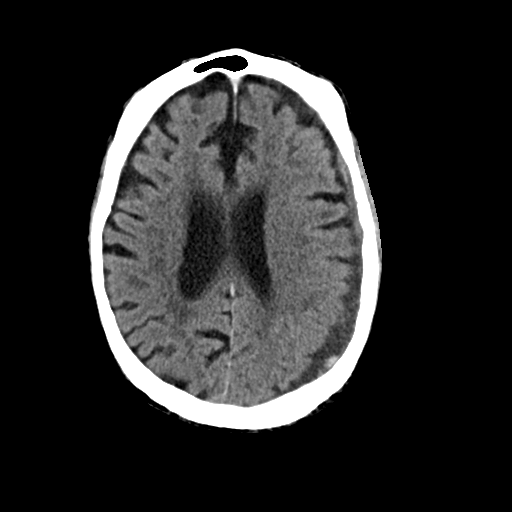
[im 20/36  bone]
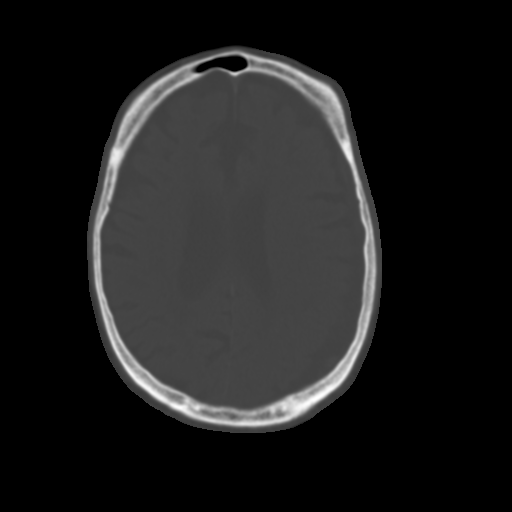
[im 22/36  brain]
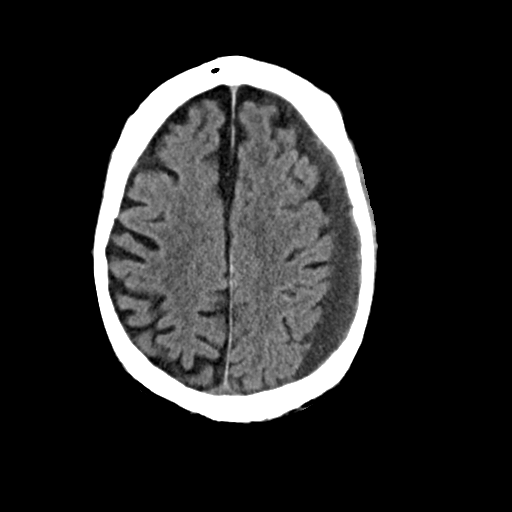
[im 25/36  brain]
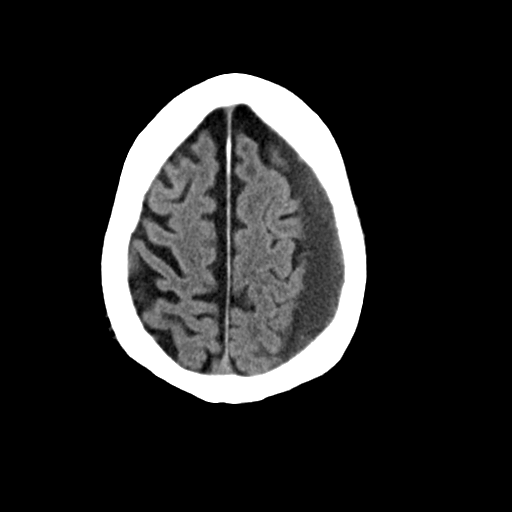
[im 27/36  brain]
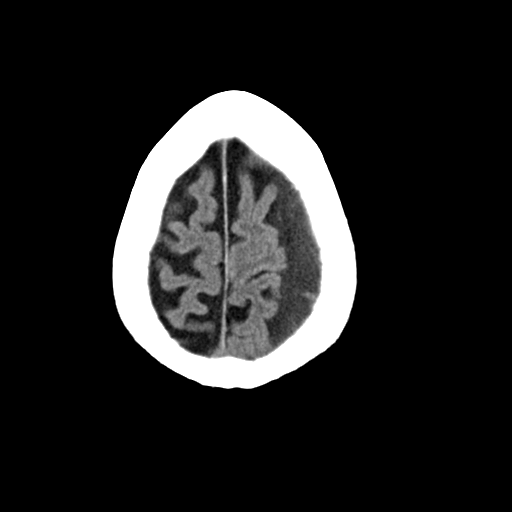
[im 29/36  brain]
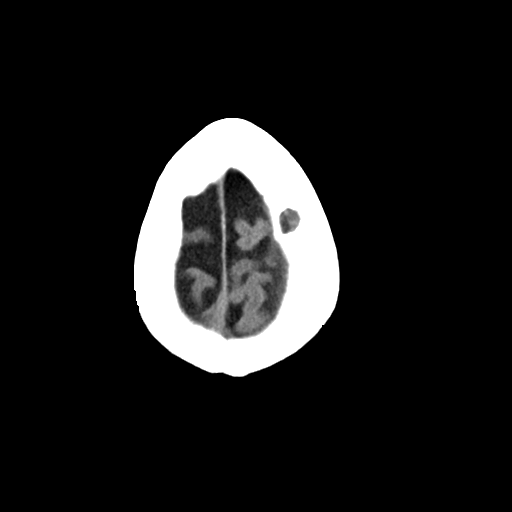
[im 29/36  bone]
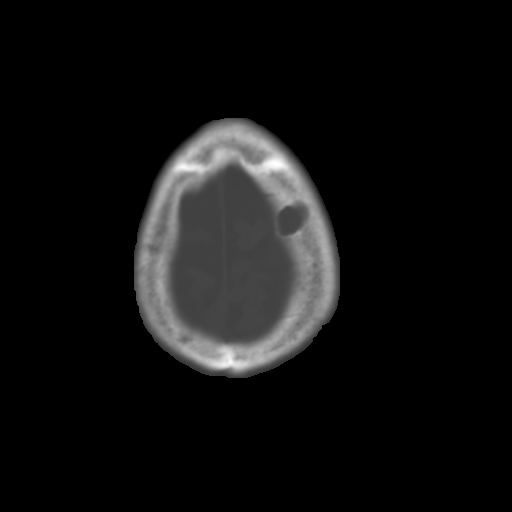
[im 32/36  brain]
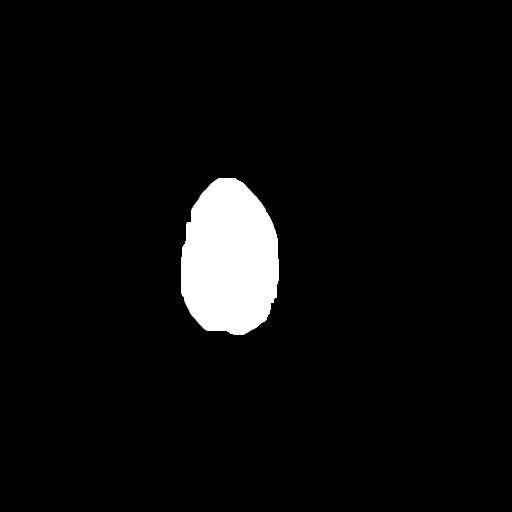
[im 34/36  brain]
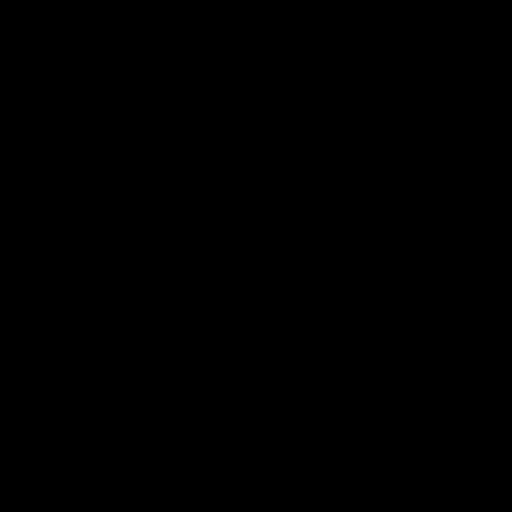

[15 of 30 positions shown; findings below may reference images not displayed]

FINDINGS: Brain: Mixed density subdural hematoma on the left has improved
since the prior study. This now measures 13 mm in thickness and has
predominately low density blood products and fluid. Small areas of
intermediate density. No new hemorrhage since the prior study.
Improved mass-effect. Decreased midline shift which is now
approximately 4 mm.

Generalized atrophy. Chronic microvascular ischemic changes
throughout the white matter and left thalamus. No acute infarct or
mass.

Vascular: Negative for hyperdense vessel

Skull: Left frontal burr hole.

Sinuses/Orbits: Mild mucosal edema paranasal sinuses. Bilateral
cataract extraction.

Other: None
IMPRESSION: Mixed density subdural hematoma on the left has improved now
measuring 13 mm in thickness. Improvement in midline shift. No new
hemorrhage since the prior CT.
